# Patient Record
Sex: Female | Born: 1950 | Race: Black or African American | Hispanic: No | Smoking: Never smoker
Health system: Southern US, Community
[De-identification: ages and names within clinical notes are randomized; demographics above are authoritative.]

## PROBLEM LIST (undated history)

## (undated) DIAGNOSIS — E785 Hyperlipidemia, unspecified: Secondary | ICD-10-CM

## (undated) DIAGNOSIS — K219 Gastro-esophageal reflux disease without esophagitis: Secondary | ICD-10-CM

## (undated) DIAGNOSIS — G709 Myoneural disorder, unspecified: Secondary | ICD-10-CM

## (undated) DIAGNOSIS — G4730 Sleep apnea, unspecified: Secondary | ICD-10-CM

## (undated) DIAGNOSIS — E119 Type 2 diabetes mellitus without complications: Secondary | ICD-10-CM

## (undated) DIAGNOSIS — I10 Essential (primary) hypertension: Secondary | ICD-10-CM

## (undated) DIAGNOSIS — R51 Headache: Secondary | ICD-10-CM

## (undated) DIAGNOSIS — Z8719 Personal history of other diseases of the digestive system: Secondary | ICD-10-CM

## (undated) DIAGNOSIS — M1990 Unspecified osteoarthritis, unspecified site: Secondary | ICD-10-CM

## (undated) HISTORY — PX: CHOLECYSTECTOMY: SHX55

## (undated) HISTORY — PX: BACK SURGERY: SHX140

## (undated) HISTORY — DX: Myoneural disorder, unspecified: G70.9

## (undated) HISTORY — PX: COLONOSCOPY: SHX174

## (undated) HISTORY — PX: ABDOMINAL HYSTERECTOMY: SHX81

## (undated) HISTORY — PX: TONSILLECTOMY: SUR1361

## (undated) HISTORY — DX: Hyperlipidemia, unspecified: E78.5

## (undated) HISTORY — PX: OTHER SURGICAL HISTORY: SHX169

---

## 1997-12-12 ENCOUNTER — Inpatient Hospital Stay (HOSPITAL_COMMUNITY): Admission: RE | Admit: 1997-12-12 | Discharge: 1997-12-13 | Payer: Self-pay | Admitting: Obstetrics & Gynecology

## 1999-02-23 ENCOUNTER — Emergency Department (HOSPITAL_COMMUNITY): Admission: EM | Admit: 1999-02-23 | Discharge: 1999-02-23 | Payer: Self-pay | Admitting: Emergency Medicine

## 1999-02-23 ENCOUNTER — Encounter: Payer: Self-pay | Admitting: Surgery

## 2000-10-29 ENCOUNTER — Ambulatory Visit (HOSPITAL_COMMUNITY): Admission: RE | Admit: 2000-10-29 | Discharge: 2000-10-29 | Payer: Self-pay | Admitting: *Deleted

## 2000-11-10 ENCOUNTER — Encounter: Payer: Self-pay | Admitting: Emergency Medicine

## 2000-11-10 ENCOUNTER — Emergency Department (HOSPITAL_COMMUNITY): Admission: EM | Admit: 2000-11-10 | Discharge: 2000-11-10 | Payer: Self-pay | Admitting: Emergency Medicine

## 2001-04-06 ENCOUNTER — Ambulatory Visit (HOSPITAL_BASED_OUTPATIENT_CLINIC_OR_DEPARTMENT_OTHER): Admission: RE | Admit: 2001-04-06 | Discharge: 2001-04-06 | Payer: Self-pay | Admitting: Orthopaedic Surgery

## 2001-05-05 ENCOUNTER — Encounter: Payer: Self-pay | Admitting: Family Medicine

## 2001-05-05 ENCOUNTER — Encounter: Admission: RE | Admit: 2001-05-05 | Discharge: 2001-05-05 | Payer: Self-pay | Admitting: Family Medicine

## 2001-05-26 ENCOUNTER — Encounter: Admission: RE | Admit: 2001-05-26 | Discharge: 2001-08-24 | Payer: Self-pay | Admitting: Family Medicine

## 2001-11-30 ENCOUNTER — Ambulatory Visit (HOSPITAL_BASED_OUTPATIENT_CLINIC_OR_DEPARTMENT_OTHER): Admission: RE | Admit: 2001-11-30 | Discharge: 2001-11-30 | Payer: Self-pay | Admitting: Orthopaedic Surgery

## 2002-09-06 ENCOUNTER — Other Ambulatory Visit: Admission: RE | Admit: 2002-09-06 | Discharge: 2002-09-06 | Payer: Self-pay | Admitting: Family Medicine

## 2002-09-08 ENCOUNTER — Encounter: Payer: Self-pay | Admitting: Family Medicine

## 2002-09-08 ENCOUNTER — Encounter: Admission: RE | Admit: 2002-09-08 | Discharge: 2002-09-08 | Payer: Self-pay | Admitting: Family Medicine

## 2002-11-03 ENCOUNTER — Encounter: Admission: RE | Admit: 2002-11-03 | Discharge: 2002-11-03 | Payer: Self-pay | Admitting: Family Medicine

## 2002-11-03 ENCOUNTER — Encounter: Payer: Self-pay | Admitting: Family Medicine

## 2002-12-20 ENCOUNTER — Encounter: Admission: RE | Admit: 2002-12-20 | Discharge: 2003-02-21 | Payer: Self-pay

## 2003-04-08 ENCOUNTER — Emergency Department (HOSPITAL_COMMUNITY): Admission: EM | Admit: 2003-04-08 | Discharge: 2003-04-08 | Payer: Self-pay | Admitting: Emergency Medicine

## 2003-09-06 ENCOUNTER — Other Ambulatory Visit: Admission: RE | Admit: 2003-09-06 | Discharge: 2003-09-06 | Payer: Self-pay | Admitting: Family Medicine

## 2004-10-25 ENCOUNTER — Other Ambulatory Visit: Admission: RE | Admit: 2004-10-25 | Discharge: 2004-10-25 | Payer: Self-pay | Admitting: Family Medicine

## 2007-02-01 ENCOUNTER — Other Ambulatory Visit: Admission: RE | Admit: 2007-02-01 | Discharge: 2007-02-01 | Payer: Self-pay | Admitting: Family Medicine

## 2007-02-18 ENCOUNTER — Encounter: Admission: RE | Admit: 2007-02-18 | Discharge: 2007-02-18 | Payer: Self-pay | Admitting: Family Medicine

## 2008-03-24 ENCOUNTER — Encounter: Admission: RE | Admit: 2008-03-24 | Discharge: 2008-03-24 | Payer: Self-pay | Admitting: Neurosurgery

## 2008-07-20 ENCOUNTER — Inpatient Hospital Stay (HOSPITAL_COMMUNITY): Admission: RE | Admit: 2008-07-20 | Discharge: 2008-07-23 | Payer: Self-pay | Admitting: Neurosurgery

## 2008-12-26 ENCOUNTER — Encounter: Admission: RE | Admit: 2008-12-26 | Discharge: 2009-03-26 | Payer: Self-pay | Admitting: Neurosurgery

## 2009-07-06 ENCOUNTER — Ambulatory Visit (HOSPITAL_BASED_OUTPATIENT_CLINIC_OR_DEPARTMENT_OTHER): Admission: RE | Admit: 2009-07-06 | Discharge: 2009-07-06 | Payer: Self-pay | Admitting: Ophthalmology

## 2009-08-13 ENCOUNTER — Emergency Department (HOSPITAL_COMMUNITY): Admission: EM | Admit: 2009-08-13 | Discharge: 2009-08-13 | Payer: Self-pay | Admitting: Family Medicine

## 2010-07-07 ENCOUNTER — Encounter: Payer: Self-pay | Admitting: Family Medicine

## 2010-08-20 ENCOUNTER — Other Ambulatory Visit: Payer: Self-pay | Admitting: Internal Medicine

## 2010-08-20 DIAGNOSIS — Z1231 Encounter for screening mammogram for malignant neoplasm of breast: Secondary | ICD-10-CM

## 2010-08-29 ENCOUNTER — Ambulatory Visit: Payer: Self-pay

## 2010-09-30 LAB — CBC
HCT: 32.8 % — ABNORMAL LOW (ref 36.0–46.0)
Hemoglobin: 11.1 g/dL — ABNORMAL LOW (ref 12.0–15.0)
MCHC: 33.9 g/dL (ref 30.0–36.0)
MCV: 88.3 fL (ref 78.0–100.0)
Platelets: 212 10*3/uL (ref 150–400)
RBC: 3.72 MIL/uL — ABNORMAL LOW (ref 3.87–5.11)
RDW: 12.9 % (ref 11.5–15.5)
WBC: 8.2 10*3/uL (ref 4.0–10.5)

## 2010-09-30 LAB — BASIC METABOLIC PANEL
BUN: 23 mg/dL (ref 6–23)
CO2: 25 mEq/L (ref 19–32)
Calcium: 9.7 mg/dL (ref 8.4–10.5)
Chloride: 101 mEq/L (ref 96–112)
Creatinine, Ser: 1.23 mg/dL — ABNORMAL HIGH (ref 0.4–1.2)
GFR calc Af Amer: 55 mL/min — ABNORMAL LOW (ref >60)
GFR calc non Af Amer: 45 mL/min — ABNORMAL LOW (ref >60)
Glucose, Bld: 112 mg/dL — ABNORMAL HIGH (ref 70–99)
Potassium: 4.1 mEq/L (ref 3.5–5.1)
Sodium: 136 mEq/L (ref 135–145)

## 2010-09-30 LAB — TYPE AND SCREEN
ABO/RH(D): B POS
Antibody Screen: NEGATIVE

## 2010-09-30 LAB — ABO/RH: ABO/RH(D): B POS

## 2010-10-01 LAB — GLUCOSE, CAPILLARY
Glucose-Capillary: 115 mg/dL — ABNORMAL HIGH (ref 70–99)
Glucose-Capillary: 118 mg/dL — ABNORMAL HIGH (ref 70–99)
Glucose-Capillary: 127 mg/dL — ABNORMAL HIGH (ref 70–99)
Glucose-Capillary: 131 mg/dL — ABNORMAL HIGH (ref 70–99)
Glucose-Capillary: 136 mg/dL — ABNORMAL HIGH (ref 70–99)
Glucose-Capillary: 150 mg/dL — ABNORMAL HIGH (ref 70–99)
Glucose-Capillary: 169 mg/dL — ABNORMAL HIGH (ref 70–99)
Glucose-Capillary: 182 mg/dL — ABNORMAL HIGH (ref 70–99)
Glucose-Capillary: 66 mg/dL — ABNORMAL LOW (ref 70–99)
Glucose-Capillary: 71 mg/dL (ref 70–99)
Glucose-Capillary: 89 mg/dL (ref 70–99)
Glucose-Capillary: 90 mg/dL (ref 70–99)
Glucose-Capillary: 93 mg/dL (ref 70–99)
Glucose-Capillary: 94 mg/dL (ref 70–99)
Glucose-Capillary: 95 mg/dL (ref 70–99)

## 2010-10-01 LAB — CROSSMATCH
ABO/RH(D): B POS
Antibody Screen: NEGATIVE

## 2010-10-01 LAB — CBC
HCT: 23.1 % — ABNORMAL LOW (ref 36.0–46.0)
HCT: 28.1 % — ABNORMAL LOW (ref 36.0–46.0)
Hemoglobin: 8 g/dL — ABNORMAL LOW (ref 12.0–15.0)
Hemoglobin: 9.6 g/dL — ABNORMAL LOW (ref 12.0–15.0)
MCHC: 34.3 g/dL (ref 30.0–36.0)
MCHC: 34.5 g/dL (ref 30.0–36.0)
MCV: 86.5 fL (ref 78.0–100.0)
MCV: 89.9 fL (ref 78.0–100.0)
Platelets: 117 10*3/uL — ABNORMAL LOW (ref 150–400)
Platelets: 129 10*3/uL — ABNORMAL LOW (ref 150–400)
RBC: 2.58 MIL/uL — ABNORMAL LOW (ref 3.87–5.11)
RBC: 3.26 MIL/uL — ABNORMAL LOW (ref 3.87–5.11)
RDW: 12.9 % (ref 11.5–15.5)
RDW: 15.4 % (ref 11.5–15.5)
WBC: 11.2 10*3/uL — ABNORMAL HIGH (ref 4.0–10.5)
WBC: 6 10*3/uL (ref 4.0–10.5)

## 2010-10-01 LAB — POCT I-STAT 7, (LYTES, BLD GAS, ICA,H+H)
Acid-base deficit: 2 mmol/L (ref 0.0–2.0)
Acid-base deficit: 3 mmol/L — ABNORMAL HIGH (ref 0.0–2.0)
Bicarbonate: 23.1 mEq/L (ref 20.0–24.0)
Bicarbonate: 24 mEq/L (ref 20.0–24.0)
Calcium, Ion: 1.19 mmol/L (ref 1.12–1.32)
Calcium, Ion: 1.25 mmol/L (ref 1.12–1.32)
HCT: 23 % — ABNORMAL LOW (ref 36.0–46.0)
HCT: 23 % — ABNORMAL LOW (ref 36.0–46.0)
Hemoglobin: 7.8 g/dL — CL (ref 12.0–15.0)
Hemoglobin: 7.8 g/dL — CL (ref 12.0–15.0)
O2 Saturation: 100 %
O2 Saturation: 100 %
Patient temperature: 35.9
Patient temperature: 35.9
Potassium: 4.5 mEq/L (ref 3.5–5.1)
Potassium: 4.7 mEq/L (ref 3.5–5.1)
Sodium: 135 mEq/L (ref 135–145)
Sodium: 136 mEq/L (ref 135–145)
TCO2: 24 mmol/L (ref 0–100)
TCO2: 25 mmol/L (ref 0–100)
pCO2 arterial: 41.5 mmHg (ref 35.0–45.0)
pCO2 arterial: 42.4 mmHg (ref 35.0–45.0)
pH, Arterial: 7.349 — ABNORMAL LOW (ref 7.350–7.400)
pH, Arterial: 7.357 (ref 7.350–7.400)
pO2, Arterial: 410 mmHg — ABNORMAL HIGH (ref 80.0–100.0)
pO2, Arterial: 474 mmHg — ABNORMAL HIGH (ref 80.0–100.0)

## 2010-10-01 LAB — POCT I-STAT, CHEM 8
BUN: 31 mg/dL — ABNORMAL HIGH (ref 6–23)
Calcium, Ion: 1.2 mmol/L (ref 1.12–1.32)
Chloride: 105 mEq/L (ref 96–112)
Creatinine, Ser: 1 mg/dL (ref 0.4–1.2)
Glucose, Bld: 133 mg/dL — ABNORMAL HIGH (ref 70–99)
HCT: 20 % — ABNORMAL LOW (ref 36.0–46.0)
Hemoglobin: 6.8 g/dL — CL (ref 12.0–15.0)
Potassium: 4.7 mEq/L (ref 3.5–5.1)
Sodium: 136 mEq/L (ref 135–145)
TCO2: 23 mmol/L (ref 0–100)

## 2010-10-01 LAB — DIFFERENTIAL
Basophils Absolute: 0 10*3/uL (ref 0.0–0.1)
Basophils Relative: 0 % (ref 0–1)
Eosinophils Absolute: 0 10*3/uL (ref 0.0–0.7)
Eosinophils Relative: 0 % (ref 0–5)
Lymphocytes Relative: 17 % (ref 12–46)
Lymphs Abs: 1 10*3/uL (ref 0.7–4.0)
Monocytes Absolute: 0.4 10*3/uL (ref 0.1–1.0)
Monocytes Relative: 6 % (ref 3–12)
Neutro Abs: 4.6 10*3/uL (ref 1.7–7.7)
Neutrophils Relative %: 77 % (ref 43–77)

## 2010-10-29 NOTE — Op Note (Signed)
[name], [name] NO.:  [id number]   MEDICAL RECORD NO.:  [id number]          PATIENT TYPE:  INP   LOCATION:  3016                         FACILITY:  [facility]   PHYSICIAN:  Marchia Meiers. Vertell Limber, M.D.  DATE OF BIRTH:  01-17-1951   DATE OF PROCEDURE:  07/20/2008  DATE OF DISCHARGE:                               OPERATIVE REPORT   PREOPERATIVE DIAGNOSIS:  Herniated lumbar disk with spondylosis,  degenerative disk disease, stenosis and low back pain at L4-5 and L5-S1  levels.   POSTOPERATIVE DIAGNOSIS:  Herniated lumbar disk with spondylosis,  degenerative disk disease, stenosis and low back pain at L4-5 and L5-S1  levels.   PROCEDURES:  Bilateral decompressive laminectomies at L4-5 and L5-S1  levels with posterior lumbar interbody fusion with PEEK cages with bone  morphogenic protein including bone and autograft, segmental pedicle  screw fixation at L4 through S1 bilaterally with posterolateral  arthrodesis.   SURGEON:  Marchia Meiers. Vertell Limber, MD   ASSISTANTS:  Verdis Prime, RN and Otilio Connors, MD   ANESTHESIA:  General endotracheal anesthesia.   ESTIMATED BLOOD LOSS:  750 mL with 300 mL of Cell Saver blood returned  to the patient.   COMPLICATIONS:  None.   DISPOSITION:  Recovery.   INDICATIONS:  [name] is a 60 year old woman with bilateral  lower extremity pain, significant disk degeneration at L4-5 and L5-S1  levels, spondylosis and stenosis.  We had elected to take her to surgery  for decompression and fusion at these affected levels.   PROCEDURE:  Ms. Crutcher was brought to the operating room.  Following  satisfactory and uncomplicated induction of general endotracheal  anesthesia plus intravenous lines and Foley catheter, the patient was  placed in a prone position on the [place] table.  Soft tissue and bony  prominences were padded appropriately.  Her low back was then prepped  and draped in the usual sterile fashion.  The area of planned  incision  was infiltrated with local lidocaine and incision was made in the  midline and carried to the lumbodorsal fascia which was incised  bilaterally using electrocautery and subperiosteal dissection exposing  the L4 and L5 transverse processes in the sacral alae.  Intraoperative x-  ray confirmed correct orientation with marker probes at the sacral alae  L4 and L5 transverse processes.  Subsequently, hemi laminectomies of L4  and L5 were performed bilaterally and spinal canal and L4, L5 and S1  nerve roots were decompressed.  A laminectomy was performed which was  greater than would be atypical for posterior lumbar interbody fusion.  Decompression was carried to the neural foramina to adequately  decompress the nerve roots.  There was a large central herniated disk at  L5-S1 level with significant calcification in the midline but laterally  the disk material was not calcified.  The interspace was incised and  disk material was removed in piecemeal fashion.  Endplates were then  prepared for clip cages and similar diskectomy was then performed at the  L4-5 level.  After stripping endplates of the residual disk and  cartilaginous material, trial sizers were then used 8 mm  at L5-S1 and 10  mm at L4-5 level.  PEEK interbody cage was then selected, packed with a  small BMP and EquivaBone.  Additional BMP was inserted in the  interspace.  Bilateral 8-mm cages were placed at L5-S1 and bilateral 10-  mm cages were placed at the L4-5.  Additional bone autograft and  EquivaBone were then tamped overlying the cages and into the midline and  laterally as well.  A C-arm was then brought into the field and using AP  and lateral C-arm fluoroscopy, pedicle screws were placed with 40 x 6.5  mm sacral screws at S1, 40 x 5.5 mm at L5 and 45 x 5.5 mm at L4.  All  screws had excellent purchase.  No evidence of any cutouts.  Final  radiographs demonstrated well-positioned cages and pedicle screw   fixation.  The wound was irrigated.  The posterolateral region was  decorticated.  On the left side, the remaining BMP and EquivaBone with  autograft was then placed and on the right side bone autograft was  placed.  The 60-mm prelordosed rods were placed and locked down in situ.  The nerve roots were inspected and found to be in good repair.  No  evidence of any CSF leakage nor evidence of any residual compression of  the nerve root.  Subsequently, the self-retaining retractor was removed.  The lumbodorsal fascia was closed with #1 Vicryl sutures, subcutaneous  tissues were reapproximated with 3-0 Vicryl interrupted inverted sutures  and skin edges were reapproximated with interrupted 3-0 Vicryl  subcuticular stitch.  Wound was dressed with benzoin, Steri-Strips,  Telfa gauze and tape.  The patient was extubated in the operating room  and taken to the recovery room in stable satisfactory condition having  tolerated the operation well.  Counts were correct at the end of the  case.      Marchia Meiers. Vertell Limber, M.D.  Electronically Signed     JDS/MEDQ  D:  07/20/2008  T:  07/21/2008  Job:  [id number]

## 2010-10-29 NOTE — Discharge Summary (Signed)
NAMELYNE, MAFFEO NO.:  [id number]   MEDICAL RECORD NO.:  [id number]          PATIENT TYPE:  INP   LOCATION:  3016                         FACILITY:  [facility]   PHYSICIAN:  Marchia Meiers. Vertell Limber, M.D.  DATE OF BIRTH:  15-May-1951   DATE OF ADMISSION:  07/20/2008  DATE OF DISCHARGE:  07/23/2008                               DISCHARGE SUMMARY   REASON FOR ADMISSION:  Herniated lumbar disk with spinal stenosis,  spondylosis, lumbar radiculopathy along with comorbidities of morbid  obesity and type 2 diabetes.   HISTORY OF ILLNESS AND HOSPITAL COURSE:  Daja Scanlon is a 60-year-  old woman who has undergone extensive nonsurgical treatment for lumbar  spondylosis and bilateral lower extremity pain.  She has significant  spondylosis and disk generation with disk herniation and lateral  foraminal stenosis resulting in significant low back and bilateral lower  extremity pain.  We elected to take her to Surgery on July 20, 2008,  for decompression at the L3 through S1 level.  She did well with that  and surgery was uncomplicated.  She did have some hypotension  postoperatively, which responded with fluid boluses, however; hemoglobin  was low and she was dizzy when standing, was therefore elected for 2  units of packed red blood cells for transfusion for anemia.  She  responded to that and had a post transfusion hematocrit of 28.  She was  able to mobilize and was doing better with physical therapy.  Diabetes  was under control and she was discharged home on July 23, 2008, in  stable condition having tolerated the operation and hospitalization  well.   DISCHARGE MEDICATIONS:  Her home medications.  In addition, hydrocodone  and Flexeril.  She was instructed to follow up in our office in 3 weeks  postoperatively for a followup.      Marchia Meiers. Vertell Limber, M.D.  Electronically Signed     JDS/MEDQ  D:  07/23/2008  T:  07/24/2008  Job:  [id number]

## 2010-11-01 NOTE — Discharge Summary (Signed)
[facility]  Patient:    [name], [name] Visit Number: [id number] MRN: [id number]          Service Type: DSU Location: [facility] Attending Physician:  Melrose Nakayama Admit Date:  04/06/2001 Discharge Date: 04/06/2001                             Discharge Summary  DISCHARGE DIAGNOSES:          1. Menorrhagia.                               2. Stress urinary incontinence.  OPERATIONS AND PROCEDURES:    1. Total vaginal hysterectomy.                               2. Anterior repair with [facility]                                  colpopexy.                               3. Aspiration of a right ovarian                                  cyst.  HISTORY OF PRESENT ILLNESS:   The patient is a 60 year old, gravida 4, para 3, who complained of a heavy menstrual cycle for the past two years.  She has also had mild stress urinary incontinence.  HOSPITAL COURSE:              She had an uneventful total vaginal hysterectomy and anterior repair with [facility] colpopexy on December 12, 1997.  She was also noted to have a right ovarian cyst and aspiration of the cyst fluid was performed.  The hospital course was essentially unremarkable.  On postoperative day #1, she was voiding well, tolerating a regular diet, and ambulating well without difficulty.  Vaginal bleeding was minimal.  The patient was afebrile.  At this point, there were no contraindications to her being discharged.  DISCHARGE LABORATORY DATA:    Hemoglobin 10.0, hematocrit 30.1, platelets 165, white count 7.6.  Pathology showed cervix with squamous metaplasia, proliferative endometrium with no evidence of hyperplasia or malignancy, adenomyosis of the myometrium, and fibrosis adhesions of the serosa.  The right ovarian cyst aspiration was benign.  DIET:                         As tolerated.  DISCHARGE INSTRUCTIONS:       Instructions as per the [facility] discharge instruction sheet.   Follow-up will be in six weeks at [place]. Attending Physician:  Melrose Nakayama DD:  12/26/97 TD:  12/26/97 Job: [id number]

## 2010-11-01 NOTE — Op Note (Signed)
. [facility]  Patient:    [name], [name] Visit Number: [id number] MRN: [id number]          Service Type: DSU Location: [facility] Attending Physician:  Melrose Nakayama Dictated by:   Monico Blitz. Rhona Raider, M.D. Proc. Date: 11/30/01 Admit Date:  11/30/2001                             Operative Report  PREOPERATIVE DIAGNOSIS:  Left shoulder acromioclavicular pain.  POSTOPERATIVE DIAGNOSIS:  Left shoulder acromioclavicular pain.  PROCEDURE:  Left shoulder open acromioclavicular resection.  SURGEON:  Monico Blitz. Rhona Raider, M.D.  ASSISTANT:  Dione Housekeeper, P.A.  ANESTHESIA:  General and block.  INDICATION FOR PROCEDURE:   The patient is a 60 year old woman many months out from a left shoulder arthroscopy at which point an acromioplasty and AC resection was performed.  She has persisted with pain directly at the [facility] joint despite physical therapy, rest, and an injection.  Her pain is always at this spot and at this point, she was offered an open [facility] resection.  Postoperative films have shown about a cm between the distal clavicle and acromion.  She underwent a MRI scan, which showed continued inflammation about the end of her clavicle, but no rotator cuff difficulty.  The procedure was discussed with the patient and informed operative consent was obtained after discussion of possible complications of reaction to anesthesia and infection.  DESCRIPTION OF PROCEDURE:  The patient was taken to the operating suite, where general anesthetic was applied without difficulty.  She was also given a block in the preanesthesia area.  She was positioned in beach chair position and prepped and draped in normal sterile fashion.  After the administration of preoperative IV antibiotics, a small incision was made longitudinally over the [facility] joint.  Dissection was carried down to the fascia over this joint, which was incised longitudinally and dissected anterior and  posterior in a continuous sleeve.  About another cm of the distal clavicle was removed with an oscillating saw.  A rasp was used to smooth the cut surfaces.  The wound was irrigated, followed by reapproximation of the fascial layer over the distal clavicle and AC area.  The wound again was irrigated, followed by reapproximation of subcutaneous tissues with 2-0 undyed Vicryl and the skin with a running subcuticular stitch.  Adaptic was applied to the wound, followed by dry gauze and tape.  Estimated blood loss and intraoperative fluids can be obtained from anesthesia records.  DISPOSITION:  The patient was extubated in the operating room and taken to recovery room in stable condition.  PLANS:  Were for her to go home the same day and follow up in the office in less than a week.  I will contact her by phone tonight. Dictated by:   Monico Blitz Rhona Raider, M.D. Attending Physician:  Melrose Nakayama DD:  11/30/01 TD:  12/01/01 Job: [id number]

## 2010-11-01 NOTE — Op Note (Signed)
[facility]  Patient:    [name], [name] Visit Number: [id number] MRN: [id number]          Service Type: DSU Location: [facility] Attending Physician:  Melrose Nakayama Dictated by:   Monico Blitz. Rhona Raider, M.D. Proc. Date: 04/06/01 Admit Date:  04/06/2001                             Operative Report  PREOPERATIVE DIAGNOSES: 1. Left shoulder impingement. 2. Left shoulder acromioclavicular pain.  POSTOPERATIVE DIAGNOSES: 1. Left shoulder impingement. 2. Left shoulder acromioclavicular pain.  PROCEDURES: 1. Left shoulder arthroscopic acromioplasty. 2. Left shoulder arthroscopic acromioclavicular resection.  ANESTHESIA:  General.  SURGEON:  Monico Blitz. Rhona Raider, M.D.  ASSISTANT:  Dione Housekeeper, P.A.  INDICATION FOR PROCEDURE:  The patient is a 60 year old woman with about five or six months of left shoulder pain after an accident.  She did have a far distal clavicle fracture.  This has persisted despite conservative measures, and she has undergone an MRI scan, which does show some significant improvement.  She is offered an arthroscopy.  The procedure was discussed with the patient and informed operative consent was obtained after discussion of the possible complications of, reaction to anesthesia, infection, and shoulder stiffness.  DESCRIPTION OF PROCEDURE:  The patient was taken to the operating suite, where a general anesthetic was applied without difficulty.  She was positioned in the beach chair position and prepped and draped in normal sterile fashion. After the administration of preop IV antibiotics, an arthroscopy of the left shoulder was performed through a total of three portals.  The glenohumeral joint showed no degenerative change, and all labral structures including the biceps tendon were well attached.  The rotator cuff appeared with some partial tearing on the undersurface but no significant tear.  the subacromial space was  entered.  She had a great deal of bursitis, addressed with a thorough bursectomy.  I could find no cuff tear from above.  She had significant impingement, and this was addressed with an acromioplasty back to a flat surface.  This was done with the bur in the lateral position, followed by transfer of the bur to the posterior position.  AC joint had bone-on-bone contact and was addressed with a decompression through the third portal. About a centimeter of space was created between the bones at this joint.  The shoulder was thoroughly irrigated, followed by the placement of Marcaine wit epinephrine and morphine.  Simple sutures of nylon used to loosely reapproximate the portals, followed by Adaptic and dry gauze with tape. Estimated blood loss and intraoperative fluids can be obtained from anesthesia records.  DISPOSITION:  The patient was extubated in the operating room and taken to the recovery room in stable condition.  Plans were for her to go home the same day and follow up in the office in less than a week.  I will contact her by phone tonight. Dictated by:   Monico Blitz Rhona Raider, M.D. Attending Physician:  Melrose Nakayama DD:  04/06/01 TD:  04/07/01 Job: [id number]

## 2010-12-19 ENCOUNTER — Other Ambulatory Visit (HOSPITAL_COMMUNITY): Payer: Self-pay | Admitting: Internal Medicine

## 2010-12-19 DIAGNOSIS — R1012 Left upper quadrant pain: Secondary | ICD-10-CM

## 2010-12-24 ENCOUNTER — Ambulatory Visit (HOSPITAL_COMMUNITY)
Admission: RE | Admit: 2010-12-24 | Discharge: 2010-12-24 | Disposition: A | Discharge status: Home / Self Care | Payer: Medicaid Other | Source: Ambulatory Visit | Attending: Internal Medicine | Admitting: Internal Medicine

## 2010-12-24 DIAGNOSIS — R109 Unspecified abdominal pain: Secondary | ICD-10-CM | POA: Insufficient documentation

## 2010-12-24 DIAGNOSIS — R1012 Left upper quadrant pain: Secondary | ICD-10-CM

## 2010-12-24 DIAGNOSIS — K7689 Other specified diseases of liver: Secondary | ICD-10-CM | POA: Insufficient documentation

## 2010-12-24 LAB — BUN: BUN: 29 mg/dL — ABNORMAL HIGH (ref 6–23)

## 2010-12-24 LAB — CREATININE, SERUM
Creatinine, Ser: 1.04 mg/dL (ref 0.50–1.10)
GFR calc Af Amer: >60 mL/min (ref >60)
GFR calc non Af Amer: 55 mL/min — ABNORMAL LOW (ref >60)

## 2010-12-24 MED ORDER — IOHEXOL 300 MG/ML  SOLN
100.0000 mL | Freq: Once | INTRAMUSCULAR | Status: AC | PRN
  Administered 2010-12-24: 100 mL via INTRAVENOUS

## 2012-08-16 ENCOUNTER — Encounter (HOSPITAL_COMMUNITY): Payer: Self-pay | Admitting: Pharmacy Technician

## 2012-08-17 ENCOUNTER — Other Ambulatory Visit (HOSPITAL_COMMUNITY): Payer: Self-pay | Admitting: Orthopedic Surgery

## 2012-08-18 ENCOUNTER — Encounter (HOSPITAL_COMMUNITY): Payer: Self-pay

## 2012-08-18 ENCOUNTER — Encounter (HOSPITAL_COMMUNITY)
Admission: RE | Admit: 2012-08-18 | Discharge: 2012-08-18 | Disposition: A | Discharge status: Home / Self Care | Payer: Medicare Other | Source: Ambulatory Visit | Attending: Orthopedic Surgery | Admitting: Orthopedic Surgery

## 2012-08-18 ENCOUNTER — Ambulatory Visit (HOSPITAL_COMMUNITY)
Admission: RE | Admit: 2012-08-18 | Discharge: 2012-08-18 | Disposition: A | Discharge status: Home / Self Care | Payer: Medicare Other | Source: Ambulatory Visit | Attending: Orthopedic Surgery | Admitting: Orthopedic Surgery

## 2012-08-18 DIAGNOSIS — E119 Type 2 diabetes mellitus without complications: Secondary | ICD-10-CM | POA: Insufficient documentation

## 2012-08-18 DIAGNOSIS — Z01818 Encounter for other preprocedural examination: Secondary | ICD-10-CM | POA: Insufficient documentation

## 2012-08-18 DIAGNOSIS — Z01812 Encounter for preprocedural laboratory examination: Secondary | ICD-10-CM | POA: Insufficient documentation

## 2012-08-18 DIAGNOSIS — I10 Essential (primary) hypertension: Secondary | ICD-10-CM | POA: Insufficient documentation

## 2012-08-18 HISTORY — DX: Essential (primary) hypertension: I10

## 2012-08-18 HISTORY — DX: Headache: R51

## 2012-08-18 HISTORY — DX: Unspecified osteoarthritis, unspecified site: M19.90

## 2012-08-18 HISTORY — DX: Personal history of other diseases of the digestive system: Z87.19

## 2012-08-18 HISTORY — DX: Gastro-esophageal reflux disease without esophagitis: K21.9

## 2012-08-18 HISTORY — DX: Sleep apnea, unspecified: G47.30

## 2012-08-18 HISTORY — DX: Type 2 diabetes mellitus without complications: E11.9

## 2012-08-18 LAB — COMPREHENSIVE METABOLIC PANEL
ALT: 31 U/L (ref 0–35)
AST: 17 U/L (ref 0–37)
Albumin: 3.5 g/dL (ref 3.5–5.2)
Alkaline Phosphatase: 94 U/L (ref 39–117)
BUN: 17 mg/dL (ref 6–23)
CO2: 27 mEq/L (ref 19–32)
Calcium: 9.6 mg/dL (ref 8.4–10.5)
Chloride: 97 mEq/L (ref 96–112)
Creatinine, Ser: 0.96 mg/dL (ref 0.50–1.10)
GFR calc Af Amer: 74 mL/min — ABNORMAL LOW (ref >90)
GFR calc non Af Amer: 63 mL/min — ABNORMAL LOW (ref >90)
Glucose, Bld: 155 mg/dL — ABNORMAL HIGH (ref 70–99)
Potassium: 4 mEq/L (ref 3.5–5.1)
Sodium: 134 mEq/L — ABNORMAL LOW (ref 135–145)
Total Bilirubin: 0.5 mg/dL (ref 0.3–1.2)
Total Protein: 7.1 g/dL (ref 6.0–8.3)

## 2012-08-18 LAB — CBC
HCT: 38.2 % (ref 36.0–46.0)
Hemoglobin: 12.5 g/dL (ref 12.0–15.0)
MCH: 27.4 pg (ref 26.0–34.0)
MCHC: 32.7 g/dL (ref 30.0–36.0)
MCV: 83.8 fL (ref 78.0–100.0)
Platelets: 201 10*3/uL (ref 150–400)
RBC: 4.56 MIL/uL (ref 3.87–5.11)
RDW: 13.2 % (ref 11.5–15.5)
WBC: 7.1 10*3/uL (ref 4.0–10.5)

## 2012-08-18 LAB — APTT: aPTT: 30 seconds (ref 24–37)

## 2012-08-18 LAB — PROTIME-INR
INR: 0.94 (ref 0.00–1.49)
Prothrombin Time: 12.5 seconds (ref 11.6–15.2)

## 2012-08-18 LAB — URINALYSIS, ROUTINE W REFLEX MICROSCOPIC
Bilirubin Urine: NEGATIVE
Glucose, UA: NEGATIVE mg/dL
Hgb urine dipstick: NEGATIVE
Ketones, ur: NEGATIVE mg/dL
Leukocytes, UA: NEGATIVE
Nitrite: NEGATIVE
Protein, ur: NEGATIVE mg/dL
Specific Gravity, Urine: 1.015 (ref 1.005–1.030)
Urobilinogen, UA: 0.2 mg/dL (ref 0.0–1.0)
pH: 6.5 (ref 5.0–8.0)

## 2012-08-18 LAB — SURGICAL PCR SCREEN
MRSA, PCR: NEGATIVE
Staphylococcus aureus: NEGATIVE

## 2012-08-18 NOTE — Patient Instructions (Signed)
20      Your procedure is scheduled on:  Wednesday 08/25/2012  Report to [facility] at  62 AM.  Call this number if you have problems the morning of surgery: [phone]   Remember:             IF YOU USE CPAP,BRING MASK AND TUBING AM OF SURGERY!   Do not eat food or drink liquids AFTER MIDNIGHT!  Take these medicines the morning of surgery with A SIP OF WATER: Norvasc,Prilosec   Do not bring valuables to the hospital.  .  Leave suitcase in the car. After surgery it may be brought to your room.  For patients admitted to the hospital, checkout time is 11:00 AM the day of              Discharge.    DO NOT WEAR JEWELRY , MAKE-UP, LOTIONS,POWDERS,PERFUMES!             WOMEN -DO NOT SHAVE LEGS OR UNDERARMS 12 HRS. BEFORE                       SURGERY!               MEN MAY SHAVE AS USUAL!             CONTACTS,DENTURES OR BRIDGEWORK, FALSE EYELASHES MAY                    NOT BE WORN INTO SURGERY!                                           Patients discharged the day of surgery will not be allowed to drive home.If going home the same day of surgery, must have someone stay with you first 24 hrs.at home and arrange for someone to drive you home from the [place] [id number]   Special Instructions:             Please read over the following fact sheets that you were given:             1. [place]              2.MRSA INFORMATION              Dearing.Quenten Nawaz,RN,BSN     [id number]                FAILURE TO FOLLOW THESE INSTRUCTIONS MAY RESULT IN CANCELLATION OF YOUR SURGERY!               Patient Signature:___________________________

## 2012-08-18 NOTE — Progress Notes (Signed)
EKG from [facility] 04/08/2012 and Echocardiogram from [place] 05/07/2012 on chart.

## 2012-08-19 ENCOUNTER — Ambulatory Visit: Payer: Medicaid Other | Admitting: *Deleted

## 2012-08-24 NOTE — H&P (Signed)
[name] is an 62 y.o. female.   Chief Complaint: right shoulder pain HPI: [name] presented with the chief complaint of right shoulder pain. She reports she has not had any known injury. She has had the pain for the past five months. She does have a history of a bilateral rotator cuff repair. She said the right shoulder had surgery about 12 years ago. She has had no issues since then until five months ago. Once again, no known injury. 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 She occasionally has some symptoms on the left side but this is more recent. She feels like it is from compensating with the right shoulder. No numbness or tingling in the arms. No neck pain. She has not been taking anti-inflammatories. She has been icing her shoulder occasionally. This has not helped. She had the shoulder injected with cortisone which did not help. MRI showed a 70% tear of the rotator cuff on the right.   Past Medical History  Diagnosis Date  . Hypertension   . Diabetes mellitus without complication   . Sleep apnea     recently diagnosed  . Arthritis   . Headache   . H/O hiatal hernia   . GERD (gastroesophageal reflux disease)     Past Surgical History  Procedure Laterality Date  . Tonsillectomy    . Abdominal hysterectomy      partial  . Cholecystectomy    . Left shoulder      x 2 surgeries  . Right shoulder    . Back surgery      Social History:  reports that  drinks alcohol. She reports that she does not use illicit drugs. Her tobacco history is not on file.  Allergies: No Known Allergies   Current outpatient prescriptions amLODipine (NORVASC) 5 MG tablet, Take 5 mg by mouth daily., Disp: , Rfl: ;   cholecalciferol (VITAMIN D) 1000 UNITS tablet, Take 1,000 Units by mouth daily., Disp: , Rfl: ;  glimepiride (AMARYL) 2 MG tablet, Take 2 mg by mouth daily before  breakfast., Disp: , Rfl: ;   ibuprofen (ADVIL,MOTRIN) 200 MG tablet, Take 200 mg by mouth every 6 (six) hours as needed for pain., Disp: , Rfl:  Liraglutide (VICTOZA) 18 MG/3ML SOLN injection, Inject 1.2 mg into the skin daily after breakfast. , Disp: , Rfl: ;   omeprazole (PRILOSEC) 20 MG capsule, Take 20 mg by mouth daily., Disp: , Rfl:   Review of Systems  Constitutional: Negative.   HENT: Negative for hearing loss, ear pain, nosebleeds, congestion, sore throat, neck pain, tinnitus and ear discharge.   Eyes: Negative.   Respiratory: Negative.  Negative for stridor.   Cardiovascular: Negative.   Gastrointestinal: Negative.   Genitourinary: Negative.   Musculoskeletal: Positive for joint pain. Negative for myalgias, back pain and falls.       Right shoulder pain  Skin: Negative.   Neurological: Positive for headaches. Negative for dizziness, tingling, tremors, sensory change and loss of consciousness.  Endo/Heme/Allergies: Negative.   Psychiatric/Behavioral: Negative.     There were no vitals taken for this visit. Physical Exam  Constitutional: She is oriented to person, place, and time. She appears well-developed and well-nourished. No distress.  HENT:  Head: Normocephalic and atraumatic.  Right Ear: External ear normal.  Left Ear: External ear normal.  Nose: Nose normal.  Mouth/Throat: Oropharynx  is clear and moist.  Eyes: Conjunctivae and EOM are normal.  Neck: Normal range of motion. Neck supple. No tracheal deviation present. No thyromegaly present.  Cardiovascular: Regular rhythm, normal heart sounds and intact distal pulses.   No murmur heard. Respiratory: Effort normal. No respiratory distress. She has no wheezes. She exhibits no tenderness.  GI: Soft. Bowel sounds are normal. She exhibits no distension and no mass. There is no tenderness.  Musculoskeletal:       Right shoulder: She exhibits decreased range of motion, tenderness, pain and decreased strength.       Left  shoulder: Normal.       Right elbow: Normal.      Left elbow: Normal.  Lymphadenopathy:    She has no cervical adenopathy.  Neurological: She is alert and oriented to person, place, and time. She has normal reflexes. No sensory deficit.  Skin: No rash noted. She is not diaphoretic. No erythema.  Psychiatric: She has a normal mood and affect. Her behavior is normal.     Assessment/Plan Right shoulder, rotator cuff tear In reviewing her studies and reviewing her clinical findings she needs to have an open repair of the rotator cuff on the right. She may need a graft and she may need anchors. Complications are rare such as infection as she will be on antibiotics. She has to be in the hospital overnight, we have to put her to sleep. She will go home with a sling in place.  [name] [name] 08/24/2012, 9:55 AM

## 2012-08-25 ENCOUNTER — Encounter (HOSPITAL_COMMUNITY): Payer: Self-pay | Admitting: *Deleted

## 2012-08-25 ENCOUNTER — Encounter (HOSPITAL_COMMUNITY)
Admission: RE | Disposition: A | Discharge status: Home / Self Care | Payer: Self-pay | Source: Ambulatory Visit | Attending: Orthopedic Surgery

## 2012-08-25 ENCOUNTER — Ambulatory Visit (HOSPITAL_COMMUNITY): Payer: Medicare Other | Admitting: Anesthesiology

## 2012-08-25 ENCOUNTER — Encounter (HOSPITAL_COMMUNITY): Payer: Self-pay | Admitting: Anesthesiology

## 2012-08-25 ENCOUNTER — Observation Stay (HOSPITAL_COMMUNITY)
Admission: RE | Admit: 2012-08-25 | Discharge: 2012-08-26 | Disposition: A | Discharge status: Home / Self Care | Payer: Medicare Other | Source: Ambulatory Visit | Attending: Orthopedic Surgery | Admitting: Orthopedic Surgery

## 2012-08-25 DIAGNOSIS — G4730 Sleep apnea, unspecified: Secondary | ICD-10-CM | POA: Insufficient documentation

## 2012-08-25 DIAGNOSIS — M67919 Unspecified disorder of synovium and tendon, unspecified shoulder: Secondary | ICD-10-CM | POA: Insufficient documentation

## 2012-08-25 DIAGNOSIS — M75100 Unspecified rotator cuff tear or rupture of unspecified shoulder, not specified as traumatic: Secondary | ICD-10-CM | POA: Diagnosis present

## 2012-08-25 DIAGNOSIS — K219 Gastro-esophageal reflux disease without esophagitis: Secondary | ICD-10-CM | POA: Insufficient documentation

## 2012-08-25 DIAGNOSIS — E119 Type 2 diabetes mellitus without complications: Secondary | ICD-10-CM | POA: Insufficient documentation

## 2012-08-25 DIAGNOSIS — M25819 Other specified joint disorders, unspecified shoulder: Principal | ICD-10-CM | POA: Insufficient documentation

## 2012-08-25 DIAGNOSIS — M75101 Unspecified rotator cuff tear or rupture of right shoulder, not specified as traumatic: Secondary | ICD-10-CM

## 2012-08-25 DIAGNOSIS — Z79899 Other long term (current) drug therapy: Secondary | ICD-10-CM | POA: Insufficient documentation

## 2012-08-25 DIAGNOSIS — I10 Essential (primary) hypertension: Secondary | ICD-10-CM | POA: Insufficient documentation

## 2012-08-25 DIAGNOSIS — M719 Bursopathy, unspecified: Secondary | ICD-10-CM | POA: Insufficient documentation

## 2012-08-25 DIAGNOSIS — R51 Headache: Secondary | ICD-10-CM | POA: Insufficient documentation

## 2012-08-25 HISTORY — PX: SHOULDER OPEN ROTATOR CUFF REPAIR: SHX2407

## 2012-08-25 LAB — GLUCOSE, CAPILLARY
Glucose-Capillary: 128 mg/dL — ABNORMAL HIGH (ref 70–99)
Glucose-Capillary: 178 mg/dL — ABNORMAL HIGH (ref 70–99)
Glucose-Capillary: 189 mg/dL — ABNORMAL HIGH (ref 70–99)
Glucose-Capillary: 218 mg/dL — ABNORMAL HIGH (ref 70–99)

## 2012-08-25 SURGERY — REPAIR, ROTATOR CUFF, OPEN
Anesthesia: General | Site: Shoulder | Laterality: Right | Wound class: Clean

## 2012-08-25 MED ORDER — PHENYLEPHRINE HCL 10 MG/ML IJ SOLN
INTRAMUSCULAR | Status: DC | PRN
  Administered 2012-08-25: 80 ug via INTRAVENOUS
  Administered 2012-08-25 (×2): 40 ug via INTRAVENOUS

## 2012-08-25 MED ORDER — HYDROMORPHONE HCL PF 1 MG/ML IJ SOLN
INTRAMUSCULAR | Status: DC | PRN
  Administered 2012-08-25 (×2): 0.5 mg via INTRAVENOUS
  Administered 2012-08-25: 1 mg via INTRAVENOUS

## 2012-08-25 MED ORDER — POLYETHYLENE GLYCOL 3350 17 G PO PACK: 17.0000 g | PACK | Freq: Every day | ORAL | Status: DC | PRN

## 2012-08-25 MED ORDER — FENTANYL CITRATE 0.05 MG/ML IJ SOLN
INTRAMUSCULAR | Status: DC | PRN
  Administered 2012-08-25 (×2): 50 ug via INTRAVENOUS
  Administered 2012-08-25: 100 ug via INTRAVENOUS
  Administered 2012-08-25: 50 ug via INTRAVENOUS

## 2012-08-25 MED ORDER — SUCCINYLCHOLINE CHLORIDE 20 MG/ML IJ SOLN
INTRAMUSCULAR | Status: DC | PRN
  Administered 2012-08-25: 100 mg via INTRAVENOUS

## 2012-08-25 MED ORDER — ACETAMINOPHEN 10 MG/ML IV SOLN
INTRAVENOUS | Status: DC | PRN
  Administered 2012-08-25: 1000 mg via INTRAVENOUS

## 2012-08-25 MED ORDER — BUPIVACAINE LIPOSOME 1.3 % IJ SUSP
INTRAMUSCULAR | Status: DC | PRN
  Administered 2012-08-25: 20 mL

## 2012-08-25 MED ORDER — PANTOPRAZOLE SODIUM 40 MG PO TBEC
40.0000 mg | DELAYED_RELEASE_TABLET | Freq: Every day | ORAL | Status: DC
  Administered 2012-08-25 – 2012-08-26 (×2): 40 mg via ORAL
  Filled 2012-08-25 (×2): qty 1

## 2012-08-25 MED ORDER — METOCLOPRAMIDE HCL 10 MG PO TABS: 5.0000 mg | ORAL_TABLET | Freq: Four times a day (QID) | ORAL | Status: DC | PRN

## 2012-08-25 MED ORDER — BUPIVACAINE LIPOSOME 1.3 % IJ SUSP
20.0000 mL | Freq: Once | INTRAMUSCULAR | Status: DC
  Filled 2012-08-25: qty 20

## 2012-08-25 MED ORDER — ROCURONIUM BROMIDE 100 MG/10ML IV SOLN
INTRAVENOUS | Status: DC | PRN
  Administered 2012-08-25: 5 mg via INTRAVENOUS

## 2012-08-25 MED ORDER — LACTATED RINGERS IV SOLN
INTRAVENOUS | Status: DC
  Administered 2012-08-25: 22:00:00 via INTRAVENOUS

## 2012-08-25 MED ORDER — THROMBIN 5000 UNITS EX SOLR
CUTANEOUS | Status: AC
  Filled 2012-08-25: qty 5000

## 2012-08-25 MED ORDER — SODIUM CHLORIDE 0.9 % IR SOLN
Status: DC | PRN
  Administered 2012-08-25: 14:00:00

## 2012-08-25 MED ORDER — PROPOFOL 10 MG/ML IV BOLUS
INTRAVENOUS | Status: DC | PRN
  Administered 2012-08-25: 100 mg via INTRAVENOUS

## 2012-08-25 MED ORDER — GLIMEPIRIDE 2 MG PO TABS
2.0000 mg | ORAL_TABLET | Freq: Every day | ORAL | Status: DC
  Administered 2012-08-26: 2 mg via ORAL
  Filled 2012-08-25 (×2): qty 1

## 2012-08-25 MED ORDER — LIDOCAINE HCL (CARDIAC) 20 MG/ML IV SOLN
INTRAVENOUS | Status: DC | PRN
  Administered 2012-08-25: 50 mg via INTRAVENOUS

## 2012-08-25 MED ORDER — LIRAGLUTIDE 18 MG/3ML ~~LOC~~ SOLN
1.2000 mg | Freq: Every day | SUBCUTANEOUS | Status: DC
  Filled 2012-08-25 (×7): qty 0.2

## 2012-08-25 MED ORDER — CEFAZOLIN SODIUM-DEXTROSE 2-3 GM-% IV SOLR
2.0000 g | INTRAVENOUS | Status: AC
  Administered 2012-08-25: 2 g via INTRAVENOUS

## 2012-08-25 MED ORDER — KETOROLAC TROMETHAMINE 30 MG/ML IJ SOLN: 15.0000 mg | Freq: Once | INTRAMUSCULAR | Status: DC | PRN

## 2012-08-25 MED ORDER — METHOCARBAMOL 100 MG/ML IJ SOLN: 500.0000 mg | Freq: Four times a day (QID) | INTRAVENOUS | Status: DC | PRN

## 2012-08-25 MED ORDER — ACETAMINOPHEN 325 MG PO TABS: 650.0000 mg | ORAL_TABLET | Freq: Four times a day (QID) | ORAL | Status: DC | PRN

## 2012-08-25 MED ORDER — BISACODYL 10 MG RE SUPP: 10.0000 mg | Freq: Every day | RECTAL | Status: DC | PRN

## 2012-08-25 MED ORDER — HYDROMORPHONE HCL PF 1 MG/ML IJ SOLN
0.5000 mg | INTRAMUSCULAR | Status: DC | PRN
  Administered 2012-08-25: 1 mg via INTRAVENOUS
  Filled 2012-08-25: qty 1

## 2012-08-25 MED ORDER — PHENOL 1.4 % MT LIQD: 1.0000 | OROMUCOSAL | Status: DC | PRN

## 2012-08-25 MED ORDER — METOCLOPRAMIDE HCL 5 MG/ML IJ SOLN
5.0000 mg | Freq: Four times a day (QID) | INTRAMUSCULAR | Status: DC | PRN
  Filled 2012-08-25: qty 2

## 2012-08-25 MED ORDER — MIDAZOLAM HCL 5 MG/5ML IJ SOLN
INTRAMUSCULAR | Status: DC | PRN
  Administered 2012-08-25: 2 mg via INTRAVENOUS

## 2012-08-25 MED ORDER — HYDROCODONE-ACETAMINOPHEN 5-325 MG PO TABS
1.0000 | ORAL_TABLET | ORAL | Status: DC | PRN
  Administered 2012-08-26 (×2): 1 via ORAL
  Administered 2012-08-26: 2 via ORAL
  Filled 2012-08-25 (×3): qty 1
  Filled 2012-08-25: qty 2

## 2012-08-25 MED ORDER — CEFAZOLIN SODIUM 1-5 GM-% IV SOLN
1.0000 g | Freq: Four times a day (QID) | INTRAVENOUS | Status: AC
  Administered 2012-08-25 – 2012-08-26 (×3): 1 g via INTRAVENOUS
  Filled 2012-08-25 (×3): qty 50

## 2012-08-25 MED ORDER — THROMBIN 5000 UNITS EX SOLR
CUTANEOUS | Status: DC | PRN
  Administered 2012-08-25: 5000 [IU] via TOPICAL

## 2012-08-25 MED ORDER — MENTHOL 3 MG MT LOZG
1.0000 | LOZENGE | OROMUCOSAL | Status: DC | PRN
  Filled 2012-08-25: qty 9

## 2012-08-25 MED ORDER — FLEET ENEMA 7-19 GM/118ML RE ENEM: 1.0000 | ENEMA | Freq: Once | RECTAL | Status: AC | PRN

## 2012-08-25 MED ORDER — ONDANSETRON HCL 4 MG PO TABS: 4.0000 mg | ORAL_TABLET | Freq: Four times a day (QID) | ORAL | Status: DC | PRN

## 2012-08-25 MED ORDER — ACETAMINOPHEN 650 MG RE SUPP: 650.0000 mg | Freq: Four times a day (QID) | RECTAL | Status: DC | PRN

## 2012-08-25 MED ORDER — INSULIN ASPART 100 UNIT/ML ~~LOC~~ SOLN
0.0000 [IU] | Freq: Three times a day (TID) | SUBCUTANEOUS | Status: DC
  Administered 2012-08-26: 3 [IU] via SUBCUTANEOUS

## 2012-08-25 MED ORDER — LACTATED RINGERS IV SOLN
INTRAVENOUS | Status: DC
  Administered 2012-08-25: 1000 mL via INTRAVENOUS
  Administered 2012-08-25: 15:00:00 via INTRAVENOUS

## 2012-08-25 MED ORDER — CEFAZOLIN SODIUM-DEXTROSE 2-3 GM-% IV SOLR
INTRAVENOUS | Status: AC
  Filled 2012-08-25: qty 50

## 2012-08-25 MED ORDER — HYDROMORPHONE HCL PF 1 MG/ML IJ SOLN: 0.2500 mg | INTRAMUSCULAR | Status: DC | PRN

## 2012-08-25 MED ORDER — ACETAMINOPHEN 10 MG/ML IV SOLN
INTRAVENOUS | Status: AC
  Filled 2012-08-25: qty 100

## 2012-08-25 MED ORDER — OXYCODONE-ACETAMINOPHEN 5-325 MG PO TABS: 1.0000 | ORAL_TABLET | ORAL | Status: DC | PRN

## 2012-08-25 MED ORDER — METHOCARBAMOL 500 MG PO TABS
500.0000 mg | ORAL_TABLET | Freq: Four times a day (QID) | ORAL | Status: DC | PRN
  Administered 2012-08-26: 500 mg via ORAL
  Filled 2012-08-25: qty 1

## 2012-08-25 MED ORDER — ONDANSETRON HCL 4 MG/2ML IJ SOLN
4.0000 mg | Freq: Four times a day (QID) | INTRAMUSCULAR | Status: DC | PRN
  Filled 2012-08-25: qty 2

## 2012-08-25 MED ORDER — METOCLOPRAMIDE HCL 5 MG/ML IJ SOLN
5.0000 mg | Freq: Four times a day (QID) | INTRAMUSCULAR | Status: DC | PRN
  Administered 2012-08-25: 10 mg via INTRAVENOUS

## 2012-08-25 MED ORDER — AMLODIPINE BESYLATE 5 MG PO TABS
5.0000 mg | ORAL_TABLET | Freq: Every day | ORAL | Status: DC
  Administered 2012-08-25: 5 mg via ORAL
  Filled 2012-08-25 (×2): qty 1

## 2012-08-25 MED ORDER — PROMETHAZINE HCL 25 MG/ML IJ SOLN: 6.2500 mg | INTRAMUSCULAR | Status: DC | PRN

## 2012-08-25 SURGICAL SUPPLY — 46 items
BAG ZIPLOCK 12X15 (MISCELLANEOUS) ×2 IMPLANT
BLADE OSCILLATING/SAGITTAL (BLADE) ×1
BLADE SW THK.38XMED LNG THN (BLADE) ×1 IMPLANT
BNDG COHESIVE 6X5 TAN NS LF (GAUZE/BANDAGES/DRESSINGS) IMPLANT
BUR OVAL CARBIDE 4.0 (BURR) ×2 IMPLANT
CLEANER TIP ELECTROSURG 2X2 (MISCELLANEOUS) ×2 IMPLANT
CLOTH BEACON ORANGE TIMEOUT ST (SAFETY) ×2 IMPLANT
CLSR STERI-STRIP ANTIMIC 1/2X4 (GAUZE/BANDAGES/DRESSINGS) ×2 IMPLANT
DRAPE POUCH INSTRU U-SHP 10X18 (DRAPES) ×2 IMPLANT
DRSG EMULSION OIL 3X3 NADH (GAUZE/BANDAGES/DRESSINGS) ×2 IMPLANT
DRSG PAD ABDOMINAL 8X10 ST (GAUZE/BANDAGES/DRESSINGS) ×2 IMPLANT
DURAPREP 26ML APPLICATOR (WOUND CARE) ×2 IMPLANT
ELECT REM PT RETURN 9FT ADLT (ELECTROSURGICAL) ×2
ELECTRODE REM PT RTRN 9FT ADLT (ELECTROSURGICAL) ×1 IMPLANT
GLOVE BIOGEL PI IND STRL 8 (GLOVE) ×1 IMPLANT
GLOVE BIOGEL PI INDICATOR 8 (GLOVE) ×1
GLOVE ECLIPSE 8.0 STRL XLNG CF (GLOVE) ×4 IMPLANT
GLOVE SURG SS PI 6.5 STRL IVOR (GLOVE) ×4 IMPLANT
GOWN PREVENTION PLUS LG XLONG (DISPOSABLE) ×4 IMPLANT
KIT BASIN OR (CUSTOM PROCEDURE TRAY) ×2 IMPLANT
MANIFOLD NEPTUNE II (INSTRUMENTS) ×2 IMPLANT
NEEDLE MA TROC 1/2 (NEEDLE) IMPLANT
NS IRRIG 1000ML POUR BTL (IV SOLUTION) IMPLANT
PACK SHOULDER CUSTOM OPM052 (CUSTOM PROCEDURE TRAY) ×2 IMPLANT
PASSER SUT SWANSON 36MM LOOP (INSTRUMENTS) IMPLANT
POSITIONER SURGICAL ARM (MISCELLANEOUS) ×2 IMPLANT
SLING ARM IMMOBILIZER LRG (SOFTGOODS) ×2 IMPLANT
SLING ARM IMMOBILIZER MED (SOFTGOODS) ×4 IMPLANT
SPONGE GAUZE 4X4 12PLY (GAUZE/BANDAGES/DRESSINGS) ×2 IMPLANT
SPONGE LAP 4X18 X RAY DECT (DISPOSABLE) ×2 IMPLANT
SPONGE SURGIFOAM ABS GEL 100 (HEMOSTASIS) IMPLANT
STAPLER VISISTAT 35W (STAPLE) ×2 IMPLANT
STRIP CLOSURE SKIN 1/2X4 (GAUZE/BANDAGES/DRESSINGS) ×2 IMPLANT
SUCTION FRAZIER 12FR DISP (SUCTIONS) ×2 IMPLANT
SUT BONE WAX W31G (SUTURE) ×2 IMPLANT
SUT ETHIBOND NAB CT1 #1 30IN (SUTURE) IMPLANT
SUT MNCRL AB 4-0 PS2 18 (SUTURE) ×2 IMPLANT
SUT VIC AB 0 CT1 27 (SUTURE) ×1
SUT VIC AB 0 CT1 27XBRD ANTBC (SUTURE) ×1 IMPLANT
SUT VIC AB 1 CT1 27 (SUTURE) ×3
SUT VIC AB 1 CT1 27XBRD ANTBC (SUTURE) ×3 IMPLANT
SUT VIC AB 2-0 CT1 27 (SUTURE)
SUT VIC AB 2-0 CT1 27XBRD (SUTURE) IMPLANT
TAPE CLOTH SURG 6X10 WHT LF (GAUZE/BANDAGES/DRESSINGS) ×2 IMPLANT
TOWEL OR 17X26 10 PK STRL BLUE (TOWEL DISPOSABLE) ×4 IMPLANT
TUBING CONNECTING 10 (TUBING) ×2 IMPLANT

## 2012-08-25 NOTE — Transfer of Care (Signed)
Immediate Anesthesia Transfer of Care Note  Patient: [name]  Procedure(s) Performed: Procedure(s): ROTATOR CUFF REPAIR SHOULDER OPEN WITH ACROMIOPLASTY (Right)  Patient Location: PACU  Anesthesia Type:General  Level of Consciousness: awake, alert , oriented and patient cooperative  Airway & Oxygen Therapy: Patient Spontanous Breathing and Patient connected to face mask oxygen  Post-op Assessment: Report given to PACU RN and Post -op Vital signs reviewed and stable  Post vital signs: stable  Complications: No apparent anesthesia complications

## 2012-08-25 NOTE — Brief Op Note (Signed)
08/25/2012  3:14 PM  PATIENT:  Alcide Evener Braaksma  62 y.o. female  PRE-OPERATIVE DIAGNOSIS:  RIGHT SHOULDER ROTATOR CUFF TEAR and severe impingement.  POST-OPERATIVE DIAGNOSIS:  RIGHT SHOULDER ROTATOR CUFF TEAR and severe impingement.  PROCEDURE:  Procedure(s): ROTATOR CUFF REPAIR SHOULDER OPEN WITH ACROMIOPLASTY (Right)  SURGEON:  Surgeon(s) and Role:    * Tobi Bastos, MD - Primary  PHYSICIAN ASSISTANT: Ardeen Jourdain PA  ASSISTANTS:Amber Martinsburg PA   ANESTHESIA:   general  EBL:  Total I/O In: 1000 [I.V.:1000] Out: -   BLOOD ADMINISTERED:none  DRAINS: none   LOCAL MEDICATIONS USED:  BUPIVICAINE 20cc.  SPECIMEN:  No Specimen  DISPOSITION OF SPECIMEN:  N/A  COUNTS:  YES  TOURNIQUET:  * No tourniquets in log *  DICTATION: .Other Dictation: Dictation Number [id number]  PLAN OF CARE: Admit for overnight observation  PATIENT DISPOSITION:  PACU - hemodynamically stable.   Delay start of Pharmacological VTE agent (>24hrs) due to surgical blood loss or risk of bleeding: yes

## 2012-08-25 NOTE — Anesthesia Preprocedure Evaluation (Signed)
Anesthesia Evaluation  Patient identified by MRN, date of birth, ID band Patient awake    Reviewed: Allergy & Precautions, H&P , NPO status , Patient's Chart, lab work & pertinent test results  Airway Mallampati: III TM Distance: <3 FB Neck ROM: Full    Dental no notable dental hx.    Pulmonary sleep apnea ,  breath sounds clear to auscultation  Pulmonary exam normal       Cardiovascular hypertension, Pt. on medications negative cardio ROS  Rhythm:Regular Rate:Normal     Neuro/Psych negative neurological ROS  negative psych ROS   GI/Hepatic Neg liver ROS, GERD-  Medicated,  Endo/Other  diabetes, Oral Hypoglycemic AgentsMorbid obesity  Renal/GU negative Renal ROS  negative genitourinary   Musculoskeletal negative musculoskeletal ROS (+)   Abdominal   Peds negative pediatric ROS (+)  Hematology negative hematology ROS (+)   Anesthesia Other Findings   Reproductive/Obstetrics negative OB ROS                           Anesthesia Physical Anesthesia Plan  ASA: III  Anesthesia Plan: General   Post-op Pain Management:    Induction: Intravenous  Airway Management Planned: Oral ETT  Additional Equipment:   Intra-op Plan:   Post-operative Plan: Extubation in OR  Informed Consent: I have reviewed the patients History and Physical, chart, labs and discussed the procedure including the risks, benefits and alternatives for the proposed anesthesia with the patient or authorized representative who has indicated his/her understanding and acceptance.   Dental advisory given  Plan Discussed with: CRNA and Surgeon  Anesthesia Plan Comments:         Anesthesia Quick Evaluation

## 2012-08-25 NOTE — Interval H&P Note (Signed)
History and Physical Interval Note:  08/25/2012 12:47 PM  [name]  has presented today for surgery, with the diagnosis of RIGHT SHOULDER ROTATOR CUFF TEAR  The various methods of treatment have been discussed with the patient and family. After consideration of risks, benefits and other options for treatment, the patient has consented to  Procedure(s): ROTATOR CUFF REPAIR SHOULDER OPEN WITH POSSIBLE GRAFT (Right) as a surgical intervention .  The patient's history has been reviewed, patient examined, no change in status, stable for surgery.  I have reviewed the patient's chart and labs.  Questions were answered to the patient's satisfaction.     [name] A

## 2012-08-25 NOTE — Anesthesia Postprocedure Evaluation (Signed)
  Anesthesia Post-op Note  Patient: [name]  Procedure(s) Performed: Procedure(s) (LRB): ROTATOR CUFF REPAIR SHOULDER OPEN WITH ACROMIOPLASTY (Right)  Patient Location: PACU  Anesthesia Type: General  Level of Consciousness: awake and alert   Airway and Oxygen Therapy: Patient Spontanous Breathing  Post-op Pain: mild  Post-op Assessment: Post-op Vital signs reviewed, Patient's Cardiovascular Status Stable, Respiratory Function Stable, Patent Airway and No signs of Nausea or vomiting  Last Vitals:  Filed Vitals:   08/25/12 1532  BP: 105/69  Pulse: 88  Temp: 36.8 C  Resp: 12    Post-op Vital Signs: stable   Complications: No apparent anesthesia complications

## 2012-08-25 NOTE — Interval H&P Note (Signed)
History and Physical Interval Note:  08/25/2012 12:47 PM  [name]  has presented today for surgery, with the diagnosis of RIGHT SHOULDER ROTATOR CUFF TEAR  The various methods of treatment have been discussed with the patient and family. After consideration of risks, benefits and other options for treatment, the patient has consented to  Procedure(s): ROTATOR CUFF REPAIR SHOULDER OPEN WITH POSSIBLE GRAFT (Right) as a surgical intervention .  The patient's history has been reviewed, patient examined, no change in status, stable for surgery.  I have reviewed the patient's chart and labs.  Questions were answered to the patient's satisfaction.     [name] A

## 2012-08-26 ENCOUNTER — Institutional Professional Consult (permissible substitution): Payer: Medicaid Other | Admitting: Pulmonary Disease

## 2012-08-26 ENCOUNTER — Encounter (HOSPITAL_COMMUNITY): Payer: Self-pay | Admitting: Orthopedic Surgery

## 2012-08-26 LAB — GLUCOSE, CAPILLARY
Glucose-Capillary: 170 mg/dL — ABNORMAL HIGH (ref 70–99)
Glucose-Capillary: 191 mg/dL — ABNORMAL HIGH (ref 70–99)

## 2012-08-26 MED ORDER — METHOCARBAMOL 500 MG PO TABS: 500.0000 mg | ORAL_TABLET | Freq: Four times a day (QID) | ORAL | Status: DC | PRN

## 2012-08-26 MED ORDER — HYDROCODONE-ACETAMINOPHEN 5-325 MG PO TABS: 1.0000 | ORAL_TABLET | ORAL | Status: DC | PRN

## 2012-08-26 NOTE — Progress Notes (Signed)
Subjective: 1 Day Post-Op Procedure(s) (LRB): ROTATOR CUFF REPAIR SHOULDER OPEN WITH ACROMIOPLASTY (Right) Patient reports pain as 2 on 0-10 scale. Doing well today.   Objective: Vital signs in last 24 hours: Temp:  [97.4 F (36.3 C)-98.3 F (36.8 C)] 98.1 F (36.7 C) (03/13 0543) Pulse Rate:  [59-91] 81 (03/13 0543) Resp:  [12-20] 20 (03/13 0543) BP: (105-148)/(56-86) 120/80 mmHg (03/13 0543) SpO2:  [92 %-100 %] 92 % (03/13 0543) Weight:  [95.255 kg (210 lb)] 95.255 kg (210 lb) (03/12 1641)  Intake/Output from previous day: 03/12 0701 - 03/13 0700 In: 3566.7 [P.O.:780; I.V.:2786.7] Out: 750 [Urine:750] Intake/Output this shift:    No results found for this basename: HGB,  in the last 72 hours No results found for this basename: WBC, RBC, HCT, PLT,  in the last 72 hours No results found for this basename: NA, K, CL, CO2, BUN, CREATININE, GLUCOSE, CALCIUM,  in the last 72 hours No results found for this basename: LABPT, INR,  in the last 72 hours  Neurovascular intact  Assessment/Plan: 1 Day Post-Op Procedure(s) (LRB): ROTATOR CUFF REPAIR SHOULDER OPEN WITH ACROMIOPLASTY (Right) Discharge home with home health. Will see in office in one week.  [name] A 08/26/2012, 7:19 AM

## 2012-08-26 NOTE — Care Management Note (Signed)
    Page 1 of 1   08/26/2012     11:31:36 AM   CARE MANAGEMENT NOTE 08/26/2012  Patient:  [name], [name]   Account Number:  [id number]  Date Initiated:  08/26/2012  Documentation initiated by:  Sherrin Daisy  Subjective/Objective Assessment:   DX Rotator cuff repair--rt     Action/Plan:   CM spoke with patient. Plans are for patient to return to her home in [place] where daughter will be caregiver. Sling appliance in place on right. No DME orf HH needs   Anticipated DC Date:  08/26/2012   Anticipated DC Plan:  [place]  CM consult      Choice offered to / List presented to:             Status of service:  Completed, signed off Medicare Important Message given?   (If response is "NO", the following Medicare IM given date fields will be blank) Date Medicare IM given:   Date Additional Medicare IM given:    Discharge Disposition:  HOME/SELF CARE  Per UR Regulation:  Reviewed for med. necessity/level of care/duration of stay  If discussed at [place] of Stay Meetings, dates discussed:    Comments:

## 2012-08-26 NOTE — Progress Notes (Signed)
Subjective: 1 Day Post-Op Procedure(s) (LRB): ROTATOR CUFF REPAIR SHOULDER OPEN WITH ACROMIOPLASTY (Right) Patient reports pain as 2 on 0-10 scale.    Objective: Vital signs in last 24 hours: Temp:  [97.4 F (36.3 C)-98.3 F (36.8 C)] 98.1 F (36.7 C) (03/13 0543) Pulse Rate:  [59-91] 81 (03/13 0543) Resp:  [12-20] 20 (03/13 0543) BP: (105-148)/(56-86) 120/80 mmHg (03/13 0543) SpO2:  [92 %-100 %] 92 % (03/13 0543) Weight:  [95.255 kg (210 lb)] 95.255 kg (210 lb) (03/12 1641)  Intake/Output from previous day: 03/12 0701 - 03/13 0700 In: 3566.7 [P.O.:780; I.V.:2786.7] Out: 750 [Urine:750] Intake/Output this shift:    No results found for this basename: HGB,  in the last 72 hours No results found for this basename: WBC, RBC, HCT, PLT,  in the last 72 hours No results found for this basename: NA, K, CL, CO2, BUN, CREATININE, GLUCOSE, CALCIUM,  in the last 72 hours No results found for this basename: LABPT, INR,  in the last 72 hours  Neurologically intact  Assessment/Plan: 1 Day Post-Op Procedure(s) (LRB): ROTATOR CUFF REPAIR SHOULDER OPEN WITH ACROMIOPLASTY (Right) Discharge home with home health.Will see in one week.  [name] A 08/26/2012, 7:20 AM

## 2012-08-26 NOTE — Progress Notes (Signed)
Stopped by to see patient. Offered support and offered prayer for her continuing recovery. She indicates that she might go home later today and seemed happy about that.

## 2012-08-26 NOTE — Op Note (Signed)
NAMEJAMISA, [name] NO.:  [id number]  MEDICAL RECORD NO.:  [id number]  LOCATION:  [id number]                         FACILITY:  [facility]  PHYSICIAN:  Kipp Brood. Mohamadou Maciver, M.D.DATE OF BIRTH:  02-25-1951  DATE OF PROCEDURE:  08/25/2012 DATE OF DISCHARGE:                              OPERATIVE REPORT   SURGEON:  Kipp Brood. Gladstone Lighter, M.D.  ASSISTANT:  Ardeen Jourdain, [place]  PREOPERATIVE DIAGNOSES: 1. Severe impingement syndrome of the right shoulder. 2. Rotator cuff tendon tear, right shoulder.  POSTOPERATIVE DIAGNOSES: 1. Severe impingement syndrome of the right shoulder. 2. Rotator cuff tendon tear, right shoulder.  OPERATION: 1. Open acromionectomy, right shoulder. 2. Repair of 2 small tear of the rotator cuff, right shoulder.  PROCEDURE:  Under general anesthesia, routine orthopedic prep and draping of the right shoulder was carried out.  Appropriate time-out was carried out.  I also marked the appropriate right arm in the holding area.  The patient had 2 g of IV Ancef.  An incision was made over the anterior aspect of the right shoulder, 2 bleeders were identified and cauterized.  Following that, I stripped the deltoid tendon and muscle from the acromion in the usual fashion.  She had severe impingement syndrome where the acromion was literally impinging down into the cuff. There was no major holes in the cuff,  there is a 2 small areas of tear, which could have been created by the dissection, we were not sure because of the severe impingement and a 2 small areas were repaired with #1 Tycron.  I then went and protected the remaining part of the rotator cuff.  We had to remove the portion of the bursa as well.  It was very difficult to separate the cuff from the bone because of the severe impingement, we finally were able to get an instrument in there and then utilized the oscillating saw and a burr to do an open acromionectomy and acromioplasty to re-establish the  subacromial space.  We thoroughly irrigated out the area of the bone waxed the undersurface of the acromion.  At this time, as I had mentioned that sutures were placed in these 2 defect in the cuff.  We thoroughly irrigated out the area.  I thoroughly explored the remaining part of the cuff, the cuff was somewhat thin, no graft was necessary nor any anchors.  I then inserted some thrombin-soaked Gelfoam, reapproximated tendon the muscle in the usual fashion.  Subcu was closed with 2-0 Vicryl and subcutaneous suture was used.  Sterile dressings were applied.  The patient was placed in a shoulder immobilizer.          ______________________________ Kipp Brood Gladstone Lighter, M.D.     RAG/MEDQ  D:  08/25/2012  T:  08/26/2012  Job:  [id number]

## 2012-08-26 NOTE — Evaluation (Signed)
Occupational Therapy Evaluation Patient Details Name: [name] MRN: [id number] DOB: 1950/07/09 Today's Date: 08/26/2012 Time: 1115-1140 OT Time Calculation (min): 25 min  OT Assessment / Plan / Recommendation Clinical Impression  This 62 year old female was admitted for R RCR.  All education was completed. Pt will follow up with Dr Gladstone Lighter for further rehab.      OT Assessment  Progress rehab of shoulder as ordered by MD at follow-up appointment    Follow Up Recommendations   (will follow up wtih Dr Gladstone Lighter)    Barriers to Discharge      Equipment Recommendations  None recommended by OT    Recommendations for Other Services    Frequency       Precautions / Restrictions Precautions Precautions: Shoulder Type of Shoulder Precautions: sling aat x bathing/dressing Restrictions Weight Bearing Restrictions: No   Pertinent Vitals/Pain Pain Ok, right shoulder    ADL  Upper Body Bathing: Moderate assistance Where Assessed - Upper Body Bathing: Unsupported sitting Upper Body Dressing: Maximal assistance Where Assessed - Upper Body Dressing: Unsupported sitting Transfers/Ambulation Related to ADLs: pt was standing up at sink when I arrived.  Moving well ADL Comments: All education completed and shoulder protocol given to pt.  Pt and daughter verbalize understanding.  Worked through [employer] and daughter applied sling    OT Diagnosis:    OT Problem List:   OT Treatment Interventions:     OT Goals    Visit Information  Last OT Received On: 08/26/12 Assistance Needed: +1    Subjective Data  Subjective: it doesn't hurt too bad Patient Stated Goal: get back to doing everything.  Pt is a Manufacturing systems engineer     Home Living Lives With: Alone Available Help at Discharge: Family;Available PRN/intermittently (daughter lives very close wil be in and out) Bathroom Shower/Tub: Chiropodist: Standard Prior Function Level of Independence:  Independent Communication Communication: No difficulties         Vision/Perception     Solicitor Overall Cognitive Status: Appears within functional limits for tasks assessed/performed Arousal/Alertness: Awake/alert Orientation Level: Oriented X4 / Intact Behavior During Session: WFL for tasks performed    Extremity/Trunk Assessment Right Upper Extremity Assessment RUE ROM/Strength/Tone: Deficits;Due to precautions (pt is r dominant) Left Upper Extremity Assessment LUE ROM/Strength/Tone: WFL for tasks assessed     Mobility Transfers Transfers: Sit to Stand Sit to Stand: 7: Independent     Exercise Donning/doffing shirt without moving shoulder: Caregiver independent with task (verbalize all; return demonstrated sling) Method for sponge bathing under operated UE: Caregiver independent with task Donning/doffing sling/immobilizer: Caregiver independent with task Correct positioning of sling/immobilizer: Caregiver independent with task ROM for elbow, wrist and digits of operated UE: Independent (wrist and fingers) Sling wearing schedule (on at all times/off for ADL's): Independent Proper positioning of operated UE when showering: Independent Dressing change: Caregiver independent with task Positioning of UE while sleeping: Caregiver independent with task   Balance     End of Session OT - End of Session Activity Tolerance: Patient tolerated treatment well Patient left: in chair;with call bell/phone within reach;with family/visitor present  GO Functional Assessment Tool Used: clinical observation Functional Limitation: Self care Self Care Current Status [id number]): At least 60 percent but less than 80 percent impaired, limited or restricted Self Care Goal Status [id number]): At least 60 percent but less than 80 percent impaired, limited or restricted Self Care Discharge Status [phone]): At least 60 percent but less than 80  percent impaired, limited or restricted    Hugh Kamara 08/26/2012, 11:56 AM Lesle Chris, OTR/L [phone] 08/26/2012

## 2012-09-06 NOTE — Discharge Summary (Signed)
Physician Discharge Summary   Patient ID: [name] MRN: [id number] DOB/AGE: [birth year] 62 y.o.  Admit date: 08/25/2012 Discharge date: 08/26/2012  Primary Diagnosis: Right shoulder rotator cuff tear   Admission Diagnoses:  Past Medical History  Diagnosis Date  . Hypertension   . Diabetes mellitus without complication   . Sleep apnea     recently diagnosed  . Arthritis   . Headache   . H/O hiatal hernia   . GERD (gastroesophageal reflux disease)    Discharge Diagnoses:   Active Problems:   Rotator cuff tear, non-traumatic  Estimated body mass index is 36.61 kg/(m^2) as calculated from the following:   Height as of this encounter: 5' 3.5" (1.613 m).   Weight as of this encounter: 95.255 kg (210 lb).  Procedure:  Procedure(s) (LRB): ROTATOR CUFF REPAIR SHOULDER OPEN WITH ACROMIOPLASTY (Right)   Consults: None  HPI: [name] presented with the chief complaint of right shoulder pain. She reports she has not had any known injury. She has had the pain for the past five months. She does have a history of a bilateral rotator cuff repair. She said the right shoulder had surgery about 12 years ago. She has had no issues since then until five months ago. Once again, no known injury. 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 She occasionally has some symptoms on the left side but this is more recent. She feels like it is from compensating with the right shoulder. No numbness or tingling in the arms. No neck pain. She has not been taking anti-inflammatories. She has been icing her shoulder occasionally. This has not helped. She had the shoulder injected with cortisone which did not help. MRI showed a 70% tear of the rotator cuff on the right.   Laboratory Data: Admission on 08/25/2012, Discharged on 08/26/2012  Component Date Value Range Status  .  Glucose-Capillary 08/25/2012 128* 70 - 99 mg/dL Final  . Comment 1 08/25/2012 Documented in Chart   Final  . Glucose-Capillary 08/25/2012 178* 70 - 99 mg/dL Final  . Comment 1 08/25/2012 Documented in Chart   Final  . Comment 2 08/25/2012 Notify RN   Final  . Glucose-Capillary 08/25/2012 189* 70 - 99 mg/dL Final  . Glucose-Capillary 08/25/2012 218* 70 - 99 mg/dL Final  . Comment 1 08/25/2012 Notify RN   Final  . Glucose-Capillary 08/26/2012 170* 70 - 99 mg/dL Final  . Glucose-Capillary 08/26/2012 191* 70 - 99 mg/dL Final  Hospital Outpatient Visit on 08/18/2012  Component Date Value Range Status  . WBC 08/18/2012 7.1  4.0 - 10.5 K/uL Final  . RBC 08/18/2012 4.56  3.87 - 5.11 MIL/uL Final  . Hemoglobin 08/18/2012 12.5  12.0 - 15.0 g/dL Final  . HCT 08/18/2012 38.2  36.0 - 46.0 % Final  . MCV 08/18/2012 83.8  78.0 - 100.0 fL Final  . MCH 08/18/2012 27.4  26.0 - 34.0 pg Final  . MCHC 08/18/2012 32.7  30.0 - 36.0 g/dL Final  . RDW 08/18/2012 13.2  11.5 - 15.5 % Final  . Platelets 08/18/2012 201  150 - 400 K/uL Final  . MRSA, PCR 08/18/2012 NEGATIVE  NEGATIVE Final  . Staphylococcus aureus 08/18/2012 NEGATIVE  NEGATIVE Final   Comment:  The Xpert SA Assay (FDA                          approved for NASAL specimens                          in patients over 7 years of age),                          is one component of                          a comprehensive surveillance                          program.  Test performance has                          been validated by [employer] for patients greater                          than or equal to 37 year old.                          It is not intended                          to diagnose infection nor to                          guide or monitor treatment.  Marland Kitchen aPTT 08/18/2012 30  24 - 37 seconds Final  . Sodium 08/18/2012 134* 135 - 145 mEq/L Final  . Potassium 08/18/2012 4.0  3.5 -  5.1 mEq/L Final  . Chloride 08/18/2012 97  96 - 112 mEq/L Final  . CO2 08/18/2012 27  19 - 32 mEq/L Final  . Glucose, Bld 08/18/2012 155* 70 - 99 mg/dL Final  . BUN 08/18/2012 17  6 - 23 mg/dL Final  . Creatinine, Ser 08/18/2012 0.96  0.50 - 1.10 mg/dL Final  . Calcium 08/18/2012 9.6  8.4 - 10.5 mg/dL Final  . Total Protein 08/18/2012 7.1  6.0 - 8.3 g/dL Final  . Albumin 08/18/2012 3.5  3.5 - 5.2 g/dL Final  . AST 08/18/2012 17  0 - 37 U/L Final  . ALT 08/18/2012 31  0 - 35 U/L Final  . Alkaline Phosphatase 08/18/2012 94  39 - 117 U/L Final  . Total Bilirubin 08/18/2012 0.5  0.3 - 1.2 mg/dL Final  . GFR calc non Af Amer 08/18/2012 63* >90 mL/min Final  . GFR calc Af Amer 08/18/2012 74* >90 mL/min Final   Comment:                                 The eGFR has been calculated                          using the CKD EPI equation.  This calculation has not been                          validated in all clinical                          situations.                          eGFR's persistently                          <90 mL/min signify                          possible Chronic Kidney Disease.  Marland Kitchen Prothrombin Time 08/18/2012 12.5  11.6 - 15.2 seconds Final  . INR 08/18/2012 0.94  0.00 - 1.49 Final  . Color, Urine 08/18/2012 YELLOW  YELLOW Final  . APPearance 08/18/2012 CLEAR  CLEAR Final  . Specific Gravity, Urine 08/18/2012 1.015  1.005 - 1.030 Final  . pH 08/18/2012 6.5  5.0 - 8.0 Final  . Glucose, UA 08/18/2012 NEGATIVE  NEGATIVE mg/dL Final  . Hgb urine dipstick 08/18/2012 NEGATIVE  NEGATIVE Final  . Bilirubin Urine 08/18/2012 NEGATIVE  NEGATIVE Final  . Ketones, ur 08/18/2012 NEGATIVE  NEGATIVE mg/dL Final  . Protein, ur 08/18/2012 NEGATIVE  NEGATIVE mg/dL Final  . Urobilinogen, UA 08/18/2012 0.2  0.0 - 1.0 mg/dL Final  . Nitrite 08/18/2012 NEGATIVE  NEGATIVE Final  . Leukocytes, UA 08/18/2012 NEGATIVE  NEGATIVE Final   MICROSCOPIC NOT DONE ON URINES WITH  NEGATIVE PROTEIN, BLOOD, LEUKOCYTES, NITRITE, OR GLUCOSE <1000 mg/dL.     X-Rays:Dg Chest 2 View  08/18/2012  *RADIOLOGY REPORT*  Clinical Data: 62 year old female preoperative study for shoulder repair.  Diabetes and hypertension.  CHEST - 2 VIEW  Comparison: 07/14/2008.  Findings: Mildly lower lung volumes with mild elevation of the right hemidiaphragm. Cardiac and mediastinal contours are stable and within normal limits. Visualized tracheal air column is within normal limits.  No pneumothorax, pulmonary edema, pleural effusion or confluent pulmonary opacity.  Right upper quadrant surgical clips again noted. No acute osseous abnormality identified.  IMPRESSION: Lower lung volumes, otherwise no acute cardiopulmonary abnormality.   Original Report Authenticated By: Roselyn Reef, M.D.      Hospital Course: SANYLA LAMBERTY is a 62 y.o. who was admitted to [facility]. They were brought to the operating room on 08/25/2012 and underwent Procedure(s): [place] ACROMIOPLASTY.  Patient tolerated the procedure well and was later transferred to the recovery room and then to the orthopaedic floor for postoperative care.  They were given PO and IV analgesics for pain control following their surgery.  They were given 24 hours of postoperative antibiotics of  Anti-infectives   Start     Dose/Rate Route Frequency Ordered Stop   08/25/12 2000  ceFAZolin (ANCEF) IVPB 1 g/50 mL premix     1 g 100 mL/hr over 30 Minutes Intravenous Every 6 hours 08/25/12 1656 08/26/12 1004   08/25/12 1423  polymyxin B 500,000 Units, bacitracin 50,000 Units in sodium chloride irrigation 0.9 % 500 mL irrigation  Status:  Discontinued       As needed 08/25/12 1423 08/25/12 1529   08/25/12 1019  ceFAZolin (ANCEF) IVPB 2 g/50 mL premix     2 g 100 mL/hr  over 30 Minutes Intravenous On call to O.R. 08/25/12 1019 08/25/12 1342    OT was ordered for instruction of sling management with ADLs.   Discharge planning consulted to help with postop disposition and equipment needs.  Patient had a good night on the evening of surgery.  They started to get up OOB with therapy on day one.  Patient was seen in rounds and was ready to go home.    Discharge Medications: Prior to Admission medications   Medication Sig Start Date End Date Taking? Authorizing Provider  amLODipine (NORVASC) 5 MG tablet Take 5 mg by mouth daily.   Yes Historical Provider, MD  cholecalciferol (VITAMIN D) 1000 UNITS tablet Take 1,000 Units by mouth daily.   Yes Historical Provider, MD  glimepiride (AMARYL) 2 MG tablet Take 2 mg by mouth daily before breakfast.   Yes Historical Provider, MD  ibuprofen (ADVIL,MOTRIN) 200 MG tablet Take 200 mg by mouth every 6 (six) hours as needed for pain.   Yes Historical Provider, MD  Liraglutide (VICTOZA) 18 MG/3ML SOLN injection Inject 1.2 mg into the skin daily after breakfast.    Yes Historical Provider, MD  omeprazole (PRILOSEC) 20 MG capsule Take 20 mg by mouth daily.   Yes Historical Provider, MD  HYDROcodone-acetaminophen (NORCO/VICODIN) 5-325 MG per tablet Take 1-2 tablets by mouth every 4 (four) hours as needed. 08/26/12   Vernor Monnig Renelda Loma, PA-C  methocarbamol (ROBAXIN) 500 MG tablet Take 1 tablet (500 mg total) by mouth every 6 (six) hours as needed. 08/26/12   Aryam Zhan Renelda Loma, PA-C    Diet: Cardiac diet Activity:Wear sling at all times Follow-up:in 2 weeks Disposition - Home Discharged Condition: good   Discharge Orders   Future Appointments Provider Department Dept Phone   09/08/2012 2:00 PM Chesley Mires, MD [facility] [phone]   Future Orders Complete By Expires     Call MD / Call 911  As directed     Comments:      If you experience chest pain or shortness of breath, CALL 911 and be transported to the hospital emergency room.  If you develope a fever above 101 F, pus (white drainage) or increased drainage or redness at the wound, or calf  pain, call your surgeon's office.    Constipation Prevention  As directed     Comments:      Drink plenty of fluids.  Prune juice may be helpful.  You may use a stool softener, such as Colace (over the counter) 100 mg twice a day.  Use MiraLax (over the counter) for constipation as needed.    Diet Carb Modified  As directed     Discharge instructions  As directed     Comments:      Keep your sling on at all times, including sleeping in your sling.  The only time you should remove your sling is to shower only but you need to keep your hand against your chest while you shower.   For the first few days, remove your dressing, tape a piece of saran wrap over your incision, take your shower, then remove the saran wrap and put a clean dressing on, then reapply your sling.  After two days you can shower without the saran wrap.   Call Dr. Gladstone Lighter if any wound complications or temperature of 101 degrees F or over.   Call the office for an appointment to see Dr. Gladstone Lighter in ten days: [phone] and ask for Dr. Charlestine Night nurse, Brunilda Payor.  Increase activity slowly as tolerated  As directed     Lifting restrictions  As directed     Comments:      No lifting        Medication List    TAKE these medications       amLODipine 5 MG tablet  Commonly known as:  NORVASC  Take 5 mg by mouth daily.     cholecalciferol 1000 UNITS tablet  Commonly known as:  VITAMIN D  Take 1,000 Units by mouth daily.     glimepiride 2 MG tablet  Commonly known as:  AMARYL  Take 2 mg by mouth daily before breakfast.     HYDROcodone-acetaminophen 5-325 MG per tablet  Commonly known as:  NORCO/VICODIN  Take 1-2 tablets by mouth every 4 (four) hours as needed.     ibuprofen 200 MG tablet  Commonly known as:  ADVIL,MOTRIN  Take 200 mg by mouth every 6 (six) hours as needed for pain.     methocarbamol 500 MG tablet  Commonly known as:  ROBAXIN  Take 1 tablet (500 mg total) by mouth every 6 (six) hours as  needed.     omeprazole 20 MG capsule  Commonly known as:  PRILOSEC  Take 20 mg by mouth daily.     VICTOZA 18 MG/3ML Soln injection  Generic drug:  Liraglutide  Inject 1.2 mg into the skin daily after breakfast.           Follow-up Information   Follow up with GIOFFRE,RONALD A, MD. Schedule an appointment as soon as possible for a visit in 10 days.   Contact information:   [address], [address] [place], [place] [address] [phone]       Signed: Malachy Chamber 09/06/2012, 7:02 AM

## 2012-09-08 ENCOUNTER — Institutional Professional Consult (permissible substitution): Payer: Medicaid Other | Admitting: Pulmonary Disease

## 2012-09-10 ENCOUNTER — Other Ambulatory Visit: Payer: Self-pay | Admitting: Nephrology

## 2012-09-10 DIAGNOSIS — N183 Chronic kidney disease, stage 3 unspecified: Secondary | ICD-10-CM

## 2012-09-17 ENCOUNTER — Ambulatory Visit
Admission: RE | Admit: 2012-09-17 | Discharge: 2012-09-17 | Disposition: A | Discharge status: Home / Self Care | Payer: Medicare Other | Source: Ambulatory Visit | Attending: Nephrology | Admitting: Nephrology

## 2012-09-17 DIAGNOSIS — N183 Chronic kidney disease, stage 3 unspecified: Secondary | ICD-10-CM

## 2012-10-11 ENCOUNTER — Encounter: Payer: Self-pay | Admitting: Pulmonary Disease

## 2012-10-11 ENCOUNTER — Ambulatory Visit (INDEPENDENT_AMBULATORY_CARE_PROVIDER_SITE_OTHER): Payer: Medicare Other | Admitting: Pulmonary Disease

## 2012-10-11 VITALS — BP 124/70 | HR 64 | Temp 97.8°F | Ht 64.0 in | Wt 208.0 lb

## 2012-10-11 DIAGNOSIS — G4733 Obstructive sleep apnea (adult) (pediatric): Secondary | ICD-10-CM

## 2012-10-11 NOTE — Assessment & Plan Note (Signed)
She has snoring, sleep disruption, and daytime sleepiness.  She has history of hypertension and diabetes.  Her home sleep study shows moderate obstructive sleep apnea.  I have reviewed the recent sleep study results with the patient.  We discussed how sleep apnea can affect various health problems including risks for hypertension, cardiovascular disease, and diabetes.  We also discussed how sleep disruption can increase risks for accident, such as while driving.  Weight loss as a means of improving sleep apnea was also reviewed.  Additional treatment options discussed were CPAP therapy, oral appliance, and surgical intervention.  Will arrange for auto CPAP set up.  Advised she can continue with OTC sleep aides as needed for now, but will try to wean her off this once she is established on CPAP.

## 2012-10-11 NOTE — Progress Notes (Deleted)
  Subjective:    Patient ID: [name], female    DOB: December 26, 1950, 62 y.o.   MRN: [id number]  HPI    Review of Systems  Constitutional: Negative for appetite change and unexpected weight change.  HENT: Negative for ear pain, congestion, sore throat, sneezing, trouble swallowing and dental problem.   Respiratory: Positive for shortness of breath. Negative for cough.   Cardiovascular: Negative for chest pain, palpitations and leg swelling.  Gastrointestinal: Negative for abdominal pain.  Musculoskeletal: Negative for joint swelling.  Skin: Negative for rash.  Neurological: Positive for headaches.  Psychiatric/Behavioral: Negative for dysphoric mood. The patient is not nervous/anxious.        Objective:   Physical Exam        Assessment & Plan:

## 2012-10-11 NOTE — Progress Notes (Signed)
Chief Complaint  Patient presents with  . Advice Only    Pt c/o waking up 2-3 times during the night, snores at night, feels tired during the day, stops breathing at night.     History of Present Illness: [name] is a 62 y.o. female for evaluation of sleep problems.  She snores, and wakes up with a choking sensation.  She can't sleep on her back.  She gets a dry mouth at night.  She also gets frequent cramps in her legs at night.  She goes to sleep at 11 pm.  She falls asleep after 30 minutes.  She wakes up 2 times to use the bathroom.  She gets out of bed at 7 am.  She feels tired in the morning.  She gets frequent morning headache.  She does not use anything to help her stay awake.  She denies sleep walking, sleep talking, bruxism, or nightmares.  There is no history of restless legs.  She denies sleep hallucinations, sleep paralysis, or cataplexy.  The Epworth score is 2 out of 24.  Tests: HST 05/27/12 >> AHI 17, SpO2 low 75%  [name]  has a past medical history of Hypertension; Diabetes mellitus without complication; Sleep apnea; Arthritis; Headache; H/O hiatal hernia; and GERD (gastroesophageal reflux disease).  [name]  has past surgical history that includes Tonsillectomy; Abdominal hysterectomy; Cholecystectomy; left shoulder; right shoulder; Back surgery; and Shoulder open rotator cuff repair (Right, 08/25/2012).  Prior to Admission medications   Medication Sig Start Date End Date Taking? Authorizing Provider  amLODipine (NORVASC) 5 MG tablet Take 5 mg by mouth daily.   Yes Historical Provider, MD  cholecalciferol (VITAMIN D) 1000 UNITS tablet Take 1,000 Units by mouth daily.   Yes Historical Provider, MD  glimepiride (AMARYL) 2 MG tablet Take 2 mg by mouth daily before breakfast.   Yes Historical Provider, MD  HYDROcodone-acetaminophen (NORCO/VICODIN) 5-325 MG per tablet Take 1-2 tablets by mouth every 4 (four) hours as needed. 08/26/12  Yes  Amber Renelda Loma, PA-C  ibuprofen (ADVIL,MOTRIN) 200 MG tablet Take 200 mg by mouth every 6 (six) hours as needed for pain.   Yes Historical Provider, MD  Liraglutide (VICTOZA) 18 MG/3ML SOLN injection Inject 1.2 mg into the skin daily after breakfast.    Yes Historical Provider, MD  methocarbamol (ROBAXIN) 500 MG tablet Take 1 tablet (500 mg total) by mouth every 6 (six) hours as needed. 08/26/12  Yes Amber Renelda Loma, PA-C  omeprazole (PRILOSEC) 20 MG capsule Take 20 mg by mouth daily.   Yes Historical Provider, MD    No Known Allergies  Her family history includes Bone cancer in her maternal uncle and Colon cancer in her father, maternal grandfather, and paternal grandfather.  She  reports that she has never smoked. She does not have any smokeless tobacco history on file. She reports that  drinks alcohol. She reports that she does not use illicit drugs.  Review of Systems  Constitutional: Negative for appetite change and unexpected weight change.  HENT: Negative for ear pain, congestion, sore throat, sneezing, trouble swallowing and dental problem.   Respiratory: Positive for shortness of breath. Negative for cough.   Cardiovascular: Negative for chest pain, palpitations and leg swelling.  Gastrointestinal: Negative for abdominal pain.  Musculoskeletal: Negative for joint swelling.  Skin: Negative for rash.  Neurological: Positive for headaches.  Psychiatric/Behavioral: Negative for dysphoric mood. The patient is not nervous/anxious.    Physical Exam:  General - No distress  ENT - No sinus tenderness, MP3, wears dentures, no oral exudate, no LAN, no thyromegaly, TM clear, pupils equal/reactive Cardiac - s1s2 regular, no murmur, pulses symmetric Chest - No wheeze/rales/dullness, good air entry, normal respiratory excursion Back - No focal tenderness Abd - Soft, non-tender, no organomegaly, + bowel sounds Ext - No edema Neuro - Normal strength, cranial nerves intact Skin  - No rashes Psych - Normal mood, and behavior

## 2012-10-11 NOTE — Patient Instructions (Signed)
Will arrange for CPAP set up at home Follow up in 8 weeks

## 2012-12-07 ENCOUNTER — Ambulatory Visit (INDEPENDENT_AMBULATORY_CARE_PROVIDER_SITE_OTHER): Payer: Medicare Other | Admitting: Pulmonary Disease

## 2012-12-07 ENCOUNTER — Encounter: Payer: Self-pay | Admitting: Pulmonary Disease

## 2012-12-07 VITALS — BP 122/84 | HR 78 | Temp 98.4°F | Ht 64.0 in | Wt 212.0 lb

## 2012-12-07 DIAGNOSIS — G4733 Obstructive sleep apnea (adult) (pediatric): Secondary | ICD-10-CM

## 2012-12-07 NOTE — Assessment & Plan Note (Signed)
She reports compliance with CPAP and benefit from therapy.  Will get her download and call with results.  Advised her to discuss mask fit issues with her DME.

## 2012-12-07 NOTE — Patient Instructions (Signed)
Will get report of CPAP machine and call with results Follow up in 1 year

## 2012-12-07 NOTE — Progress Notes (Signed)
Chief Complaint  Patient presents with  . Sleep Apnea    Currently using CPAP machine most nights for 6-8 hours. Reports having some air leaking out of the side of her mask. Denies problems with the machine or pressure. Feels well rested the next when she does use it.    History of Present Illness: [name] is a 62 y.o. female with OSA.  She is doing well with CPAP.  She uses her device for about 7 hours per night.  She is not needing OTC sleep aides as much.  She still gets occasional headaches.  She feels more rested during the day.  She use full face mask >> her mask sometimes leaks from the sides.  TESTS: HST 05/27/12 >> AHI 17, SpO2 low 75%  [name]  has a past medical history of Hypertension; Diabetes mellitus without complication; Sleep apnea; Arthritis; Headache(784.0); H/O hiatal hernia; and GERD (gastroesophageal reflux disease).  [name]  has past surgical history that includes Tonsillectomy; Abdominal hysterectomy; Cholecystectomy; left shoulder; right shoulder; Back surgery; and Shoulder open rotator cuff repair (Right, 08/25/2012).  Prior to Admission medications   Medication Sig Start Date End Date Taking? Authorizing Provider  amLODipine (NORVASC) 5 MG tablet Take 5 mg by mouth daily.   Yes Historical Provider, MD  cholecalciferol (VITAMIN D) 1000 UNITS tablet Take 1,000 Units by mouth daily.   Yes Historical Provider, MD  glimepiride (AMARYL) 2 MG tablet Take 2 mg by mouth daily before breakfast.   Yes Historical Provider, MD  HYDROcodone-acetaminophen (NORCO/VICODIN) 5-325 MG per tablet Take 1-2 tablets by mouth every 4 (four) hours as needed. 08/26/12  Yes Amber Renelda Loma, PA-C  ibuprofen (ADVIL,MOTRIN) 200 MG tablet Take 200 mg by mouth every 6 (six) hours as needed for pain.   Yes Historical Provider, MD  Liraglutide (VICTOZA) 18 MG/3ML SOLN injection Inject 1.2 mg into the skin daily after breakfast.    Yes Historical Provider, MD   methocarbamol (ROBAXIN) 500 MG tablet Take 1 tablet (500 mg total) by mouth every 6 (six) hours as needed. 08/26/12  Yes Amber Renelda Loma, PA-C  omeprazole (PRILOSEC) 20 MG capsule Take 20 mg by mouth daily.   Yes Historical Provider, MD    No Known Allergies   Physical Exam:  General - No distress ENT - No sinus tenderness, MP 3, wears dentures, no oral exudate, no LAN Cardiac - s1s2 regular, no murmur Chest - No wheeze/rales/dullness Back - No focal tenderness Abd - Soft, non-tender Ext - No edema Neuro - Normal strength Skin - No rashes Psych - normal mood, and behavior   Assessment/Plan:  Chesley Mires, MD [facility] Pager:  [phone]

## 2012-12-21 ENCOUNTER — Telehealth: Payer: Self-pay | Admitting: Pulmonary Disease

## 2012-12-21 NOTE — Telephone Encounter (Signed)
Pt is aware of download results. 

## 2012-12-21 NOTE — Telephone Encounter (Signed)
Auto CPAP 10/21/12 to 11/19/12 >> Used on 12 of 30 nights with average 4 hrs 22 min.  Average AHI 0.4 with median CPAP 6 cm H2O and 95 th percentile CPAP 9 cm H2O.  Will have my nurse inform pt that CPAP report shows good control of sleep apnea, but she needs to use device for entire time she is asleep to get maximal benefit.

## 2012-12-30 ENCOUNTER — Telehealth: Payer: Self-pay | Admitting: Pulmonary Disease

## 2012-12-30 NOTE — Telephone Encounter (Signed)
Pt is aware of download results. 

## 2012-12-30 NOTE — Telephone Encounter (Signed)
Auto CPAP 11/24/12 to 12/23/12 >> Used on 30 of 30 nights with average 6 hrs 29 min.  Average AHI 0.3 with median CPAP 6 cm H2O and 95 th percentile CPAP 9 cm H2O.  Will have my nurse inform pt that CPAP report looks much better.  No change to current set up.

## 2013-01-12 ENCOUNTER — Other Ambulatory Visit: Payer: Self-pay | Admitting: Family Medicine

## 2013-01-12 DIAGNOSIS — Z1231 Encounter for screening mammogram for malignant neoplasm of breast: Secondary | ICD-10-CM

## 2013-02-01 ENCOUNTER — Ambulatory Visit: Payer: Medicare Other

## 2013-02-10 ENCOUNTER — Encounter (HOSPITAL_COMMUNITY): Payer: Self-pay | Admitting: Emergency Medicine

## 2013-02-10 ENCOUNTER — Emergency Department (INDEPENDENT_AMBULATORY_CARE_PROVIDER_SITE_OTHER)
Admission: EM | Admit: 2013-02-10 | Discharge: 2013-02-10 | Disposition: A | Discharge status: Home / Self Care | Payer: Medicare Other | Source: Home / Self Care

## 2013-02-10 DIAGNOSIS — M79605 Pain in left leg: Secondary | ICD-10-CM

## 2013-02-10 DIAGNOSIS — M79609 Pain in unspecified limb: Secondary | ICD-10-CM

## 2013-02-10 MED ORDER — NAPROXEN 500 MG PO TABS: 500.0000 mg | ORAL_TABLET | Freq: Two times a day (BID) | ORAL | Status: DC

## 2013-02-10 MED ORDER — TRAMADOL HCL 50 MG PO TABS: 50.0000 mg | ORAL_TABLET | Freq: Four times a day (QID) | ORAL | Status: DC | PRN

## 2013-02-10 NOTE — ED Notes (Signed)
Pt c/o left hip pain onset Monday eve Reports she was lying on the bed when she felt the pain... Prior to this she was ok Sxs include: constant pain that increases w/activity and radiates down left leg Denies: inj/trauma.... Alert w/no signs of acute distress.

## 2013-02-10 NOTE — ED Provider Notes (Signed)
CSN: [id number]     Arrival date & time 02/10/13  1240 History   First MD Initiated Contact with Patient 02/10/13 1358     Chief Complaint  Patient presents with  . Hip Pain   (Consider location/radiation/quality/duration/timing/severity/associated sxs/prior Treatment) HPI Comments: 62 year old female presents complaining of left hip and leg pain that started Monday morning when she tried to push herself up out of bed. She has pain across her left hip and down her left leg that is worse with any stretching. She denies any specific injury or history of similar pain. She denies any swelling in the legs or history of DVT/PE.  Patient is a 62 y.o. female presenting with hip pain.  Hip Pain Pertinent negatives include no chest pain, no abdominal pain and no shortness of breath.    Past Medical History  Diagnosis Date  . Hypertension   . Diabetes mellitus without complication   . Sleep apnea     recently diagnosed  . Arthritis   . Headache(784.0)   . H/O hiatal hernia   . GERD (gastroesophageal reflux disease)    Past Surgical History  Procedure Laterality Date  . Tonsillectomy    . Abdominal hysterectomy      partial  . Cholecystectomy    . Left shoulder      x 2 surgeries  . Right shoulder      x2  . Back surgery    . Shoulder open rotator cuff repair Right 08/25/2012    Procedure: ROTATOR CUFF REPAIR SHOULDER OPEN WITH ACROMIOPLASTY;  Surgeon: Tobi Bastos, MD;  Location: [facility];  Service: Orthopedics;  Laterality: Right;   Family History  Problem Relation Age of Onset  . Colon cancer Father   . Colon cancer Paternal Grandfather   . Colon cancer Maternal Grandfather   . Bone cancer Maternal Uncle    History  Substance Use Topics  . Smoking status: Never Smoker   . Smokeless tobacco: Not on file  . Alcohol Use: Yes     Comment: rarely   OB History   Grav Para Term Preterm Abortions TAB SAB Ect Mult Living                 Review of Systems  Constitutional:  Negative for fever and chills.  Eyes: Negative for visual disturbance.  Respiratory: Negative for cough and shortness of breath.   Cardiovascular: Negative for chest pain, palpitations and leg swelling.  Gastrointestinal: Negative for nausea, vomiting and abdominal pain.  Endocrine: Negative for polydipsia and polyuria.  Genitourinary: Negative for dysuria, urgency and frequency.  Musculoskeletal: Positive for myalgias and arthralgias.  Skin: Negative for rash.  Neurological: Negative for dizziness, weakness and light-headedness.    Allergies  Review of patient's allergies indicates no known allergies.  Home Medications   Current Outpatient Rx  Name  Route  Sig  Dispense  Refill  . amLODipine (NORVASC) 5 MG tablet   Oral   Take 5 mg by mouth daily.         . cholecalciferol (VITAMIN D) 1000 UNITS tablet   Oral   Take 1,000 Units by mouth daily.         Marland Kitchen glimepiride (AMARYL) 2 MG tablet   Oral   Take 2 mg by mouth daily before breakfast.         . Liraglutide (VICTOZA) 18 MG/3ML SOLN injection   Subcutaneous   Inject 1.2 mg into the skin daily after breakfast.          .  omeprazole (PRILOSEC) 20 MG capsule   Oral   Take 20 mg by mouth daily.         Marland Kitchen HYDROcodone-acetaminophen (NORCO/VICODIN) 5-325 MG per tablet   Oral   Take 1-2 tablets by mouth every 4 (four) hours as needed.   80 tablet   0   . ibuprofen (ADVIL,MOTRIN) 200 MG tablet   Oral   Take 200 mg by mouth every 6 (six) hours as needed for pain.         . methocarbamol (ROBAXIN) 500 MG tablet   Oral   Take 1 tablet (500 mg total) by mouth every 6 (six) hours as needed.   40 tablet   1   . naproxen (NAPROSYN) 500 MG tablet   Oral   Take 1 tablet (500 mg total) by mouth 2 (two) times daily.   60 tablet   0   . traMADol (ULTRAM) 50 MG tablet   Oral   Take 1 tablet (50 mg total) by mouth every 6 (six) hours as needed for pain.   30 tablet   0    BP 121/87  Pulse 84  Temp(Src) 99  F (37.2 C) (Oral)  Resp 20  SpO2 98% Physical Exam  Nursing note and vitals reviewed. Constitutional: She is oriented to person, place, and time. She appears well-developed and well-nourished. No distress.  HENT:  Head: Normocephalic and atraumatic.  Musculoskeletal: Normal range of motion.       Left hip: She exhibits tenderness. She exhibits normal range of motion, normal strength, no bony tenderness, no swelling and no deformity.       Legs: Neurological: She is alert and oriented to person, place, and time. Coordination normal.  Skin: Skin is warm and dry. No rash noted. She is not diaphoretic.  Psychiatric: She has a normal mood and affect. Judgment normal.    ED Course  Procedures (including critical care time) Labs Review Labs Reviewed - No data to display Imaging Review No results found.  MDM   1. Leg pain, left    Musculoskeletal pain.  Treat symptomatically.  F/u if not improving    Meds ordered this encounter  Medications  . traMADol (ULTRAM) 50 MG tablet    Sig: Take 1 tablet (50 mg total) by mouth every 6 (six) hours as needed for pain.    Dispense:  30 tablet    Refill:  0  . naproxen (NAPROSYN) 500 MG tablet    Sig: Take 1 tablet (500 mg total) by mouth 2 (two) times daily.    Dispense:  60 tablet    Refill:  0       Liam Graham, PA-C 02/10/13 1450

## 2013-02-12 NOTE — ED Provider Notes (Signed)
Medical screening examination/treatment/procedure(s) were performed by a resident physician or non-physician practitioner and as the supervising physician I was immediately available for consultation/collaboration.  Lynne Leader, MD   Gregor Hams, MD 02/12/13 [id number]

## 2013-07-27 ENCOUNTER — Other Ambulatory Visit: Payer: Self-pay | Admitting: Internal Medicine

## 2013-07-27 DIAGNOSIS — E2839 Other primary ovarian failure: Secondary | ICD-10-CM

## 2013-08-17 ENCOUNTER — Ambulatory Visit: Payer: Medicare Other

## 2013-08-17 ENCOUNTER — Other Ambulatory Visit: Payer: Medicare Other

## 2014-07-23 ENCOUNTER — Emergency Department (HOSPITAL_COMMUNITY)
Admission: EM | Admit: 2014-07-23 | Discharge: 2014-07-23 | Disposition: A | Discharge status: Home / Self Care | Payer: Medicare Other | Attending: Emergency Medicine | Admitting: Emergency Medicine

## 2014-07-23 ENCOUNTER — Encounter (HOSPITAL_COMMUNITY): Payer: Self-pay | Admitting: *Deleted

## 2014-07-23 DIAGNOSIS — M1990 Unspecified osteoarthritis, unspecified site: Secondary | ICD-10-CM | POA: Insufficient documentation

## 2014-07-23 DIAGNOSIS — Z791 Long term (current) use of non-steroidal anti-inflammatories (NSAID): Secondary | ICD-10-CM | POA: Diagnosis not present

## 2014-07-23 DIAGNOSIS — I10 Essential (primary) hypertension: Secondary | ICD-10-CM | POA: Diagnosis not present

## 2014-07-23 DIAGNOSIS — E119 Type 2 diabetes mellitus without complications: Secondary | ICD-10-CM | POA: Diagnosis not present

## 2014-07-23 DIAGNOSIS — K219 Gastro-esophageal reflux disease without esophagitis: Secondary | ICD-10-CM | POA: Insufficient documentation

## 2014-07-23 DIAGNOSIS — H578 Other specified disorders of eye and adnexa: Secondary | ICD-10-CM | POA: Diagnosis present

## 2014-07-23 DIAGNOSIS — Z79899 Other long term (current) drug therapy: Secondary | ICD-10-CM | POA: Diagnosis not present

## 2014-07-23 DIAGNOSIS — Z8669 Personal history of other diseases of the nervous system and sense organs: Secondary | ICD-10-CM | POA: Insufficient documentation

## 2014-07-23 DIAGNOSIS — H109 Unspecified conjunctivitis: Secondary | ICD-10-CM | POA: Diagnosis not present

## 2014-07-23 DIAGNOSIS — H1033 Unspecified acute conjunctivitis, bilateral: Secondary | ICD-10-CM

## 2014-07-23 MED ORDER — ERYTHROMYCIN 5 MG/GM OP OINT: TOPICAL_OINTMENT | OPHTHALMIC | Status: DC

## 2014-07-23 NOTE — ED Notes (Signed)
PT reports reports  Eye drainage that started on WED. Marland Kitchen  Pt reports eye have dried drainage in the morning.

## 2014-07-23 NOTE — Discharge Instructions (Signed)

## 2014-07-23 NOTE — ED Provider Notes (Signed)
CSN: [id number]     Arrival date & time 07/23/14  1013 History   First MD Initiated Contact with Patient 07/23/14 1017     Chief Complaint  Patient presents with  . Eye Problem   The history is provided by the patient. No language interpreter was used.   This chart was scribed for non-physician practitioner Hyman Bible, PA-C, working with Tanna Furry, MD, by Thea Alken, ED Scribe. This patient was seen in room TR04C/TR04C and the patient's care was started at 10:22 AM.  [name] is a 64 y.o. female who presents to the Emergency Department complaining of left eye irritation. Pt states she woke up 4 days ago with dried crusty drainage to right eye. She reports 1 day later she woke up with crust and redness to left eye. She reports drainage to both eyes worse in left. She denies eye pain but does report irritation to left eye.  Denies vision changes.   Pt denies wearing contacts but does wear glasses.   No known contacts with Conjunctivitis.    Past Medical History  Diagnosis Date  . Hypertension   . Diabetes mellitus without complication   . Sleep apnea     recently diagnosed  . Arthritis   . Headache(784.0)   . H/O hiatal hernia   . GERD (gastroesophageal reflux disease)    Past Surgical History  Procedure Laterality Date  . Tonsillectomy    . Abdominal hysterectomy      partial  . Cholecystectomy    . Left shoulder      x 2 surgeries  . Right shoulder      x2  . Back surgery    . Shoulder open rotator cuff repair Right 08/25/2012    Procedure: ROTATOR CUFF REPAIR SHOULDER OPEN WITH ACROMIOPLASTY;  Surgeon: Tobi Bastos, MD;  Location: [facility];  Service: Orthopedics;  Laterality: Right;   Family History  Problem Relation Age of Onset  . Colon cancer Father   . Colon cancer Paternal Grandfather   . Colon cancer Maternal Grandfather   . Bone cancer Maternal Uncle    History  Substance Use Topics  . Smoking status: Never Smoker   . Smokeless tobacco: Not on  file  . Alcohol Use: Yes     Comment: rarely   OB History    No data available     Review of Systems  Constitutional: Negative for fever and chills.  Eyes: Positive for discharge, redness and itching. Negative for photophobia, pain and visual disturbance.    Allergies  Review of patient's allergies indicates no known allergies.  Home Medications   Prior to Admission medications   Medication Sig Start Date End Date Taking? Authorizing Provider  amLODipine (NORVASC) 5 MG tablet Take 5 mg by mouth daily.    Historical Provider, MD  cholecalciferol (VITAMIN D) 1000 UNITS tablet Take 1,000 Units by mouth daily.    Historical Provider, MD  glimepiride (AMARYL) 2 MG tablet Take 2 mg by mouth daily before breakfast.    Historical Provider, MD  HYDROcodone-acetaminophen (NORCO/VICODIN) 5-325 MG per tablet Take 1-2 tablets by mouth every 4 (four) hours as needed. 08/26/12   Amber Renelda Loma, PA-C  ibuprofen (ADVIL,MOTRIN) 200 MG tablet Take 200 mg by mouth every 6 (six) hours as needed for pain.    Historical Provider, MD  Liraglutide (VICTOZA) 18 MG/3ML SOLN injection Inject 1.2 mg into the skin daily after breakfast.     Historical Provider, MD  methocarbamol (ROBAXIN)  500 MG tablet Take 1 tablet (500 mg total) by mouth every 6 (six) hours as needed. 08/26/12   Amber Renelda Loma, PA-C  naproxen (NAPROSYN) 500 MG tablet Take 1 tablet (500 mg total) by mouth 2 (two) times daily. 02/10/13   Liam Graham, PA-C  omeprazole (PRILOSEC) 20 MG capsule Take 20 mg by mouth daily.    Historical Provider, MD  traMADol (ULTRAM) 50 MG tablet Take 1 tablet (50 mg total) by mouth every 6 (six) hours as needed for pain. 02/10/13   Freeman Caldron Baker, PA-C   BP 95/58 mmHg  Pulse 72  Temp(Src) 97.9 F (36.6 C) (Oral)  Resp 16  Ht 5\' 4"  (1.626 m)  Wt 200 lb (90.719 kg)  BMI 34.31 kg/m2  SpO2 100% Physical Exam  Constitutional: She is oriented to person, place, and time. She appears well-developed  and well-nourished. No distress.  HENT:  Head: Normocephalic and atraumatic.  Mouth/Throat: Oropharynx is clear and moist.  Eyes: EOM and lids are normal. Pupils are equal, round, and reactive to light. Lids are everted and swept, no foreign bodies found. No foreign body present in the right eye. No foreign body present in the left eye. Right conjunctiva is injected. Left conjunctiva is injected.  Sclera injected diffusely. EOMI Small amount of yellowish discharge of the left eye noted    Visual Acuity  Right Eye Distance: 20/25 Left Eye Distance: 20/25 Bilateral Distance: 20/25      Neck: Neck supple.  Cardiovascular: Normal rate, regular rhythm and normal heart sounds.  Exam reveals no gallop and no friction rub.   No murmur heard. Pulmonary/Chest: Effort normal and breath sounds normal. No respiratory distress. She has no wheezes. She has no rales.  Musculoskeletal: Normal range of motion.  Neurological: She is alert and oriented to person, place, and time.  Skin: Skin is warm and dry.  Psychiatric: She has a normal mood and affect. Her behavior is normal.  Nursing note and vitals reviewed.  Filed Vitals:   07/23/14 1025  BP: 95/58  Pulse: 72  Temp: 97.9 F (36.6 C)  TempSrc: Oral  Resp: 16  Height: 5\' 4"  (1.626 m)  Weight: 200 lb (90.719 kg)  SpO2: 100%     ED Course  Procedures (including critical care time) DIAGNOSTIC STUDIES: Oxygen Saturation is 100% on RA, normal by my interpretation.    COORDINATION OF CARE: 11:00 AM- Pt advised of plan for treatment and pt agrees.  Labs Review Labs Reviewed - No data to display  Imaging Review No results found.   EKG Interpretation None      MDM   Final diagnoses:  None   Patient presents today with irritation and redness of both eyes.  She also reports crusting and drainage.  She reports that symptoms began in the right eye and then moved to the left eye.  No changes in vision.  No eye pain.  No injury or  trauma to the eye.  Symptoms most consistent with Bacterial Conjunctivitis.  Patient given Rx for Erythromycin ophthalmic ointment.  Patient also given follow up with Ophthalmology if symptoms do not improve.  I personally performed the services described in this documentation, which was scribed in my presence. The recorded information has been reviewed and is accurate.    Hyman Bible, PA-C 07/23/14 1148  Tanna Furry, MD 07/29/14 2253

## 2014-09-28 ENCOUNTER — Telehealth: Payer: Self-pay | Admitting: *Deleted

## 2014-09-28 NOTE — Telephone Encounter (Signed)
Patient referred to our office for an abnormal pap sear. Patient advised of procedure and how it is preformed. Patient verbalized understanding. Patient has been scheduled for 10-09-14 @ 10:30 am.

## 2014-10-09 ENCOUNTER — Encounter: Payer: Self-pay | Admitting: Obstetrics

## 2014-10-09 ENCOUNTER — Ambulatory Visit (INDEPENDENT_AMBULATORY_CARE_PROVIDER_SITE_OTHER): Payer: Medicare Other | Admitting: Obstetrics

## 2014-10-09 VITALS — BP 135/76 | HR 109 | Temp 99.2°F | Ht 64.0 in | Wt 198.0 lb

## 2014-10-09 DIAGNOSIS — R87623 High grade squamous intraepithelial lesion on cytologic smear of vagina (HGSIL): Secondary | ICD-10-CM | POA: Diagnosis not present

## 2014-10-09 NOTE — Progress Notes (Signed)
Colposcopy Procedure Note  Indications:  The patient is S/P Abdominal Hysterectomy many years ago for benign indications of painful, heavy periods.  Vaginal cuff pap smear 3 weeks ago showed: high-grade squamous intraepithelial neoplasia  (HGSIL-encompassing moderate and severe dysplasia).  The prior paps showed no abnormalities.  Prior cervical/vaginal disease: normal exam without visible pathology. Prior cervical treatment: no treatment.  Procedure Details  The risks and benefits of the procedure and Written informed consent obtained.  A time-out was performed confirming the patient, procedure and allergy status  Speculum placed in vagina and excellent visualization of cervix achieved, cervix swabbed x 3 with acetic acid solution.  Findings:  Vaginal Colposcopy Cervix: absent Vaginal inspection: normal without visible lesions. Vulvar colposcopy: vulvar colposcopy not performed.   Physical Exam:  WNL's   Specimens: Wet prep and vaginal cuff pap  Complications: none.  Plan: Will call patient with results and plan.

## 2014-10-11 LAB — PAP IG AND HPV HIGH-RISK: HPV DNA High Risk: DETECTED — AB

## 2014-10-11 LAB — SURESWAB BACTERIAL VAGINOSIS/ITIS
Atopobium vaginae: NOT DETECTED Log (cells/mL)
C. albicans, DNA: NOT DETECTED
C. glabrata, DNA: NOT DETECTED
C. parapsilosis, DNA: NOT DETECTED
C. tropicalis, DNA: NOT DETECTED
Gardnerella vaginalis: <4.7 Log (cells/mL)
LACTOBACILLUS SPECIES: >8 Log (cells/mL)
MEGASPHAERA SPECIES: NOT DETECTED Log (cells/mL)
T. vaginalis RNA, QL TMA: NOT DETECTED

## 2014-10-20 ENCOUNTER — Encounter: Payer: Self-pay | Admitting: Obstetrics

## 2014-10-20 ENCOUNTER — Ambulatory Visit (INDEPENDENT_AMBULATORY_CARE_PROVIDER_SITE_OTHER): Payer: Medicare Other | Admitting: Obstetrics

## 2014-10-20 VITALS — BP 102/64 | HR 82 | Temp 98.6°F | Wt 201.0 lb

## 2014-10-20 DIAGNOSIS — R87623 High grade squamous intraepithelial lesion on cytologic smear of vagina (HGSIL): Secondary | ICD-10-CM

## 2014-10-20 NOTE — Progress Notes (Signed)
Patient ID: [name], female   DOB: 1950/08/06, 64 y.o.   MRN: [id number]  Chief Complaint  Patient presents with  . Advice Only    pathology results    HPI [name] is a 64 y.o. female.  Presents for colposcopy / pap smear results.  HPI  Past Medical History  Diagnosis Date  . Hypertension   . Diabetes mellitus without complication   . Sleep apnea     recently diagnosed  . Arthritis   . Headache(784.0)   . H/O hiatal hernia   . GERD (gastroesophageal reflux disease)     Past Surgical History  Procedure Laterality Date  . Tonsillectomy    . Abdominal hysterectomy      partial  . Cholecystectomy    . Left shoulder      x 2 surgeries  . Right shoulder      x2  . Back surgery    . Shoulder open rotator cuff repair Right 08/25/2012    Procedure: ROTATOR CUFF REPAIR SHOULDER OPEN WITH ACROMIOPLASTY;  Surgeon: Tobi Bastos, MD;  Location: [facility];  Service: Orthopedics;  Laterality: Right;    Family History  Problem Relation Age of Onset  . Colon cancer Father   . Colon cancer Paternal Grandfather   . Colon cancer Maternal Grandfather   . Bone cancer Maternal Uncle     Social History History  Substance Use Topics  . Smoking status: Never Smoker   . Smokeless tobacco: Not on file  . Alcohol Use: 0.0 oz/week    0 Standard drinks or equivalent per week     Comment: rarely    No Known Allergies  Current Outpatient Prescriptions  Medication Sig Dispense Refill  . amLODipine (NORVASC) 5 MG tablet Take 5 mg by mouth daily.    Marland Kitchen glimepiride (AMARYL) 2 MG tablet Take 4 mg by mouth daily before breakfast.     . Liraglutide (VICTOZA) 18 MG/3ML SOLN injection Inject 1.8 mg into the skin daily after breakfast.     . lisinopril (PRINIVIL,ZESTRIL) 5 MG tablet Take 5 mg by mouth daily.    . metFORMIN (GLUCOPHAGE) 500 MG tablet Take 500 mg by mouth 2 (two) times daily with a meal.    . methocarbamol (ROBAXIN) 500 MG tablet Take 1 tablet (500 mg total)  by mouth every 6 (six) hours as needed. (Patient taking differently: Take 500 mg by mouth every 6 (six) hours as needed for muscle spasms. ) 40 tablet 1   No current facility-administered medications for this visit.    Review of Systems Review of Systems Constitutional: negative for fatigue and weight loss Respiratory: negative for cough and wheezing Cardiovascular: negative for chest pain, fatigue and palpitations Gastrointestinal: negative for abdominal pain and change in bowel habits Genitourinary:negative Integument/breast: negative for nipple discharge Musculoskeletal:negative for myalgias Neurological: negative for gait problems and tremors Behavioral/Psych: negative for abusive relationship, depression Endocrine: negative for temperature intolerance     Blood pressure 102/64, pulse 82, temperature 98.6 F (37 C), weight 201 lb (91.173 kg).  Physical Exam Physical Exam: Deferred  100% of 10 min visit spent on counseling and coordination of care.   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 All questions answered to patient's satisfaction.  No orders of the defined types were placed in this  encounter.   No orders of the defined types were placed in this encounter.

## 2014-10-25 ENCOUNTER — Ambulatory Visit (INDEPENDENT_AMBULATORY_CARE_PROVIDER_SITE_OTHER): Payer: Medicare Other

## 2014-10-25 ENCOUNTER — Ambulatory Visit (INDEPENDENT_AMBULATORY_CARE_PROVIDER_SITE_OTHER): Payer: Medicare Other | Admitting: Podiatry

## 2014-10-25 DIAGNOSIS — E1149 Type 2 diabetes mellitus with other diabetic neurological complication: Secondary | ICD-10-CM

## 2014-10-25 DIAGNOSIS — R52 Pain, unspecified: Secondary | ICD-10-CM

## 2014-10-25 DIAGNOSIS — E1140 Type 2 diabetes mellitus with diabetic neuropathy, unspecified: Secondary | ICD-10-CM | POA: Diagnosis not present

## 2014-10-25 MED ORDER — GABAPENTIN 100 MG PO CAPS: 100.0000 mg | ORAL_CAPSULE | Freq: Every day | ORAL | Status: DC

## 2014-10-25 MED ORDER — GABAPENTIN 100 MG PO CAPS: 100.0000 mg | ORAL_CAPSULE | Freq: Three times a day (TID) | ORAL | Status: DC

## 2014-10-25 NOTE — Patient Instructions (Signed)
You can start gabapentin 100mg  at night for one week.  On week two if tolerating can take 200mg  at night if tolerating.  On week three can take 300mg  at night if tolerating.

## 2014-10-25 NOTE — Progress Notes (Signed)
   Subjective:    Patient ID: [name], female    DOB: 13-Apr-1951, 64 y.o.   MRN: [id number]  HPI  64 year old female presents the office today with complaints of burning, tunneling, numbness to both of her feet which has been ongoing for quite some time. She denies any history of injury or trauma. Denies any swelling or redness. She has diabetic and she states her last blood sugar was 97 today. Denies any history of ulceration. No other complaints at this time.  Review of Systems  All other systems reviewed and are negative.      Objective:   Physical Exam AAO 3, NAD DP/PT pulses palpable, CRT less than 3 seconds Protective sensation decreased with Simms Weinstein monofilament, vibratory sensation intact, Achilles tendon reflex intact. No open lesions or pre-ulcerative lesions identified bilaterally. No areas of tenderness to bilateral lower extremities. No overlying edema, erythema, increase in warmth. MMT 5/5, ROM WNL No pain with calf compression, swelling, warmth, erythema.     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 -Treatment options were discussed with the patient could alternatives, risks, complications.  -Discussed importance daily foot inspection with the patient. Recommend her to call the office immediately should there be any changes. For neuropathy and given her symptoms that he would that she would benefit from starting gabapentin. Will start gabapentin 100 mg at nighttime and titrate up as tolerated. Side effects the medication were discussed with the patient.  -Follow-up in 4 weeks or sooner if any problems are to arise. In the meantime call the office with any questions, concerns, change in symptoms

## 2014-10-30 ENCOUNTER — Encounter: Payer: Self-pay | Admitting: Gynecology

## 2014-10-30 ENCOUNTER — Ambulatory Visit: Payer: Medicare Other | Attending: Gynecology | Admitting: Gynecology

## 2014-10-30 VITALS — BP 136/66 | HR 93 | Temp 98.3°F | Resp 18 | Ht 64.0 in | Wt 196.5 lb

## 2014-10-30 DIAGNOSIS — R87613 High grade squamous intraepithelial lesion on cytologic smear of cervix (HGSIL): Secondary | ICD-10-CM | POA: Diagnosis present

## 2014-10-30 DIAGNOSIS — N952 Postmenopausal atrophic vaginitis: Secondary | ICD-10-CM

## 2014-10-30 DIAGNOSIS — IMO0002 Reserved for concepts with insufficient information to code with codable children: Secondary | ICD-10-CM

## 2014-10-30 DIAGNOSIS — R896 Abnormal cytological findings in specimens from other organs, systems and tissues: Secondary | ICD-10-CM | POA: Diagnosis present

## 2014-10-30 DIAGNOSIS — Z90710 Acquired absence of both cervix and uterus: Secondary | ICD-10-CM

## 2014-10-30 MED ORDER — ESTROGENS, CONJUGATED 0.625 MG/GM VA CREA: TOPICAL_CREAM | VAGINAL | Status: DC

## 2014-10-30 NOTE — Progress Notes (Signed)
Consult Note: Gyn-Onc   [name] 64 y.o. female  Chief Complaint  Patient presents with  . HGSIL    New patient    Assessment :HGSIL Pap smear. No obvious lesion on colposcopic examination today.Vaginal atrophy.  Plan:the patient's given a prescription for Premarin vaginal cream to be used 1/2 g 3 times a week at bedtime. She return to see me in 3 months for repeat colposcopy.   HPI:53-year-old seen in consultation at the request Dr. Baltazar Najjar for evaluation of a Pap smear showing HGSIL. The patient has previously been evaluated by Dr. Jodi Mourning including colposcopy. At that time no lesion was noted. 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  Patient has a past history of undergoing a hysterectomy for menorrhagia. She reports that she has never had an abnormal Pap smear before or after the hysterectomy.  She has no other past significant gynecologic history. She comes in, he by her daughter today.  Review of Systems:10 point review of systems is negative except as noted in interval history.   Vitals: Blood pressure 136/66, pulse 93, temperature 98.3 F (36.8 C), resp. rate 18, height 5\' 4"  (1.626 m), weight 196 lb 8 oz (89.132 kg).  Physical Exam: General : The patient is a healthy woman in no acute distress.  HEENT: normocephalic, extraoccular movements normal; neck is supple without thyromegally  Lynphnodes: Supraclavicular and inguinal nodes not enlarged  Abdomen: Soft, non-tender, no ascites, no organomegally, no masses, no hernias  Pelvic:  EGBUS: Normal female  Vagina: Normal, no lesions  Urethra and Bladder: Normal, non-tender  Cervix: Surgically absent  Uterus: Surgically absent  Bi-manual examination: Non-tender; no adenxal masses or nodularity  Rectal: normal sphincter tone, no masses, no blood  Lower extremities: No edema or varicosities. Normal range of motion   Procedure note:colposcopy of the  entire vagina is performed using acetic acid followed by Lugol's solution. No lesions are noted.     No Known Allergies  Past Medical History  Diagnosis Date  . Hypertension   . Diabetes mellitus without complication   . Sleep apnea     recently diagnosed  . Arthritis   . Headache(784.0)   . H/O hiatal hernia   . GERD (gastroesophageal reflux disease)     Past Surgical History  Procedure Laterality Date  . Tonsillectomy    . Abdominal hysterectomy      partial  . Cholecystectomy    . Left shoulder      x 2 surgeries  . Right shoulder      x2  . Back surgery    . Shoulder open rotator cuff repair Right 08/25/2012    Procedure: ROTATOR CUFF REPAIR SHOULDER OPEN WITH ACROMIOPLASTY;  Surgeon: Tobi Bastos, MD;  Location: [facility];  Service: Orthopedics;  Laterality: Right;    Current Outpatient Prescriptions  Medication Sig Dispense Refill  . amLODipine (NORVASC) 5 MG tablet Take 5 mg by mouth daily.    Marland Kitchen gabapentin (NEURONTIN) 100 MG capsule Take 1 capsule (100 mg total) by mouth at bedtime. 90 capsule 3  . glimepiride (AMARYL) 2 MG tablet Take 4 mg by mouth daily before breakfast.     . Liraglutide (VICTOZA) 18 MG/3ML SOLN injection Inject 1.8 mg into the skin daily after breakfast.     . lisinopril (PRINIVIL,ZESTRIL) 5 MG tablet Take 5 mg by mouth daily.    . metFORMIN (GLUCOPHAGE) 500 MG tablet Take 500 mg by mouth  2 (two) times daily with a meal.    . methocarbamol (ROBAXIN) 500 MG tablet Take 1 tablet (500 mg total) by mouth every 6 (six) hours as needed. (Patient taking differently: Take 500 mg by mouth every 6 (six) hours as needed for muscle spasms. ) 40 tablet 1  . tiZANidine (ZANAFLEX) 4 MG tablet      No current facility-administered medications for this visit.    History   Social History  . Marital Status: Divorced    Spouse Name: N/A  . Number of Children: 3  . Years of Education: N/A   Occupational History  . [employer]   Social History  Main Topics  . Smoking status: Never Smoker   . Smokeless tobacco: Not on file  . Alcohol Use: No     Comment: rarely  . Drug Use: No  . Sexual Activity: Not Currently   Other Topics Concern  . Not on file   Social History Narrative    Family History  Problem Relation Age of Onset  . Colon cancer Father   . Colon cancer Paternal Grandfather   . Colon cancer Maternal Grandfather   . Bone cancer Maternal Uncle       CLARKE-PEARSON,Darol Cush L, MD 10/30/2014, 10:00 AM

## 2014-10-30 NOTE — Patient Instructions (Signed)
Plan to followup with Dr. Fermin Schwab in 3 months as scheduled above. Use the Premarin vaginal cream as prescribed. Please call us with any questions or concerns prior to your next visit.

## 2014-11-23 ENCOUNTER — Telehealth: Payer: Self-pay | Admitting: *Deleted

## 2014-11-23 NOTE — Telephone Encounter (Signed)
OK. That's fine. Is she taking 100mg  TID or just once a day? If she is taking 100mg  TID then she can go up to doing 100mg  in the morning and lunch then increase the night dose.

## 2014-11-23 NOTE — Telephone Encounter (Signed)
If she is tolerating it ok she can go up to 200mg  at night for one week. After that if still not better can go up to 300mg  at night if tolerating. Thanks.

## 2014-11-23 NOTE — Telephone Encounter (Addendum)
Pt states the Gabapentin 100mg  daily is not helping her pain.  I spoke with pt, and was informed her medication bottle states GABAPENTIN 100MG  3 TIMES A DAY, while our orders state Gabapentin 100 mg at bedtime.  Dr. Jacqualyn Posey states Gabapentin 100mg  1 tablet at breakfast, 1 tablet at lunch and 2 tablets at bedtime for 1 week and may increase to 3 tablets at bedtime.  Dr. Jacqualyn Posey ordered Gabapentin 100 mg # 150 refills x3.

## 2014-11-29 ENCOUNTER — Encounter: Payer: Self-pay | Admitting: Podiatry

## 2014-11-29 ENCOUNTER — Ambulatory Visit (INDEPENDENT_AMBULATORY_CARE_PROVIDER_SITE_OTHER): Payer: Medicare Other | Admitting: Podiatry

## 2014-11-29 VITALS — BP 111/64 | HR 70 | Resp 15

## 2014-11-29 DIAGNOSIS — E1149 Type 2 diabetes mellitus with other diabetic neurological complication: Secondary | ICD-10-CM

## 2014-11-29 DIAGNOSIS — E1140 Type 2 diabetes mellitus with diabetic neuropathy, unspecified: Secondary | ICD-10-CM | POA: Diagnosis not present

## 2014-11-29 MED ORDER — PREGABALIN 75 MG PO CAPS: 75.0000 mg | ORAL_CAPSULE | Freq: Two times a day (BID) | ORAL | Status: DC

## 2014-12-07 ENCOUNTER — Telehealth: Payer: Self-pay | Admitting: *Deleted

## 2014-12-07 MED ORDER — PREGABALIN 75 MG PO CAPS: ORAL_CAPSULE | ORAL | Status: DC

## 2014-12-07 NOTE — Telephone Encounter (Addendum)
Pt states Dr. Jacqualyn Posey prescribed Lyrica, but only prescribed 90 pills, and was instructed to up daily dosage as tolerated.  Pt is currently taking 4 Lyrica a day.  I informed pt of Dr. Leigh Aurora Lyrica 75mg  150mg  in the morning and 150mg  in the evening and up dated the Lyrica rx.

## 2014-12-07 NOTE — Telephone Encounter (Signed)
Entered in error

## 2014-12-07 NOTE — Telephone Encounter (Signed)
She should take max 300mg  per day. So 150mg  in the morning and 150mg  at night. I prescribed 75mg  to start so 4 pills would be max. Thanks.

## 2014-12-09 NOTE — Progress Notes (Signed)
Patient ID: [name], female   DOB: Aug 18, 1950, 64 y.o.   MRN: [id number]  Subjective: 64 year old female presents the office they for follow-up evaluation of neuropathy. She states that she continues a burning, tingling, numbness with her feet. She has increased her gabapentin sees, taking 400 mg total during the day will. She stated 100 mg in the morning and at lunch time and 200 mg at nighttime. She states that she is tolerating medication well any complications. No other complaints this time in no acute changes since last appointment.  Objective: AAO 3, NAD DP/PT pulses palpable, CRT less than 3 seconds Protective sensation decreased with Simms Weinstein monofilament, Achilles tendon reflex intact. There is no areas of tenderness to bilateral lower extremities. No overlying edema, erythema, increased warmth. MMT 5/5, ROM WNL No open lesions or pre-ulcerative lesions identified bilaterally. No pain with calf pain with compression, swelling, warmth, erythema.   Assessment: 64 year old female with neuropathy  Plan: -Treatment options discussed including all alternatives, risks, and complications -Can increase gabapentin slowely up to 300mg  TID. I discussed wither her how to do this. If she is unable to tolerate this to call the office.  -Follow-up in 4 weeks. If at that time she continues to not have any relief of symptoms, will either further increase the gabapentin further or switch to lyrica. In the meantime and cursorily office with any questions, concerns, changes symptoms.

## 2014-12-28 ENCOUNTER — Telehealth: Payer: Self-pay | Admitting: *Deleted

## 2014-12-28 NOTE — Telephone Encounter (Signed)
Entered in error

## 2014-12-28 NOTE — Telephone Encounter (Signed)
Pt requested Lyrica be prior authorized by Medicare.  I left pt message informing her I could go no further in getting Prior Authorization for Lyrica without her Medicare part D plan card.

## 2014-12-29 ENCOUNTER — Telehealth: Payer: Self-pay | Admitting: *Deleted

## 2014-12-29 ENCOUNTER — Ambulatory Visit (INDEPENDENT_AMBULATORY_CARE_PROVIDER_SITE_OTHER): Payer: Medicare Other | Admitting: Podiatry

## 2014-12-29 ENCOUNTER — Encounter: Payer: Self-pay | Admitting: Podiatry

## 2014-12-29 VITALS — BP 130/108 | HR 64 | Resp 18

## 2014-12-29 DIAGNOSIS — E1149 Type 2 diabetes mellitus with other diabetic neurological complication: Secondary | ICD-10-CM

## 2014-12-29 DIAGNOSIS — E1140 Type 2 diabetes mellitus with diabetic neuropathy, unspecified: Secondary | ICD-10-CM

## 2014-12-29 MED ORDER — PREGABALIN 150 MG PO CAPS: 150.0000 mg | ORAL_CAPSULE | Freq: Two times a day (BID) | ORAL | Status: DC

## 2014-12-29 NOTE — Telephone Encounter (Signed)
Pt states she would like to continue Gabapentin, but a higher dose, because she can't get the Medicare Drug Card.  Dr. Jacqualyn Posey had recommended 12/09/2014 slowly increasing Gabapentin 100mg  gradually.  I informed pt she should start with Gabapentin 100mg  2 capsules at bedtime tonight and over the weekend then call Monday with status and I would check with Dr. Jacqualyn Posey again for further instructions.

## 2015-01-01 ENCOUNTER — Telehealth: Payer: Self-pay | Admitting: *Deleted

## 2015-01-01 NOTE — Telephone Encounter (Signed)
She was on gabapentin 300mg  TID. She can continue this and work up to 600mg  TID.

## 2015-01-01 NOTE — Progress Notes (Signed)
Patient ID: BRANNA NIBBE, female   DOB: 1950/11/24, 64 y.o.   MRN: [id number]  Subjective: 64 year old female presents the office they for follow-up evaluation of neuropathy. She has transitioned to lyrica and not noticed much improved. She does state there is minimal improvement. She has been t tolerating the medication well any complications. No other complaints this time in no acute changes since last appointment.  Objective: AAO 3, NAD DP/PT pulses palpable, CRT less than 3 seconds Protective sensation decreased with Simms Weinstein monofilament, Achilles tendon reflex intact. There are no areas of tenderness to bilateral lower extremities. No overlying edema, erythema, increased warmth. MMT 5/5, ROM WNL No open lesions or pre-ulcerative lesions identified bilaterally. No pain with calf pain with compression, swelling, warmth, erythema.   Assessment: 64 year old female with neuropathy  Plan: -Treatment options discussed including all alternatives, risks, and complications -Recommended increase to lyrica 150mg . This was given to her.  -Follow-up in 4 weeks. If at that time she continues to not have any relief of symptoms, will either further increase the gabapentin further or switch to lyrica. In the meantime and cursorily office with any questions, concerns, changes symptoms.  Celesta Gentile, DPM

## 2015-01-01 NOTE — Telephone Encounter (Addendum)
Pt states Gabapentin 100mg  2 tablets at bedtime did not help, and would like to have Dr. Jacqualyn Posey increase the Gabapentin.  Left message requesting pt call our office for new dosing of Gabapentin to 100mg  3 tablets tid then work up to 600mg  tid.  Pt called and I gave the instructions to begin Gabapentin 300mg  tid, then to increase gradually to 600mg  tid.  Pt states understanding.

## 2015-01-02 ENCOUNTER — Other Ambulatory Visit: Payer: Self-pay | Admitting: Gynecology

## 2015-01-15 DIAGNOSIS — K099 Cyst of oral region, unspecified: Secondary | ICD-10-CM | POA: Diagnosis not present

## 2015-01-15 DIAGNOSIS — D480 Neoplasm of uncertain behavior of bone and articular cartilage: Secondary | ICD-10-CM | POA: Diagnosis not present

## 2015-01-15 DIAGNOSIS — L438 Other lichen planus: Secondary | ICD-10-CM | POA: Diagnosis not present

## 2015-01-19 DIAGNOSIS — L438 Other lichen planus: Secondary | ICD-10-CM | POA: Diagnosis not present

## 2015-01-24 ENCOUNTER — Telehealth: Payer: Self-pay | Admitting: Nurse Practitioner

## 2015-01-24 NOTE — Telephone Encounter (Signed)
Patient calls clinic wanting to cancel 8/26 f/u with Dr. Fermin Schwab. She states she will call back when she is ready to reschedule. Apt cancelled per request.

## 2015-01-26 ENCOUNTER — Ambulatory Visit: Payer: Medicare Other | Admitting: Gynecology

## 2015-02-09 ENCOUNTER — Ambulatory Visit: Payer: Medicare Other | Admitting: Gynecology

## 2015-02-09 ENCOUNTER — Ambulatory Visit (INDEPENDENT_AMBULATORY_CARE_PROVIDER_SITE_OTHER): Payer: Commercial Managed Care - HMO | Admitting: Podiatry

## 2015-02-09 ENCOUNTER — Encounter: Payer: Self-pay | Admitting: Podiatry

## 2015-02-09 VITALS — BP 115/61 | HR 64 | Resp 14

## 2015-02-09 DIAGNOSIS — E1140 Type 2 diabetes mellitus with diabetic neuropathy, unspecified: Secondary | ICD-10-CM | POA: Diagnosis not present

## 2015-02-09 DIAGNOSIS — E1149 Type 2 diabetes mellitus with other diabetic neurological complication: Secondary | ICD-10-CM

## 2015-02-09 DIAGNOSIS — M722 Plantar fascial fibromatosis: Secondary | ICD-10-CM | POA: Diagnosis not present

## 2015-02-09 MED ORDER — GABAPENTIN 300 MG PO CAPS: 300.0000 mg | ORAL_CAPSULE | Freq: Three times a day (TID) | ORAL | Status: DC

## 2015-02-09 NOTE — Patient Instructions (Signed)
Diabetic Neuropathy Diabetic neuropathy is a nerve disease or nerve damage that is caused by diabetes mellitus. About half of all people with diabetes mellitus have some form of nerve damage. Nerve damage is more common in those who have had diabetes mellitus for many years and who generally have not had good control of their blood sugar (glucose) level. Diabetic neuropathy is a common complication of diabetes mellitus. There are three more common types of diabetic neuropathy and a fourth type that is less common and less understood:   Peripheral neuropathy--This is the most common type of diabetic neuropathy. It causes damage to the nerves of the feet and legs first and then eventually the hands and arms.The damage affects the ability to sense touch.  Autonomic neuropathy--This type causes damage to the autonomic nervous system, which controls the following functions:  Heartbeat.  Body temperature.  Blood pressure.  Urination.  Digestion.  Sweating.  Sexual function.  Focal neuropathy--Focal neuropathy can be painful and unpredictable and occurs most often in older adults with diabetes mellitus. It involves a specific nerve or one area and often comes on suddenly. It usually does not cause long-term problems.  Radiculoplexus neuropathy-- Sometimes called lumbosacral radiculoplexus neuropathy, radiculoplexus neuropathy affects the nerves of the thighs, hips, buttocks, or legs. It is more common in people with type 2 diabetes mellitus and in older men. It is characterized by debilitating pain, weakness, and atrophy, usually in the thigh muscles. CAUSES  The cause of peripheral, autonomic, and focal neuropathies is diabetes mellitus that is uncontrolled and high glucose levels. The cause of radiculoplexus neuropathy is unknown. However, it is thought to be caused by inflammation related to uncontrolled glucose levels. SIGNS AND SYMPTOMS  Peripheral Neuropathy Peripheral neuropathy develops  slowly over time. When the nerves of the feet and legs no longer work there may be:   Burning, stabbing, or aching pain in the legs or feet.  Inability to feel pressure or pain in your feet. This can lead to:  Thick calluses over pressure areas.  Pressure sores.  Ulcers.  Foot deformities.  Reduced ability to feel temperature changes.  Muscle weakness. Autonomic Neuropathy The symptoms of autonomic neuropathy vary depending on which nerves are affected. Symptoms may include:  Problems with digestion, such as:  Feeling sick to your stomach (nausea).  Vomiting.  Bloating.  Constipation.  Diarrhea.  Abdominal pain.  Difficulty with urination. This occurs if you lose your ability to sense when your bladder is full. Problems include:  Urine leakage (incontinence).  Inability to empty your bladder completely (retention).  Rapid or irregular heartbeat (palpitations).  Blood pressure drops when you stand up (orthostatic hypotension). When you stand up you may feel:  Dizzy.  Weak.  Faint.  In men, inability to attain and maintain an erection.  In women, vaginal dryness and problems with decreased sexual desire and arousal.  Problems with body temperature regulation.  Increased or decreased sweating. Focal Neuropathy  Abnormal eye movements or abnormal alignment of both eyes.  Weakness in the wrist.  Foot drop. This results in an inability to lift the foot properly and abnormal walking or foot movement.  Paralysis on one side of your face (Bell palsy).  Chest or abdominal pain. Radiculoplexus Neuropathy  Sudden, severe pain in your hip, thigh, or buttocks.  Weakness and wasting of thigh muscles.  Difficulty rising from a seated position.  Abdominal swelling.  Unexplained weight loss (usually more than 10 lb [4.5 kg]). DIAGNOSIS  Peripheral Neuropathy Your senses may   be tested. Sensory function testing can be done with:  A light touch using a  monofilament.  A vibration with tuning fork.  A sharp sensation with a pin prick. Other tests that can help diagnose neuropathy are:  Nerve conduction velocity. This test checks the transmission of an electrical current through a nerve.  Electromyography. This shows how muscles respond to electrical signals transmitted by nearby nerves.  Quantitative sensory testing. This is used to assess how your nerves respond to vibrations and changes in temperature. Autonomic Neuropathy Diagnosis is often based on reported symptoms. Tell your health care provider if you experience:   Dizziness.   Constipation.   Diarrhea.   Inappropriate urination or inability to urinate.   Inability to get or maintain an erection.  Tests that may be done include:   Electrocardiography or Holter monitor. These are tests that can help show problems with the heart rate or heart rhythm.   An X-ray exam may be done. Focal Neuropathy Diagnosis is made based on your symptoms and what your health care provider finds during your exam. Other tests may be done. They may include:  Nerve conduction velocities. This checks the transmission of electrical current through a nerve.  Electromyography. This shows how muscles respond to electrical signals transmitted by nearby nerves.  Quantitative sensory testing. This test is used to assess how your nerves respond to vibration and changes in temperature. Radiculoplexus Neuropathy  Often the first thing is to eliminate any other issue or problems that might be the cause, as there is no stick test for diagnosis.  X-ray exam of your spine and lumbar region.  Spinal tap to rule out cancer.  MRI to rule out other lesions. TREATMENT  Once nerve damage occurs, it cannot be reversed. The goal of treatment is to keep the disease or nerve damage from getting worse and affecting more nerve fibers. Controlling your blood glucose level is the key. Most people with  radiculoplexus neuropathy see at least a partial improvement over time. You will need to keep your blood glucose and HbA1c levels in the target range determined by your health care provider. Things that help control blood glucose levels include:   Blood glucose monitoring.   Meal planning.   Physical activity.   Diabetes medicine.  Over time, maintaining lower blood glucose levels helps lessen symptoms. Sometimes, prescription pain medicine is needed. HOME CARE INSTRUCTIONS:  Do not smoke.  Keep your blood glucose level in the range that you and your health care provider have determined acceptable for you.  Keep your blood pressure level in the range that you and your health care provider have determined acceptable for you.  Eat a well-balanced diet.  Be active every day.  Check your feet every day. SEEK MEDICAL CARE IF:   You have burning, stabbing, or aching pain in the legs or feet.  You are unable to feel pressure or pain in your feet.  You develop problems with digestion such as:  Nausea.  Vomiting.  Bloating.  Constipation.  Diarrhea.  Abdominal pain.  You have difficulty with urination, such as:  Incontinence.  Retention.  You have palpitations.  You develop orthostatic hypotension. When you stand up you may feel:  Dizzy.  Weak.  Faint.  You cannot attain and maintain an erection (in men).  You have vaginal dryness and problems with decreased sexual desire and arousal (in women).  You have severe pain in your thighs, legs, or buttocks.  You have unexplained weight loss.   Document Released: 08/11/2001 Document Revised: 03/23/2013 Document Reviewed: 11/11/2012 [facility] Patient Information 2015 [place], [place]. This information is not intended to replace advice given to you by your health care provider. Make sure you discuss any questions you have with your health care provider. Plantar Fasciitis (Heel Spur Syndrome) with Rehab The plantar  fascia is a fibrous, ligament-like, soft-tissue structure that spans the bottom of the foot. Plantar fasciitis is a condition that causes pain in the foot due to inflammation of the tissue. SYMPTOMS   Pain and tenderness on the underneath side of the foot.  Pain that worsens with standing or walking. CAUSES  Plantar fasciitis is caused by irritation and injury to the plantar fascia on the underneath side of the foot. Common mechanisms of injury include:  Direct trauma to bottom of the foot.  Damage to a small nerve that runs under the foot where the main fascia attaches to the heel bone.  Stress placed on the plantar fascia due to bone spurs. RISK INCREASES WITH:   Activities that place stress on the plantar fascia (running, jumping, pivoting, or cutting).  Poor strength and flexibility.  Improperly fitted shoes.  Tight calf muscles.  Flat feet.  Failure to warm-up properly before activity.  Obesity. PREVENTION  Warm up and stretch properly before activity.  Allow for adequate recovery between workouts.  Maintain physical fitness:  Strength, flexibility, and endurance.  Cardiovascular fitness.  Maintain a health body weight.  Avoid stress on the plantar fascia.  Wear properly fitted shoes, including arch supports for individuals who have flat feet. PROGNOSIS  If treated properly, then the symptoms of plantar fasciitis usually resolve without surgery. However, occasionally surgery is necessary. RELATED COMPLICATIONS   Recurrent symptoms that may result in a chronic condition.  Problems of the lower back that are caused by compensating for the injury, such as limping.  Pain or weakness of the foot during push-off following surgery.  Chronic inflammation, scarring, and partial or complete fascia tear, occurring more often from repeated injections. TREATMENT  Treatment initially involves the use of ice and medication to help reduce pain and inflammation. The use  of strengthening and stretching exercises may help reduce pain with activity, especially stretches of the Achilles tendon. These exercises may be performed at home or with a therapist. Your caregiver may recommend that you use heel cups of arch supports to help reduce stress on the plantar fascia. Occasionally, corticosteroid injections are given to reduce inflammation. If symptoms persist for greater than 6 months despite non-surgical (conservative), then surgery may be recommended.  MEDICATION   If pain medication is necessary, then nonsteroidal anti-inflammatory medications, such as aspirin and ibuprofen, or other minor pain relievers, such as acetaminophen, are often recommended.  Do not take pain medication within 7 days before surgery.  Prescription pain relievers may be given if deemed necessary by your caregiver. Use only as directed and only as much as you need.  Corticosteroid injections may be given by your caregiver. These injections should be reserved for the most serious cases, because they may only be given a certain number of times. HEAT AND COLD  Cold treatment (icing) relieves pain and reduces inflammation. Cold treatment should be applied for 10 to 15 minutes every 2 to 3 hours for inflammation and pain and immediately after any activity that aggravates your symptoms. Use ice packs or massage the area with a piece of ice (ice massage).  Heat treatment may be used prior to performing the stretching and strengthening activities  prescribed by your caregiver, physical therapist, or athletic trainer. Use a heat pack or soak the injury in warm water. SEEK IMMEDIATE MEDICAL CARE IF:  Treatment seems to offer no benefit, or the condition worsens.  Any medications produce adverse side effects. EXERCISES RANGE OF MOTION (ROM) AND STRETCHING EXERCISES - Plantar Fasciitis (Heel Spur Syndrome) These exercises may help you when beginning to rehabilitate your injury. Your symptoms may  resolve with or without further involvement from your physician, physical therapist or athletic trainer. While completing these exercises, remember:   Restoring tissue flexibility helps normal motion to return to the joints. This allows healthier, less painful movement and activity.  An effective stretch should be held for at least 30 seconds.  A stretch should never be painful. You should only feel a gentle lengthening or release in the stretched tissue. RANGE OF MOTION - Toe Extension, Flexion  Sit with your right / left leg crossed over your opposite knee.  Grasp your toes and gently pull them back toward the top of your foot. You should feel a stretch on the bottom of your toes and/or foot.  Hold this stretch for __________ seconds.  Now, gently pull your toes toward the bottom of your foot. You should feel a stretch on the top of your toes and or foot.  Hold this stretch for __________ seconds. Repeat __________ times. Complete this stretch __________ times per day.  RANGE OF MOTION - Ankle Dorsiflexion, Active Assisted  Remove shoes and sit on a chair that is preferably not on a carpeted surface.  Place right / left foot under knee. Extend your opposite leg for support.  Keeping your heel down, slide your right / left foot back toward the chair until you feel a stretch at your ankle or calf. If you do not feel a stretch, slide your bottom forward to the edge of the chair, while still keeping your heel down.  Hold this stretch for __________ seconds. Repeat __________ times. Complete this stretch __________ times per day.  STRETCH - Gastroc, Standing  Place hands on wall.  Extend right / left leg, keeping the front knee somewhat bent.  Slightly point your toes inward on your back foot.  Keeping your right / left heel on the floor and your knee straight, shift your weight toward the wall, not allowing your back to arch.  You should feel a gentle stretch in the right / left  calf. Hold this position for __________ seconds. Repeat __________ times. Complete this stretch __________ times per day. STRETCH - Soleus, Standing  Place hands on wall.  Extend right / left leg, keeping the other knee somewhat bent.  Slightly point your toes inward on your back foot.  Keep your right / left heel on the floor, bend your back knee, and slightly shift your weight over the back leg so that you feel a gentle stretch deep in your back calf.  Hold this position for __________ seconds. Repeat __________ times. Complete this stretch __________ times per day. STRETCH - Gastrocsoleus, Standing  Note: This exercise can place a lot of stress on your foot and ankle. Please complete this exercise only if specifically instructed by your caregiver.   Place the ball of your right / left foot on a step, keeping your other foot firmly on the same step.  Hold on to the wall or a rail for balance.  Slowly lift your other foot, allowing your body weight to press your heel down over the edge of  the step.  You should feel a stretch in your right / left calf.  Hold this position for __________ seconds.  Repeat this exercise with a slight bend in your right / left knee. Repeat __________ times. Complete this stretch __________ times per day.  STRENGTHENING EXERCISES - Plantar Fasciitis (Heel Spur Syndrome)  These exercises may help you when beginning to rehabilitate your injury. They may resolve your symptoms with or without further involvement from your physician, physical therapist or athletic trainer. While completing these exercises, remember:   Muscles can gain both the endurance and the strength needed for everyday activities through controlled exercises.  Complete these exercises as instructed by your physician, physical therapist or athletic trainer. Progress the resistance and repetitions only as guided. STRENGTH - Towel Curls  Sit in a chair positioned on a non-carpeted  surface.  Place your foot on a towel, keeping your heel on the floor.  Pull the towel toward your heel by only curling your toes. Keep your heel on the floor.  If instructed by your physician, physical therapist or athletic trainer, add ____________________ at the end of the towel. Repeat __________ times. Complete this exercise __________ times per day. STRENGTH - Ankle Inversion  Secure one end of a rubber exercise band/tubing to a fixed object (table, pole). Loop the other end around your foot just before your toes.  Place your fists between your knees. This will focus your strengthening at your ankle.  Slowly, pull your big toe up and in, making sure the band/tubing is positioned to resist the entire motion.  Hold this position for __________ seconds.  Have your muscles resist the band/tubing as it slowly pulls your foot back to the starting position. Repeat __________ times. Complete this exercises __________ times per day.  Document Released: 06/02/2005 Document Revised: 08/25/2011 Document Reviewed: 09/14/2008 [facility] Patient Information 2015 [place], [place]. This information is not intended to replace advice given to you by your health care provider. Make sure you discuss any questions you have with your health care provider.

## 2015-02-16 DIAGNOSIS — M5136 Other intervertebral disc degeneration, lumbar region: Secondary | ICD-10-CM | POA: Diagnosis not present

## 2015-02-16 DIAGNOSIS — I10 Essential (primary) hypertension: Secondary | ICD-10-CM | POA: Diagnosis not present

## 2015-02-16 DIAGNOSIS — R062 Wheezing: Secondary | ICD-10-CM | POA: Diagnosis not present

## 2015-02-16 DIAGNOSIS — J209 Acute bronchitis, unspecified: Secondary | ICD-10-CM | POA: Diagnosis not present

## 2015-02-16 DIAGNOSIS — E1140 Type 2 diabetes mellitus with diabetic neuropathy, unspecified: Secondary | ICD-10-CM | POA: Diagnosis not present

## 2015-02-16 NOTE — Progress Notes (Signed)
Patient ID: GYSELLE RENSCHLER, female   DOB: Jul 29, 1950, 64 y.o.   MRN: [id number]  Subjective: 64 year old female presents the office they for follow-up evaluation of bilateral foot pain. She said that she is curling taking gabapentin still and she is taking 200 mg 3 times a day. She said it is not helping the ligament helps is tizanidine. She states that she does get sharp and burning pains the bottom of her feet. Just has pain in the bottom of her heel which is worse in the morning or after periods of rest which is relieved by ambulation. Denies any recent injury or trauma. No swelling or redness. She's had no prior treatment for this. No other complaints at this time. No acute changes otherwise.  Objective: AAO 3, NAD DP/PT pulses palpable, CRT less than 3 seconds  Protective sensation decreased with Sims once the monofilament There is mild tailors palpation along the plantar medial tubercle of the calcaneus at the insertion the plantar fascia bilaterally. There is no pain on the course of the plantar fascial in the arch of the foot. There is no pain with lateral compression of the calcaneus. No pain on the Achilles tendon. No other areas of pinpoint bony tenderness or pain the vibratory sensation. There is no overlying edema, erythema, increase in warmth. MMT 5/5, ROM WNL.  No open lesions or pre-ulcerative lesions. No pain with calf compression, swelling, warmth, erythema.  Assessment: 64 year old female with neuropathy, plantar fasciitis  Plan: -Treatment options discussed including all alternatives, risks, and complications -I discussed treatment options for neuropathy. I and her to increase her gabapentin 300 mg 3 times a day. Prescription for this was given. Monitor for side effects and directed to call any are to occur. -I discussed steroid injection for plantar fasciitis have her she wishes to hold off. Plantar fascial taking was applied. Discussed supportive shoe gear and orthotics.   -Follow-up as scheduled or sooner if any problems arise. In the meantime, encouraged to call the office with any questions, concerns, change in symptoms.   Celesta Gentile, DPM

## 2015-02-23 DIAGNOSIS — M5136 Other intervertebral disc degeneration, lumbar region: Secondary | ICD-10-CM | POA: Diagnosis not present

## 2015-02-23 DIAGNOSIS — E1140 Type 2 diabetes mellitus with diabetic neuropathy, unspecified: Secondary | ICD-10-CM | POA: Diagnosis not present

## 2015-02-23 DIAGNOSIS — I10 Essential (primary) hypertension: Secondary | ICD-10-CM | POA: Diagnosis not present

## 2015-02-23 DIAGNOSIS — M542 Cervicalgia: Secondary | ICD-10-CM | POA: Diagnosis not present

## 2015-03-09 ENCOUNTER — Encounter: Payer: Self-pay | Admitting: Podiatry

## 2015-03-09 ENCOUNTER — Ambulatory Visit (INDEPENDENT_AMBULATORY_CARE_PROVIDER_SITE_OTHER): Payer: Commercial Managed Care - HMO | Admitting: Podiatry

## 2015-03-09 VITALS — BP 113/92 | HR 73 | Resp 18

## 2015-03-09 DIAGNOSIS — E1140 Type 2 diabetes mellitus with diabetic neuropathy, unspecified: Secondary | ICD-10-CM

## 2015-03-09 DIAGNOSIS — E1149 Type 2 diabetes mellitus with other diabetic neurological complication: Secondary | ICD-10-CM

## 2015-03-09 DIAGNOSIS — M722 Plantar fascial fibromatosis: Secondary | ICD-10-CM

## 2015-03-09 MED ORDER — TRIAMCINOLONE ACETONIDE 10 MG/ML IJ SUSP
10.0000 mg | Freq: Once | INTRAMUSCULAR | Status: AC
  Administered 2015-03-09: 10 mg

## 2015-03-09 NOTE — Patient Instructions (Signed)
Plantar Fasciitis (Heel Spur Syndrome) with Rehab The plantar fascia is a fibrous, ligament-like, soft-tissue structure that spans the bottom of the foot. Plantar fasciitis is a condition that causes pain in the foot due to inflammation of the tissue. SYMPTOMS   Pain and tenderness on the underneath side of the foot.  Pain that worsens with standing or walking. CAUSES  Plantar fasciitis is caused by irritation and injury to the plantar fascia on the underneath side of the foot. Common mechanisms of injury include:  Direct trauma to bottom of the foot.  Damage to a small nerve that runs under the foot where the main fascia attaches to the heel bone.  Stress placed on the plantar fascia due to bone spurs. RISK INCREASES WITH:   Activities that place stress on the plantar fascia (running, jumping, pivoting, or cutting).  Poor strength and flexibility.  Improperly fitted shoes.  Tight calf muscles.  Flat feet.  Failure to warm-up properly before activity.  Obesity. PREVENTION  Warm up and stretch properly before activity.  Allow for adequate recovery between workouts.  Maintain physical fitness:  Strength, flexibility, and endurance.  Cardiovascular fitness.  Maintain a health body weight.  Avoid stress on the plantar fascia.  Wear properly fitted shoes, including arch supports for individuals who have flat feet. PROGNOSIS  If treated properly, then the symptoms of plantar fasciitis usually resolve without surgery. However, occasionally surgery is necessary. RELATED COMPLICATIONS   Recurrent symptoms that may result in a chronic condition.  Problems of the lower back that are caused by compensating for the injury, such as limping.  Pain or weakness of the foot during push-off following surgery.  Chronic inflammation, scarring, and partial or complete fascia tear, occurring more often from repeated injections. TREATMENT  Treatment initially involves the use of  ice and medication to help reduce pain and inflammation. The use of strengthening and stretching exercises may help reduce pain with activity, especially stretches of the Achilles tendon. These exercises may be performed at home or with a therapist. Your caregiver may recommend that you use heel cups of arch supports to help reduce stress on the plantar fascia. Occasionally, corticosteroid injections are given to reduce inflammation. If symptoms persist for greater than 6 months despite non-surgical (conservative), then surgery may be recommended.  MEDICATION   If pain medication is necessary, then nonsteroidal anti-inflammatory medications, such as aspirin and ibuprofen, or other minor pain relievers, such as acetaminophen, are often recommended.  Do not take pain medication within 7 days before surgery.  Prescription pain relievers may be given if deemed necessary by your caregiver. Use only as directed and only as much as you need.  Corticosteroid injections may be given by your caregiver. These injections should be reserved for the most serious cases, because they may only be given a certain number of times. HEAT AND COLD  Cold treatment (icing) relieves pain and reduces inflammation. Cold treatment should be applied for 10 to 15 minutes every 2 to 3 hours for inflammation and pain and immediately after any activity that aggravates your symptoms. Use ice packs or massage the area with a piece of ice (ice massage).  Heat treatment may be used prior to performing the stretching and strengthening activities prescribed by your caregiver, physical therapist, or athletic trainer. Use a heat pack or soak the injury in warm water. SEEK IMMEDIATE MEDICAL CARE IF:  Treatment seems to offer no benefit, or the condition worsens.  Any medications produce adverse side effects. EXERCISES RANGE   OF MOTION (ROM) AND STRETCHING EXERCISES - Plantar Fasciitis (Heel Spur Syndrome) These exercises may help you  when beginning to rehabilitate your injury. Your symptoms may resolve with or without further involvement from your physician, physical therapist or athletic trainer. While completing these exercises, remember:   Restoring tissue flexibility helps normal motion to return to the joints. This allows healthier, less painful movement and activity.  An effective stretch should be held for at least 30 seconds.  A stretch should never be painful. You should only feel a gentle lengthening or release in the stretched tissue. RANGE OF MOTION - Toe Extension, Flexion  Sit with your right / left leg crossed over your opposite knee.  Grasp your toes and gently pull them back toward the top of your foot. You should feel a stretch on the bottom of your toes and/or foot.  Hold this stretch for __________ seconds.  Now, gently pull your toes toward the bottom of your foot. You should feel a stretch on the top of your toes and or foot.  Hold this stretch for __________ seconds. Repeat __________ times. Complete this stretch __________ times per day.  RANGE OF MOTION - Ankle Dorsiflexion, Active Assisted  Remove shoes and sit on a chair that is preferably not on a carpeted surface.  Place right / left foot under knee. Extend your opposite leg for support.  Keeping your heel down, slide your right / left foot back toward the chair until you feel a stretch at your ankle or calf. If you do not feel a stretch, slide your bottom forward to the edge of the chair, while still keeping your heel down.  Hold this stretch for __________ seconds. Repeat __________ times. Complete this stretch __________ times per day.  STRETCH - Gastroc, Standing  Place hands on wall.  Extend right / left leg, keeping the front knee somewhat bent.  Slightly point your toes inward on your back foot.  Keeping your right / left heel on the floor and your knee straight, shift your weight toward the wall, not allowing your back to  arch.  You should feel a gentle stretch in the right / left calf. Hold this position for __________ seconds. Repeat __________ times. Complete this stretch __________ times per day. STRETCH - Soleus, Standing  Place hands on wall.  Extend right / left leg, keeping the other knee somewhat bent.  Slightly point your toes inward on your back foot.  Keep your right / left heel on the floor, bend your back knee, and slightly shift your weight over the back leg so that you feel a gentle stretch deep in your back calf.  Hold this position for __________ seconds. Repeat __________ times. Complete this stretch __________ times per day. STRETCH - Gastrocsoleus, Standing  Note: This exercise can place a lot of stress on your foot and ankle. Please complete this exercise only if specifically instructed by your caregiver.   Place the ball of your right / left foot on a step, keeping your other foot firmly on the same step.  Hold on to the wall or a rail for balance.  Slowly lift your other foot, allowing your body weight to press your heel down over the edge of the step.  You should feel a stretch in your right / left calf.  Hold this position for __________ seconds.  Repeat this exercise with a slight bend in your right / left knee. Repeat __________ times. Complete this stretch __________ times per day.    STRENGTHENING EXERCISES - Plantar Fasciitis (Heel Spur Syndrome)  These exercises may help you when beginning to rehabilitate your injury. They may resolve your symptoms with or without further involvement from your physician, physical therapist or athletic trainer. While completing these exercises, remember:   Muscles can gain both the endurance and the strength needed for everyday activities through controlled exercises.  Complete these exercises as instructed by your physician, physical therapist or athletic trainer. Progress the resistance and repetitions only as guided. STRENGTH -  Towel Curls  Sit in a chair positioned on a non-carpeted surface.  Place your foot on a towel, keeping your heel on the floor.  Pull the towel toward your heel by only curling your toes. Keep your heel on the floor.  If instructed by your physician, physical therapist or athletic trainer, add ____________________ at the end of the towel. Repeat __________ times. Complete this exercise __________ times per day. STRENGTH - Ankle Inversion  Secure one end of a rubber exercise band/tubing to a fixed object (table, pole). Loop the other end around your foot just before your toes.  Place your fists between your knees. This will focus your strengthening at your ankle.  Slowly, pull your big toe up and in, making sure the band/tubing is positioned to resist the entire motion.  Hold this position for __________ seconds.  Have your muscles resist the band/tubing as it slowly pulls your foot back to the starting position. Repeat __________ times. Complete this exercises __________ times per day.  Document Released: 06/02/2005 Document Revised: 08/25/2011 Document Reviewed: 09/14/2008 [employer] Patient Information 2015 [employer]. This information is not intended to replace advice given to you by your health care provider. Make sure you discuss any questions you have with your health care provider.  

## 2015-03-12 NOTE — Progress Notes (Signed)
Patient ID: [name], female   DOB: Nov 30, 1950, 64 y.o.   MRN: [id number]  Subjective: 64 year old female presents the office they for follow-up evaluation of neuropathy as well as her heel pain. She states that she is currently taking 300 mg 3 times a day of gabapentin. She continues have numbness and burning to the bottom of her feet. She also states that she continues to get pain during heels mostly in the morning or after periods of rest. It gets some better with ambulation although becomes more of a throbbing pain throughout the day. She feels that the numbness in the heel pain are different types of pain. No recent injury or trauma. The pain does not wake her up at night. She is requesting a steroid injection into her heels. Denies any claudication symptoms. No other complaints at this time.  Objective: AAO x3, NAD DP/PT pulses palpable bilaterally, CRT less than 3 seconds Protective sensation decreased with Simms Weinstein monofilament; negative tinel sign Tenderness to palpation overlying the plantar medial tubercle of the calcaneus to bilateral heels at the insertion of the plantar fascia with the left > right. There is no pain along the course of plantar fascial within the arch of the foot. There is no pain with lateral compression of the calcaneus or pain the vibratory sensation. No pain on the posterior aspect of the calcaneus or along the course/insertion of the Achilles tendon. There is no overlying edema, erythema, increase in warmth. No other areas of tenderness palpation or pain with vibratory sensation to the foot/ankle. MMT 5/5, ROM WNL No open lesions or pre-ulcerative lesions are identified. No pain with calf compression, swelling, warmth, erythema.  Assessment: 64 year old female with likely neuropathy with heel pain/plantar fasciitis  Plan: -Treatment options discussed including all alternatives, risks, and complications -For the neuropathy she can increase to 600 mg  at nighttime and continue 300 mg in the morning and at lunch. We can continue to titrate up as she can tolerate the medicine. Patient elects to proceed with steroid injection into bilateral heels. Under sterile skin preparation, a total of 2.5cc of kenalog 10, 0.5% Marcaine plain, and 2% lidocaine plain were infiltrated into the symptomatic area without complication. A band-aid was applied. Patient tolerated the injection well without complication. Post-injection care with discussed with the patient. Discussed with the patient to ice the area over the next couple of days to help prevent a steroid flare. Continue stretching and icing activities daily. Plantar fascial brace dispensed.  -Follow-up in 4 weeks or sooner if any problems arise. In the meantime, encouraged to call the office with any questions, concerns, change in symptoms.   Celesta Gentile, DPM

## 2015-03-20 DIAGNOSIS — M1712 Unilateral primary osteoarthritis, left knee: Secondary | ICD-10-CM | POA: Diagnosis not present

## 2015-03-23 ENCOUNTER — Telehealth: Payer: Self-pay | Admitting: *Deleted

## 2015-03-23 DIAGNOSIS — M722 Plantar fascial fibromatosis: Secondary | ICD-10-CM

## 2015-03-23 NOTE — Telephone Encounter (Signed)
Can we give her a prescription for physical therapy? Thanks.

## 2015-03-23 NOTE — Telephone Encounter (Addendum)
Pt states the 2 injections given have not helped, what else to do?  I spoke with pt she says she is icing, but not regularly, but would like something else to help with the pain and "inflamedness".  I told pt to regular up on the icing and I would call with further instructions from Dr. Jacqualyn Posey.  Pt also wanted to start the process for diabetic shoes.  Informed pt that Dr. Jacqualyn Posey referred to [facility], she choose [address].  Faxed orders to [facility] on [address].

## 2015-03-30 DIAGNOSIS — Z23 Encounter for immunization: Secondary | ICD-10-CM | POA: Diagnosis not present

## 2015-03-30 DIAGNOSIS — Z1239 Encounter for other screening for malignant neoplasm of breast: Secondary | ICD-10-CM | POA: Diagnosis not present

## 2015-03-30 DIAGNOSIS — E1140 Type 2 diabetes mellitus with diabetic neuropathy, unspecified: Secondary | ICD-10-CM | POA: Diagnosis not present

## 2015-03-30 DIAGNOSIS — E0842 Diabetes mellitus due to underlying condition with diabetic polyneuropathy: Secondary | ICD-10-CM | POA: Diagnosis not present

## 2015-03-30 DIAGNOSIS — I10 Essential (primary) hypertension: Secondary | ICD-10-CM | POA: Diagnosis not present

## 2015-03-30 DIAGNOSIS — J302 Other seasonal allergic rhinitis: Secondary | ICD-10-CM | POA: Diagnosis not present

## 2015-04-03 ENCOUNTER — Other Ambulatory Visit: Payer: Self-pay

## 2015-04-03 DIAGNOSIS — Z1231 Encounter for screening mammogram for malignant neoplasm of breast: Secondary | ICD-10-CM

## 2015-04-06 ENCOUNTER — Ambulatory Visit: Payer: Commercial Managed Care - HMO | Admitting: Podiatry

## 2015-04-09 ENCOUNTER — Ambulatory Visit: Payer: Medicare Other | Admitting: Physical Therapy

## 2015-04-11 DIAGNOSIS — E119 Type 2 diabetes mellitus without complications: Secondary | ICD-10-CM | POA: Diagnosis not present

## 2015-04-11 DIAGNOSIS — H2511 Age-related nuclear cataract, right eye: Secondary | ICD-10-CM | POA: Diagnosis not present

## 2015-04-11 DIAGNOSIS — H2512 Age-related nuclear cataract, left eye: Secondary | ICD-10-CM | POA: Diagnosis not present

## 2015-04-23 ENCOUNTER — Encounter: Payer: Self-pay | Admitting: *Deleted

## 2015-04-23 ENCOUNTER — Encounter: Payer: Self-pay | Admitting: Podiatry

## 2015-04-23 ENCOUNTER — Ambulatory Visit (INDEPENDENT_AMBULATORY_CARE_PROVIDER_SITE_OTHER): Payer: Commercial Managed Care - HMO | Admitting: Podiatry

## 2015-04-23 ENCOUNTER — Telehealth: Payer: Self-pay | Admitting: *Deleted

## 2015-04-23 VITALS — BP 122/62 | HR 78 | Resp 18

## 2015-04-23 DIAGNOSIS — M722 Plantar fascial fibromatosis: Secondary | ICD-10-CM | POA: Diagnosis not present

## 2015-04-23 DIAGNOSIS — E1149 Type 2 diabetes mellitus with other diabetic neurological complication: Secondary | ICD-10-CM | POA: Diagnosis not present

## 2015-04-23 NOTE — Telephone Encounter (Addendum)
Dr. Jacqualyn Posey ordered Neuropath Musculosketetal Formula containing Amantadine 3%, Carbamazapine 3%, Gabapentin 6%, Meloxicam 0.5%, Pentoxifyline 3% dispense 180grams apply 1-2 grams to affected area 3-4 times, +2 refills.  Faxed.  Pt states she still hasn't received her pain cream.  I told pt it would becoming from the same pharmacy as before so allow for some time.

## 2015-04-23 NOTE — Progress Notes (Signed)
Patient ID: [name], female   DOB: March 01, 1951, 64 y.o.   MRN: [id number]  Subjective: 64 year old female presents the occipital evaluation of bilateral heel pain as well as neuropathy. She states her primary care physician is change her to Lyrica. She states that since switching to Lyrica she has had some significantly improved symptoms although she does continue have numbness as well as burning pain to her feet. She was at the braces do help her heel pain although she had no relief after the injections. She has been continuing stretching icing exercises daily as well. She is still awaiting approval for diabetic shoes. No other complaints at this time. No acute changes.  Objective: AAO 3, NAD DP/PT pulses palpable 2/4, CRT less than 3 seconds Protective sensation decreased with Simms Weinstein monofilament, negative Tinel There is mild continue tenderness on the plantar medial tubercle of the calcaneus or the insertion of the plantar fascial bilaterally. Subjectively there is also some discomfort on the medial band of plantar fascia upon palpation although there is no pain on this time. There is no other areas of tenderness to bilateral lower chemise. There is no edema, erythema, increase in warmth. There is no open lesions or pre-ulcerative lesions. There is no pain with calf compression, swelling, warmth, erythema. There is a decrease in medial arch upon weightbearing.  Assessment: 64 year old female with likely heel pain his results of plantar fasciitis with overlying neuropathy  Plan: -Treatment options discussed including all alternatives, risks, and complications -Recommend continuing Lyrica. Follow-up with her primary care physician as well. I also ordered a neuropathic compound cream. Discussed possible neurology consultation should symptoms continue. She has had improved symptoms on Lyrica. -I do believe that she would likely benefit from inserts and diabetic shoes. Although she  has not yet been approved we went ahead and measured her today. -Continue with stretching icing exercises daily. -Follow-up in the near future sooner if any problems are to arise. Call any questions or concerns the meantime.  Celesta Gentile, DPM

## 2015-04-27 ENCOUNTER — Encounter (HOSPITAL_COMMUNITY): Payer: Self-pay | Admitting: Emergency Medicine

## 2015-04-27 ENCOUNTER — Emergency Department (INDEPENDENT_AMBULATORY_CARE_PROVIDER_SITE_OTHER): Payer: Commercial Managed Care - HMO

## 2015-04-27 ENCOUNTER — Emergency Department (INDEPENDENT_AMBULATORY_CARE_PROVIDER_SITE_OTHER)
Admission: EM | Admit: 2015-04-27 | Discharge: 2015-04-27 | Disposition: A | Discharge status: Home / Self Care | Payer: Commercial Managed Care - HMO | Source: Home / Self Care | Attending: Family Medicine | Admitting: Family Medicine

## 2015-04-27 DIAGNOSIS — M25562 Pain in left knee: Secondary | ICD-10-CM | POA: Diagnosis not present

## 2015-04-27 DIAGNOSIS — M7989 Other specified soft tissue disorders: Secondary | ICD-10-CM | POA: Diagnosis not present

## 2015-04-27 DIAGNOSIS — M1712 Unilateral primary osteoarthritis, left knee: Secondary | ICD-10-CM

## 2015-04-27 MED ORDER — DICLOFENAC SODIUM 1 % TD GEL: 4.0000 g | Freq: Four times a day (QID) | TRANSDERMAL | Status: DC

## 2015-04-27 NOTE — ED Provider Notes (Signed)
CSN: [id number]     Arrival date & time 04/27/15  1625 History   First MD Initiated Contact with Patient 04/27/15 1655     Chief Complaint  Patient presents with  . Knee Pain   (Consider location/radiation/quality/duration/timing/severity/associated sxs/prior Treatment) HPI Comments: Patient with history of diabetes and arthritis  Patient is a 64 y.o. female presenting with knee pain. The history is provided by the patient.  Knee Pain Location:  Knee Time since incident:  6 months Injury: yes   Mechanism of injury: fall   Fall:    Fall occurred:  [employer] of impact:  Knees   Entrapped after fall: no   Knee location:  L knee Pain details:    Quality:  Throbbing   Radiates to:  L leg   Severity:  Severe   Onset quality:  Gradual   Duration:  6 months   Timing:  Constant   Progression:  Unchanged Foreign body present:  No foreign bodies Prior injury to area: only fall. Relieved by:  Nothing Worsened by:  Activity and bearing weight Ineffective treatments:  None tried Associated symptoms: stiffness and swelling   Associated symptoms: no back pain and no fever   Associated symptoms comment:  "popping and cracking when walking" Risk factors: obesity   Risk factors comment:  History of arthritis   Past Medical History  Diagnosis Date  . Hypertension   . Diabetes mellitus without complication ([place])   . Sleep apnea     recently diagnosed  . Arthritis   . Headache(784.0)   . H/O hiatal hernia   . GERD (gastroesophageal reflux disease)    Past Surgical History  Procedure Laterality Date  . Tonsillectomy    . Abdominal hysterectomy      partial  . Cholecystectomy    . Left shoulder      x 2 surgeries  . Right shoulder      x2  . Back surgery    . Shoulder open rotator cuff repair Right 08/25/2012    Procedure: ROTATOR CUFF REPAIR SHOULDER OPEN WITH ACROMIOPLASTY;  Surgeon: Tobi Bastos, MD;  Location: [facility];  Service: Orthopedics;  Laterality: Right;    Family History  Problem Relation Age of Onset  . Colon cancer Father   . Colon cancer Paternal Grandfather   . Colon cancer Maternal Grandfather   . Bone cancer Maternal Uncle    Social History  Substance Use Topics  . Smoking status: Never Smoker   . Smokeless tobacco: Never Used  . Alcohol Use: No     Comment: rarely   OB History    No data available     Review of Systems  Constitutional: Negative for fever and chills.  HENT: Negative for rhinorrhea and sore throat.   Eyes: Negative for visual disturbance.  Respiratory: Negative for cough, chest tightness and shortness of breath.   Cardiovascular: Negative for chest pain.  Gastrointestinal: Negative for nausea, vomiting, abdominal pain, diarrhea and constipation.  Genitourinary: Negative for dysuria and hematuria.  Musculoskeletal: Positive for joint swelling, arthralgias, gait problem and stiffness. Negative for back pain.  Skin: Negative for color change, rash and wound.  Neurological: Negative for dizziness, syncope, weakness, numbness and headaches.    Allergies  Review of patient's allergies indicates no known allergies.  Home Medications   Prior to Admission medications   Medication Sig Start Date End Date Taking? Authorizing Provider  ACCU-CHEK AVIVA PLUS test strip USE BID FOR GLUCOSE TESTING 12/19/14  Historical Provider, MD  acyclovir (ZOVIRAX) 400 MG tablet TK 1 T PO  BID FOR 10 DAYS PRN 11/25/14   Historical Provider, MD  amLODipine (NORVASC) 5 MG tablet Take 5 mg by mouth daily.    Historical Provider, MD  B-D ULTRAFINE III SHORT PEN 31G X 8 MM MISC  09/27/14   Historical Provider, MD  diclofenac sodium (VOLTAREN) 1 % GEL Apply 4 g topically 4 (four) times daily. Please instruct in dosing. 04/27/15   Billy Fischer, MD  gabapentin (NEURONTIN) 100 MG capsule Take 1 capsule (100 mg total) by mouth at bedtime. 10/25/14   Trula Slade, DPM  gabapentin (NEURONTIN) 300 MG capsule Take 1 capsule (300 mg total)  by mouth 3 (three) times daily. 02/09/15   Trula Slade, DPM  glimepiride (AMARYL) 2 MG tablet Take 4 mg by mouth daily before breakfast.     Historical Provider, MD  glimepiride (AMARYL) 4 MG tablet  12/12/14   Historical Provider, MD  Liraglutide (VICTOZA) 18 MG/3ML SOLN injection Inject 1.8 mg into the skin daily after breakfast.     Historical Provider, MD  lisinopril (PRINIVIL,ZESTRIL) 5 MG tablet Take 5 mg by mouth daily.    Historical Provider, MD  metFORMIN (GLUCOPHAGE) 1000 MG tablet  09/27/14   Historical Provider, MD  metFORMIN (GLUCOPHAGE) 500 MG tablet Take 500 mg by mouth 2 (two) times daily with a meal.    Historical Provider, MD  methocarbamol (ROBAXIN) 500 MG tablet Take 1 tablet (500 mg total) by mouth every 6 (six) hours as needed. Patient taking differently: Take 500 mg by mouth every 6 (six) hours as needed for muscle spasms.  08/26/12   Amber Cecilio Asper, PA-C  methocarbamol (ROBAXIN) 750 MG tablet TK 1 T PO  BID PRN 12/19/14   Historical Provider, MD  NONFORMULARY OR COMPOUNDED ITEM [facility] compound - Neuropathy Musculoskeletal containing Amantadine 3%, Carbamazapine 3%, Gabapentin 6%, Meloxicam 0.5%, Pentoxifyline 3% dispense 180gram +2refills, apply 1-2 gram to affected area 3-4 times daily.    Historical Provider, MD  omeprazole (PRILOSEC) 20 MG capsule  10/05/14   Historical Provider, MD  pregabalin (LYRICA) 150 MG capsule Take 1 capsule (150 mg total) by mouth 2 (two) times daily. 12/29/14   Trula Slade, DPM  pregabalin (LYRICA) 75 MG capsule Take 150mg  (two capsules) in the morning and 150mg  (two capsules) in the evening 12/07/14   Trula Slade, DPM  PREMARIN vaginal cream APPLY 1/2 GRAM VAGINALLY 3 TIMES PER WEEK EVERY NIGHT AT BEDTIME. 01/03/15   Dorothyann Gibbs, NP  tiZANidine (ZANAFLEX) 4 MG tablet  10/17/14   Historical Provider, MD  VICTOZA 18 MG/3ML SOPN  09/27/14   Historical Provider, MD   Meds Ordered and Administered this Visit  Medications - No  data to display  BP 147/99 mmHg  Pulse 96  Temp(Src) 100.1 F (37.8 C) (Oral)  Resp 14  SpO2 97% No data found.   Physical Exam  Constitutional: She is oriented to person, place, and time. She appears well-developed and well-nourished. No distress.  HENT:  Head: Normocephalic and atraumatic.  Neck: Normal range of motion. Neck supple.  Cardiovascular: Normal rate, regular rhythm and normal heart sounds.   Pulmonary/Chest: Effort normal and breath sounds normal.  Musculoskeletal:       Left knee: She exhibits swelling and bony tenderness. She exhibits no ecchymosis, no laceration, no erythema, normal alignment, no LCL laxity and no MCL laxity. Tenderness (throughout whole knee, worse over lateral portion) found.  Lateral joint line tenderness noted.  Lymphadenopathy:    She has no cervical adenopathy.  Neurological: She is alert and oriented to person, place, and time.  Skin: Skin is warm and dry. No rash noted. She is not diaphoretic. No erythema.  Psychiatric: She has a normal mood and affect.    ED Course  Procedures (including critical care time)  Labs Review Labs Reviewed - No data to display  Imaging Review Dg Knee Complete 4 Views Left  04/27/2015  CLINICAL DATA:  Left knee pain and swelling with popping since May 2016. Fall on steps. EXAM: LEFT KNEE - COMPLETE 4+ VIEW COMPARISON:  None. FINDINGS: Mild osteoarthritic change over the medial compartment and patellofemoral joint. No fracture, dislocation or significant joint effusion. IMPRESSION: No acute findings. Electronically Signed   By: Marin Olp M.D.   On: 04/27/2015 17:52   X-rays reviewed and report per radiologist.   Visual Acuity Review  Right Eye Distance:   Left Eye Distance:   Bilateral Distance:    Right Eye Near:   Left Eye Near:    Bilateral Near:         MDM   1. Primary osteoarthritis of left knee        Billy Fischer, MD 04/27/15 1807

## 2015-04-27 NOTE — ED Notes (Signed)
Knee pain and popping since may

## 2015-04-27 NOTE — Discharge Instructions (Signed)
Use knee sleeve, medicine and ice and see orthopedist if further problems.

## 2015-05-03 DIAGNOSIS — G4733 Obstructive sleep apnea (adult) (pediatric): Secondary | ICD-10-CM | POA: Diagnosis not present

## 2015-05-07 DIAGNOSIS — I10 Essential (primary) hypertension: Secondary | ICD-10-CM | POA: Diagnosis not present

## 2015-05-07 DIAGNOSIS — E0842 Diabetes mellitus due to underlying condition with diabetic polyneuropathy: Secondary | ICD-10-CM | POA: Diagnosis not present

## 2015-05-07 DIAGNOSIS — E1140 Type 2 diabetes mellitus with diabetic neuropathy, unspecified: Secondary | ICD-10-CM | POA: Diagnosis not present

## 2015-05-07 DIAGNOSIS — M179 Osteoarthritis of knee, unspecified: Secondary | ICD-10-CM | POA: Diagnosis not present

## 2015-05-07 DIAGNOSIS — R600 Localized edema: Secondary | ICD-10-CM | POA: Diagnosis not present

## 2015-05-09 ENCOUNTER — Ambulatory Visit
Admission: RE | Admit: 2015-05-09 | Discharge: 2015-05-09 | Disposition: A | Discharge status: Home / Self Care | Payer: Commercial Managed Care - HMO | Source: Ambulatory Visit

## 2015-05-09 DIAGNOSIS — Z1231 Encounter for screening mammogram for malignant neoplasm of breast: Secondary | ICD-10-CM | POA: Diagnosis not present

## 2015-05-14 ENCOUNTER — Telehealth: Payer: Self-pay | Admitting: *Deleted

## 2015-05-14 DIAGNOSIS — E1140 Type 2 diabetes mellitus with diabetic neuropathy, unspecified: Secondary | ICD-10-CM | POA: Diagnosis not present

## 2015-05-14 DIAGNOSIS — R7309 Other abnormal glucose: Secondary | ICD-10-CM | POA: Diagnosis not present

## 2015-05-14 MED ORDER — NONFORMULARY OR COMPOUNDED ITEM: Status: DC

## 2015-05-14 NOTE — Telephone Encounter (Addendum)
Pt request refill of the cream prescribed by Dr. Jacqualyn Posey.  Dr. Jacqualyn Posey ordered [place] compound:  (BDGT) Neuropathy Musculoskeletal - Baclofen 2%, Diclofenac 5%, Gabapentin 6%, Tetracaine 3% dispense 180grams apply 1-2 grams to affected area 3-4 times daily, +2refills.

## 2015-05-25 ENCOUNTER — Ambulatory Visit (INDEPENDENT_AMBULATORY_CARE_PROVIDER_SITE_OTHER): Payer: Commercial Managed Care - HMO | Admitting: Podiatry

## 2015-05-25 DIAGNOSIS — E1149 Type 2 diabetes mellitus with other diabetic neurological complication: Secondary | ICD-10-CM | POA: Diagnosis not present

## 2015-05-25 DIAGNOSIS — M2042 Other hammer toe(s) (acquired), left foot: Secondary | ICD-10-CM | POA: Diagnosis not present

## 2015-05-25 DIAGNOSIS — M2041 Other hammer toe(s) (acquired), right foot: Secondary | ICD-10-CM | POA: Diagnosis not present

## 2015-05-25 NOTE — Progress Notes (Signed)
Patient ID: [name], female   DOB: 1950-10-02, 64 y.o.   MRN: [id number] Patient presents for diabetic shoe pick up, shoes are tried on for good fit.  Patient received 1 Pair New Balance [phone] Silver and Nyoka Cowden in women's size 8.5 wide and 3 pairs custom molded diabetic inserts.  Verbal and written break in and wear instructions given.  Patient will follow up for scheduled routine care. She has no other complaints today and has no acute changes since last evaluation. Encouraged to call with questions or concerns.

## 2015-05-25 NOTE — Patient Instructions (Signed)

## 2015-07-03 DIAGNOSIS — E1140 Type 2 diabetes mellitus with diabetic neuropathy, unspecified: Secondary | ICD-10-CM | POA: Diagnosis not present

## 2015-07-03 DIAGNOSIS — M179 Osteoarthritis of knee, unspecified: Secondary | ICD-10-CM | POA: Diagnosis not present

## 2015-07-03 DIAGNOSIS — Z23 Encounter for immunization: Secondary | ICD-10-CM | POA: Diagnosis not present

## 2015-07-03 DIAGNOSIS — I10 Essential (primary) hypertension: Secondary | ICD-10-CM | POA: Diagnosis not present

## 2015-07-03 DIAGNOSIS — Z719 Counseling, unspecified: Secondary | ICD-10-CM | POA: Diagnosis not present

## 2015-07-03 DIAGNOSIS — L97409 Non-pressure chronic ulcer of unspecified heel and midfoot with unspecified severity: Secondary | ICD-10-CM | POA: Diagnosis not present

## 2015-07-07 ENCOUNTER — Emergency Department (INDEPENDENT_AMBULATORY_CARE_PROVIDER_SITE_OTHER)
Admission: EM | Admit: 2015-07-07 | Discharge: 2015-07-07 | Disposition: A | Discharge status: Home / Self Care | Payer: Medicare HMO | Source: Home / Self Care | Attending: Emergency Medicine | Admitting: Emergency Medicine

## 2015-07-07 ENCOUNTER — Encounter (HOSPITAL_COMMUNITY): Payer: Self-pay | Admitting: Emergency Medicine

## 2015-07-07 DIAGNOSIS — S161XXA Strain of muscle, fascia and tendon at neck level, initial encounter: Secondary | ICD-10-CM

## 2015-07-07 DIAGNOSIS — M25562 Pain in left knee: Secondary | ICD-10-CM

## 2015-07-07 DIAGNOSIS — M545 Low back pain, unspecified: Secondary | ICD-10-CM

## 2015-07-07 MED ORDER — PREDNISONE 50 MG PO TABS: ORAL_TABLET | ORAL | Status: DC

## 2015-07-07 MED ORDER — MELOXICAM 7.5 MG PO TABS: 7.5000 mg | ORAL_TABLET | Freq: Every day | ORAL | Status: DC

## 2015-07-07 MED ORDER — CYCLOBENZAPRINE HCL 5 MG PO TABS: 5.0000 mg | ORAL_TABLET | Freq: Three times a day (TID) | ORAL | Status: DC | PRN

## 2015-07-07 NOTE — ED Notes (Signed)
Pt  Reports  Pharmacy     [facility]       Only   Has         1   rx     For  Her   [facility]  Called  They  Have  All  Three    At this  Time
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
## 2015-10-21 ENCOUNTER — Encounter (HOSPITAL_COMMUNITY): Payer: Self-pay | Admitting: Emergency Medicine

## 2015-10-21 ENCOUNTER — Observation Stay (HOSPITAL_COMMUNITY)
Admission: EM | Admit: 2015-10-21 | Discharge: 2015-10-23 | Disposition: A | Discharge status: Home / Self Care | Payer: Medicare HMO | Attending: Internal Medicine | Admitting: Internal Medicine

## 2015-10-21 ENCOUNTER — Emergency Department (HOSPITAL_COMMUNITY): Payer: Medicare HMO

## 2015-10-21 DIAGNOSIS — J209 Acute bronchitis, unspecified: Secondary | ICD-10-CM | POA: Diagnosis not present

## 2015-10-21 DIAGNOSIS — J40 Bronchitis, not specified as acute or chronic: Secondary | ICD-10-CM

## 2015-10-21 DIAGNOSIS — K219 Gastro-esophageal reflux disease without esophagitis: Secondary | ICD-10-CM | POA: Diagnosis present

## 2015-10-21 DIAGNOSIS — I129 Hypertensive chronic kidney disease with stage 1 through stage 4 chronic kidney disease, or unspecified chronic kidney disease: Secondary | ICD-10-CM | POA: Diagnosis not present

## 2015-10-21 DIAGNOSIS — J219 Acute bronchiolitis, unspecified: Secondary | ICD-10-CM | POA: Insufficient documentation

## 2015-10-21 DIAGNOSIS — E1140 Type 2 diabetes mellitus with diabetic neuropathy, unspecified: Secondary | ICD-10-CM | POA: Insufficient documentation

## 2015-10-21 DIAGNOSIS — Z79890 Hormone replacement therapy (postmenopausal): Secondary | ICD-10-CM | POA: Diagnosis not present

## 2015-10-21 DIAGNOSIS — E872 Acidosis, unspecified: Secondary | ICD-10-CM | POA: Diagnosis present

## 2015-10-21 DIAGNOSIS — M129 Arthropathy, unspecified: Secondary | ICD-10-CM

## 2015-10-21 DIAGNOSIS — R0602 Shortness of breath: Secondary | ICD-10-CM | POA: Diagnosis present

## 2015-10-21 DIAGNOSIS — G4733 Obstructive sleep apnea (adult) (pediatric): Secondary | ICD-10-CM | POA: Diagnosis present

## 2015-10-21 DIAGNOSIS — N183 Chronic kidney disease, stage 3 unspecified: Secondary | ICD-10-CM | POA: Diagnosis present

## 2015-10-21 DIAGNOSIS — N179 Acute kidney failure, unspecified: Secondary | ICD-10-CM | POA: Diagnosis not present

## 2015-10-21 DIAGNOSIS — M1990 Unspecified osteoarthritis, unspecified site: Secondary | ICD-10-CM | POA: Diagnosis not present

## 2015-10-21 DIAGNOSIS — Z79899 Other long term (current) drug therapy: Secondary | ICD-10-CM | POA: Diagnosis not present

## 2015-10-21 DIAGNOSIS — Z6833 Body mass index (BMI) 33.0-33.9, adult: Secondary | ICD-10-CM | POA: Insufficient documentation

## 2015-10-21 DIAGNOSIS — E669 Obesity, unspecified: Secondary | ICD-10-CM | POA: Insufficient documentation

## 2015-10-21 DIAGNOSIS — I10 Essential (primary) hypertension: Secondary | ICD-10-CM | POA: Diagnosis not present

## 2015-10-21 DIAGNOSIS — Z7984 Long term (current) use of oral hypoglycemic drugs: Secondary | ICD-10-CM | POA: Insufficient documentation

## 2015-10-21 DIAGNOSIS — E1121 Type 2 diabetes mellitus with diabetic nephropathy: Secondary | ICD-10-CM | POA: Diagnosis present

## 2015-10-21 LAB — URINALYSIS, ROUTINE W REFLEX MICROSCOPIC
Bilirubin Urine: NEGATIVE
Glucose, UA: >1000 mg/dL — AB
Hgb urine dipstick: NEGATIVE
Ketones, ur: 15 mg/dL — AB
Leukocytes, UA: NEGATIVE
Nitrite: NEGATIVE
Protein, ur: NEGATIVE mg/dL
Specific Gravity, Urine: 1.015 (ref 1.005–1.030)
pH: 5.5 (ref 5.0–8.0)

## 2015-10-21 LAB — CBC WITH DIFFERENTIAL/PLATELET
Basophils Absolute: 0.1 K/uL (ref 0.0–0.1)
Basophils Relative: 1 %
Eosinophils Absolute: 0.8 K/uL — ABNORMAL HIGH (ref 0.0–0.7)
Eosinophils Relative: 9 %
HCT: 31.2 % — ABNORMAL LOW (ref 36.0–46.0)
Hemoglobin: 9.8 g/dL — ABNORMAL LOW (ref 12.0–15.0)
Lymphocytes Relative: 32 %
Lymphs Abs: 3 K/uL (ref 0.7–4.0)
MCH: 25.9 pg — ABNORMAL LOW (ref 26.0–34.0)
MCHC: 31.4 g/dL (ref 30.0–36.0)
MCV: 82.3 fL (ref 78.0–100.0)
Monocytes Absolute: 0.9 K/uL (ref 0.1–1.0)
Monocytes Relative: 10 %
Neutro Abs: 4.5 K/uL (ref 1.7–7.7)
Neutrophils Relative %: 48 %
Platelets: 90 K/uL — ABNORMAL LOW (ref 150–400)
RBC: 3.79 MIL/uL — ABNORMAL LOW (ref 3.87–5.11)
RDW: 17.2 % — ABNORMAL HIGH (ref 11.5–15.5)
WBC: 9.3 K/uL (ref 4.0–10.5)

## 2015-10-21 LAB — BASIC METABOLIC PANEL WITH GFR
Anion gap: 13 (ref 5–15)
BUN: 25 mg/dL — ABNORMAL HIGH (ref 6–20)
CO2: 20 mmol/L — ABNORMAL LOW (ref 22–32)
Calcium: 9.4 mg/dL (ref 8.9–10.3)
Chloride: 107 mmol/L (ref 101–111)
Creatinine, Ser: 1.16 mg/dL — ABNORMAL HIGH (ref 0.44–1.00)
GFR calc Af Amer: 57 mL/min — ABNORMAL LOW
GFR calc non Af Amer: 49 mL/min — ABNORMAL LOW
Glucose, Bld: 179 mg/dL — ABNORMAL HIGH (ref 65–99)
Potassium: 3.6 mmol/L (ref 3.5–5.1)
Sodium: 140 mmol/L (ref 135–145)

## 2015-10-21 LAB — URINE MICROSCOPIC-ADD ON
Bacteria, UA: NONE SEEN
Squamous Epithelial / LPF: NONE SEEN

## 2015-10-21 LAB — I-STAT CG4 LACTIC ACID, ED: Lactic Acid, Venous: 5.27 mmol/L (ref 0.5–2.0)

## 2015-10-21 LAB — GLUCOSE, CAPILLARY
Glucose-Capillary: 311 mg/dL — ABNORMAL HIGH (ref 65–99)
Glucose-Capillary: 395 mg/dL — ABNORMAL HIGH (ref 65–99)

## 2015-10-21 LAB — BRAIN NATRIURETIC PEPTIDE: B Natriuretic Peptide: 10.6 pg/mL (ref 0.0–100.0)

## 2015-10-21 LAB — PHOSPHORUS: Phosphorus: 2.6 mg/dL (ref 2.5–4.6)

## 2015-10-21 LAB — TSH: TSH: 0.237 u[IU]/mL — ABNORMAL LOW (ref 0.350–4.500)

## 2015-10-21 LAB — I-STAT TROPONIN, ED: Troponin i, poc: 0 ng/mL (ref 0.00–0.08)

## 2015-10-21 LAB — MAGNESIUM: Magnesium: 2 mg/dL (ref 1.7–2.4)

## 2015-10-21 MED ORDER — ONDANSETRON HCL 4 MG/2ML IJ SOLN: 4.0000 mg | Freq: Four times a day (QID) | INTRAMUSCULAR | Status: DC | PRN

## 2015-10-21 MED ORDER — ALBUTEROL (5 MG/ML) CONTINUOUS INHALATION SOLN
10.0000 mg/h | INHALATION_SOLUTION | Freq: Once | RESPIRATORY_TRACT | Status: AC
  Administered 2015-10-21: 10 mg/h via RESPIRATORY_TRACT
  Filled 2015-10-21: qty 20

## 2015-10-21 MED ORDER — INSULIN DETEMIR 100 UNIT/ML ~~LOC~~ SOLN
10.0000 [IU] | Freq: Every day | SUBCUTANEOUS | Status: DC
  Administered 2015-10-21: 10 [IU] via SUBCUTANEOUS
  Filled 2015-10-21: qty 0.1

## 2015-10-21 MED ORDER — ZOLPIDEM TARTRATE 5 MG PO TABS
5.0000 mg | ORAL_TABLET | Freq: Once | ORAL | Status: AC
  Administered 2015-10-21: 5 mg via ORAL
  Filled 2015-10-21: qty 1

## 2015-10-21 MED ORDER — CYCLOBENZAPRINE HCL 5 MG PO TABS: 5.0000 mg | ORAL_TABLET | Freq: Three times a day (TID) | ORAL | Status: DC | PRN

## 2015-10-21 MED ORDER — HYDROCODONE-ACETAMINOPHEN 5-325 MG PO TABS
1.0000 | ORAL_TABLET | Freq: Four times a day (QID) | ORAL | Status: DC | PRN
  Administered 2015-10-21: 2 via ORAL
  Filled 2015-10-21: qty 2

## 2015-10-21 MED ORDER — INSULIN ASPART 100 UNIT/ML ~~LOC~~ SOLN
0.0000 [IU] | Freq: Three times a day (TID) | SUBCUTANEOUS | Status: DC
  Administered 2015-10-22: 11 [IU] via SUBCUTANEOUS
  Administered 2015-10-22: 15 [IU] via SUBCUTANEOUS
  Administered 2015-10-22 – 2015-10-23 (×3): 11 [IU] via SUBCUTANEOUS

## 2015-10-21 MED ORDER — INSULIN ASPART 100 UNIT/ML ~~LOC~~ SOLN
0.0000 [IU] | Freq: Three times a day (TID) | SUBCUTANEOUS | Status: DC
  Administered 2015-10-21: 11 [IU] via SUBCUTANEOUS

## 2015-10-21 MED ORDER — HEPARIN SODIUM (PORCINE) 5000 UNIT/ML IJ SOLN
5000.0000 [IU] | Freq: Three times a day (TID) | INTRAMUSCULAR | Status: DC
  Administered 2015-10-21 – 2015-10-23 (×5): 5000 [IU] via SUBCUTANEOUS
  Filled 2015-10-21 (×8): qty 1

## 2015-10-21 MED ORDER — PANTOPRAZOLE SODIUM 40 MG PO TBEC
40.0000 mg | DELAYED_RELEASE_TABLET | Freq: Every day | ORAL | Status: DC
  Administered 2015-10-21 – 2015-10-23 (×3): 40 mg via ORAL
  Filled 2015-10-21 (×3): qty 1

## 2015-10-21 MED ORDER — IPRATROPIUM-ALBUTEROL 0.5-2.5 (3) MG/3ML IN SOLN
3.0000 mL | RESPIRATORY_TRACT | Status: DC | PRN
  Administered 2015-10-21 – 2015-10-22 (×2): 3 mL via RESPIRATORY_TRACT
  Filled 2015-10-21 (×2): qty 3

## 2015-10-21 MED ORDER — BUDESONIDE 0.25 MG/2ML IN SUSP
0.2500 mg | Freq: Two times a day (BID) | RESPIRATORY_TRACT | Status: DC
  Administered 2015-10-21 – 2015-10-23 (×4): 0.25 mg via RESPIRATORY_TRACT
  Filled 2015-10-21 (×4): qty 2

## 2015-10-21 MED ORDER — DOXYCYCLINE HYCLATE 100 MG PO TABS
100.0000 mg | ORAL_TABLET | Freq: Two times a day (BID) | ORAL | Status: DC
  Administered 2015-10-21 – 2015-10-23 (×4): 100 mg via ORAL
  Filled 2015-10-21 (×5): qty 1

## 2015-10-21 MED ORDER — GABAPENTIN 100 MG PO CAPS: 100.0000 mg | ORAL_CAPSULE | Freq: Three times a day (TID) | ORAL | Status: DC

## 2015-10-21 MED ORDER — SODIUM CHLORIDE 0.9 % IV SOLN
INTRAVENOUS | Status: DC
  Administered 2015-10-21 – 2015-10-22 (×3): via INTRAVENOUS

## 2015-10-21 MED ORDER — MAGNESIUM SULFATE 2 GM/50ML IV SOLN
2.0000 g | Freq: Once | INTRAVENOUS | Status: AC
  Administered 2015-10-21: 2 g via INTRAVENOUS
  Filled 2015-10-21: qty 50

## 2015-10-21 MED ORDER — METHYLPREDNISOLONE SODIUM SUCC 40 MG IJ SOLR
40.0000 mg | Freq: Two times a day (BID) | INTRAMUSCULAR | Status: DC
  Administered 2015-10-21 – 2015-10-23 (×4): 40 mg via INTRAVENOUS
  Filled 2015-10-21 (×6): qty 1

## 2015-10-21 MED ORDER — ALBUTEROL SULFATE (2.5 MG/3ML) 0.083% IN NEBU
5.0000 mg | INHALATION_SOLUTION | Freq: Once | RESPIRATORY_TRACT | Status: AC
  Administered 2015-10-21: 5 mg via RESPIRATORY_TRACT
  Filled 2015-10-21: qty 6

## 2015-10-21 MED ORDER — GUAIFENESIN-DM 100-10 MG/5ML PO SYRP: 5.0000 mL | ORAL_SOLUTION | ORAL | Status: DC | PRN

## 2015-10-21 MED ORDER — SODIUM CHLORIDE 0.9 % IV BOLUS (SEPSIS)
1000.0000 mL | Freq: Once | INTRAVENOUS | Status: AC
  Administered 2015-10-21: 1000 mL via INTRAVENOUS

## 2015-10-21 MED ORDER — ACETAMINOPHEN 325 MG PO TABS
650.0000 mg | ORAL_TABLET | Freq: Four times a day (QID) | ORAL | Status: DC | PRN
  Administered 2015-10-21: 650 mg via ORAL
  Filled 2015-10-21: qty 2

## 2015-10-21 MED ORDER — ACETAMINOPHEN 650 MG RE SUPP: 650.0000 mg | Freq: Four times a day (QID) | RECTAL | Status: DC | PRN

## 2015-10-21 MED ORDER — INSULIN ASPART 100 UNIT/ML ~~LOC~~ SOLN
0.0000 [IU] | Freq: Every day | SUBCUTANEOUS | Status: DC
  Administered 2015-10-21: 5 [IU] via SUBCUTANEOUS
  Administered 2015-10-22: 4 [IU] via SUBCUTANEOUS

## 2015-10-21 MED ORDER — DEXAMETHASONE SODIUM PHOSPHATE 10 MG/ML IJ SOLN
10.0000 mg | Freq: Once | INTRAMUSCULAR | Status: AC
  Administered 2015-10-21: 10 mg via INTRAVENOUS
  Filled 2015-10-21: qty 1

## 2015-10-21 MED ORDER — ONDANSETRON HCL 4 MG PO TABS: 4.0000 mg | ORAL_TABLET | Freq: Four times a day (QID) | ORAL | Status: DC | PRN

## 2015-10-21 MED ORDER — SIMVASTATIN 10 MG PO TABS
10.0000 mg | ORAL_TABLET | Freq: Every day | ORAL | Status: DC
Start: 2015-10-21 — End: 2015-10-23
  Administered 2015-10-21 – 2015-10-23 (×3): 10 mg via ORAL
  Filled 2015-10-21 (×3): qty 1

## 2015-10-21 MED ORDER — PREGABALIN 75 MG PO CAPS
150.0000 mg | ORAL_CAPSULE | Freq: Two times a day (BID) | ORAL | Status: DC
  Administered 2015-10-21 – 2015-10-23 (×4): 150 mg via ORAL
  Filled 2015-10-21 (×4): qty 2

## 2015-10-21 MED ORDER — AMLODIPINE BESYLATE 5 MG PO TABS
5.0000 mg | ORAL_TABLET | Freq: Every day | ORAL | Status: DC
  Administered 2015-10-21 – 2015-10-23 (×3): 5 mg via ORAL
  Filled 2015-10-21 (×3): qty 1

## 2015-10-21 NOTE — ED Notes (Signed)
PA informed about critical ISTAT lactic.

## 2015-10-21 NOTE — ED Notes (Signed)
Hospitalist at bedside 

## 2015-10-21 NOTE — ED Notes (Signed)
Attempted report to 5E. RN unavailable.

## 2015-10-21 NOTE — H&P (Addendum)
History and Physical    [name] [id number] DOB: [birth year] DOA: 10/21/2015  Referring Provider:  Levering, PA-C  PCP: Philis Fendt, MD   Patient coming from: Home  Chief Complaint: Shortness of breath, wheezing and productive cough  HPI: [name] is a 65 y.o. female with PMH significant for hypertension, diabetes mellitus with nephropathy and neuropathy, chronic renal failure (stage III at baseline), obstructive sleep apnea, chronic arthritis and gastroesophageal reflux disease; who presented to the emergency department secondary to shortness of breath and productive cough. Patient reports that she had been experiencing some wheezing over the last 3 months or so with worsening in her breathing in the last 3-4 days prior to admission. Patient endorses decreased activity tolerance due to her dyspnea and also worsening cough with green expectoration. She denies chest pain, fever, chills, lower extremity swelling, abdominal pain, nausea, vomiting, dysuria, melena, hematochezia, hemoptysis or any other acute complaints.   Of note patient has been started recently on ventolin by her PCP in order to assistance with ongoing wheezing.   ED Course: Patient has a chest x-ray 8 which demonstrated right bronchitic changes and atelectasis patient was no febrile and has normal WBCs. She received nebulizer treatment, magnesium and gentle fluid resuscitation. Given the fact that the patient remains short of breath and with slightly labored breathing, [facility] hospital[facility] has been called to admit the patient for further evaluation and treatment.  The patient is nontoxic and no other feature for sepsis are present on examination.  A lactic acid was ordered and came back to be elevated.   Review of Systems:  All other systems reviewed and apart from HPI, are negative.  Past Medical History  Diagnosis Date  . Hypertension   . Diabetes mellitus without complication ([place])   .  Sleep apnea     recently diagnosed  . Arthritis   . Headache(784.0)   . H/O hiatal hernia   . GERD (gastroesophageal reflux disease)     Past Surgical History  Procedure Laterality Date  . Tonsillectomy    . Abdominal hysterectomy      partial  . Cholecystectomy    . Left shoulder      x 2 surgeries  . Right shoulder      x2  . Back surgery    . Shoulder open rotator cuff repair Right 08/25/2012    Procedure: ROTATOR CUFF REPAIR SHOULDER OPEN WITH ACROMIOPLASTY;  Surgeon: Tobi Bastos, MD;  Location: [facility];  Service: Orthopedics;  Laterality: Right;     reports that she has never smoked. She has never used smokeless tobacco. She reports that she does not drink alcohol or use illicit drugs.  No Known Allergies  Family History  Problem Relation Age of Onset  . Colon cancer Father   . Colon cancer Paternal Grandfather   . Colon cancer Maternal Grandfather   . Bone cancer Maternal Uncle     Prior to Admission medications   Medication Sig Start Date End Date Taking? Authorizing Provider  ACCU-CHEK AVIVA PLUS test strip USE BID FOR GLUCOSE TESTING 12/19/14  Yes Historical Provider, MD  amLODipine (NORVASC) 5 MG tablet Take 5 mg by mouth daily.   Yes Historical Provider, MD  B-D ULTRAFINE III SHORT PEN 31G X 8 MM MISC  09/27/14  Yes Historical Provider, MD  cyclobenzaprine (FLEXERIL) 5 MG tablet Take 1 tablet (5 mg total) by mouth 3 (three) times daily as needed for muscle spasms. 07/07/15  Yes Erick Colace  Bridgett Larsson, MD  diclofenac sodium (VOLTAREN) 1 % GEL Apply 4 g topically 4 (four) times daily. Please instruct in dosing. 04/27/15  Yes Billy Fischer, MD  furosemide (LASIX) 20 MG tablet Take 1 tablet by mouth daily. 09/13/15  Yes Historical Provider, MD  glimepiride (AMARYL) 2 MG tablet Take 4 mg by mouth daily before breakfast.    Yes Historical Provider, MD  Liraglutide (VICTOZA) 18 MG/3ML SOLN injection Inject 1.8 mg into the skin daily after breakfast.    Yes Historical Provider, MD    lisinopril (PRINIVIL,ZESTRIL) 5 MG tablet Take 5 mg by mouth daily.   Yes Historical Provider, MD  metFORMIN (GLUCOPHAGE) 1000 MG tablet 1,000 mg 2 (two) times daily with a meal.  09/27/14  Yes Historical Provider, MD  metFORMIN (GLUCOPHAGE) 500 MG tablet Take 500 mg by mouth 2 (two) times daily with a meal.   Yes Historical Provider, MD  methocarbamol (ROBAXIN) 500 MG tablet Take 1 tablet (500 mg total) by mouth every 6 (six) hours as needed. Patient taking differently: Take 500 mg by mouth every 6 (six) hours as needed for muscle spasms.  08/26/12  Yes Amber Cecilio Asper, PA-C  omeprazole (PRILOSEC) 20 MG capsule  10/05/14  Yes Historical Provider, MD  potassium chloride (K-DUR) 10 MEQ tablet Take 1 tablet by mouth daily. 09/12/15  Yes Historical Provider, MD  pregabalin (LYRICA) 150 MG capsule Take 1 capsule (150 mg total) by mouth 2 (two) times daily. Patient taking differently: Take 150 mg by mouth 3 (three) times daily.  12/29/14  Yes Trula Slade, DPM  pregabalin (LYRICA) 75 MG capsule Take 150mg  (two capsules) in the morning and 150mg  (two capsules) in the evening Patient taking differently: Take 150 mg by mouth 3 (three) times daily. Take 150mg  (two capsules) in the morning and 150mg  (two capsules) in the evening 12/07/14  Yes Trula Slade, DPM  simvastatin (ZOCOR) 10 MG tablet Take 1 tablet by mouth daily. 09/19/15  Yes Historical Provider, MD  tiZANidine (ZANAFLEX) 4 MG tablet  10/17/14  Yes Historical Provider, MD  VENTOLIN HFA 108 (90 Base) MCG/ACT inhaler Inhale 2 puffs into the lungs 3 (three) times daily. 10/01/15  Yes Historical Provider, MD  gabapentin (NEURONTIN) 100 MG capsule Take 1 capsule (100 mg total) by mouth at bedtime. 10/25/14   Trula Slade, DPM  PREMARIN vaginal cream APPLY 1/2 GRAM VAGINALLY 3 TIMES PER WEEK EVERY NIGHT AT BEDTIME. 01/03/15   Dorothyann Gibbs, NP    Physical Exam: Filed Vitals:   10/21/15 1430 10/21/15 1500 10/21/15 1515 10/21/15 1554  BP: 142/65  142/69 144/69 150/77  Pulse: 109   110  Temp:    100.3 F (37.9 C)  TempSrc:    Oral  Resp: 17   18  Height:    5\' 4"  (1.626 m)  Weight:    88.1 kg (194 lb 3.6 oz)  SpO2: 93%   94%      Constitutional: In mild distress secondary to shortness of breath. Patient was tachypneic and unable to to speak in full sentences. No fever, no chest pain, no nausea or vomiting. Eyes: PERRLA, lids and conjunctivae normal; no icterus, no nystagmus ENMT: Mucous membranes are moist. Posterior pharynx clear of any exudate or lesions. Normal dentition. No thrush Neck: normal, supple, no masses, no thyromegaly, No JVD Respiratory: Diffuse wheezing, scattered rhonchi, fair air movement. Patient not using respiratory muscles during examination but was tachypneic and experiencing difficulty talking in full sentences. Cardiovascular: S1 & S2  heard, regular rate and rhythm, no murmurs / rubs / gallops. No extremity edema. No carotid bruits.  Abdomen: No distension, no tenderness, no masses palpated. No hepatosplenomegaly. Bowel sounds normal.  Musculoskeletal: no clubbing / cyanosis. Normal muscle tone. Patient with chronic range of motion in her joints especially bilateral knees, secondary to chronic arthritis. Skin: no rashes, lesions, ulcers. No induration Neurologic: CN 2-12 grossly intact. Sensation intact, Strength 5/5 in all 4 limbs.No focal deficit  Psychiatric: Normal judgment and insight. Alert and oriented x 3. Normal mood.   Labs on Admission: I have personally reviewed following labs and imaging studies  CBC:  Recent Labs Lab 10/21/15 1140  WBC 9.3  NEUTROABS 4.5  HGB 9.8*  HCT 31.2*  MCV 82.3  PLT 90*   Basic Metabolic Panel:  Recent Labs Lab 10/21/15 1140  NA 140  K 3.6  CL 107  CO2 20*  GLUCOSE 179*  BUN 25*  CREATININE 1.16*  CALCIUM 9.4   GFR Estimated Creatinine Clearance: 54.1 mL/min (by C-G formula based on Cr of 1.16).  Urine analysis:    Component Value Date/Time    COLORURINE YELLOW 08/18/2012 1110   APPEARANCEUR CLEAR 08/18/2012 1110   LABSPEC 1.015 08/18/2012 1110   PHURINE 6.5 08/18/2012 1110   GLUCOSEU NEGATIVE 08/18/2012 1110   HGBUR NEGATIVE 08/18/2012 1110   BILIRUBINUR NEGATIVE 08/18/2012 1110   KETONESUR NEGATIVE 08/18/2012 1110   PROTEINUR NEGATIVE 08/18/2012 1110   UROBILINOGEN 0.2 08/18/2012 1110   NITRITE NEGATIVE 08/18/2012 1110   LEUKOCYTESUR NEGATIVE 08/18/2012 1110    Radiological Exams on Admission: Dg Chest 2 View  10/21/2015  CLINICAL DATA:  Shortness of breath and wheezing, hypertension, diabetes mellitus EXAM: CHEST  2 VIEW COMPARISON:  08/18/2012 FINDINGS: Borderline enlargement of cardiac silhouette. Mediastinal contours and pulmonary vascularity normal. Chronic bronchitic changes and mild LEFT basilar scarring. Minimal bibasilar atelectasis. No acute infiltrate, pleural effusion or pneumothorax. Osseous structures unremarkable. IMPRESSION: RIGHT bronchitic changes and LEFT basilar scarring with minimal bibasilar atelectasis. Electronically Signed   By: Lavonia Dana M.D.   On: 10/21/2015 11:55    EKG: No acute ischemic changes appreciated on exam. Sinus rhythm and normal axis.  Assessment/Plan 1-Acute bronchitis and bronchiolitis/SOB: -Patient will be admitted secondary to increase shortness of breath and labored breathing. There is have been also mild desaturation with exertion -Will initiate treatment with doxycycline, Solu-Medrol, Pulmicort and PRN DuoNeb -Will use Robitussin to help with cough/expectoration and also flutter valve -Will follow clinical response and discharged home once medically stable -PRN oxygen supplementation will be provided -Patient without prior history of COPD or asthma; but reports wheezing has been present for multiple months now; concerns for adult asthma component.  2-OSA (obstructive sleep apnea): -Will continue CPAP at bedtime  3-Lactic acidosis: -Most likely associated with ongoing  respiratory issue alone with continue use of metformin, in a patient that is mildly dehydrated and with acute on chronic renal failure. -Will provide fluid resuscitation -Metformin has been discontinue -Will improve resp status -follow lactic acid level  4-Benign essential HTN: -Overall stable. -Will continue the use of amlodipine -Lasix and lisinopril has been discontinue currently secondary to acute on chronic renal failure.  5-GERD (gastroesophageal reflux disease): -Continue PPI  6-Diabetes mellitus with nephropathy and neuropathy  ([place]): -Will discontinue oral hypoglycemic agents while inpatient; especially metformin with ongoing lactic acidosis and acute on chronic renal failure. -Will use a sliding scale insulin and also Levemir daily at bedtime -Will check hemoglobin A1c -Continue Lyrica and Neurontin  7-Acute renal failure superimposed on stage 3 chronic kidney disease ([place]): -Most likely associated to mild dehydration and continue use of nephrotoxic agents (including lisinopril, Lasix, NSAIDs, ETC...) -Nephrotoxic agent has been discontinue currently -Will provide fluid resuscitation -Will check daily analyses -Will follow renal function.  8-Chronic arthritis: -We'll discontinue the use of NSAIDs for now (due to acute on chronic renal failure) -We'll continue the use of as needed also relaxants, Tylenol and as needed vicodin   DVT prophylaxis: Heparin Code Status: Full code Family Communication: Daughter at bedside Disposition Plan: Anticipate discharge back home when medically stable  Consults called: None Admission status: Observation, MedSurg, LOS < 2 midnights    Barton Dubois MD [facility] Pager [phone]  If 7PM-7AM, please contact night-coverage [email or web address] Password [email or web address]  10/21/2015, 4:20 PM

## 2015-10-21 NOTE — ED Provider Notes (Signed)
CSN: [id number]     Arrival date & time 10/21/15  1026 History   First MD Initiated Contact with Patient 10/21/15 1104     Chief Complaint  Patient presents with  . Shortness of Breath  . Cough     (Consider location/radiation/quality/duration/timing/severity/associated sxs/prior Treatment) HPI   [name] is a 65 year old female with a past medical history of DM, HTN, HLD who presents the ED complaining of cough and shortness of breath. Patient states that 3 days ago she developed a productive cough with green sputum. She subsequently developed severe wheezing and difficulty breathing. She states now her chest hurts when she coughs. She has tried taking home nebulizer treatments and albuterol inhaler without relief of her symptoms. Patient also states that she has taken [facility] powders for her right knee arthritis but this also causes her to wheeze and feels that it may have made her symptoms worse. She denies dizziness, syncope, lower extremity swelling, fevers, chills.   Past Medical History  Diagnosis Date  . Hypertension   . Diabetes mellitus without complication ([place])   . Sleep apnea     recently diagnosed  . Arthritis   . Headache(784.0)   . H/O hiatal hernia   . GERD (gastroesophageal reflux disease)    Past Surgical History  Procedure Laterality Date  . Tonsillectomy    . Abdominal hysterectomy      partial  . Cholecystectomy    . Left shoulder      x 2 surgeries  . Right shoulder      x2  . Back surgery    . Shoulder open rotator cuff repair Right 08/25/2012    Procedure: ROTATOR CUFF REPAIR SHOULDER OPEN WITH ACROMIOPLASTY;  Surgeon: Tobi Bastos, MD;  Location: [facility];  Service: Orthopedics;  Laterality: Right;   Family History  Problem Relation Age of Onset  . Colon cancer Father   . Colon cancer Paternal Grandfather   . Colon cancer Maternal Grandfather   . Bone cancer Maternal Uncle    Social History  Substance Use Topics  . Smoking status:  Never Smoker   . Smokeless tobacco: Never Used  . Alcohol Use: No     Comment: rarely   OB History    No data available     Review of Systems  All other systems reviewed and are negative.     Allergies  Review of patient's allergies indicates no known allergies.  Home Medications   Prior to Admission medications   Medication Sig Start Date End Date Taking? Authorizing Provider  ACCU-CHEK AVIVA PLUS test strip USE BID FOR GLUCOSE TESTING 12/19/14   Historical Provider, MD  acyclovir (ZOVIRAX) 400 MG tablet TK 1 T PO  BID FOR 10 DAYS PRN 11/25/14   Historical Provider, MD  amLODipine (NORVASC) 5 MG tablet Take 5 mg by mouth daily.    Historical Provider, MD  B-D ULTRAFINE III SHORT PEN 31G X 8 MM MISC  09/27/14   Historical Provider, MD  cyclobenzaprine (FLEXERIL) 5 MG tablet Take 1 tablet (5 mg total) by mouth 3 (three) times daily as needed for muscle spasms. 07/07/15   Melony Overly, MD  diclofenac sodium (VOLTAREN) 1 % GEL Apply 4 g topically 4 (four) times daily. Please instruct in dosing. 04/27/15   Billy Fischer, MD  gabapentin (NEURONTIN) 100 MG capsule Take 1 capsule (100 mg total) by mouth at bedtime. 10/25/14   Trula Slade, DPM  gabapentin (NEURONTIN) 300 MG capsule Take  1 capsule (300 mg total) by mouth 3 (three) times daily. 02/09/15   Trula Slade, DPM  glimepiride (AMARYL) 2 MG tablet Take 4 mg by mouth daily before breakfast.     Historical Provider, MD  glimepiride (AMARYL) 4 MG tablet  12/12/14   Historical Provider, MD  Liraglutide (VICTOZA) 18 MG/3ML SOLN injection Inject 1.8 mg into the skin daily after breakfast.     Historical Provider, MD  lisinopril (PRINIVIL,ZESTRIL) 5 MG tablet Take 5 mg by mouth daily.    Historical Provider, MD  meloxicam (MOBIC) 7.5 MG tablet Take 1 tablet (7.5 mg total) by mouth daily. 07/07/15   Melony Overly, MD  metFORMIN (GLUCOPHAGE) 1000 MG tablet  09/27/14   Historical Provider, MD  metFORMIN (GLUCOPHAGE) 500 MG tablet Take 500 mg  by mouth 2 (two) times daily with a meal.    Historical Provider, MD  methocarbamol (ROBAXIN) 500 MG tablet Take 1 tablet (500 mg total) by mouth every 6 (six) hours as needed. Patient taking differently: Take 500 mg by mouth every 6 (six) hours as needed for muscle spasms.  08/26/12   Amber Cecilio Asper, PA-C  methocarbamol (ROBAXIN) 750 MG tablet TK 1 T PO  BID PRN 12/19/14   Historical Provider, MD  NONFORMULARY OR COMPOUNDED ITEM [facility] compound - Neuropathy Musculoskeletal containing Amantadine 3%, Carbamazapine 3%, Gabapentin 6%, Meloxicam 0.5%, Pentoxifyline 3% dispense 180gram +2refills, apply 1-2 gram to affected area 3-4 times daily.    Historical Provider, MD  NONFORMULARY OR COMPOUNDED [place] compound:  (BDGT) Neuropathy Musculoskeletal - Baclofen 2%, Diclofenac 5%, Gabapentin 6%, Tetracaine 3% dispense 180grams, apply 1-2 grams to affected area 3-4 times daily, +2refills. 05/14/15   Trula Slade, DPM  omeprazole (PRILOSEC) 20 MG capsule  10/05/14   Historical Provider, MD  predniSONE (DELTASONE) 50 MG tablet Take 1 pill daily for 5 days. 07/07/15   Melony Overly, MD  pregabalin (LYRICA) 150 MG capsule Take 1 capsule (150 mg total) by mouth 2 (two) times daily. 12/29/14   Trula Slade, DPM  pregabalin (LYRICA) 75 MG capsule Take 150mg  (two capsules) in the morning and 150mg  (two capsules) in the evening 12/07/14   Trula Slade, DPM  PREMARIN vaginal cream APPLY 1/2 GRAM VAGINALLY 3 TIMES PER WEEK EVERY NIGHT AT BEDTIME. 01/03/15   Dorothyann Gibbs, NP  tiZANidine (ZANAFLEX) 4 MG tablet  10/17/14   Historical Provider, MD  VICTOZA 18 MG/3ML SOPN  09/27/14   Historical Provider, MD   BP 148/60 mmHg  Pulse 101  Temp(Src) 98.2 F (36.8 C) (Oral)  Resp 16  SpO2 93% Physical Exam  Constitutional: She is oriented to person, place, and time. She appears well-developed and well-nourished. No distress.  HENT:  Head: Normocephalic and atraumatic.  Mouth/Throat: No  oropharyngeal exudate.  Eyes: Conjunctivae and EOM are normal. Pupils are equal, round, and reactive to light. Right eye exhibits no discharge. Left eye exhibits no discharge. No scleral icterus.  Cardiovascular: Normal rate, regular rhythm, normal heart sounds and intact distal pulses.  Exam reveals no gallop and no friction rub.   No murmur heard. Pulmonary/Chest: Effort normal. No respiratory distress. She has wheezes. She has no rales. She exhibits tenderness.  Diffuse, audible inspiratory and expiratory wheezes in all lung fields. Breathing is labored but no respiratory distress noted.  Abdominal: Soft. She exhibits no distension. There is no tenderness. There is no guarding.  Musculoskeletal: Normal range of motion. She exhibits no edema.  Neurological: She  is alert and oriented to person, place, and time.  Skin: Skin is warm and dry. No rash noted. She is not diaphoretic. No erythema. No pallor.  Psychiatric: She has a normal mood and affect. Her behavior is normal.  Nursing note and vitals reviewed.   ED Course  Procedures (including critical care time) Labs Review Labs Reviewed  BASIC METABOLIC PANEL - Abnormal; Notable for the following:    CO2 20 (*)    Glucose, Bld 179 (*)    BUN 25 (*)    Creatinine, Ser 1.16 (*)    GFR calc non Af Amer 49 (*)    GFR calc Af Amer 57 (*)    All other components within normal limits  CBC WITH DIFFERENTIAL/PLATELET - Abnormal; Notable for the following:    RBC 3.79 (*)    Hemoglobin 9.8 (*)    HCT 31.2 (*)    MCH 25.9 (*)    RDW 17.2 (*)    Platelets 90 (*)    Eosinophils Absolute 0.8 (*)    All other components within normal limits  GLUCOSE, CAPILLARY - Abnormal; Notable for the following:    Glucose-Capillary 311 (*)    All other components within normal limits  I-STAT CG4 LACTIC ACID, ED - Abnormal; Notable for the following:    Lactic Acid, Venous 5.27 (*)    All other components within normal limits  BRAIN NATRIURETIC PEPTIDE   PHOSPHORUS  MAGNESIUM  TSH  HEMOGLOBIN A1C  URINALYSIS, ROUTINE W REFLEX MICROSCOPIC (NOT AT [facility])  Randolm Idol, ED    Imaging Review Dg Chest 2 View  10/21/2015  CLINICAL DATA:  Shortness of breath and wheezing, hypertension, diabetes mellitus EXAM: CHEST  2 VIEW COMPARISON:  08/18/2012 FINDINGS: Borderline enlargement of cardiac silhouette. Mediastinal contours and pulmonary vascularity normal. Chronic bronchitic changes and mild LEFT basilar scarring. Minimal bibasilar atelectasis. No acute infiltrate, pleural effusion or pneumothorax. Osseous structures unremarkable. IMPRESSION: RIGHT bronchitic changes and LEFT basilar scarring with minimal bibasilar atelectasis. Electronically Signed   By: Lavonia Dana M.D.   On: 10/21/2015 11:55   I have personally reviewed and evaluated these images and lab results as part of my medical decision-making.   EKG Interpretation None      MDM   Final diagnoses:  Bronchitis  Shortness of breath   65 year old female with past medical history of HTN, DM, HLD presents to the ED today complaining of cough, wheezing and shortness of breath onset 3 days ago. Patient has audible wheezes on exam in all lung fields and appears to have labored breathing. However, No overt respiratory distress noted. She has respiratory rate of 18. Initially no tachycardia present. She was given 1 DuoNeb which did not improve her symptoms. She was given continuous nebulizer and given 10 mg IV Decadron. No leukocytosis. Patient is afebrile. Chest x-ray negative for pneumonia. There is right bronchitic changes and left basilar scarring with minimal bibasilar atelectasis.  1:04 PM upon reexamination after Decadron and continuous neb patient states that she does not feel any better. She does report that approximately half of her continuous nebulizer spelled out so she did not get a complete dose. Patient now tachycardic after albuterol treatment. Labored breathing still present.  Lung exam is unimproved. We'll try magnesium but patient will likely need admission for continued treatment.  Pt unchanged in exam after magnesium. Recommend admission for continued nebulizers. Spoke with hospitalist, Dr. Dyann Kief who will admit pt to his service  Lactate was ordered as pt became SIRS  positive with tachycardia and increased respiratory rate 3:37 PM lactate elevated at 5.27. Feel that this is likely due to work of breathing. Still do not suspect sepsis in this pt. No leukocytosis. Afebrile. Discussed with Dr. Dyann Kief, hospitalist who agrees. He will still order fluids for the pt and administer abx to cover atypicals.   Patient was discussed with and seen by Dr. Kathrynn Humble who agrees with the treatment plan.      Dondra Spry [place], PA-C 10/21/15 2003  Varney Biles, MD 10/23/15 0200

## 2015-10-21 NOTE — ED Notes (Signed)
Respiratory notified of continuous neb.order

## 2015-10-21 NOTE — ED Notes (Signed)
SOB, cough x 3 days with a headache. Denies CP, abd pain, N/V/D. States she had been having a productive cough but the mucus stopped coming up. Loud expiratory wheezes in all lung fields, denies hx of asthma, no smoking history.

## 2015-10-21 NOTE — ED Notes (Signed)
PA at bedside.

## 2015-10-21 NOTE — Progress Notes (Signed)
Notified Dr Dyann Kief patient on unit.

## 2015-10-21 NOTE — ED Notes (Signed)
Attempted to call report. RN unavailable 

## 2015-10-22 DIAGNOSIS — N179 Acute kidney failure, unspecified: Secondary | ICD-10-CM | POA: Diagnosis not present

## 2015-10-22 DIAGNOSIS — I10 Essential (primary) hypertension: Secondary | ICD-10-CM | POA: Diagnosis not present

## 2015-10-22 DIAGNOSIS — J209 Acute bronchitis, unspecified: Secondary | ICD-10-CM | POA: Diagnosis not present

## 2015-10-22 DIAGNOSIS — M129 Arthropathy, unspecified: Secondary | ICD-10-CM | POA: Diagnosis not present

## 2015-10-22 LAB — GLUCOSE, CAPILLARY
Glucose-Capillary: 326 mg/dL — ABNORMAL HIGH (ref 65–99)
Glucose-Capillary: 389 mg/dL — ABNORMAL HIGH (ref 65–99)
Glucose-Capillary: 314 mg/dL — ABNORMAL HIGH (ref 65–99)
Glucose-Capillary: 323 mg/dL — ABNORMAL HIGH (ref 65–99)

## 2015-10-22 LAB — LACTIC ACID, PLASMA: Lactic Acid, Venous: 2 mmol/L (ref 0.5–2.0)

## 2015-10-22 MED ORDER — IPRATROPIUM-ALBUTEROL 0.5-2.5 (3) MG/3ML IN SOLN
3.0000 mL | Freq: Four times a day (QID) | RESPIRATORY_TRACT | Status: DC
Start: 2015-10-22 — End: 2015-10-22

## 2015-10-22 MED ORDER — INSULIN DETEMIR 100 UNIT/ML ~~LOC~~ SOLN
22.0000 [IU] | Freq: Every day | SUBCUTANEOUS | Status: DC
  Administered 2015-10-22: 22 [IU] via SUBCUTANEOUS
  Filled 2015-10-22 (×2): qty 0.22

## 2015-10-22 MED ORDER — IPRATROPIUM-ALBUTEROL 0.5-2.5 (3) MG/3ML IN SOLN
3.0000 mL | Freq: Two times a day (BID) | RESPIRATORY_TRACT | Status: DC
  Administered 2015-10-22 – 2015-10-23 (×2): 3 mL via RESPIRATORY_TRACT
  Filled 2015-10-22 (×2): qty 3

## 2015-10-22 NOTE — Progress Notes (Signed)
[facility] PROGRESS NOTE  [name] [id number] DOB: 1950-09-03 DOA: 10/21/2015 PCP: Philis Fendt, MD  Assessment/Plan: 1-Acute bronchitis and bronchiolitis/SOB: -Patient doing better today and even still wheezing and with SOB sensation, much better in comparison with admission  -Will continue treatment with doxycycline, Solu-Medrol, Pulmicort and PRN DuoNeb -Will continue Robitussin to help with cough/expectoration and also flutter valve -continue PRN oxygen supplementation will be provided -Patient without prior history of COPD or asthma; but reports wheezing has been present for multiple months now; concerns for adult asthma component.  2-OSA (obstructive sleep apnea): -Will continue CPAP at bedtime  3-Lactic acidosis: -Most likely associated with ongoing respiratory issue alone with continue use of metformin, in a patient that is mildly dehydrated and with acute on chronic renal failure. -level is back to normal after breathing improved and fluid resuscitation was given -Metformin has been discontinued for now  4-Benign essential HTN: -Overall stable. -Will continue the use of amlodipine -Lasix and lisinopril has been discontinue currently secondary to acute on chronic renal failure.  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 -Will use a sliding scale insulin and also Levemir daily at bedtime -Will follow hemoglobin A1c -Continue Lyrica and Neurontin   7-Acute renal failure superimposed on stage 3 chronic kidney disease ([place]): -Most likely associated to mild dehydration and continue use of nephrotoxic agents (including lisinopril, Lasix, NSAIDs, ETC...) -Nephrotoxic agent continue to be on hold  -Will continue fluid resuscitation; but  will adjust rate -Will follow trend; renal is essentially back to baseline now  8-Chronic arthritis: -Will discontinue the use of NSAIDs for now (due to acute on chronic renal failure) -Will continue the use of as needed also relaxants, Tylenol and as needed vicodin  Code Status:Full Code Family Communication: no family at bedside  Disposition Plan: home when medically stable; hopefully in 24-48 hours   Consultants:  None   Procedures:  See below for x-ray reports   Antibiotics:  Doxycycline   HPI/Subjective: Feeling better and breathing somewhat easier. Still with SOB sensation, dyspnea on exertion, diffuse wheezing and coughing spells.   Objective: Filed Vitals:   10/22/15 0545 10/22/15 1300  BP: 151/68 126/66  Pulse: 94 76  Temp: 99.4 F (37.4 C) 98.4 F (36.9 C)  Resp: 19 19    Intake/Output Summary (Last 24 hours) at 10/22/15 1920 Last data filed at 10/22/15 0546  Gross per 24 hour  Intake    120 ml  Output   1000 ml  Net   -880 ml   Filed Weights   10/21/15 1554  Weight: 88.1 kg (194 lb 3.6 oz)    Exam:   General:  Breathing somewhat better, no CP and afebrile. Still with audible wheezing and dyspnea on exertion.  Cardiovascular: S1 and S2, no rubs or gallops  Respiratory: exp wheezing, improved air movement; no crackles  Abdomen: soft, NT, ND, positive BS  Musculoskeletal: no edema or cyanosis   Data Reviewed: Basic Metabolic Panel:  Recent Labs Lab 10/21/15 1140 10/21/15 1702  NA 140  --   K 3.6  --   CL 107  --   CO2 20*  --   GLUCOSE 179*  --   BUN 25*  --   CREATININE 1.16*  --   CALCIUM 9.4  --   MG  --  2.0  PHOS  --  2.6   CBC:  Recent Labs Lab 10/21/15 1140  WBC 9.3  NEUTROABS 4.5  HGB 9.8*  HCT 31.2*  MCV 82.3  PLT 90*   BNP (last 3 results)  Recent Labs  10/21/15 1140  BNP 10.6   CBG:  Recent Labs Lab 10/21/15 1642 10/21/15 2214 10/22/15 0759 10/22/15 1130 10/22/15 1631  GLUCAP 311* 395*  326* 389* 314*    No results found for this or any previous visit (from the past 240 hour(s)).   Studies: Dg Chest 2 View  10/21/2015  CLINICAL DATA:  Shortness of breath and wheezing, hypertension, diabetes mellitus EXAM: CHEST  2 VIEW COMPARISON:  08/18/2012 FINDINGS: Borderline enlargement of cardiac silhouette. Mediastinal contours and pulmonary vascularity normal. Chronic bronchitic changes and mild LEFT basilar scarring. Minimal bibasilar atelectasis. No acute infiltrate, pleural effusion or pneumothorax. Osseous structures unremarkable. IMPRESSION: RIGHT bronchitic changes and LEFT basilar scarring with minimal bibasilar atelectasis. Electronically Signed   By: Lavonia Dana M.D.   On: 10/21/2015 11:55    Scheduled Meds: . amLODipine  5 mg Oral Daily  . budesonide (PULMICORT) nebulizer solution  0.25 mg Nebulization BID  . doxycycline  100 mg Oral Q12H  . heparin  5,000 Units Subcutaneous Q8H  . insulin aspart  0-15 Units Subcutaneous TID WC  . insulin aspart  0-5 Units Subcutaneous QHS  . insulin detemir  22 Units Subcutaneous QHS  . ipratropium-albuterol  3 mL Nebulization BID  . methylPREDNISolone (SOLU-MEDROL) injection  40 mg Intravenous Q12H  . pantoprazole  40 mg Oral Daily  . pregabalin  150 mg Oral BID  . simvastatin  10 mg Oral Daily   Continuous Infusions: . sodium chloride 100 mL/hr at 10/22/15 1225    Principal Problem:   Acute bronchitis and bronchiolitis Active Problems:   OSA (obstructive sleep apnea)   Shortness of breath   Lactic acidosis   Benign essential HTN   GERD (gastroesophageal reflux disease)   Diabetes mellitus with nephropathy (HCC)   Acute renal failure superimposed on stage 3 chronic kidney disease (H[facility])   Chronic arthritis   SOB (shortness of breath)   Time spent: 30 minutes   Barton Dubois  [facility] Pager [phone]. If 7PM-7AM, please contact night-coverage at [email or web address], password [facility] 10/22/2015, 7:20 PM

## 2015-10-23 DIAGNOSIS — J209 Acute bronchitis, unspecified: Secondary | ICD-10-CM | POA: Diagnosis not present

## 2015-10-23 DIAGNOSIS — M129 Arthropathy, unspecified: Secondary | ICD-10-CM | POA: Diagnosis not present

## 2015-10-23 DIAGNOSIS — N179 Acute kidney failure, unspecified: Secondary | ICD-10-CM | POA: Diagnosis not present

## 2015-10-23 DIAGNOSIS — I10 Essential (primary) hypertension: Secondary | ICD-10-CM | POA: Diagnosis not present

## 2015-10-23 LAB — BASIC METABOLIC PANEL
Anion gap: 10 (ref 5–15)
BUN: 33 mg/dL — ABNORMAL HIGH (ref 6–20)
CO2: 18 mmol/L — ABNORMAL LOW (ref 22–32)
Calcium: 8.8 mg/dL — ABNORMAL LOW (ref 8.9–10.3)
Chloride: 109 mmol/L (ref 101–111)
Creatinine, Ser: 1.17 mg/dL — ABNORMAL HIGH (ref 0.44–1.00)
GFR calc Af Amer: 57 mL/min — ABNORMAL LOW (ref >60)
GFR calc non Af Amer: 49 mL/min — ABNORMAL LOW (ref >60)
Glucose, Bld: 338 mg/dL — ABNORMAL HIGH (ref 65–99)
Potassium: 4.9 mmol/L (ref 3.5–5.1)
Sodium: 137 mmol/L (ref 135–145)

## 2015-10-23 LAB — CBC
HCT: 28.2 % — ABNORMAL LOW (ref 36.0–46.0)
Hemoglobin: 9 g/dL — ABNORMAL LOW (ref 12.0–15.0)
MCH: 26.4 pg (ref 26.0–34.0)
MCHC: 31.9 g/dL (ref 30.0–36.0)
MCV: 82.7 fL (ref 78.0–100.0)
Platelets: 92 10*3/uL — ABNORMAL LOW (ref 150–400)
RBC: 3.41 MIL/uL — ABNORMAL LOW (ref 3.87–5.11)
RDW: 17.2 % — ABNORMAL HIGH (ref 11.5–15.5)
WBC: 4.9 10*3/uL (ref 4.0–10.5)

## 2015-10-23 LAB — HEMOGLOBIN A1C
Hgb A1c MFr Bld: 7.4 % — ABNORMAL HIGH (ref 4.8–5.6)
Mean Plasma Glucose: 166 mg/dL

## 2015-10-23 LAB — GLUCOSE, CAPILLARY
Glucose-Capillary: 329 mg/dL — ABNORMAL HIGH (ref 65–99)
Glucose-Capillary: 303 mg/dL — ABNORMAL HIGH (ref 65–99)

## 2015-10-23 MED ORDER — DOXYCYCLINE HYCLATE 100 MG PO TABS: 100.0000 mg | ORAL_TABLET | Freq: Two times a day (BID) | ORAL | Status: DC

## 2015-10-23 MED ORDER — GUAIFENESIN-DM 100-10 MG/5ML PO SYRP: 5.0000 mL | ORAL_SOLUTION | ORAL | Status: DC | PRN

## 2015-10-23 MED ORDER — LOSARTAN POTASSIUM 25 MG PO TABS: 25.0000 mg | ORAL_TABLET | Freq: Every day | ORAL | Status: AC

## 2015-10-23 MED ORDER — BUDESONIDE-FORMOTEROL FUMARATE 80-4.5 MCG/ACT IN AERO: 2.0000 | INHALATION_SPRAY | Freq: Two times a day (BID) | RESPIRATORY_TRACT | Status: DC

## 2015-10-23 MED ORDER — PREDNISONE 20 MG PO TABS: ORAL_TABLET | ORAL | Status: DC

## 2015-10-23 MED ORDER — IPRATROPIUM-ALBUTEROL 0.5-2.5 (3) MG/3ML IN SOLN: 3.0000 mL | Freq: Two times a day (BID) | RESPIRATORY_TRACT | Status: DC

## 2015-10-23 NOTE — Discharge Summary (Signed)
Physician Discharge Summary  ZAMARIAH DAMERON [id number] DOB: 1951/01/28 DOA: 10/21/2015  PCP: Philis Fendt, MD  Admit date: 10/21/2015 Discharge date: 10/23/2015  Time spent: 35 minutes  Recommendations for Outpatient Follow-up:  Reassess breathing and arrange outpatient follow up with pulmonary service for PFT's and further adjustment on maintenance therapy Reassess BP as lisinopril discontinued and cozaar added  Please repeat BMET to follow electrolytes and renal function  Consider exchanging metformin due to CKD and risk for acidosis   Discharge Diagnoses:  Principal Problem:   Acute bronchitis and bronchiolitis Active Problems:   OSA (obstructive sleep apnea)   Shortness of breath   Lactic acidosis   Benign essential HTN   GERD (gastroesophageal reflux disease)   Diabetes mellitus with nephropathy ([place])   Acute renal failure superimposed on stage 3 chronic kidney disease (HCC)   Chronic arthritis   SOB (shortness of breath)   Discharge Condition: stable and improved. Discharge hoe with instructions to follow up with PCP in 10 days.  Diet recommendation: low calorie diet, heart healthy and modified carb diet   Filed Weights   10/21/15 1554  Weight: 88.1 kg (194 lb 3.6 oz)    History of present illness:  65 y.o. female with PMH significant for hypertension, diabetes mellitus with nephropathy and neuropathy, chronic renal failure (stage III at baseline), obstructive sleep apnea, chronic arthritis and gastroesophageal reflux disease; who presented to the emergency department secondary to shortness of breath and productive cough. Patient reports that she had been experiencing some wheezing over the last 3 months or so with worsening in her breathing in the last 3-4 days prior to admission. Patient endorses decreased activity tolerance due to her dyspnea and also worsening cough with green expectoration. She denies chest pain, fever, chills, lower extremity swelling,  abdominal pain, nausea, vomiting, dysuria, melena, hematochezia, hemoptysis or any other acute complaints.   Of note patient has been started recently on ventolin by her PCP in order to assist with ongoing wheezing.   Hospital Course:  1-Acute bronchitis and bronchiolitis/SOB: -Patient doing much better today and ready/stable for discharge  -Will continue treatment with doxycycline, Steroids tapering and at discharge will use symbicort and PRN DuoNeb/albuterol inhaler -Will continue Robitussin to help with cough/expectoration and also flutter valve -no oxygen supplementation needed at discharge -Patient without prior history of COPD or asthma; but reports wheezing has been present for multiple months now; concerns for adult asthma component. -recommending outpatient evaluation by pulmonary service   2-OSA (obstructive sleep apnea): -Will continue CPAP at bedtime  3-Lactic acidosis: -Most likely associated with ongoing respiratory issue alone with continue use of metformin, in a patient that was mildly dehydrated and with acute on chronic renal failure. -level is back to normal after breathing improved and fluid resuscitation was given -Metformin has been resume at discharge   4-Benign essential HTN: -Overall stable. -Will continue home antihypertensive regimen, except for lisinopril -will substitute for low dose cozaar instead (in order to decrease chances of bronchospasm induced by ACE i)  5-GERD (gastroesophageal reflux disease): -Continue PPI  6-Diabetes mellitus with nephropathy and neuropathy ([place]): -Will resume oral hypoglycemic regimen -please consider changing metformin given CKD on this patient and risk of acidosis  -Continue Lyrica and Neurontin   7-Acute renal failure superimposed on stage 3 chronic kidney disease ([place]): -Most likely associated to mild dehydration and continue use of nephrotoxic agents (including lisinopril, Lasix, NSAIDs, ETC...) -renal function back  to her baseline  -advise to maintain adequate hydration -BMET to  be repeated at follow up visit   8-Chronic arthritis: -advise to minimize NSAID's use -Will continue the use of as needed muscle relaxants, Tylenol and as needed vicodin (as previously prescribed)  9-obesity -Body mass index is 33.32 kg/(m^2). -low calorie diet and exercise discussed with patient   Procedures:  See below for x-ray reports   Consultations:  None   Discharge Exam: Filed Vitals:   10/22/15 2138 10/23/15 0519  BP: 140/62 111/58  Pulse: 96 65  Temp: 98.6 F (37 C) 98.3 F (36.8 C)  Resp: 20 20    General: Breathing a lot better, no CP and afebrile. Currently no complaining of SOB; just very little exp wheezing and able to ambulate without dyspnea symptoms.   Cardiovascular: S1 and S2, no rubs or gallops  Respiratory: mild exp wheezing, improved air movement; no crackles  Abdomen: soft, NT, ND, positive BS  Musculoskeletal: no edema or cyanosis  Discharge Instructions   Discharge Instructions    Diet - low sodium heart healthy    Complete by:  As directed      Discharge instructions    Complete by:  As directed   Maintain adequate hydration Take medications as prescribed Please arrange follow up with PCP in 10 days Avoid second hand smoking and overexposure to pollen Follow heart healthy, low calorie and modified carbohydrates diet          Current Discharge Medication List    START taking these medications   Details  budesonide-formoterol (SYMBICORT) 80-4.5 MCG/ACT inhaler Inhale 2 puffs into the lungs 2 (two) times daily. Qty: 1 Inhaler, Refills: 12    doxycycline (VIBRA-TABS) 100 MG tablet Take 1 tablet (100 mg total) by mouth every 12 (twelve) hours. Qty: 14 tablet, Refills: 0    guaiFENesin-dextromethorphan (ROBITUSSIN DM) 100-10 MG/5ML syrup Take 5 mLs by mouth every 4 (four) hours as needed for cough. Qty: 118 mL, Refills: 0    ipratropium-albuterol (DUONEB)  0.5-2.5 (3) MG/3ML SOLN Take 3 mLs by nebulization 2 (two) times daily. Qty: 360 mL, Refills: 1    losartan (COZAAR) 25 MG tablet Take 1 tablet (25 mg total) by mouth daily. Qty: 30 tablet, Refills: 1    predniSONE (DELTASONE) 20 MG tablet Take 3 tablet by mouth daily X 1 day; then 2 tablets by mouth daily X 2 days; then 1 tablet by mouth daily X 2 days; then 1/2 tablet by mouth daily X 2 days and stop prednisone. Qty: 12 tablet, Refills: 0      CONTINUE these medications which have NOT CHANGED   Details  ACCU-CHEK AVIVA PLUS test strip USE BID FOR GLUCOSE TESTING Refills: 11    amLODipine (NORVASC) 5 MG tablet Take 5 mg by mouth daily.    B-D ULTRAFINE III SHORT PEN 31G X 8 MM MISC     cyclobenzaprine (FLEXERIL) 5 MG tablet Take 1 tablet (5 mg total) by mouth 3 (three) times daily as needed for muscle spasms. Qty: 30 tablet, Refills: 0    diclofenac sodium (VOLTAREN) 1 % GEL Apply 4 g topically 4 (four) times daily. Please instruct in dosing. Qty: 3 Tube, Refills: 0    furosemide (LASIX) 20 MG tablet Take 1 tablet by mouth daily. Refills: 2    glimepiride (AMARYL) 2 MG tablet Take 4 mg by mouth daily before breakfast.     Liraglutide (VICTOZA) 18 MG/3ML SOLN injection Inject 1.8 mg into the skin daily after breakfast.     metFORMIN (GLUCOPHAGE) 1000 MG tablet 1,000 mg  2 (two) times daily with a meal.     omeprazole (PRILOSEC) 20 MG capsule     potassium chloride (K-DUR) 10 MEQ tablet Take 1 tablet by mouth daily. Refills: 2    pregabalin (LYRICA) 150 MG capsule Take 1 capsule (150 mg total) by mouth 2 (two) times daily. Qty: 60 capsule, Refills: 2    simvastatin (ZOCOR) 10 MG tablet Take 1 tablet by mouth daily. Refills: 5    tiZANidine (ZANAFLEX) 4 MG tablet     VENTOLIN HFA 108 (90 Base) MCG/ACT inhaler Inhale 2 puffs into the lungs 3 (three) times daily. Refills: 5    gabapentin (NEURONTIN) 100 MG capsule Take 1 capsule (100 mg total) by mouth at bedtime. Qty:  90 capsule, Refills: 3    PREMARIN vaginal cream APPLY 1/2 GRAM VAGINALLY 3 TIMES PER WEEK EVERY NIGHT AT BEDTIME. Qty: 30 g, Refills: 0      STOP taking these medications     lisinopril (PRINIVIL,ZESTRIL) 5 MG tablet      methocarbamol (ROBAXIN) 500 MG tablet        No Known Allergies Follow-up Information    Follow up with Philis Fendt, MD. Schedule an appointment as soon as possible for a visit in 10 days.   Specialty:  Internal Medicine   Contact information:   [place] [address] [place] [address] [phone]       The results of significant diagnostics from this hospitalization (including imaging, microbiology, ancillary and laboratory) are listed below for reference.    Significant Diagnostic Studies: Dg Chest 2 View  10/21/2015  CLINICAL DATA:  Shortness of breath and wheezing, hypertension, diabetes mellitus EXAM: CHEST  2 VIEW COMPARISON:  08/18/2012 FINDINGS: Borderline enlargement of cardiac silhouette. Mediastinal contours and pulmonary vascularity normal. Chronic bronchitic changes and mild LEFT basilar scarring. Minimal bibasilar atelectasis. No acute infiltrate, pleural effusion or pneumothorax. Osseous structures unremarkable. IMPRESSION: RIGHT bronchitic changes and LEFT basilar scarring with minimal bibasilar atelectasis. Electronically Signed   By: Lavonia Dana M.D.   On: 10/21/2015 11:55   Labs: Basic Metabolic Panel:  Recent Labs Lab 10/21/15 1140 10/21/15 1702 10/23/15 0540  NA 140  --  137  K 3.6  --  4.9  CL 107  --  109  CO2 20*  --  18*  GLUCOSE 179*  --  338*  BUN 25*  --  33*  CREATININE 1.16*  --  1.17*  CALCIUM 9.4  --  8.8*  MG  --  2.0  --   PHOS  --  2.6  --    CBC:  Recent Labs Lab 10/21/15 1140 10/23/15 0540  WBC 9.3 4.9  NEUTROABS 4.5  --   HGB 9.8* 9.0*  HCT 31.2* 28.2*  MCV 82.3 82.7  PLT 90* 92*   BNP (last 3 results)  Recent Labs  10/21/15 1140  BNP 10.6   CBG:  Recent Labs Lab 10/22/15 1130  10/22/15 1631 10/22/15 2147 10/23/15 0718 10/23/15 1120  GLUCAP 389* 314* 323* 329* 303*    Signed:  Barton Dubois MD.  [facility] 10/23/2015, 1:32 PM

## 2016-06-24 DIAGNOSIS — J209 Acute bronchitis, unspecified: Secondary | ICD-10-CM | POA: Diagnosis not present

## 2016-07-07 DIAGNOSIS — H6691 Otitis media, unspecified, right ear: Secondary | ICD-10-CM | POA: Diagnosis not present

## 2016-07-07 DIAGNOSIS — E119 Type 2 diabetes mellitus without complications: Secondary | ICD-10-CM | POA: Diagnosis not present

## 2016-07-07 DIAGNOSIS — J40 Bronchitis, not specified as acute or chronic: Secondary | ICD-10-CM | POA: Diagnosis not present

## 2016-07-07 DIAGNOSIS — E1140 Type 2 diabetes mellitus with diabetic neuropathy, unspecified: Secondary | ICD-10-CM | POA: Diagnosis not present

## 2016-07-07 DIAGNOSIS — E785 Hyperlipidemia, unspecified: Secondary | ICD-10-CM | POA: Diagnosis not present

## 2016-07-16 DIAGNOSIS — I10 Essential (primary) hypertension: Secondary | ICD-10-CM | POA: Diagnosis not present

## 2016-07-16 DIAGNOSIS — E119 Type 2 diabetes mellitus without complications: Secondary | ICD-10-CM | POA: Diagnosis not present

## 2016-07-16 DIAGNOSIS — E785 Hyperlipidemia, unspecified: Secondary | ICD-10-CM | POA: Diagnosis not present

## 2016-07-16 DIAGNOSIS — E1140 Type 2 diabetes mellitus with diabetic neuropathy, unspecified: Secondary | ICD-10-CM | POA: Diagnosis not present

## 2016-07-16 DIAGNOSIS — H539 Unspecified visual disturbance: Secondary | ICD-10-CM | POA: Diagnosis not present

## 2016-07-16 DIAGNOSIS — G4733 Obstructive sleep apnea (adult) (pediatric): Secondary | ICD-10-CM | POA: Diagnosis not present

## 2016-07-25 DIAGNOSIS — J209 Acute bronchitis, unspecified: Secondary | ICD-10-CM | POA: Diagnosis not present

## 2016-08-05 DIAGNOSIS — I10 Essential (primary) hypertension: Secondary | ICD-10-CM | POA: Diagnosis not present

## 2016-08-05 DIAGNOSIS — E1140 Type 2 diabetes mellitus with diabetic neuropathy, unspecified: Secondary | ICD-10-CM | POA: Diagnosis not present

## 2016-08-05 DIAGNOSIS — E119 Type 2 diabetes mellitus without complications: Secondary | ICD-10-CM | POA: Diagnosis not present

## 2016-08-05 DIAGNOSIS — H6691 Otitis media, unspecified, right ear: Secondary | ICD-10-CM | POA: Diagnosis not present

## 2016-08-05 DIAGNOSIS — E785 Hyperlipidemia, unspecified: Secondary | ICD-10-CM | POA: Diagnosis not present

## 2016-08-06 DIAGNOSIS — G43719 Chronic migraine without aura, intractable, without status migrainosus: Secondary | ICD-10-CM | POA: Diagnosis not present

## 2016-08-19 DIAGNOSIS — J45909 Unspecified asthma, uncomplicated: Secondary | ICD-10-CM | POA: Diagnosis not present

## 2016-08-19 DIAGNOSIS — H538 Other visual disturbances: Secondary | ICD-10-CM | POA: Diagnosis not present

## 2016-08-19 DIAGNOSIS — Z0000 Encounter for general adult medical examination without abnormal findings: Secondary | ICD-10-CM | POA: Diagnosis not present

## 2016-08-19 DIAGNOSIS — M545 Low back pain: Secondary | ICD-10-CM | POA: Diagnosis not present

## 2016-08-19 DIAGNOSIS — R0789 Other chest pain: Secondary | ICD-10-CM | POA: Diagnosis not present

## 2016-08-19 DIAGNOSIS — Z01118 Encounter for examination of ears and hearing with other abnormal findings: Secondary | ICD-10-CM | POA: Diagnosis not present

## 2016-08-19 DIAGNOSIS — E119 Type 2 diabetes mellitus without complications: Secondary | ICD-10-CM | POA: Diagnosis not present

## 2016-08-21 DIAGNOSIS — J45909 Unspecified asthma, uncomplicated: Secondary | ICD-10-CM | POA: Diagnosis not present

## 2016-08-21 DIAGNOSIS — E1140 Type 2 diabetes mellitus with diabetic neuropathy, unspecified: Secondary | ICD-10-CM | POA: Diagnosis not present

## 2016-08-21 DIAGNOSIS — E119 Type 2 diabetes mellitus without complications: Secondary | ICD-10-CM | POA: Diagnosis not present

## 2016-09-05 DIAGNOSIS — I10 Essential (primary) hypertension: Secondary | ICD-10-CM | POA: Diagnosis not present

## 2016-09-12 DIAGNOSIS — G4733 Obstructive sleep apnea (adult) (pediatric): Secondary | ICD-10-CM | POA: Diagnosis not present

## 2016-10-02 DIAGNOSIS — E119 Type 2 diabetes mellitus without complications: Secondary | ICD-10-CM | POA: Diagnosis not present

## 2016-10-02 DIAGNOSIS — E785 Hyperlipidemia, unspecified: Secondary | ICD-10-CM | POA: Diagnosis not present

## 2016-10-02 DIAGNOSIS — I10 Essential (primary) hypertension: Secondary | ICD-10-CM | POA: Diagnosis not present

## 2016-12-02 DIAGNOSIS — E119 Type 2 diabetes mellitus without complications: Secondary | ICD-10-CM | POA: Diagnosis not present

## 2016-12-02 DIAGNOSIS — Z01118 Encounter for examination of ears and hearing with other abnormal findings: Secondary | ICD-10-CM | POA: Diagnosis not present

## 2016-12-02 DIAGNOSIS — E785 Hyperlipidemia, unspecified: Secondary | ICD-10-CM | POA: Diagnosis not present

## 2016-12-02 DIAGNOSIS — E1140 Type 2 diabetes mellitus with diabetic neuropathy, unspecified: Secondary | ICD-10-CM | POA: Diagnosis not present

## 2016-12-02 DIAGNOSIS — Z0000 Encounter for general adult medical examination without abnormal findings: Secondary | ICD-10-CM | POA: Diagnosis not present

## 2016-12-02 DIAGNOSIS — J45909 Unspecified asthma, uncomplicated: Secondary | ICD-10-CM | POA: Diagnosis not present

## 2016-12-22 DIAGNOSIS — G4733 Obstructive sleep apnea (adult) (pediatric): Secondary | ICD-10-CM | POA: Diagnosis not present

## 2016-12-23 DIAGNOSIS — I10 Essential (primary) hypertension: Secondary | ICD-10-CM | POA: Diagnosis not present

## 2016-12-23 DIAGNOSIS — E119 Type 2 diabetes mellitus without complications: Secondary | ICD-10-CM | POA: Diagnosis not present

## 2016-12-23 DIAGNOSIS — E785 Hyperlipidemia, unspecified: Secondary | ICD-10-CM | POA: Diagnosis not present

## 2016-12-29 DIAGNOSIS — M545 Low back pain: Secondary | ICD-10-CM | POA: Diagnosis not present

## 2017-01-21 DIAGNOSIS — E785 Hyperlipidemia, unspecified: Secondary | ICD-10-CM | POA: Diagnosis not present

## 2017-01-21 DIAGNOSIS — E1140 Type 2 diabetes mellitus with diabetic neuropathy, unspecified: Secondary | ICD-10-CM | POA: Diagnosis not present

## 2017-01-21 DIAGNOSIS — E119 Type 2 diabetes mellitus without complications: Secondary | ICD-10-CM | POA: Diagnosis not present

## 2017-01-21 DIAGNOSIS — I10 Essential (primary) hypertension: Secondary | ICD-10-CM | POA: Diagnosis not present

## 2017-01-21 DIAGNOSIS — J45909 Unspecified asthma, uncomplicated: Secondary | ICD-10-CM | POA: Diagnosis not present

## 2017-01-23 ENCOUNTER — Encounter: Payer: Self-pay | Admitting: Gastroenterology

## 2017-02-23 ENCOUNTER — Encounter: Payer: Self-pay | Admitting: Gastroenterology

## 2017-02-24 DIAGNOSIS — E119 Type 2 diabetes mellitus without complications: Secondary | ICD-10-CM | POA: Diagnosis not present

## 2017-02-24 DIAGNOSIS — I10 Essential (primary) hypertension: Secondary | ICD-10-CM | POA: Diagnosis not present

## 2017-02-24 DIAGNOSIS — J45909 Unspecified asthma, uncomplicated: Secondary | ICD-10-CM | POA: Diagnosis not present

## 2017-02-24 DIAGNOSIS — E1140 Type 2 diabetes mellitus with diabetic neuropathy, unspecified: Secondary | ICD-10-CM | POA: Diagnosis not present

## 2017-02-24 DIAGNOSIS — M542 Cervicalgia: Secondary | ICD-10-CM | POA: Diagnosis not present

## 2017-03-17 ENCOUNTER — Ambulatory Visit (AMBULATORY_SURGERY_CENTER): Payer: Self-pay | Admitting: *Deleted

## 2017-03-17 VITALS — Ht 64.0 in | Wt 208.0 lb

## 2017-03-17 DIAGNOSIS — Z800 Family history of malignant neoplasm of digestive organs: Secondary | ICD-10-CM

## 2017-03-17 MED ORDER — NA SULFATE-K SULFATE-MG SULF 17.5-3.13-1.6 GM/177ML PO SOLN: 1.0000 | Freq: Once | ORAL | 0 refills | Status: AC

## 2017-03-17 NOTE — Progress Notes (Signed)
No egg or soy allergy known to patient  No issues with past sedation with any surgeries  or procedures, no intubation problems  No diet pills per patient No home 02 use per patient  No blood thinners per patient  Pt denies issues with constipation  No A fib or A flutter  EMMI video sent to pt's e mail  

## 2017-03-31 ENCOUNTER — Ambulatory Visit (AMBULATORY_SURGERY_CENTER): Payer: Medicare Other | Admitting: Gastroenterology

## 2017-03-31 ENCOUNTER — Encounter: Payer: Self-pay | Admitting: Gastroenterology

## 2017-03-31 VITALS — BP 147/90 | HR 81 | Temp 98.0°F | Resp 16 | Ht 64.0 in | Wt 208.0 lb

## 2017-03-31 DIAGNOSIS — G4733 Obstructive sleep apnea (adult) (pediatric): Secondary | ICD-10-CM | POA: Diagnosis not present

## 2017-03-31 DIAGNOSIS — Z1211 Encounter for screening for malignant neoplasm of colon: Secondary | ICD-10-CM

## 2017-03-31 DIAGNOSIS — Z1212 Encounter for screening for malignant neoplasm of rectum: Secondary | ICD-10-CM | POA: Diagnosis not present

## 2017-03-31 DIAGNOSIS — Z800 Family history of malignant neoplasm of digestive organs: Secondary | ICD-10-CM | POA: Diagnosis present

## 2017-03-31 MED ORDER — SODIUM CHLORIDE 0.9 % IV SOLN: 500.0000 mL | INTRAVENOUS | Status: DC

## 2017-03-31 NOTE — Progress Notes (Signed)
Report given to PACU, vss 

## 2017-03-31 NOTE — Patient Instructions (Signed)
YOU HAD AN ENDOSCOPIC PROCEDURE TODAY AT [facility]:   Refer to the procedure report that was given to you for any specific questions about what was found during the examination.  If the procedure report does not answer your questions, please call your gastroenterologist to clarify.  If you requested that your care partner not be given the details of your procedure findings, then the procedure report has been included in a sealed envelope for you to review at your convenience later.  YOU SHOULD EXPECT: Some feelings of bloating in the abdomen. Passage of more gas than usual.  Walking can help get rid of the air that was put into your GI tract during the procedure and reduce the bloating. If you had a lower endoscopy (such as a colonoscopy or flexible sigmoidoscopy) you may notice spotting of blood in your stool or on the toilet paper. If you underwent a bowel prep for your procedure, you may not have a normal bowel movement for a few days.  Please Note:  You might notice some irritation and congestion in your nose or some drainage.  This is from the oxygen used during your procedure.  There is no need for concern and it should clear up in a day or so.  SYMPTOMS TO REPORT IMMEDIATELY:   Following lower endoscopy (colonoscopy or flexible sigmoidoscopy):  Excessive amounts of blood in the stool  Significant tenderness or worsening of abdominal pains  Swelling of the abdomen that is new, acute  Fever of 100F or higher  For urgent or emergent issues, a gastroenterologist can be reached at any hour by calling [phone].   DIET:  We do recommend a small meal at first, but then you may proceed to your regular diet.  Drink plenty of fluids but you should avoid alcoholic beverages for 24 hours.  ACTIVITY:  You should plan to take it easy for the rest of today and you should NOT DRIVE or use heavy machinery until tomorrow (because of the sedation medicines used during the test).     FOLLOW UP: Our staff will call the number listed on your records the next business day following your procedure to check on you and address any questions or concerns that you may have regarding the information given to you following your procedure. If we do not reach you, we will leave a message.  However, if you are feeling well and you are not experiencing any problems, there is no need to return our call.  We will assume that you have returned to your regular daily activities without incident.  If any biopsies were taken you will be contacted by phone or by letter within the next 1-3 weeks.  Please call us at [phone] if you have not heard about the biopsies in 3 weeks.   Repeat Colonoscopy screening in 5 years   SIGNATURES/CONFIDENTIALITY: You and/or your care partner have signed paperwork which will be entered into your electronic medical record.  These signatures attest to the fact that that the information above on your After Visit Summary has been reviewed and is understood.  Full responsibility of the confidentiality of this discharge information lies with you and/or your care-partner.

## 2017-03-31 NOTE — Op Note (Signed)
[facility] Patient Name: [name] Procedure Date: 03/31/2017 10:14 AM MRN: [id number] Endoscopist: Mallie Mussel L. Loletha Carrow , MD Age: 66 Referring MD:  Date of Birth: Feb 19, 1951 Gender: Female Account #: [id number] Procedure:                Colonoscopy Indications:              Colon cancer screening in patient at increased                            risk: Colorectal cancer in father Medicines:                Monitored Anesthesia Care Procedure:                Pre-Anesthesia Assessment:                           - Prior to the procedure, a History and Physical                            was performed, and patient medications and                            allergies were reviewed. The patient's tolerance of                            previous anesthesia was also reviewed. The risks                            and benefits of the procedure and the sedation                            options and risks were discussed with the patient.                            All questions were answered, and informed consent                            was obtained. Prior Anticoagulants: The patient has                            taken no previous anticoagulant or antiplatelet                            agents. ASA Grade Assessment: II - A patient with                            mild systemic disease. After reviewing the risks                            and benefits, the patient was deemed in                            satisfactory condition to undergo the procedure.  After obtaining informed consent, the colonoscope                            was passed under direct vision. Throughout the                            procedure, the patient's blood pressure, pulse, and                            oxygen saturations were monitored continuously. The                            Colonoscope was introduced through the anus and                            advanced to the the  terminal ileum. The colonoscopy                            was performed without difficulty. The patient                            tolerated the procedure well. The quality of the                            bowel preparation was excellent. The terminal                            ileum, the ileocecal valve and the rectum were                            photographed. The quality of the bowel preparation                            was evaluated using the BBPS [facility] Bowel                            Preparation Scale) with scores of: Right Colon = 3,                            Transverse Colon = 3 and Left Colon = 3 (entire                            mucosa seen well with no residual staining, small                            fragments of stool or opaque liquid). The total                            BBPS score equals 9. The bowel preparation used was                            SUPREP. Scope In: 10:24:12 AM Scope Out: 10:38:14 AM Scope Withdrawal Time: 0  hours 10 minutes 1 second  Total Procedure Duration: 0 hours 14 minutes 2 seconds  Findings:                 The perianal and digital rectal examinations were                            normal.                           The entire examined colon appeared normal on direct                            and retroflexion views. Complications:            No immediate complications. Estimated Blood Loss:     Estimated blood loss: none. Impression:               - The entire examined colon is normal on direct and                            retroflexion views.                           - No specimens collected. Recommendation:           - Patient has a contact number available for                            emergencies. The signs and symptoms of potential                            delayed complications were discussed with the                            patient. Return to normal activities tomorrow.                            Written discharge  instructions were provided to the                            patient.                           - Resume previous diet.                           - Continue present medications.                           - Repeat colonoscopy in 5 years for screening                            purposes. Crystale Giannattasio L. Loletha Carrow, MD 03/31/2017 10:42:13 AM This report has been signed electronically.

## 2017-04-01 ENCOUNTER — Telehealth: Payer: Self-pay

## 2017-04-01 ENCOUNTER — Telehealth: Payer: Self-pay | Admitting: *Deleted

## 2017-04-01 NOTE — Telephone Encounter (Signed)
  Follow up Call-  Call back number 03/31/2017  Post procedure Call Back phone  # [phone]  Permission to leave phone message Yes  Some recent data might be hidden     Patient questions:  Do you have a fever, pain , or abdominal swelling? No. Pain Score  0 *  Have you tolerated food without any problems? Yes.    Have you been able to return to your normal activities? Yes.    Do you have any questions about your discharge instructions: Diet   No. Medications  No. Follow up visit  No.  Do you have questions or concerns about your Care? No.  Actions: * If pain score is 4 or above: No action needed, pain <4.

## 2017-04-01 NOTE — Telephone Encounter (Signed)
Called [phone] and left a messaged we tried to reach pt for a follow up call. maw

## 2017-04-14 DIAGNOSIS — H2512 Age-related nuclear cataract, left eye: Secondary | ICD-10-CM | POA: Diagnosis not present

## 2017-04-14 DIAGNOSIS — E119 Type 2 diabetes mellitus without complications: Secondary | ICD-10-CM | POA: Diagnosis not present

## 2017-04-14 DIAGNOSIS — H2511 Age-related nuclear cataract, right eye: Secondary | ICD-10-CM | POA: Diagnosis not present

## 2017-05-11 DIAGNOSIS — E119 Type 2 diabetes mellitus without complications: Secondary | ICD-10-CM | POA: Diagnosis not present

## 2017-05-11 DIAGNOSIS — E785 Hyperlipidemia, unspecified: Secondary | ICD-10-CM | POA: Diagnosis not present

## 2017-05-11 DIAGNOSIS — I10 Essential (primary) hypertension: Secondary | ICD-10-CM | POA: Diagnosis not present

## 2017-05-11 DIAGNOSIS — J45909 Unspecified asthma, uncomplicated: Secondary | ICD-10-CM | POA: Diagnosis not present

## 2017-05-11 DIAGNOSIS — R110 Nausea: Secondary | ICD-10-CM | POA: Diagnosis not present

## 2017-05-11 DIAGNOSIS — E1140 Type 2 diabetes mellitus with diabetic neuropathy, unspecified: Secondary | ICD-10-CM | POA: Diagnosis not present

## 2017-05-11 DIAGNOSIS — Z23 Encounter for immunization: Secondary | ICD-10-CM | POA: Diagnosis not present

## 2017-07-08 DIAGNOSIS — G4733 Obstructive sleep apnea (adult) (pediatric): Secondary | ICD-10-CM | POA: Diagnosis not present

## 2017-07-27 DIAGNOSIS — I10 Essential (primary) hypertension: Secondary | ICD-10-CM | POA: Diagnosis not present

## 2017-07-27 DIAGNOSIS — E785 Hyperlipidemia, unspecified: Secondary | ICD-10-CM | POA: Diagnosis not present

## 2017-07-27 DIAGNOSIS — Z0110 Encounter for examination of ears and hearing without abnormal findings: Secondary | ICD-10-CM | POA: Diagnosis not present

## 2017-07-27 DIAGNOSIS — Z0000 Encounter for general adult medical examination without abnormal findings: Secondary | ICD-10-CM | POA: Diagnosis not present

## 2017-07-27 DIAGNOSIS — Z0100 Encounter for examination of eyes and vision without abnormal findings: Secondary | ICD-10-CM | POA: Diagnosis not present

## 2017-07-27 DIAGNOSIS — J45909 Unspecified asthma, uncomplicated: Secondary | ICD-10-CM | POA: Diagnosis not present

## 2017-07-27 DIAGNOSIS — E1140 Type 2 diabetes mellitus with diabetic neuropathy, unspecified: Secondary | ICD-10-CM | POA: Diagnosis not present

## 2017-07-27 DIAGNOSIS — E119 Type 2 diabetes mellitus without complications: Secondary | ICD-10-CM | POA: Diagnosis not present

## 2017-08-25 DIAGNOSIS — E785 Hyperlipidemia, unspecified: Secondary | ICD-10-CM | POA: Diagnosis not present

## 2017-08-25 DIAGNOSIS — E1140 Type 2 diabetes mellitus with diabetic neuropathy, unspecified: Secondary | ICD-10-CM | POA: Diagnosis not present

## 2017-08-25 DIAGNOSIS — E119 Type 2 diabetes mellitus without complications: Secondary | ICD-10-CM | POA: Diagnosis not present

## 2017-08-25 DIAGNOSIS — I10 Essential (primary) hypertension: Secondary | ICD-10-CM | POA: Diagnosis not present

## 2017-08-25 DIAGNOSIS — J45909 Unspecified asthma, uncomplicated: Secondary | ICD-10-CM | POA: Diagnosis not present

## 2017-08-25 DIAGNOSIS — N189 Chronic kidney disease, unspecified: Secondary | ICD-10-CM | POA: Diagnosis not present

## 2017-08-31 DIAGNOSIS — E119 Type 2 diabetes mellitus without complications: Secondary | ICD-10-CM | POA: Diagnosis not present

## 2017-08-31 DIAGNOSIS — R6889 Other general symptoms and signs: Secondary | ICD-10-CM | POA: Diagnosis not present

## 2017-09-02 DIAGNOSIS — M170 Bilateral primary osteoarthritis of knee: Secondary | ICD-10-CM | POA: Diagnosis not present

## 2017-09-02 DIAGNOSIS — M79644 Pain in right finger(s): Secondary | ICD-10-CM | POA: Diagnosis not present

## 2017-09-02 DIAGNOSIS — R6889 Other general symptoms and signs: Secondary | ICD-10-CM | POA: Diagnosis not present

## 2017-09-03 DIAGNOSIS — Z0100 Encounter for examination of eyes and vision without abnormal findings: Secondary | ICD-10-CM | POA: Diagnosis not present

## 2017-09-07 DIAGNOSIS — M1811 Unilateral primary osteoarthritis of first carpometacarpal joint, right hand: Secondary | ICD-10-CM | POA: Diagnosis not present

## 2017-09-07 DIAGNOSIS — R6889 Other general symptoms and signs: Secondary | ICD-10-CM | POA: Diagnosis not present

## 2017-09-16 DIAGNOSIS — R6889 Other general symptoms and signs: Secondary | ICD-10-CM | POA: Diagnosis not present

## 2017-09-21 DIAGNOSIS — N390 Urinary tract infection, site not specified: Secondary | ICD-10-CM | POA: Diagnosis not present

## 2017-09-21 DIAGNOSIS — R6889 Other general symptoms and signs: Secondary | ICD-10-CM | POA: Diagnosis not present

## 2017-09-21 DIAGNOSIS — E1140 Type 2 diabetes mellitus with diabetic neuropathy, unspecified: Secondary | ICD-10-CM | POA: Diagnosis not present

## 2017-09-21 DIAGNOSIS — E119 Type 2 diabetes mellitus without complications: Secondary | ICD-10-CM | POA: Diagnosis not present

## 2017-09-21 DIAGNOSIS — N898 Other specified noninflammatory disorders of vagina: Secondary | ICD-10-CM | POA: Diagnosis not present

## 2017-09-21 DIAGNOSIS — J45909 Unspecified asthma, uncomplicated: Secondary | ICD-10-CM | POA: Diagnosis not present

## 2017-09-21 DIAGNOSIS — N189 Chronic kidney disease, unspecified: Secondary | ICD-10-CM | POA: Diagnosis not present

## 2017-09-21 DIAGNOSIS — E785 Hyperlipidemia, unspecified: Secondary | ICD-10-CM | POA: Diagnosis not present

## 2017-09-21 DIAGNOSIS — I10 Essential (primary) hypertension: Secondary | ICD-10-CM | POA: Diagnosis not present

## 2017-09-21 DIAGNOSIS — R319 Hematuria, unspecified: Secondary | ICD-10-CM | POA: Diagnosis not present

## 2017-09-29 DIAGNOSIS — R6889 Other general symptoms and signs: Secondary | ICD-10-CM | POA: Diagnosis not present

## 2017-10-08 DIAGNOSIS — J219 Acute bronchiolitis, unspecified: Secondary | ICD-10-CM | POA: Diagnosis not present

## 2017-10-08 DIAGNOSIS — G4733 Obstructive sleep apnea (adult) (pediatric): Secondary | ICD-10-CM | POA: Diagnosis not present

## 2017-10-08 DIAGNOSIS — J209 Acute bronchitis, unspecified: Secondary | ICD-10-CM | POA: Diagnosis not present

## 2017-10-10 ENCOUNTER — Encounter (HOSPITAL_COMMUNITY): Payer: Self-pay

## 2017-10-10 ENCOUNTER — Other Ambulatory Visit: Payer: Self-pay

## 2017-10-10 ENCOUNTER — Inpatient Hospital Stay (HOSPITAL_COMMUNITY)
Admission: EM | Admit: 2017-10-10 | Discharge: 2017-10-12 | DRG: 202 | Disposition: A | Discharge status: Home / Self Care | Payer: Medicare HMO | Attending: Internal Medicine | Admitting: Internal Medicine

## 2017-10-10 ENCOUNTER — Emergency Department (HOSPITAL_COMMUNITY): Payer: Medicare HMO

## 2017-10-10 DIAGNOSIS — R079 Chest pain, unspecified: Secondary | ICD-10-CM | POA: Diagnosis not present

## 2017-10-10 DIAGNOSIS — G4733 Obstructive sleep apnea (adult) (pediatric): Secondary | ICD-10-CM | POA: Diagnosis present

## 2017-10-10 DIAGNOSIS — E1121 Type 2 diabetes mellitus with diabetic nephropathy: Secondary | ICD-10-CM | POA: Diagnosis present

## 2017-10-10 DIAGNOSIS — T380X5A Adverse effect of glucocorticoids and synthetic analogues, initial encounter: Secondary | ICD-10-CM | POA: Diagnosis present

## 2017-10-10 DIAGNOSIS — Z794 Long term (current) use of insulin: Secondary | ICD-10-CM

## 2017-10-10 DIAGNOSIS — K219 Gastro-esophageal reflux disease without esophagitis: Secondary | ICD-10-CM | POA: Diagnosis present

## 2017-10-10 DIAGNOSIS — E1142 Type 2 diabetes mellitus with diabetic polyneuropathy: Secondary | ICD-10-CM | POA: Diagnosis present

## 2017-10-10 DIAGNOSIS — E860 Dehydration: Secondary | ICD-10-CM | POA: Diagnosis present

## 2017-10-10 DIAGNOSIS — R0789 Other chest pain: Secondary | ICD-10-CM | POA: Diagnosis not present

## 2017-10-10 DIAGNOSIS — M1990 Unspecified osteoarthritis, unspecified site: Secondary | ICD-10-CM | POA: Diagnosis present

## 2017-10-10 DIAGNOSIS — B9789 Other viral agents as the cause of diseases classified elsewhere: Secondary | ICD-10-CM | POA: Diagnosis present

## 2017-10-10 DIAGNOSIS — Z90710 Acquired absence of both cervix and uterus: Secondary | ICD-10-CM

## 2017-10-10 DIAGNOSIS — J441 Chronic obstructive pulmonary disease with (acute) exacerbation: Secondary | ICD-10-CM | POA: Diagnosis present

## 2017-10-10 DIAGNOSIS — R944 Abnormal results of kidney function studies: Secondary | ICD-10-CM | POA: Diagnosis present

## 2017-10-10 DIAGNOSIS — R0602 Shortness of breath: Secondary | ICD-10-CM | POA: Diagnosis present

## 2017-10-10 DIAGNOSIS — J209 Acute bronchitis, unspecified: Secondary | ICD-10-CM | POA: Diagnosis present

## 2017-10-10 DIAGNOSIS — J440 Chronic obstructive pulmonary disease with acute lower respiratory infection: Secondary | ICD-10-CM | POA: Diagnosis present

## 2017-10-10 DIAGNOSIS — J218 Acute bronchiolitis due to other specified organisms: Secondary | ICD-10-CM | POA: Diagnosis present

## 2017-10-10 DIAGNOSIS — E785 Hyperlipidemia, unspecified: Secondary | ICD-10-CM | POA: Diagnosis present

## 2017-10-10 DIAGNOSIS — R05 Cough: Secondary | ICD-10-CM | POA: Diagnosis not present

## 2017-10-10 DIAGNOSIS — E1165 Type 2 diabetes mellitus with hyperglycemia: Secondary | ICD-10-CM | POA: Diagnosis present

## 2017-10-10 DIAGNOSIS — I10 Essential (primary) hypertension: Secondary | ICD-10-CM | POA: Diagnosis present

## 2017-10-10 DIAGNOSIS — Z79899 Other long term (current) drug therapy: Secondary | ICD-10-CM | POA: Diagnosis not present

## 2017-10-10 DIAGNOSIS — Z7982 Long term (current) use of aspirin: Secondary | ICD-10-CM | POA: Diagnosis not present

## 2017-10-10 DIAGNOSIS — J219 Acute bronchiolitis, unspecified: Secondary | ICD-10-CM | POA: Diagnosis present

## 2017-10-10 DIAGNOSIS — R062 Wheezing: Secondary | ICD-10-CM | POA: Diagnosis not present

## 2017-10-10 LAB — BASIC METABOLIC PANEL
Anion gap: 14 (ref 5–15)
BUN: 25 mg/dL — ABNORMAL HIGH (ref 6–20)
CO2: 23 mmol/L (ref 22–32)
Calcium: 9.7 mg/dL (ref 8.9–10.3)
Chloride: 108 mmol/L (ref 101–111)
Creatinine, Ser: 0.83 mg/dL (ref 0.44–1.00)
GFR calc Af Amer: >60 mL/min (ref >60)
GFR calc non Af Amer: >60 mL/min (ref >60)
Glucose, Bld: 136 mg/dL — ABNORMAL HIGH (ref 65–99)
Potassium: 3.2 mmol/L — ABNORMAL LOW (ref 3.5–5.1)
Sodium: 145 mmol/L (ref 135–145)

## 2017-10-10 LAB — CBC
HCT: 33.2 % — ABNORMAL LOW (ref 36.0–46.0)
Hemoglobin: 9.8 g/dL — ABNORMAL LOW (ref 12.0–15.0)
MCH: 22.5 pg — ABNORMAL LOW (ref 26.0–34.0)
MCHC: 29.5 g/dL — ABNORMAL LOW (ref 30.0–36.0)
MCV: 76.1 fL — ABNORMAL LOW (ref 78.0–100.0)
Platelets: 88 10*3/uL — ABNORMAL LOW (ref 150–400)
RBC: 4.36 MIL/uL (ref 3.87–5.11)
RDW: 18.7 % — ABNORMAL HIGH (ref 11.5–15.5)
WBC: 6.3 10*3/uL (ref 4.0–10.5)

## 2017-10-10 LAB — I-STAT TROPONIN, ED: Troponin i, poc: 0 ng/mL (ref 0.00–0.08)

## 2017-10-10 LAB — GLUCOSE, CAPILLARY: Glucose-Capillary: 566 mg/dL (ref 65–99)

## 2017-10-10 MED ORDER — SODIUM CHLORIDE 0.9 % IV SOLN
250.0000 mL | INTRAVENOUS | Status: DC | PRN
  Administered 2017-10-11: 500 mL via INTRAVENOUS

## 2017-10-10 MED ORDER — PANTOPRAZOLE SODIUM 40 MG PO TBEC
40.0000 mg | DELAYED_RELEASE_TABLET | Freq: Every day | ORAL | Status: DC
  Administered 2017-10-10 – 2017-10-12 (×3): 40 mg via ORAL
  Filled 2017-10-10 (×3): qty 1

## 2017-10-10 MED ORDER — ASPIRIN 81 MG PO TABS: 81.0000 mg | ORAL_TABLET | Freq: Every day | ORAL | Status: DC

## 2017-10-10 MED ORDER — LOSARTAN POTASSIUM 50 MG PO TABS
25.0000 mg | ORAL_TABLET | Freq: Every day | ORAL | Status: DC
  Administered 2017-10-10: 50 mg via ORAL
  Administered 2017-10-11: 25 mg via ORAL
  Administered 2017-10-12: 0.5 mg via ORAL
  Filled 2017-10-10 (×3): qty 1

## 2017-10-10 MED ORDER — METHYLPREDNISOLONE SODIUM SUCC 125 MG IJ SOLR
125.0000 mg | Freq: Once | INTRAMUSCULAR | Status: AC
  Administered 2017-10-10: 125 mg via INTRAVENOUS
  Filled 2017-10-10: qty 2

## 2017-10-10 MED ORDER — ATORVASTATIN CALCIUM 10 MG PO TABS
10.0000 mg | ORAL_TABLET | Freq: Every day | ORAL | Status: DC
  Administered 2017-10-10 – 2017-10-11 (×2): 10 mg via ORAL
  Filled 2017-10-10 (×2): qty 1

## 2017-10-10 MED ORDER — POTASSIUM CHLORIDE CRYS ER 20 MEQ PO TBCR
40.0000 meq | EXTENDED_RELEASE_TABLET | Freq: Once | ORAL | Status: AC
  Administered 2017-10-11: 40 meq via ORAL
  Filled 2017-10-10: qty 2

## 2017-10-10 MED ORDER — TIZANIDINE HCL 4 MG PO TABS
4.0000 mg | ORAL_TABLET | Freq: Every day | ORAL | Status: DC
  Administered 2017-10-10 – 2017-10-12 (×3): 4 mg via ORAL
  Filled 2017-10-10 (×3): qty 1

## 2017-10-10 MED ORDER — ACETAMINOPHEN 325 MG PO TABS
650.0000 mg | ORAL_TABLET | Freq: Once | ORAL | Status: AC
  Administered 2017-10-10: 650 mg via ORAL
  Filled 2017-10-10: qty 2

## 2017-10-10 MED ORDER — PREGABALIN 100 MG PO CAPS
200.0000 mg | ORAL_CAPSULE | Freq: Three times a day (TID) | ORAL | Status: DC
  Administered 2017-10-10 – 2017-10-12 (×5): 200 mg via ORAL
  Filled 2017-10-10 (×5): qty 2

## 2017-10-10 MED ORDER — AMLODIPINE BESYLATE 5 MG PO TABS
5.0000 mg | ORAL_TABLET | Freq: Every day | ORAL | Status: DC
  Administered 2017-10-10 – 2017-10-12 (×3): 5 mg via ORAL
  Filled 2017-10-10 (×3): qty 1

## 2017-10-10 MED ORDER — MAGNESIUM SULFATE 2 GM/50ML IV SOLN
2.0000 g | Freq: Once | INTRAVENOUS | Status: AC
  Administered 2017-10-10: 2 g via INTRAVENOUS
  Filled 2017-10-10: qty 50

## 2017-10-10 MED ORDER — INSULIN ASPART 100 UNIT/ML ~~LOC~~ SOLN
15.0000 [IU] | Freq: Once | SUBCUTANEOUS | Status: AC
  Administered 2017-10-10: 15 [IU] via SUBCUTANEOUS

## 2017-10-10 MED ORDER — HEPARIN SODIUM (PORCINE) 5000 UNIT/ML IJ SOLN
5000.0000 [IU] | Freq: Three times a day (TID) | INTRAMUSCULAR | Status: DC
  Administered 2017-10-10 – 2017-10-11 (×2): 5000 [IU] via SUBCUTANEOUS
  Filled 2017-10-10 (×3): qty 1

## 2017-10-10 MED ORDER — SODIUM CHLORIDE 0.9 % IV SOLN
100.0000 mg | Freq: Two times a day (BID) | INTRAVENOUS | Status: DC
  Administered 2017-10-11: 100 mg via INTRAVENOUS
  Filled 2017-10-10 (×2): qty 100

## 2017-10-10 MED ORDER — INSULIN GLARGINE 100 UNIT/ML ~~LOC~~ SOLN
20.0000 [IU] | Freq: Every day | SUBCUTANEOUS | Status: DC
  Administered 2017-10-11 – 2017-10-12 (×2): 20 [IU] via SUBCUTANEOUS
  Filled 2017-10-10 (×2): qty 0.2

## 2017-10-10 MED ORDER — INSULIN ASPART 100 UNIT/ML ~~LOC~~ SOLN: 0.0000 [IU] | Freq: Every day | SUBCUTANEOUS | Status: DC

## 2017-10-10 MED ORDER — DM-GUAIFENESIN ER 30-600 MG PO TB12: 1.0000 | ORAL_TABLET | Freq: Two times a day (BID) | ORAL | Status: DC | PRN

## 2017-10-10 MED ORDER — ALBUTEROL SULFATE (2.5 MG/3ML) 0.083% IN NEBU
5.0000 mg | INHALATION_SOLUTION | Freq: Once | RESPIRATORY_TRACT | Status: AC
  Administered 2017-10-10: 5 mg via RESPIRATORY_TRACT
  Filled 2017-10-10: qty 6

## 2017-10-10 MED ORDER — HYDROCODONE-ACETAMINOPHEN 5-325 MG PO TABS
1.0000 | ORAL_TABLET | Freq: Four times a day (QID) | ORAL | Status: DC | PRN
  Administered 2017-10-10 – 2017-10-11 (×4): 2 via ORAL
  Filled 2017-10-10 (×4): qty 2

## 2017-10-10 MED ORDER — IPRATROPIUM-ALBUTEROL 0.5-2.5 (3) MG/3ML IN SOLN: 3.0000 mL | Freq: Two times a day (BID) | RESPIRATORY_TRACT | Status: DC

## 2017-10-10 MED ORDER — IPRATROPIUM-ALBUTEROL 0.5-2.5 (3) MG/3ML IN SOLN
3.0000 mL | Freq: Four times a day (QID) | RESPIRATORY_TRACT | Status: DC
  Administered 2017-10-10 – 2017-10-11 (×5): 3 mL via RESPIRATORY_TRACT
  Filled 2017-10-10 (×5): qty 3

## 2017-10-10 MED ORDER — CETIRIZINE HCL 10 MG PO TABS
10.0000 mg | ORAL_TABLET | Freq: Every day | ORAL | Status: DC
  Administered 2017-10-11 – 2017-10-12 (×2): 10 mg via ORAL
  Filled 2017-10-10 (×4): qty 1

## 2017-10-10 MED ORDER — ALBUTEROL (5 MG/ML) CONTINUOUS INHALATION SOLN
10.0000 mg/h | INHALATION_SOLUTION | Freq: Once | RESPIRATORY_TRACT | Status: AC
  Administered 2017-10-10: 10 mg/h via RESPIRATORY_TRACT

## 2017-10-10 MED ORDER — METHYLPREDNISOLONE SODIUM SUCC 40 MG IJ SOLR
40.0000 mg | Freq: Two times a day (BID) | INTRAMUSCULAR | Status: DC
  Administered 2017-10-10 – 2017-10-11 (×2): 40 mg via INTRAVENOUS
  Filled 2017-10-10 (×2): qty 1

## 2017-10-10 MED ORDER — ASPIRIN EC 81 MG PO TBEC
81.0000 mg | DELAYED_RELEASE_TABLET | Freq: Every day | ORAL | Status: DC
  Administered 2017-10-10 – 2017-10-12 (×3): 81 mg via ORAL
  Filled 2017-10-10 (×3): qty 1

## 2017-10-10 MED ORDER — SODIUM CHLORIDE 0.9% FLUSH
3.0000 mL | Freq: Two times a day (BID) | INTRAVENOUS | Status: DC
  Administered 2017-10-10 – 2017-10-11 (×3): 3 mL via INTRAVENOUS

## 2017-10-10 MED ORDER — ALBUTEROL (5 MG/ML) CONTINUOUS INHALATION SOLN
10.0000 mg/h | INHALATION_SOLUTION | Freq: Once | RESPIRATORY_TRACT | Status: AC
  Administered 2017-10-10: 10 mg/h via RESPIRATORY_TRACT
  Filled 2017-10-10: qty 20

## 2017-10-10 MED ORDER — ALBUTEROL SULFATE (2.5 MG/3ML) 0.083% IN NEBU: 2.5000 mg | INHALATION_SOLUTION | Freq: Four times a day (QID) | RESPIRATORY_TRACT | Status: DC

## 2017-10-10 MED ORDER — ALBUTEROL SULFATE (2.5 MG/3ML) 0.083% IN NEBU: 2.5000 mg | INHALATION_SOLUTION | RESPIRATORY_TRACT | Status: DC | PRN

## 2017-10-10 MED ORDER — SODIUM CHLORIDE 0.9% FLUSH: 3.0000 mL | INTRAVENOUS | Status: DC | PRN

## 2017-10-10 MED ORDER — INSULIN ASPART 100 UNIT/ML ~~LOC~~ SOLN
0.0000 [IU] | Freq: Three times a day (TID) | SUBCUTANEOUS | Status: DC
  Administered 2017-10-11: 5 [IU] via SUBCUTANEOUS
  Administered 2017-10-11: 15 [IU] via SUBCUTANEOUS
  Administered 2017-10-12: 5 [IU] via SUBCUTANEOUS
  Administered 2017-10-12: 8 [IU] via SUBCUTANEOUS

## 2017-10-10 NOTE — H&P (Signed)
History and Physical    [name] BSW:[id number] DOB: 19-Nov-1950 DOA: 10/10/2017  PCP: Benito Mccreedy, MD Patient coming from: home  I have personally briefly reviewed patient's old medical records in [place]  Chief Complaint: Shortness of breath  HPI: [name] is a 67 y.o. female with medical history significant of past medical history listed below.  Patient does not have formal diagnosis of COPD but has had bronchitis in the past.  Presents with several days of shortness of breath which has progressively gotten worse.  Nothing she is aware of makes it better.  Activity makes her shortness of breath worse.  She denies being around any sick contacts.  Denies any hemoptysis.  The problem started gradual and as mentioned above persisted as such patient presented to the ED for further evaluation recommendations  ED Course: Chest x-ray reports bronchitic changes, patient given Solu-Medrol and albuterol with improvement in respiratory condition.  We are subsequently consulted for further evaluation recommendations.  Review of Systems: As per HPI otherwise 10 point review of systems negative.    Past Medical History:  Diagnosis Date  . Arthritis   . Diabetes mellitus without complication ([place])   . GERD (gastroesophageal reflux disease)   . H/O hiatal hernia   . Headache(784.0)   . Hyperlipidemia   . Hypertension   . Neuromuscular disorder ([facility])    neuropathy feet   . Sleep apnea    recently diagnosed- wears cpap    Past Surgical History:  Procedure Laterality Date  . ABDOMINAL HYSTERECTOMY     partial  . BACK SURGERY    . CHOLECYSTECTOMY    . COLONOSCOPY     age 24- doesnt know where or who   . left shoulder     x 2 surgeries  . right shoulder     x2  . SHOULDER OPEN ROTATOR CUFF REPAIR Right 08/25/2012   Procedure: ROTATOR CUFF REPAIR SHOULDER OPEN WITH ACROMIOPLASTY;  Surgeon: Tobi Bastos, MD;  Location: [facility];  Service: Orthopedics;   Laterality: Right;  . TONSILLECTOMY       reports that she has never smoked. She has never used smokeless tobacco. She reports that she drinks alcohol. She reports that she does not use drugs.  No Known Allergies  Family History  Problem Relation Age of Onset  . Colon cancer Father   . Colon cancer Paternal Grandfather   . Colon cancer Maternal Grandfather   . Bone cancer Maternal Uncle   . Colon polyps Neg Hx   . Esophageal cancer Neg Hx   . Rectal cancer Neg Hx   . Stomach cancer Neg Hx     Prior to Admission medications   Medication Sig Start Date End Date Taking? Authorizing Provider  ACCU-CHEK AVIVA PLUS test strip USE BID FOR GLUCOSE TESTING 12/19/14  Yes [provider]  amLODipine (NORVASC) 5 MG tablet Take 5 mg by mouth daily.   Yes [provider]  aspirin 81 MG tablet Take 81 mg by mouth daily. 09/24/17  Yes [provider]  atorvastatin (LIPITOR) 10 MG tablet Take 10 mg by mouth daily at 6 PM.   Yes [provider]  B-D ULTRAFINE III SHORT PEN 31G X 8 MM MISC  09/27/14  Yes [provider]  CETIRIZINE HCL PO Take 10 mg by mouth daily.   Yes [provider]  etodolac (LODINE) 300 MG capsule Take 300 mg by mouth daily. 10/08/17  Yes [provider]  furosemide (LASIX) 20 MG tablet Take 20 mg by mouth daily.  09/13/15  Yes [provider]  glimepiride (AMARYL) 2 MG tablet Take 2 mg by mouth 2 (two) times daily.    Yes [provider]  insulin glargine (LANTUS) 100 UNIT/ML injection Inject 20 Units into the skin daily. In the morning    Yes [provider]  ipratropium-albuterol (DUONEB) 0.5-2.5 (3) MG/3ML SOLN Take 3 mLs by nebulization 2 (two) times daily. 10/23/15  Yes Barton Dubois, MD  JARDIANCE 25 MG TABS tablet Take 25 mg by mouth daily. 09/21/17  Yes [provider]  losartan (COZAAR) 25 MG tablet Take 1 tablet (25 mg total) by mouth daily. 10/23/15  Yes Barton Dubois, MD    LYRICA 200 MG capsule Take 200 mg by mouth 3 (three) times daily. 10/08/17  Yes [provider]  metFORMIN (GLUCOPHAGE) 1000 MG tablet Take 1,000 mg by mouth 2 (two) times daily. 07/19/17  Yes [provider]  omeprazole (PRILOSEC) 20 MG capsule Take 20 mg by mouth daily.  10/05/14  Yes [provider]  tiZANidine (ZANAFLEX) 4 MG tablet Take 4 mg by mouth daily.  10/17/14  Yes [provider]  VENTOLIN HFA 108 (90 Base) MCG/ACT inhaler Inhale 2 puffs into the lungs every 4 (four) hours as needed.  10/01/15  Yes [provider]  vitamin E 400 UNIT capsule Apply 400 Units topically daily. Breaks open capsule and rubs on oil 08/26/17  Yes [provider]  ibuprofen (ADVIL,MOTRIN) 800 MG tablet Take 800 mg by mouth daily as needed. 08/25/17   [provider]    Physical Exam: Vitals:   10/10/17 1400 10/10/17 1415 10/10/17 1430 10/10/17 1636  BP:   (!) 169/78 (!) 181/64  Pulse: (!) 112 (!) 110 (!) 111 (!) 111  Resp: (!) 21 (!) 22 (!) 23 20  Temp:      TempSrc:      SpO2: 100% 96% 98% 96%  Weight:      Height:        Constitutional: NAD, calm, comfortable Vitals:   10/10/17 1400 10/10/17 1415 10/10/17 1430 10/10/17 1636  BP:   (!) 169/78 (!) 181/64  Pulse: (!) 112 (!) 110 (!) 111 (!) 111  Resp: (!) 21 (!) 22 (!) 23 20  Temp:      TempSrc:      SpO2: 100% 96% 98% 96%  Weight:      Height:       Eyes: PERRL, lids and conjunctivae normal ENMT: Mucous membranes are moist. Posterior pharynx clear of any exudate or lesions.Normal dentition.  Neck: normal, supple, no masses, no thyromegaly Respiratory: Patient has equal chest rise, poor inspiratory effort, chest sounds tight, no wheezes Cardiovascular: Regular rate and rhythm, no murmurs / rubs / gallops.  Abdomen: no tenderness, no masses palpated. No hepatosplenomegaly. Bowel sounds positive.  Musculoskeletal: no clubbing / cyanosis. No joint deformity upper and lower extremities.   Skin: no rashes, lesions, ulcers. No induration Neurologic: Answers questions appropriately, moves extremities equally, hearing intact, no facial asymmetry sensation intact, Strength 5/5 in all 4.  Psychiatric: Normal judgment and insight. Alert and oriented x 3. Normal mood.    Labs on Admission: I have personally reviewed following labs and imaging studies  CBC: Recent Labs  Lab 10/10/17 1212  WBC 6.3  HGB 9.8*  HCT 33.2*  MCV 76.1*  PLT 88*   Basic Metabolic Panel: Recent Labs  Lab 10/10/17 1212  NA 145  K  3.2*  CL 108  CO2 23  GLUCOSE 136*  BUN 25*  CREATININE 0.83  CALCIUM 9.7   GFR: Estimated Creatinine Clearance: 74.7 mL/min (by C-G formula based on SCr of 0.83 mg/dL). Liver Function Tests: No results for input(s): AST, ALT, ALKPHOS, BILITOT, PROT, ALBUMIN in the last 168 hours. No results for input(s): LIPASE, AMYLASE in the last 168 hours. No results for input(s): AMMONIA in the last 168 hours. Coagulation Profile: No results for input(s): INR, PROTIME in the last 168 hours. Cardiac Enzymes: No results for input(s): CKTOTAL, CKMB, CKMBINDEX, TROPONINI in the last 168 hours. BNP (last 3 results) No results for input(s): PROBNP in the last 8760 hours. HbA1C: No results for input(s): HGBA1C in the last 72 hours. CBG: No results for input(s): GLUCAP in the last 168 hours. Lipid Profile: No results for input(s): CHOL, HDL, LDLCALC, TRIG, CHOLHDL, LDLDIRECT in the last 72 hours. Thyroid Function Tests: No results for input(s): TSH, T4TOTAL, FREET4, T3FREE, THYROIDAB in the last 72 hours. Anemia Panel: No results for input(s): VITAMINB12, FOLATE, FERRITIN, TIBC, IRON, RETICCTPCT in the last 72 hours. Urine analysis:    Component Value Date/Time   COLORURINE YELLOW 10/21/2015 1737   APPEARANCEUR CLEAR 10/21/2015 1737   LABSPEC 1.015 10/21/2015 1737   PHURINE 5.5 10/21/2015 1737   GLUCOSEU >1000 (A) 10/21/2015 1737   HGBUR NEGATIVE 10/21/2015 1737    BILIRUBINUR NEGATIVE 10/21/2015 1737   KETONESUR 15 (A) 10/21/2015 1737   PROTEINUR NEGATIVE 10/21/2015 1737   UROBILINOGEN 0.2 08/18/2012 1110   NITRITE NEGATIVE 10/21/2015 1737   LEUKOCYTESUR NEGATIVE 10/21/2015 1737    Radiological Exams on Admission: Dg Chest 2 View  Result Date: 10/10/2017 CLINICAL DATA:  Chest pain.  Shortness of breath.  Productive cough. EXAM: CHEST - 2 VIEW COMPARISON:  Oct 21, 2015 FINDINGS: No pneumothorax. Chronic interstitial prominence in the bases suggestive of bronchitic change. No nodules or masses. No new focal infiltrates. The cardiomediastinal silhouette is stable. IMPRESSION: Chronic interstitial changes in the bases, likely bronchitic change. No acute infiltrate. Electronically Signed   By: Dorise Bullion III M.D   On: 10/10/2017 10:33    EKG: Independently reviewed.  Sinus rhythm with no ST elevations or depressions  Assessment/Plan Active Problems:   Acute bronchiolitis -Continue albuterol, will add doxycycline, and Solu-Medrol 40 mg IV twice daily -Chest x-ray reports no acute infiltrate.  Doxycycline is for suspected COPD    SOB (shortness of breath) -Secondary to principal problem listed above.  With improvement in condition will recommend pulmonary function testing as outpatient to see if patient has formal diagnosis of any lung disease.    Benign essential HTN -Continue prior to admission medication regimen  Dehydration  -Patient has a elevated BUN/creatinine ratio 20 to 1.  As such we will hold home Lasix regimen.  Unclear why patient is on Lasix at home but at this juncture given dehydration we will hold Lasix    GERD (gastroesophageal reflux disease) -Stable we will continue PPI    Diabetes mellitus with nephropathy ([place]) -For diabetes we will treat with long-acting insulin, SSI, diabetic diet  Neuropathic pain most likely secondary to diabetes mellitus -Treat with gabapentin.  DVT prophylaxis: heparin Code Status: full Family  Communication: d/c patient and family at bedside. Disposition Plan: MedSurg Consults called: None Admission status: Observation   Velvet Bathe MD [facility] Pager [phone]  If 7PM-7AM, please contact night-coverage [email or web address] Password [facility]  10/10/2017, 4:40 PM

## 2017-10-10 NOTE — ED Provider Notes (Signed)
Medical screening examination/treatment/procedure(s) were conducted as a shared visit with non-physician practitioner(s) and myself.  I personally evaluated the patient during the encounter.  EKG Interpretation  Date/Time:  Saturday October 10 2017 11:21:19 EDT Ventricular Rate:  85 PR Interval:    QRS Duration: 79 QT Interval:  364 QTC Calculation: 433 R Axis:   23 Text Interpretation:  Sinus rhythm Probable LVH with secondary repol abnrm No significant change since last tracing Confirmed by Lacretia Leigh ([id number]) on 10/10/2017 1:16:08 PM  Patient complaining of cough congestion with dyspnea on exertion.  Had wheezing on exam initially.  Given albuterol, magnesium, Solu-Medrol.  Continues with wheezing and will be admitted to the hospital   Lacretia Leigh, MD 10/10/17 1402

## 2017-10-10 NOTE — ED Notes (Signed)
Respiratory called for breathing tx

## 2017-10-10 NOTE — ED Triage Notes (Signed)
PT C/O COUGH WITH YELLOW SPUTUM, CHEST CONGESTION, AND SOB WITH EXERTION. PT DENIES FEVER, BUT STS SHE WOKE UP SWEATING THIS MORNING. PT HAS BEEN USING THE NEBULIZER AND INHALER W/O RELIEF.

## 2017-10-10 NOTE — ED Provider Notes (Signed)
[facility] DEPT Provider Note   CSN: [id number] Arrival date & time: 10/10/17  0827     History   Chief Complaint Chief Complaint  [name] presents with  . Cough    HPI [name] is a 67 y.o. female.  HPI  [name] is a 67 year old female with a history of chronic bronchitis, hypertension, hyperlipidemia, type 2 diabetes, OSA who presents to the emergency department for evaluation of wheezing, cough and chest soreness.  [name] reports that [name] cough began 3 days ago and is productive of yellow sputum.  [name] also reports that [name] has had persistent wheezing.  Has used [name] Symbicort inhaler daily and is also using albuterol nebulizer twice daily without improvement in wheeze.  [name] states that [name] feels short of breath at rest and particularly with walking or moving.  [name] also endorses central chest soreness, which [name] states is worse with cough but present constantly.  Reports that it has been present for 3 days now and has not gone away.  [name] denies radiation of the chest pain.  Reports that this morning [name] felt hot and sweaty, although denies fever.  [name] denies history of ischemic heart disease, tobacco use or exertional chest pain.  Denies history of DVT/PE, pleuritic chest pain, leg swelling/calf tenderness, recent surgery or immobilization, exogenous estrogen.  Denies orthopnea, PND, back pain, sore throat, congestion, headache, abdominal pain, nausea/vomiting, diarrhea, lightheadedness, numbness, weakness.  Reports that [name] has not taken [name] blood pressure medication today.  Past Medical History:  Diagnosis Date  . Arthritis   . Diabetes mellitus without complication ([place])   . GERD (gastroesophageal reflux disease)   . H/O hiatal hernia   . Headache(784.0)   . Hyperlipidemia   . Hypertension   . Neuromuscular disorder ([facility])    neuropathy feet   . Sleep apnea    recently diagnosed- wears cpap    [name] Active Problem List   Diagnosis Date Noted  . Shortness of breath 10/21/2015  . Acute bronchitis and bronchiolitis 10/21/2015  . Lactic acidosis 10/21/2015  . Benign essential HTN 10/21/2015  . GERD (gastroesophageal reflux disease) 10/21/2015  . Diabetes mellitus with nephropathy ([place]) 10/21/2015  . Acute renal failure superimposed on stage 3 chronic kidney disease ([place]) 10/21/2015  . Chronic arthritis 10/21/2015  . SOB (shortness of breath) 10/21/2015  . OSA (obstructive sleep apnea) 10/11/2012  . Rotator cuff tear, non-traumatic 08/25/2012    Past Surgical History:  Procedure Laterality Date  . ABDOMINAL HYSTERECTOMY     partial  . BACK SURGERY    . CHOLECYSTECTOMY    . COLONOSCOPY     age 34- doesnt know where or who   . left shoulder     x 2 surgeries  . right shoulder     x2  . SHOULDER OPEN ROTATOR CUFF REPAIR Right 08/25/2012   Procedure: ROTATOR CUFF REPAIR SHOULDER OPEN WITH ACROMIOPLASTY;  Surgeon: Tobi Bastos, MD;  Location: [facility];  Service: Orthopedics;  Laterality: Right;  . TONSILLECTOMY       OB History   None      Home Medications    Prior to Admission medications   Medication Sig Start Date End Date Taking? Authorizing Provider  ACCU-CHEK AVIVA PLUS test strip USE BID FOR GLUCOSE TESTING 12/19/14   [provider]  acetic acid (VOSOL) 2 % otic solution 4 drops 3 (three) times daily.    [provider]  amLODipine (NORVASC) 5 MG tablet Take 5  mg by mouth daily.    [provider]  atorvastatin (LIPITOR) 10 MG tablet Take 10 mg by mouth daily at 6 PM.    [provider]  B-D ULTRAFINE III SHORT PEN 31G X 8 MM MISC  09/27/14   [provider]  CETIRIZINE HCL PO Take 10 mg by mouth daily.    [provider]  diclofenac sodium (VOLTAREN) 1 % GEL Apply 4 g topically 4 (four) times daily. Please instruct in dosing. 04/27/15   Billy Fischer, MD  furosemide (LASIX) 20 MG tablet Take 1 tablet by mouth daily. 09/13/15    [provider]  gabapentin (NEURONTIN) 100 MG capsule Take 1 capsule (100 mg total) by mouth at bedtime. [name] not taking: Reported on 03/31/2017 10/25/14   Trula Slade, DPM  glimepiride (AMARYL) 2 MG tablet Take 4 mg by mouth daily before breakfast.     [provider]  insulin glargine (LANTUS) 100 UNIT/ML injection Inject 10 Units into the skin daily. In the morning    [provider]  ipratropium-albuterol (DUONEB) 0.5-2.5 (3) MG/3ML SOLN Take 3 mLs by nebulization 2 (two) times daily. 10/23/15   Barton Dubois, MD  Liraglutide (VICTOZA) 18 MG/3ML SOLN injection Inject 1.8 mg into the skin daily after breakfast.     [provider]  losartan (COZAAR) 25 MG tablet Take 1 tablet (25 mg total) by mouth daily. 10/23/15   Barton Dubois, MD  omeprazole (PRILOSEC) 20 MG capsule  10/05/14   [provider]  potassium chloride (K-DUR) 10 MEQ tablet Take 1 tablet by mouth daily. 09/12/15   [provider]  pregabalin (LYRICA) 150 MG capsule Take 1 capsule (150 mg total) by mouth 2 (two) times daily. [name] taking differently: Take 150 mg by mouth 3 (three) times daily.  12/29/14   Trula Slade, DPM  PREMARIN vaginal cream APPLY 1/2 GRAM VAGINALLY 3 TIMES PER WEEK EVERY NIGHT AT BEDTIME. 01/03/15   Joylene John D, NP  tiZANidine (ZANAFLEX) 4 MG tablet  10/17/14   [provider]  VENTOLIN HFA 108 (90 Base) MCG/ACT inhaler Inhale 2 puffs into the lungs 3 (three) times daily. 10/01/15   [provider]    Family History Family History  Problem Relation Age of Onset  . Colon cancer Father   . Colon cancer Paternal Grandfather   . Colon cancer Maternal Grandfather   . Bone cancer Maternal Uncle   . Colon polyps Neg Hx   . Esophageal cancer Neg Hx   . Rectal cancer Neg Hx   . Stomach cancer Neg Hx     Social History Social History   Tobacco Use  . Smoking status: Never Smoker  . Smokeless tobacco: Never Used    Substance Use Topics  . Alcohol use: Yes    Alcohol/week: 0.0 oz    Comment: rarely  . Drug use: No     Allergies   [name] has no known allergies.   Review of Systems Review of Systems  Constitutional: Negative for chills and fever.  HENT: Negative for congestion, rhinorrhea and sore throat.   Eyes: Negative for visual disturbance.  Respiratory: Positive for cough, chest tightness, shortness of breath and wheezing.   Cardiovascular: Positive for chest pain. Negative for leg swelling.  Gastrointestinal: Negative for abdominal pain, nausea and vomiting.  Genitourinary: Negative for dysuria and frequency.  Musculoskeletal: Negative for back pain and gait problem.  Skin: Negative for rash.  Neurological: Negative for dizziness, weakness, light-headedness  and numbness.  Psychiatric/Behavioral: Negative for agitation.     Physical Exam Updated Vital Signs BP (!) 170/80 (BP Location: Right Arm)   Pulse 86   Temp 98.4 F (36.9 C) (Oral)   Resp (!) 24   Ht 5\' 4"  (1.626 m)   Wt 90.7 kg (200 lb)   SpO2 98%   BMI 34.33 kg/m   Physical Exam  Constitutional: [name] appears well-developed and well-nourished. No distress.  HENT:  Head: Normocephalic and atraumatic.  Mouth/Throat: Oropharynx is clear and moist. No oropharyngeal exudate.  Eyes: Pupils are equal, round, and reactive to light. Conjunctivae are normal. Right eye exhibits no discharge. Left eye exhibits no discharge.  Neck: Normal range of motion. Neck supple. No JVD present. No tracheal deviation present.  Cardiovascular: Normal rate, regular rhythm and intact distal pulses. Exam reveals no friction rub.  No murmur heard. Pulmonary/Chest: Effort normal. No respiratory distress.  Tender to palpation over anterior chest wall, no overlying rash or bruising. Audible wheezing heard throughout [name]'s speech.  Taking shallow breaths.  [name] has expiratory wheezing heard in all lung fields.  No crackles, stridor or  rhonchi.  Abdominal: Soft. Bowel sounds are normal. There is no tenderness. There is no guarding.  Musculoskeletal:  No leg swelling.  Neurological: [name] is alert. Coordination normal.  Skin: Skin is warm and dry. Capillary refill takes less than 2 seconds. [name] is not diaphoretic.  Psychiatric: [name] has a normal mood and affect. [name] behavior is normal.  Nursing note and vitals reviewed.    ED Treatments / Results  Labs (all labs ordered are listed, but only abnormal results are displayed) Labs Reviewed  CBC - Abnormal; Notable for the following components:      Result Value   Hemoglobin 9.8 (*)    HCT 33.2 (*)    MCV 76.1 (*)    MCH 22.5 (*)    MCHC 29.5 (*)    RDW 18.7 (*)    Platelets 88 (*)    All other components within normal limits  BASIC METABOLIC PANEL - Abnormal; Notable for the following components:   Potassium 3.2 (*)    Glucose, Bld 136 (*)    BUN 25 (*)    All other components within normal limits  I-STAT TROPONIN, ED    EKG EKG Interpretation  Date/Time:  Saturday October 10 2017 11:21:19 EDT Ventricular Rate:  85 PR Interval:    QRS Duration: 79 QT Interval:  364 QTC Calculation: 433 R Axis:   23 Text Interpretation:  Sinus rhythm Probable LVH with secondary repol abnrm No significant change since last tracing Confirmed by Lacretia Leigh ([id number]) on 10/10/2017 1:16:08 PM   Radiology Dg Chest 2 View  Result Date: 10/10/2017 CLINICAL DATA:  Chest pain.  Shortness of breath.  Productive cough. EXAM: CHEST - 2 VIEW COMPARISON:  Oct 21, 2015 FINDINGS: No pneumothorax. Chronic interstitial prominence in the bases suggestive of bronchitic change. No nodules or masses. No new focal infiltrates. The cardiomediastinal silhouette is stable. IMPRESSION: Chronic interstitial changes in the bases, likely bronchitic change. No acute infiltrate. Electronically Signed   By: Dorise Bullion III M.D   On: 10/10/2017 10:33    Procedures Procedures (including critical care  time)  Medications Ordered in ED Medications  albuterol (PROVENTIL,VENTOLIN) solution continuous neb (has no administration in time range)  albuterol (PROVENTIL) (2.5 MG/3ML) 0.083% nebulizer solution 5 mg (5 mg Nebulization Given 10/10/17 0936)  albuterol (PROVENTIL,VENTOLIN) solution continuous neb (10 mg/hr Nebulization Given 10/10/17 1152)  methylPREDNISolone sodium succinate (SOLU-MEDROL) 125 mg/2 mL injection 125 mg (125 mg Intravenous Given 10/10/17 1209)  magnesium sulfate IVPB 2 g 50 mL (2 g Intravenous New Bag/Given 10/10/17 1210)     Initial Impression / Assessment and Plan / ED Course  I have reviewed the triage vital signs and the nursing notes.  Pertinent labs & imaging results that were available during my care of the [name] were reviewed by me and considered in my medical decision making (see chart for details).    Presents to the emergency department for evaluation of wheezing, cough, shortness of breath and anterior chest pain.  [name] is audibly wheezing upon entering the room.  Has a history of chronic bronchitis for which [name] has albuterol nebulizer and Symbicort at home. Reports using these medications at home without relief.   Chest x-ray reveals chronic interstitial changes at the bases, likely bronchitis. [name] given IV solumedrol, magnesium and placed on continuous nebulizer. Plan to recheck, awaiting lab work. Do not suspect pulmonary embolism given no pleuritic pain, history of DVT/PE, exogenous estrogen, calf pain/leg swelling or recent surgery or immobilization.  [name] was not tachycardic on initial presentation, although pulse became 110 after continuous nebulizer, suspect this is related to [name] breathing treatment. [name] also reports anterior chest wall pain which has been constant for 3 days now.    Low concern for ACS given EKG nonischemic, troponin negative and [name] reports constant symptoms for several days now. No history of CHF or evidence of fluid overload  on exam.  On recheck, [name] continues to wheeze after receiving continuous nebulizer treatment, Solu-Medrol and magnesium.  Pulse ox 99% on room air, although given [name]'s work of breathing [name] was placed on 2 L nasal cannula.  Discussed this [name] with Dr. Zenia Resides who also saw the [name] and agrees with plan to admit for likely COPD exacerbation.  Discussed this [name] with hospitalist Dr. Wendee Beavers who will admit [name] to medicine.  [name] informed.  Final Clinical Impressions(s) / ED Diagnoses   Final diagnoses:  None    ED Discharge Orders    None       Glyn Ade, PA-C 10/10/17 1649

## 2017-10-10 NOTE — ED Notes (Signed)
ED TO INPATIENT HANDOFF REPORT  Name/Age/Gender [name] 67 y.o. female  Code Status Code Status History    Date Active Date Inactive Code Status Order ID Comments User Context   10/21/2015 1619 10/23/2015 1806 Full Code [id number]  Barton Dubois, MD Inpatient   08/25/2012 1656 08/26/2012 1610 Full Code [id number]  Tobi Bastos, MD Inpatient      Home/SNF/Other Home  Chief Complaint abd pain   Level of Care/Admitting Diagnosis ED Disposition    ED Disposition Condition [facility] Area: [facility] [[id number]]  Level of Care: Med-Surg [16]  Diagnosis: Acute bronchiolitis [732202]  Admitting Physician: Velvet Bathe [[id number]]  Attending Physician: Velvet Bathe [[id number]]  PT Class (Do Not Modify): Observation [104]  PT Acc Code (Do Not Modify): Observation [10022]       Medical History Past Medical History:  Diagnosis Date  . Arthritis   . Diabetes mellitus without complication ([place])   . GERD (gastroesophageal reflux disease)   . H/O hiatal hernia   . Headache(784.0)   . Hyperlipidemia   . Hypertension   . Neuromuscular disorder (HCC)    neuropathy feet   . Sleep apnea    recently diagnosed- wears cpap    Allergies No Known Allergies  IV Location/Drains/Wounds Patient Lines/Drains/Airways Status   Active Line/Drains/Airways    Name:   Placement date:   Placement time:   Site:   Days:   Peripheral IV 10/10/17 Left Antecubital   10/10/17    1216    Antecubital   less than 1          Labs/Imaging Results for orders placed or performed during the hospital encounter of 10/10/17 (from the past 48 hour(s))  CBC     Status: Abnormal   Collection Time: 10/10/17 12:12 PM  Result Value Ref Range   WBC 6.3 4.0 - 10.5 K/uL   RBC 4.36 3.87 - 5.11 MIL/uL   Hemoglobin 9.8 (L) 12.0 - 15.0 g/dL   HCT 33.2 (L) 36.0 - 46.0 %   MCV 76.1 (L) 78.0 - 100.0 fL   MCH 22.5 (L) 26.0 - 34.0 pg   MCHC 29.5 (L) 30.0 - 36.0 g/dL   RDW 18.7  (H) 11.5 - 15.5 %   Platelets 88 (L) 150 - 400 K/uL    Comment: RESULT REPEATED AND VERIFIED SPECIMEN CHECKED FOR CLOTS CONSISTENT WITH PREVIOUS RESULT Performed at [facility] [address], [place], [place] [address]   Basic metabolic panel     Status: Abnormal   Collection Time: 10/10/17 12:12 PM  Result Value Ref Range   Sodium 145 135 - 145 mmol/L   Potassium 3.2 (L) 3.5 - 5.1 mmol/L   Chloride 108 101 - 111 mmol/L   CO2 23 22 - 32 mmol/L   Glucose, Bld 136 (H) 65 - 99 mg/dL   BUN 25 (H) 6 - 20 mg/dL   Creatinine, Ser 0.83 0.44 - 1.00 mg/dL   Calcium 9.7 8.9 - 10.3 mg/dL   GFR calc non Af Amer >60 >60 mL/min   GFR calc Af Amer >60 >60 mL/min    Comment: (NOTE) The eGFR has been calculated using the CKD EPI equation. This calculation has not been validated in all clinical situations. eGFR's persistently <60 mL/min signify possible Chronic Kidney Disease.    Anion gap 14 5 - 15    Comment: Performed at [facility], [place] [address], [place], [place] [address]  I-Stat  Troponin, ED (not at [facility])     Status: None   Collection Time: 10/10/17 12:18 PM  Result Value Ref Range   Troponin i, poc 0.00 0.00 - 0.08 ng/mL   Comment 3            Comment: Due to the release kinetics of cTnI, a negative result within the first hours of the onset of symptoms does not rule out myocardial infarction with certainty. If myocardial infarction is still suspected, repeat the test at appropriate intervals.    Dg Chest 2 View  Result Date: 10/10/2017 CLINICAL DATA:  Chest pain.  Shortness of breath.  Productive cough. EXAM: CHEST - 2 VIEW COMPARISON:  Oct 21, 2015 FINDINGS: No pneumothorax. Chronic interstitial prominence in the bases suggestive of bronchitic change. No nodules or masses. No new focal infiltrates. The cardiomediastinal silhouette is stable. IMPRESSION: Chronic interstitial changes in the bases, likely bronchitic change. No acute infiltrate.  Electronically Signed   By: Dorise Bullion III M.D   On: 10/10/2017 10:33    Pending Labs Unresulted Labs (From admission, onward)   Start     Ordered   10/10/17 1651  Respiratory Panel by PCR  (Respiratory virus panel)  Once,   R     10/10/17 1650   Signed and Held  HIV antibody (Routine Testing)  Once,   R     Signed and Held   Signed and Held  CBC  (heparin)  Once,   R    Comments:  Baseline for heparin therapy IF NOT ALREADY DRAWN.  Notify MD if PLT < 100 K.    Signed and Held   Signed and Held  Creatinine, serum  (heparin)  Once,   R    Comments:  Baseline for heparin therapy IF NOT ALREADY DRAWN.    Signed and Held   Signed and Held  Basic metabolic panel  Tomorrow morning,   R     Signed and Held   Signed and Held  CBC  Tomorrow morning,   R     Signed and Held      Vitals/Pain Today's Vitals   10/10/17 1400 10/10/17 1415 10/10/17 1430 10/10/17 1636  BP:   (!) 169/78 (!) 181/64  Pulse: (!) 112 (!) 110 (!) 111 (!) 111  Resp: (!) 21 (!) 22 (!) 23 20  Temp:      TempSrc:      SpO2: 100% 96% 98% 96%  Weight:      Height:      PainSc:        Isolation Precautions Droplet precaution  Medications Medications  potassium chloride SA (K-DUR,KLOR-CON) CR tablet 40 mEq (has no administration in time range)  albuterol (PROVENTIL) (2.5 MG/3ML) 0.083% nebulizer solution 2.5 mg (has no administration in time range)  albuterol (PROVENTIL) (2.5 MG/3ML) 0.083% nebulizer solution 2.5 mg (has no administration in time range)  doxycycline (VIBRAMYCIN) 100 mg in sodium chloride 0.9 % 250 mL IVPB (has no administration in time range)  methylPREDNISolone sodium succinate (SOLU-MEDROL) 40 mg/mL injection 40 mg (has no administration in time range)  albuterol (PROVENTIL) (2.5 MG/3ML) 0.083% nebulizer solution 5 mg (5 mg Nebulization Given 10/10/17 0936)  albuterol (PROVENTIL,VENTOLIN) solution continuous neb (10 mg/hr Nebulization Given 10/10/17 1152)  methylPREDNISolone sodium  succinate (SOLU-MEDROL) 125 mg/2 mL injection 125 mg (125 mg Intravenous Given 10/10/17 1209)  magnesium sulfate IVPB 2 g 50 mL (0 g Intravenous Stopped 10/10/17 1310)  albuterol (PROVENTIL,VENTOLIN) solution continuous neb (10 mg/hr Nebulization Given  10/10/17 1503)  acetaminophen (TYLENOL) tablet 650 mg (650 mg Oral Given 10/10/17 1556)    Mobility walks

## 2017-10-11 ENCOUNTER — Other Ambulatory Visit: Payer: Self-pay

## 2017-10-11 DIAGNOSIS — J218 Acute bronchiolitis due to other specified organisms: Principal | ICD-10-CM

## 2017-10-11 DIAGNOSIS — Z794 Long term (current) use of insulin: Secondary | ICD-10-CM | POA: Diagnosis not present

## 2017-10-11 DIAGNOSIS — I10 Essential (primary) hypertension: Secondary | ICD-10-CM | POA: Diagnosis present

## 2017-10-11 DIAGNOSIS — T380X5A Adverse effect of glucocorticoids and synthetic analogues, initial encounter: Secondary | ICD-10-CM | POA: Diagnosis present

## 2017-10-11 DIAGNOSIS — E1142 Type 2 diabetes mellitus with diabetic polyneuropathy: Secondary | ICD-10-CM | POA: Diagnosis present

## 2017-10-11 DIAGNOSIS — B9789 Other viral agents as the cause of diseases classified elsewhere: Secondary | ICD-10-CM | POA: Diagnosis present

## 2017-10-11 DIAGNOSIS — E785 Hyperlipidemia, unspecified: Secondary | ICD-10-CM | POA: Diagnosis present

## 2017-10-11 DIAGNOSIS — J209 Acute bronchitis, unspecified: Secondary | ICD-10-CM | POA: Diagnosis present

## 2017-10-11 DIAGNOSIS — Z7982 Long term (current) use of aspirin: Secondary | ICD-10-CM | POA: Diagnosis not present

## 2017-10-11 DIAGNOSIS — M1990 Unspecified osteoarthritis, unspecified site: Secondary | ICD-10-CM | POA: Diagnosis present

## 2017-10-11 DIAGNOSIS — E1121 Type 2 diabetes mellitus with diabetic nephropathy: Secondary | ICD-10-CM | POA: Diagnosis present

## 2017-10-11 DIAGNOSIS — K219 Gastro-esophageal reflux disease without esophagitis: Secondary | ICD-10-CM | POA: Diagnosis present

## 2017-10-11 DIAGNOSIS — E860 Dehydration: Secondary | ICD-10-CM | POA: Diagnosis present

## 2017-10-11 DIAGNOSIS — R944 Abnormal results of kidney function studies: Secondary | ICD-10-CM | POA: Diagnosis present

## 2017-10-11 DIAGNOSIS — R0602 Shortness of breath: Secondary | ICD-10-CM | POA: Diagnosis present

## 2017-10-11 DIAGNOSIS — J440 Chronic obstructive pulmonary disease with acute lower respiratory infection: Secondary | ICD-10-CM | POA: Diagnosis present

## 2017-10-11 DIAGNOSIS — J441 Chronic obstructive pulmonary disease with (acute) exacerbation: Secondary | ICD-10-CM | POA: Diagnosis present

## 2017-10-11 DIAGNOSIS — G4733 Obstructive sleep apnea (adult) (pediatric): Secondary | ICD-10-CM | POA: Diagnosis present

## 2017-10-11 DIAGNOSIS — E1165 Type 2 diabetes mellitus with hyperglycemia: Secondary | ICD-10-CM | POA: Diagnosis present

## 2017-10-11 DIAGNOSIS — Z79899 Other long term (current) drug therapy: Secondary | ICD-10-CM | POA: Diagnosis not present

## 2017-10-11 DIAGNOSIS — Z90710 Acquired absence of both cervix and uterus: Secondary | ICD-10-CM | POA: Diagnosis not present

## 2017-10-11 LAB — RESPIRATORY PANEL BY PCR

## 2017-10-11 LAB — GLUCOSE, CAPILLARY
Glucose-Capillary: 461 mg/dL — ABNORMAL HIGH (ref 65–99)
Glucose-Capillary: 345 mg/dL — ABNORMAL HIGH (ref 65–99)
Glucose-Capillary: 228 mg/dL — ABNORMAL HIGH (ref 65–99)
Glucose-Capillary: 368 mg/dL — ABNORMAL HIGH (ref 65–99)
Glucose-Capillary: 436 mg/dL — ABNORMAL HIGH (ref 65–99)
Glucose-Capillary: 416 mg/dL — ABNORMAL HIGH (ref 65–99)

## 2017-10-11 LAB — BASIC METABOLIC PANEL
Anion gap: 13 (ref 5–15)
BUN: 33 mg/dL — ABNORMAL HIGH (ref 6–20)
CO2: 18 mmol/L — ABNORMAL LOW (ref 22–32)
Calcium: 9.4 mg/dL (ref 8.9–10.3)
Chloride: 106 mmol/L (ref 101–111)
Creatinine, Ser: 0.96 mg/dL (ref 0.44–1.00)
GFR calc Af Amer: >60 mL/min (ref >60)
GFR calc non Af Amer: >60 mL/min (ref >60)
Glucose, Bld: 342 mg/dL — ABNORMAL HIGH (ref 65–99)
Potassium: 4.1 mmol/L (ref 3.5–5.1)
Sodium: 137 mmol/L (ref 135–145)

## 2017-10-11 LAB — CBC
HCT: 30.1 % — ABNORMAL LOW (ref 36.0–46.0)
Hemoglobin: 8.9 g/dL — ABNORMAL LOW (ref 12.0–15.0)
MCH: 22.1 pg — ABNORMAL LOW (ref 26.0–34.0)
MCHC: 29.6 g/dL — ABNORMAL LOW (ref 30.0–36.0)
MCV: 74.9 fL — ABNORMAL LOW (ref 78.0–100.0)
Platelets: 92 10*3/uL — ABNORMAL LOW (ref 150–400)
RBC: 4.02 MIL/uL (ref 3.87–5.11)
RDW: 19 % — ABNORMAL HIGH (ref 11.5–15.5)
WBC: 5.2 10*3/uL (ref 4.0–10.5)

## 2017-10-11 MED ORDER — INSULIN ASPART 100 UNIT/ML ~~LOC~~ SOLN
20.0000 [IU] | Freq: Once | SUBCUTANEOUS | Status: AC
  Administered 2017-10-11: 20 [IU] via SUBCUTANEOUS

## 2017-10-11 MED ORDER — DIPHENHYDRAMINE HCL 25 MG PO CAPS
25.0000 mg | ORAL_CAPSULE | Freq: Once | ORAL | Status: AC
  Administered 2017-10-11: 25 mg via ORAL
  Filled 2017-10-11: qty 1

## 2017-10-11 MED ORDER — IPRATROPIUM-ALBUTEROL 0.5-2.5 (3) MG/3ML IN SOLN
3.0000 mL | Freq: Three times a day (TID) | RESPIRATORY_TRACT | Status: DC
  Administered 2017-10-12 (×2): 3 mL via RESPIRATORY_TRACT
  Filled 2017-10-11 (×2): qty 3

## 2017-10-11 MED ORDER — ACETAMINOPHEN 325 MG PO TABS: 650.0000 mg | ORAL_TABLET | Freq: Four times a day (QID) | ORAL | Status: DC | PRN

## 2017-10-11 MED ORDER — INSULIN ASPART 100 UNIT/ML ~~LOC~~ SOLN
6.0000 [IU] | Freq: Three times a day (TID) | SUBCUTANEOUS | Status: DC
  Administered 2017-10-11 – 2017-10-12 (×3): 6 [IU] via SUBCUTANEOUS

## 2017-10-11 MED ORDER — INSULIN ASPART 100 UNIT/ML ~~LOC~~ SOLN
10.0000 [IU] | Freq: Once | SUBCUTANEOUS | Status: AC
  Administered 2017-10-11: 10 [IU] via SUBCUTANEOUS

## 2017-10-11 MED ORDER — DOXYCYCLINE HYCLATE 100 MG PO TABS
100.0000 mg | ORAL_TABLET | Freq: Two times a day (BID) | ORAL | Status: DC
  Administered 2017-10-11 – 2017-10-12 (×2): 100 mg via ORAL
  Filled 2017-10-11 (×2): qty 1

## 2017-10-11 MED ORDER — PREDNISONE 50 MG PO TABS
50.0000 mg | ORAL_TABLET | Freq: Every day | ORAL | Status: DC
  Administered 2017-10-12: 50 mg via ORAL
  Filled 2017-10-11: qty 1

## 2017-10-11 NOTE — Progress Notes (Signed)
[facility] Progress Note  Patient: [name] [id number]:[id number]   PCP: Benito Mccreedy, MD DOB: 1950/09/23   DOA: 10/10/2017   DOS: 10/11/2017   Date of Service: the patient was seen and examined on 10/11/2017  Subjective: Shortness of breath is better but still not back to baseline.  Still breathing fast.  Brief hospital course: Pt. with PMH of COPD ; admitted on 10/10/2017, presented with complaint of shortness of breath, was found to have COPD exacerbation. Currently further plan is continue steroids.  Assessment and Plan: 1.  Acute bronchiolitis secondary to parainfluenza virus infection. Continue IV steroids.  Continue DuoNeb's.  Antibiotics have been started although will have low threshold to discontinue them. Monitor overnight.  2.  Essential hypertension. Continue blood pressure medications per home.  3.  Dehydration. Diuretics are on hold.  Monitor.  4.  GERD. Continue PPI.  5.  Type 2 diabetes mellitus. Uncontrolled hyperglycemia secondary to steroids. Continue sliding scale insulin, added pre-meal coverage. Monitor.  6.  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 Continue with gabapentin.  Diet: Carb modified diet DVT Prophylaxis: subcutaneous Heparin  Advance goals of care discussion: full coed  Family Communication: family was present at bedside, at the time of interview. The pt provided permission to discuss medical plan with the family. Opportunity was given to ask question and all questions were answered satisfactorily.   Disposition:  Discharge to home.  Consultants: none Procedures: none  Antibiotics: Anti-infectives (From admission, onward)   Start     Dose/Rate Route Frequency Ordered Stop   10/11/17 1800  doxycycline (VIBRA-TABS) tablet 100 mg     100 mg Oral Every 12 hours 10/11/17 1059     10/10/17 1700  doxycycline (VIBRAMYCIN) 100 mg in sodium chloride 0.9 % 250 mL IVPB  Status:  Discontinued     100 mg 125 mL/hr over 120  Minutes Intravenous Every 12 hours 10/10/17 1643 10/11/17 1058       Objective: Physical Exam: Vitals:   10/11/17 0808 10/11/17 1030 10/11/17 1339 10/11/17 1407  BP:  (!) 155/76 (!) 94/51   Pulse:  86 65   Resp:      Temp:   98 F (36.7 C)   TempSrc:   Oral   SpO2: 93%  94% 95%  Weight:      Height:        Intake/Output Summary (Last 24 hours) at 10/11/2017 1927 Last data filed at 10/11/2017 1700 Gross per 24 hour  Intake 905 ml  Output -  Net 905 ml   Filed Weights   10/10/17 0929  Weight: 90.7 kg (200 lb)   General: Alert, Awake and Oriented to Time, Place and Person. Appear in mild distress, affect appropriate Eyes: PERRL, Conjunctiva normal ENT: Oral Mucosa clear moist. Neck: no JVD, no Abnormal Mass Or lumps Cardiovascular: S1 and S2 Present, no Murmur, Peripheral Pulses Present Respiratory: normal respiratory effort, Bilateral Air entry equal and Decreased, no use of accessory muscle, no Crackles, bilateral  wheezes Abdomen: Bowel Sound present, Soft and no tenderness, no hernia Skin: no redness, no Rash, nono induration Extremities: no Pedal edema, no calf tenderness Neurologic: Grossly no focal neuro deficit. Bilaterally Equal motor strength  Data Reviewed: CBC: Recent Labs  Lab 10/10/17 1212 10/11/17 0412  WBC 6.3 5.2  HGB 9.8* 8.9*  HCT 33.2* 30.1*  MCV 76.1* 74.9*  PLT 88* 92*   Basic Metabolic Panel: Recent Labs  Lab 10/10/17 1212 10/11/17 0412  NA 145 137  K 3.2* 4.1  CL 108 106  CO2 23 18*  GLUCOSE 136* 342*  BUN 25* 33*  CREATININE 0.83 0.96  CALCIUM 9.7 9.4    Liver Function Tests: No results for input(s): AST, ALT, ALKPHOS, BILITOT, PROT, ALBUMIN in the last 168 hours. No results for input(s): LIPASE, AMYLASE in the last 168 hours. No results for input(s): AMMONIA in the last 168 hours. Coagulation Profile: No results for input(s): INR, PROTIME in the last 168 hours. Cardiac Enzymes: No results for input(s): CKTOTAL, CKMB,  CKMBINDEX, TROPONINI in the last 168 hours. BNP (last 3 results) No results for input(s): PROBNP in the last 8760 hours. CBG: Recent Labs  Lab 10/10/17 2359 10/11/17 0305 10/11/17 0729 10/11/17 1218 10/11/17 1656  GLUCAP 461* 345* 228* 368* 436*   Studies: No results found.  Scheduled Meds: . amLODipine  5 mg Oral Daily  . aspirin EC  81 mg Oral Daily  . atorvastatin  10 mg Oral q1800  . cetirizine  10 mg Oral Daily  . doxycycline  100 mg Oral Q12H  . insulin aspart  0-15 Units Subcutaneous TID WC  . insulin aspart  0-5 Units Subcutaneous QHS  . insulin aspart  6 Units Subcutaneous TID WC  . insulin glargine  20 Units Subcutaneous Daily  . ipratropium-albuterol  3 mL Nebulization Q6H  . losartan  25 mg Oral Daily  . pantoprazole  40 mg Oral Daily  . [START ON 10/12/2017] predniSONE  50 mg Oral Q breakfast  . pregabalin  200 mg Oral TID  . sodium chloride flush  3 mL Intravenous Q12H  . tiZANidine  4 mg Oral Daily   Continuous Infusions: . sodium chloride 500 mL (10/11/17 0515)   PRN Meds: sodium chloride, albuterol, dextromethorphan-guaiFENesin, HYDROcodone-acetaminophen, sodium chloride flush  Time spent: 35 minutes  Author: Berle Mull, MD [facility] Pager: [phone] 10/11/2017 7:27 PM  If 7PM-7AM, please contact night-coverage at [email or web address], password [facility]

## 2017-10-12 LAB — GLUCOSE, CAPILLARY
Glucose-Capillary: 298 mg/dL — ABNORMAL HIGH (ref 65–99)
Glucose-Capillary: 219 mg/dL — ABNORMAL HIGH (ref 65–99)

## 2017-10-12 MED ORDER — DM-GUAIFENESIN ER 30-600 MG PO TB12: 1.0000 | ORAL_TABLET | Freq: Two times a day (BID) | ORAL | 0 refills | Status: DC | PRN

## 2017-10-12 MED ORDER — LIP MEDEX EX OINT
TOPICAL_OINTMENT | CUTANEOUS | Status: AC
  Administered 2017-10-12: 14:00:00
  Filled 2017-10-12: qty 7

## 2017-10-12 MED ORDER — PREDNISONE 10 MG PO TABS: ORAL_TABLET | ORAL | 0 refills | Status: DC

## 2017-10-13 LAB — HIV ANTIBODY (ROUTINE TESTING W REFLEX): HIV Screen 4th Generation wRfx: NONREACTIVE

## 2017-10-13 NOTE — Discharge Summary (Signed)
[facility] Discharge Summary   Patient: [name] YTK:[id number]   PCP: Benito Mccreedy, MD DOB: 1951/04/27   Date of admission: 10/10/2017   Date of discharge: 10/12/2017    Discharge Diagnoses:  Active Problems:   Acute bronchitis and bronchiolitis   Benign essential HTN   GERD (gastroesophageal reflux disease)   Diabetes mellitus with nephropathy (HCC)   SOB (shortness of breath)   Acute bronchiolitis   Admitted From: home Disposition:  home  Recommendations for Outpatient Follow-up:  1. Please follow-up with PCP in 1 week  Follow-up Information    Osei-Bonsu, Iona Beard, MD. Schedule an appointment as soon as possible for a visit in 1 week(s).   Specialty:  Internal Medicine Contact information: [place] [id number] [phone]          Diet recommendation: Cardiac diet carb modified  Activity: The patient is advised to gradually reintroduce usual activities.  Discharge Condition: good  Code Status: full code  History of present illness: As per the H and P dictated on admission, "[name] is a 67 y.o. female with medical history significant of past medical history listed below.  Patient does not have formal diagnosis of COPD but has had bronchitis in the past.  Presents with several days of shortness of breath which has progressively gotten worse.  Nothing she is aware of makes it better.  Activity makes her shortness of breath worse.  She denies being around any sick contacts.  Denies any hemoptysis.  The problem started gradual and as mentioned above persisted as such patient presented to the ED for further evaluation recommendations"  Hospital Course:  Summary of her active problems in the hospital is as following. 1.  Acute bronchiolitis secondary to parainfluenza virus infection. Continue antibiotics, continue prednisone.  PRN inhalers.  2.  Essential hypertension. Continue blood pressure medications per  home.  3.  Dehydration. Diuretics resume on discharge.  4.  GERD. Continue PPI.  5.  Type 2 diabetes mellitus. Uncontrolled hyperglycemia secondary to steroids. Continue sliding scale insulin, added pre-meal coverage. Monitor.  6.  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 Continue with gabapentin.  All other chronic medical condition were stable during the hospitalization.  Patient was ambulatory without any assistance. On the day of the discharge the patient's vitals were stable, and no other acute medical condition were reported by patient. the patient was felt safe to be discharge at home with family.  Consultants: none Procedures: none  DISCHARGE MEDICATION: Allergies as of 10/12/2017   No Known Allergies     Medication List    TAKE these medications   ACCU-CHEK AVIVA PLUS test strip Generic drug:  glucose blood USE BID FOR GLUCOSE TESTING   amLODipine 5 MG tablet Commonly known as:  NORVASC Take 5 mg by mouth daily.   aspirin 81 MG tablet Take 81 mg by mouth daily.   atorvastatin 10 MG tablet Commonly known as:  LIPITOR Take 10 mg by mouth daily at 6 PM.   B-D ULTRAFINE III SHORT PEN 31G X 8 MM Misc Generic drug:  Insulin Pen Needle   CETIRIZINE HCL PO Take 10 mg by mouth daily.   dextromethorphan-guaiFENesin 30-600 MG 12hr tablet Commonly known as:  MUCINEX DM Take 1 tablet by mouth 2 (two) times daily as needed for cough.   etodolac 300 MG capsule Commonly known as:  LODINE Take 300 mg by mouth daily.   furosemide 20 MG tablet Commonly known as:  LASIX Take 20 mg  by mouth daily.   glimepiride 2 MG tablet Commonly known as:  AMARYL Take 2 mg by mouth 2 (two) times daily.   ibuprofen 800 MG tablet Commonly known as:  ADVIL,MOTRIN Take 800 mg by mouth daily as needed.   insulin glargine 100 UNIT/ML injection Commonly known as:  LANTUS Inject 20 Units into the skin daily. In the morning   ipratropium-albuterol 0.5-2.5 (3) MG/3ML  Soln Commonly known as:  DUONEB Take 3 mLs by nebulization 2 (two) times daily.   JARDIANCE 25 MG Tabs tablet Generic drug:  empagliflozin Take 25 mg by mouth daily.   losartan 25 MG tablet Commonly known as:  COZAAR Take 1 tablet (25 mg total) by mouth daily.   LYRICA 200 MG capsule Generic drug:  pregabalin Take 200 mg by mouth 3 (three) times daily.   metFORMIN 1000 MG tablet Commonly known as:  GLUCOPHAGE Take 1,000 mg by mouth 2 (two) times daily.   omeprazole 20 MG capsule Commonly known as:  PRILOSEC Take 20 mg by mouth daily.   predniSONE 10 MG tablet Commonly known as:  DELTASONE Take 30mg  daily for 2days,Take 20mg  daily for 2days,Take 10mg  daily for 2days, then stop.   tiZANidine 4 MG tablet Commonly known as:  ZANAFLEX Take 4 mg by mouth daily.   VENTOLIN HFA 108 (90 Base) MCG/ACT inhaler Generic drug:  albuterol Inhale 2 puffs into the lungs every 4 (four) hours as needed.   vitamin E 400 UNIT capsule Apply 400 Units topically daily. Breaks open capsule and rubs on oil      No Known Allergies Discharge Instructions    Diet Carb Modified   Complete by:  As directed    Discharge instructions   Complete by:  As directed    It is important that you read following instructions as well as go over your medication list with RN to help you understand your care after this hospitalization.  Discharge Instructions: Please follow-up with PCP in one week  Please request your primary care physician to go over all Hospital Tests and Procedure/Radiological results at the follow up,  Please get all Hospital records sent to your PCP by signing hospital release before you go home.   Do not take more than prescribed Pain, Sleep and Anxiety Medications. You were cared for by a hospitalist during your hospital stay. If you have any questions about your discharge medications or the care you received while you were in the hospital after you are discharged, you can call the  unit and ask to speak with the hospitalist on call if the hospitalist that took care of you is not available.  Once you are discharged, your primary care physician will handle any further medical issues. Please note that NO REFILLS for any discharge medications will be authorized once you are discharged, as it is imperative that you return to your primary care physician (or establish a relationship with a primary care physician if you do not have one) for your aftercare needs so that they can reassess your need for medications and monitor your lab values. You Must read complete instructions/literature along with all the possible adverse reactions/side effects for all the Medicines you take and that have been prescribed to you. Take any new Medicines after you have completely understood and accept all the possible adverse reactions/side effects. Wear Seat belts while driving. If you have smoked or chewed Tobacco in the last 2 yrs please stop smoking and/or stop any Recreational drug use.  Increase activity slowly   Complete by:  As directed      Discharge Exam: Filed Weights   10/10/17 0929  Weight: 90.7 kg (200 lb)   Vitals:   10/12/17 1315 10/12/17 1328  BP: 100/66   Pulse: (!) 49   Resp:    Temp: 97.7 F (36.5 C)   SpO2: 100% 99%   General: Appear in no distress, no Rash; Oral Mucosa moist. Cardiovascular: S1 and S2 Present, no Murmur, no JVD Respiratory: Bilateral Air entry present and Clear to Auscultation, no Crackles, no wheezes Abdomen: Bowel Sound present, Soft and no tenderness Extremities: n Pedal edema, ono calf tenderness Neurology: Grossly no focal neuro deficit.  The results of significant diagnostics from this hospitalization (including imaging, microbiology, ancillary and laboratory) are listed below for reference.    Significant Diagnostic Studies: Dg Chest 2 View  Result Date: 10/10/2017 CLINICAL DATA:  Chest pain.  Shortness of breath.  Productive cough. EXAM:  CHEST - 2 VIEW COMPARISON:  Oct 21, 2015 FINDINGS: No pneumothorax. Chronic interstitial prominence in the bases suggestive of bronchitic change. No nodules or masses. No new focal infiltrates. The cardiomediastinal silhouette is stable. IMPRESSION: Chronic interstitial changes in the bases, likely bronchitic change. No acute infiltrate. Electronically Signed   By: Dorise Bullion III M.D   On: 10/10/2017 10:33    Microbiology: Recent Results (from the past 240 hour(s))  Respiratory Panel by PCR     Status: Abnormal   Collection Time: 10/10/17  6:35 PM  Result Value Ref Range Status   Adenovirus NOT DETECTED NOT DETECTED Final   Coronavirus 229E NOT DETECTED NOT DETECTED Final   Coronavirus HKU1 NOT DETECTED NOT DETECTED Final   Coronavirus NL63 NOT DETECTED NOT DETECTED Final   Coronavirus OC43 NOT DETECTED NOT DETECTED Final   Metapneumovirus NOT DETECTED NOT DETECTED Final   Rhinovirus / Enterovirus NOT DETECTED NOT DETECTED Final   Influenza A NOT DETECTED NOT DETECTED Final   Influenza B NOT DETECTED NOT DETECTED Final   Parainfluenza Virus 1 NOT DETECTED NOT DETECTED Final   Parainfluenza Virus 2 NOT DETECTED NOT DETECTED Final   Parainfluenza Virus 3 DETECTED (A) NOT DETECTED Final   Parainfluenza Virus 4 NOT DETECTED NOT DETECTED Final   Respiratory Syncytial Virus NOT DETECTED NOT DETECTED Final   Bordetella pertussis NOT DETECTED NOT DETECTED Final   Chlamydophila pneumoniae NOT DETECTED NOT DETECTED Final   Mycoplasma pneumoniae NOT DETECTED NOT DETECTED Final    Comment: Performed at [facility], [place] [address], [place], [place] [address]     Labs: CBC: Recent Labs  Lab 10/10/17 1212 10/11/17 0412  WBC 6.3 5.2  HGB 9.8* 8.9*  HCT 33.2* 30.1*  MCV 76.1* 74.9*  PLT 88* 92*   Basic Metabolic Panel: Recent Labs  Lab 10/10/17 1212 10/11/17 0412  NA 145 137  K 3.2* 4.1  CL 108 106  CO2 23 18*  GLUCOSE 136* 342*  BUN 25* 33*  CREATININE 0.83 0.96   CALCIUM 9.7 9.4   CBG: Recent Labs  Lab 10/11/17 1218 10/11/17 1656 10/11/17 2114 10/12/17 0735 10/12/17 1146  GLUCAP 368* 436* 416* 298* 219*   Time spent: 35 minutes  Signed:  Berle Mull  [facility] 10/12/2017 , 7:19 PM

## 2017-10-19 DIAGNOSIS — I10 Essential (primary) hypertension: Secondary | ICD-10-CM | POA: Diagnosis not present

## 2017-10-19 DIAGNOSIS — R6889 Other general symptoms and signs: Secondary | ICD-10-CM | POA: Diagnosis not present

## 2017-10-19 DIAGNOSIS — N189 Chronic kidney disease, unspecified: Secondary | ICD-10-CM | POA: Diagnosis not present

## 2017-10-19 DIAGNOSIS — E785 Hyperlipidemia, unspecified: Secondary | ICD-10-CM | POA: Diagnosis not present

## 2017-10-19 DIAGNOSIS — J45909 Unspecified asthma, uncomplicated: Secondary | ICD-10-CM | POA: Diagnosis not present

## 2017-10-19 DIAGNOSIS — E119 Type 2 diabetes mellitus without complications: Secondary | ICD-10-CM | POA: Diagnosis not present

## 2017-10-19 DIAGNOSIS — E1140 Type 2 diabetes mellitus with diabetic neuropathy, unspecified: Secondary | ICD-10-CM | POA: Diagnosis not present

## 2017-11-10 DIAGNOSIS — R6889 Other general symptoms and signs: Secondary | ICD-10-CM | POA: Diagnosis not present

## 2017-11-16 ENCOUNTER — Ambulatory Visit (INDEPENDENT_AMBULATORY_CARE_PROVIDER_SITE_OTHER): Payer: Medicare HMO | Admitting: Obstetrics & Gynecology

## 2017-11-16 ENCOUNTER — Other Ambulatory Visit (HOSPITAL_COMMUNITY)
Admission: RE | Admit: 2017-11-16 | Discharge: 2017-11-16 | Disposition: A | Discharge status: Home / Self Care | Payer: Medicare HMO | Source: Ambulatory Visit | Attending: Obstetrics & Gynecology | Admitting: Obstetrics & Gynecology

## 2017-11-16 ENCOUNTER — Encounter: Payer: Self-pay | Admitting: Obstetrics & Gynecology

## 2017-11-16 VITALS — BP 136/69 | HR 93 | Ht 64.0 in | Wt 201.0 lb

## 2017-11-16 DIAGNOSIS — D072 Carcinoma in situ of vagina: Secondary | ICD-10-CM | POA: Diagnosis not present

## 2017-11-16 DIAGNOSIS — Z01419 Encounter for gynecological examination (general) (routine) without abnormal findings: Secondary | ICD-10-CM | POA: Diagnosis not present

## 2017-11-16 DIAGNOSIS — R6889 Other general symptoms and signs: Secondary | ICD-10-CM | POA: Diagnosis not present

## 2017-11-16 MED ORDER — ESTRADIOL 0.1 MG/GM VA CREA: TOPICAL_CREAM | VAGINAL | 12 refills | Status: DC

## 2017-11-16 NOTE — Progress Notes (Signed)
pSubjective:    [name] is a 67 y.o. divorced P3 (42, 110, and 3 yo kids- 60 grands, 4 great grands) female who presents for an annual exam. The patient has no complaints today. The patient is not currently sexually active. GYN screening history: last pap: was abnormal: but she was prescribed a "cream". FINAL DIAGNOSIS: SEE NOTEAbnormal    Comment: -  EPITHELIAL CELL ABNORMALITY: SQUAMOUS CELLS  HIGH-GRADE SQUAMOUS INTRAEPITHELIAL LESION (HSIL)  ENCOMPASSING: MODERATE AND SEVERE DYSPLASIA, VAIN2  AND/OR VAIN3/CIS.   Dr. Jodi Mourning did a colposcopy of her cuff and ordered a referral to gyn onc. She saw Dr. Lanae Crumbly who prescribed premarin cream and rec'd a repeat colpo in 3 months. She did not follow up.   The patient wears seatbelts: yes. The patient participates in regular exercise: yes. Has the patient ever been transfused or tattooed?: yes. The patient reports that there is not domestic violence in her life.   Menstrual History: OB History    Gravida  4   Para  3   Term  3   Preterm  0   AB  1   Living  3     SAB  0   TAB  1   Ectopic  0   Multiple  0   Live Births  3           Menarche age: 10 No LMP recorded. Patient has had a hysterectomy.    The following portions of the patient's history were reviewed and updated as appropriate: allergies, current medications, past family history, past medical history, past social history, past surgical history and problem list.  Review of Systems Pertinent items are noted in HPI.   No breast/gyn cancer Her GF had colon cancer She had a colonoscopy 2019   Objective:    BP 136/69   Pulse 93   Ht 5\' 4"  (1.626 m)   Wt 201 lb (91.2 kg)   BMI 34.50 kg/m   General Appearance:    Alert, cooperative, no distress, appears stated age  Head:    Normocephalic, without obvious abnormality, atraumatic  Eyes:    PERRL, conjunctiva/corneas clear, EOM's intact, fundi    benign, both eyes  Ears:    Normal TM's  and external ear canals, both ears  Nose:   Nares normal, septum midline, mucosa normal, no drainage    or sinus tenderness  Throat:   Lips, mucosa, and tongue normal; teeth and gums normal  Neck:   Supple, symmetrical, trachea midline, no adenopathy;    thyroid:  no enlargement/tenderness/nodules; no carotid   bruit or JVD  Back:     Symmetric, no curvature, ROM normal, no CVA tenderness  Lungs:     Clear to auscultation bilaterally, respirations unlabored  Chest Wall:    No tenderness or deformity   Heart:    Regular rate and rhythm, S1 and S2 normal, no murmur, rub   or gallop  Breast Exam:    No tenderness, masses, or nipple abnormality  Abdomen:     Soft, non-tender, bowel sounds active all four quadrants,    no masses, no organomegaly. obese  Genitalia:    Normal female without lesion, discharge or tenderness, moderate atrophy, no gross lesions of the vagina, no masses or tenderness with bimanual exam     Extremities:   Extremities normal, atraumatic, no cyanosis or edema  Pulses:   2+ and symmetric all extremities  Skin:   Skin color, texture, turgor normal, no  rashes or lesions  Lymph nodes:   Cervical, supraclavicular, and axillary nodes normal  Neurologic:   CNII-XII intact, normal strength, sensation and reflexes    throughout  .    Assessment:    Healthy female exam.   H/O untreated high grade VAIN Plan:     Mammogram. Thin prep Pap smear. with cotesting Premarin cream

## 2017-11-23 ENCOUNTER — Telehealth: Payer: Self-pay | Admitting: General Practice

## 2017-11-23 NOTE — Telephone Encounter (Signed)
Called patient & informed her of results and follow up recommended. Patient verbalized understanding & will await phone call from front office staff with an appt. Patient had no questions.

## 2017-11-23 NOTE — Telephone Encounter (Signed)
Called and notified patient of appointment scheduled for 12/21/17 at 1:15pm with Dr. Hulan Fray for [name].  Patient verbalized understanding.

## 2017-11-23 NOTE — Telephone Encounter (Signed)
-----   Message from Emily Filbert, MD sent at 11/19/2017  7:36 AM EDT ----- ASCUS with + HR HPV. She will need a colpo of her vaginal cuff. Thanks She has had this in the past.

## 2017-11-25 LAB — CYTOLOGY - PAP
Diagnosis: UNDETERMINED — AB
HPV: DETECTED — AB

## 2017-12-03 DIAGNOSIS — E119 Type 2 diabetes mellitus without complications: Secondary | ICD-10-CM | POA: Diagnosis not present

## 2017-12-03 DIAGNOSIS — D638 Anemia in other chronic diseases classified elsewhere: Secondary | ICD-10-CM | POA: Diagnosis not present

## 2017-12-03 DIAGNOSIS — E1140 Type 2 diabetes mellitus with diabetic neuropathy, unspecified: Secondary | ICD-10-CM | POA: Diagnosis not present

## 2017-12-03 DIAGNOSIS — I10 Essential (primary) hypertension: Secondary | ICD-10-CM | POA: Diagnosis not present

## 2017-12-03 DIAGNOSIS — N189 Chronic kidney disease, unspecified: Secondary | ICD-10-CM | POA: Diagnosis not present

## 2017-12-03 DIAGNOSIS — R6889 Other general symptoms and signs: Secondary | ICD-10-CM | POA: Diagnosis not present

## 2017-12-03 DIAGNOSIS — Z0000 Encounter for general adult medical examination without abnormal findings: Secondary | ICD-10-CM | POA: Diagnosis not present

## 2017-12-03 DIAGNOSIS — J45909 Unspecified asthma, uncomplicated: Secondary | ICD-10-CM | POA: Diagnosis not present

## 2017-12-03 DIAGNOSIS — E785 Hyperlipidemia, unspecified: Secondary | ICD-10-CM | POA: Diagnosis not present

## 2017-12-21 ENCOUNTER — Ambulatory Visit: Payer: Medicare HMO | Admitting: Obstetrics & Gynecology

## 2018-01-07 DIAGNOSIS — G894 Chronic pain syndrome: Secondary | ICD-10-CM | POA: Diagnosis not present

## 2018-01-07 DIAGNOSIS — M25511 Pain in right shoulder: Secondary | ICD-10-CM | POA: Diagnosis not present

## 2018-01-07 DIAGNOSIS — M25512 Pain in left shoulder: Secondary | ICD-10-CM | POA: Diagnosis not present

## 2018-01-07 DIAGNOSIS — Z79891 Long term (current) use of opiate analgesic: Secondary | ICD-10-CM | POA: Diagnosis not present

## 2018-01-07 DIAGNOSIS — M25561 Pain in right knee: Secondary | ICD-10-CM | POA: Diagnosis not present

## 2018-01-07 DIAGNOSIS — G8929 Other chronic pain: Secondary | ICD-10-CM | POA: Diagnosis not present

## 2018-01-07 DIAGNOSIS — M25562 Pain in left knee: Secondary | ICD-10-CM | POA: Diagnosis not present

## 2018-01-07 DIAGNOSIS — M545 Low back pain: Secondary | ICD-10-CM | POA: Diagnosis not present

## 2018-01-21 DIAGNOSIS — M25561 Pain in right knee: Secondary | ICD-10-CM | POA: Diagnosis not present

## 2018-01-21 DIAGNOSIS — M25562 Pain in left knee: Secondary | ICD-10-CM | POA: Diagnosis not present

## 2018-01-21 DIAGNOSIS — M79662 Pain in left lower leg: Secondary | ICD-10-CM | POA: Diagnosis not present

## 2018-01-21 DIAGNOSIS — M546 Pain in thoracic spine: Secondary | ICD-10-CM | POA: Diagnosis not present

## 2018-01-21 DIAGNOSIS — G894 Chronic pain syndrome: Secondary | ICD-10-CM | POA: Diagnosis not present

## 2018-01-25 ENCOUNTER — Encounter: Payer: Self-pay | Admitting: Obstetrics & Gynecology

## 2018-01-25 ENCOUNTER — Ambulatory Visit (INDEPENDENT_AMBULATORY_CARE_PROVIDER_SITE_OTHER): Payer: Medicare Other | Admitting: Obstetrics & Gynecology

## 2018-01-25 ENCOUNTER — Other Ambulatory Visit (HOSPITAL_COMMUNITY)
Admission: RE | Admit: 2018-01-25 | Discharge: 2018-01-25 | Disposition: A | Discharge status: Home / Self Care | Payer: Medicare Other | Source: Ambulatory Visit | Attending: Obstetrics & Gynecology | Admitting: Obstetrics & Gynecology

## 2018-01-25 VITALS — BP 136/64 | HR 71 | Ht 64.0 in | Wt 200.0 lb

## 2018-01-25 DIAGNOSIS — R87811 Vaginal high risk human papillomavirus (HPV) DNA test positive: Secondary | ICD-10-CM | POA: Insufficient documentation

## 2018-01-25 DIAGNOSIS — R87620 Atypical squamous cells of undetermined significance on cytologic smear of vagina (ASC-US): Secondary | ICD-10-CM | POA: Diagnosis not present

## 2018-01-25 DIAGNOSIS — N890 Mild vaginal dysplasia: Secondary | ICD-10-CM | POA: Diagnosis not present

## 2018-01-25 NOTE — Progress Notes (Signed)
   Subjective:    Patient ID: [name], female    DOB: Dec 25, 1950, 67 y.o.   MRN: [id number]  HPI 67 yo P3 here for a colpo of her vaginal cuff. She has a h/o VAIN 3 and was seen by Dr. Aldean Ast. She did not keep her followup appt with him in the past. I did a pap of her cuff last month and the result showed ASCUS + HR HPV.   Review of Systems     Objective:   Physical Exam Breathing, conversing, and ambulating normally Well nourished, well hydrated Black female, no apparent distress I placed vinegar in her vagina for 4 minutes and then did a colpo of the cuff. There was an excoriated area at the 3 o'clock position but I think this may have been from trauma from the speculum. I did biopsy it and then used silver nitrate for hemostasis. There was a very small (about 3 mm) area of slightly whiter shade at the cuff at the 4 o'clock position. I biopsied this also. Again, silver nitrate achieved hemostasis.     Assessment & Plan:  H/o VAIN 3, now with ASCUS + HR HPV- her colpo does not show any high grade disease. I will treat/refer based on pathology results. She will be notified by phone and is fine with this.

## 2018-01-26 DIAGNOSIS — E119 Type 2 diabetes mellitus without complications: Secondary | ICD-10-CM | POA: Diagnosis not present

## 2018-01-26 DIAGNOSIS — I10 Essential (primary) hypertension: Secondary | ICD-10-CM | POA: Diagnosis not present

## 2018-01-26 DIAGNOSIS — N189 Chronic kidney disease, unspecified: Secondary | ICD-10-CM | POA: Diagnosis not present

## 2018-01-26 DIAGNOSIS — E785 Hyperlipidemia, unspecified: Secondary | ICD-10-CM | POA: Diagnosis not present

## 2018-01-26 DIAGNOSIS — E1140 Type 2 diabetes mellitus with diabetic neuropathy, unspecified: Secondary | ICD-10-CM | POA: Diagnosis not present

## 2018-02-12 DIAGNOSIS — E1140 Type 2 diabetes mellitus with diabetic neuropathy, unspecified: Secondary | ICD-10-CM | POA: Diagnosis not present

## 2018-02-12 DIAGNOSIS — I10 Essential (primary) hypertension: Secondary | ICD-10-CM | POA: Diagnosis not present

## 2018-02-12 DIAGNOSIS — E785 Hyperlipidemia, unspecified: Secondary | ICD-10-CM | POA: Diagnosis not present

## 2018-02-12 DIAGNOSIS — J45909 Unspecified asthma, uncomplicated: Secondary | ICD-10-CM | POA: Diagnosis not present

## 2018-02-12 DIAGNOSIS — E119 Type 2 diabetes mellitus without complications: Secondary | ICD-10-CM | POA: Diagnosis not present

## 2018-02-17 DIAGNOSIS — G894 Chronic pain syndrome: Secondary | ICD-10-CM | POA: Diagnosis not present

## 2018-02-17 DIAGNOSIS — M25562 Pain in left knee: Secondary | ICD-10-CM | POA: Diagnosis not present

## 2018-02-17 DIAGNOSIS — Z79891 Long term (current) use of opiate analgesic: Secondary | ICD-10-CM | POA: Diagnosis not present

## 2018-02-17 DIAGNOSIS — M25512 Pain in left shoulder: Secondary | ICD-10-CM | POA: Diagnosis not present

## 2018-02-17 DIAGNOSIS — G8929 Other chronic pain: Secondary | ICD-10-CM | POA: Diagnosis not present

## 2018-02-17 DIAGNOSIS — M545 Low back pain: Secondary | ICD-10-CM | POA: Diagnosis not present

## 2018-02-17 DIAGNOSIS — M25561 Pain in right knee: Secondary | ICD-10-CM | POA: Diagnosis not present

## 2018-02-17 DIAGNOSIS — M25511 Pain in right shoulder: Secondary | ICD-10-CM | POA: Diagnosis not present

## 2018-03-01 DIAGNOSIS — M25562 Pain in left knee: Secondary | ICD-10-CM | POA: Diagnosis not present

## 2018-03-04 DIAGNOSIS — M25561 Pain in right knee: Secondary | ICD-10-CM | POA: Diagnosis not present

## 2018-03-04 DIAGNOSIS — M25512 Pain in left shoulder: Secondary | ICD-10-CM | POA: Diagnosis not present

## 2018-03-04 DIAGNOSIS — M25511 Pain in right shoulder: Secondary | ICD-10-CM | POA: Diagnosis not present

## 2018-03-04 DIAGNOSIS — M25562 Pain in left knee: Secondary | ICD-10-CM | POA: Diagnosis not present

## 2018-03-04 DIAGNOSIS — Z79891 Long term (current) use of opiate analgesic: Secondary | ICD-10-CM | POA: Diagnosis not present

## 2018-03-04 DIAGNOSIS — G8929 Other chronic pain: Secondary | ICD-10-CM | POA: Diagnosis not present

## 2018-03-04 DIAGNOSIS — G894 Chronic pain syndrome: Secondary | ICD-10-CM | POA: Diagnosis not present

## 2018-03-04 DIAGNOSIS — M545 Low back pain: Secondary | ICD-10-CM | POA: Diagnosis not present

## 2018-03-05 ENCOUNTER — Encounter: Payer: Self-pay | Admitting: *Deleted

## 2018-03-10 DIAGNOSIS — G894 Chronic pain syndrome: Secondary | ICD-10-CM | POA: Diagnosis not present

## 2018-03-10 DIAGNOSIS — Z5181 Encounter for therapeutic drug level monitoring: Secondary | ICD-10-CM | POA: Diagnosis not present

## 2018-03-10 DIAGNOSIS — E119 Type 2 diabetes mellitus without complications: Secondary | ICD-10-CM | POA: Diagnosis not present

## 2018-03-10 DIAGNOSIS — Z79891 Long term (current) use of opiate analgesic: Secondary | ICD-10-CM | POA: Diagnosis not present

## 2018-03-24 DIAGNOSIS — Z79891 Long term (current) use of opiate analgesic: Secondary | ICD-10-CM | POA: Diagnosis not present

## 2018-03-24 DIAGNOSIS — G894 Chronic pain syndrome: Secondary | ICD-10-CM | POA: Diagnosis not present

## 2018-03-24 DIAGNOSIS — Z5181 Encounter for therapeutic drug level monitoring: Secondary | ICD-10-CM | POA: Diagnosis not present

## 2018-03-24 DIAGNOSIS — E119 Type 2 diabetes mellitus without complications: Secondary | ICD-10-CM | POA: Diagnosis not present

## 2018-04-16 DIAGNOSIS — G4733 Obstructive sleep apnea (adult) (pediatric): Secondary | ICD-10-CM | POA: Diagnosis not present

## 2018-04-21 DIAGNOSIS — E119 Type 2 diabetes mellitus without complications: Secondary | ICD-10-CM | POA: Diagnosis not present

## 2018-04-21 DIAGNOSIS — Z79891 Long term (current) use of opiate analgesic: Secondary | ICD-10-CM | POA: Diagnosis not present

## 2018-04-21 DIAGNOSIS — G894 Chronic pain syndrome: Secondary | ICD-10-CM | POA: Diagnosis not present

## 2018-04-21 DIAGNOSIS — Z5181 Encounter for therapeutic drug level monitoring: Secondary | ICD-10-CM | POA: Diagnosis not present

## 2018-05-21 ENCOUNTER — Other Ambulatory Visit: Payer: Self-pay | Admitting: Family

## 2018-05-21 DIAGNOSIS — Z13820 Encounter for screening for osteoporosis: Secondary | ICD-10-CM

## 2018-05-21 DIAGNOSIS — Z1231 Encounter for screening mammogram for malignant neoplasm of breast: Secondary | ICD-10-CM

## 2018-06-14 ENCOUNTER — Other Ambulatory Visit: Payer: Self-pay | Admitting: Family

## 2018-06-14 DIAGNOSIS — R011 Cardiac murmur, unspecified: Secondary | ICD-10-CM

## 2018-06-22 ENCOUNTER — Other Ambulatory Visit: Payer: Self-pay

## 2018-06-22 ENCOUNTER — Ambulatory Visit (HOSPITAL_COMMUNITY): Payer: Medicare Other | Attending: Cardiology

## 2018-06-22 DIAGNOSIS — R011 Cardiac murmur, unspecified: Secondary | ICD-10-CM | POA: Diagnosis present

## 2018-07-16 ENCOUNTER — Ambulatory Visit
Admission: RE | Admit: 2018-07-16 | Discharge: 2018-07-16 | Disposition: A | Discharge status: Home / Self Care | Payer: Medicare Other | Source: Ambulatory Visit | Attending: Family | Admitting: Family

## 2018-07-16 ENCOUNTER — Ambulatory Visit: Payer: Medicare Other

## 2018-07-16 DIAGNOSIS — Z13820 Encounter for screening for osteoporosis: Secondary | ICD-10-CM

## 2018-07-16 DIAGNOSIS — Z1231 Encounter for screening mammogram for malignant neoplasm of breast: Secondary | ICD-10-CM

## 2018-10-11 ENCOUNTER — Emergency Department (HOSPITAL_COMMUNITY): Payer: Medicare Other

## 2018-10-11 ENCOUNTER — Encounter (HOSPITAL_COMMUNITY): Payer: Self-pay | Admitting: Emergency Medicine

## 2018-10-11 ENCOUNTER — Emergency Department (HOSPITAL_COMMUNITY)
Admission: EM | Admit: 2018-10-11 | Discharge: 2018-10-11 | Disposition: A | Discharge status: Home / Self Care | Payer: Medicare Other | Attending: Emergency Medicine | Admitting: Emergency Medicine

## 2018-10-11 ENCOUNTER — Other Ambulatory Visit: Payer: Self-pay

## 2018-10-11 DIAGNOSIS — S0990XA Unspecified injury of head, initial encounter: Secondary | ICD-10-CM | POA: Insufficient documentation

## 2018-10-11 DIAGNOSIS — N183 Chronic kidney disease, stage 3 (moderate): Secondary | ICD-10-CM | POA: Diagnosis not present

## 2018-10-11 DIAGNOSIS — S81012A Laceration without foreign body, left knee, initial encounter: Secondary | ICD-10-CM | POA: Diagnosis not present

## 2018-10-11 DIAGNOSIS — Z79899 Other long term (current) drug therapy: Secondary | ICD-10-CM | POA: Insufficient documentation

## 2018-10-11 DIAGNOSIS — E1122 Type 2 diabetes mellitus with diabetic chronic kidney disease: Secondary | ICD-10-CM | POA: Diagnosis not present

## 2018-10-11 DIAGNOSIS — Y92008 Other place in unspecified non-institutional (private) residence as the place of occurrence of the external cause: Secondary | ICD-10-CM | POA: Insufficient documentation

## 2018-10-11 DIAGNOSIS — Z7982 Long term (current) use of aspirin: Secondary | ICD-10-CM | POA: Insufficient documentation

## 2018-10-11 DIAGNOSIS — Y999 Unspecified external cause status: Secondary | ICD-10-CM | POA: Diagnosis not present

## 2018-10-11 DIAGNOSIS — W19XXXA Unspecified fall, initial encounter: Secondary | ICD-10-CM

## 2018-10-11 DIAGNOSIS — Y9389 Activity, other specified: Secondary | ICD-10-CM | POA: Diagnosis not present

## 2018-10-11 DIAGNOSIS — Z794 Long term (current) use of insulin: Secondary | ICD-10-CM | POA: Diagnosis not present

## 2018-10-11 DIAGNOSIS — I129 Hypertensive chronic kidney disease with stage 1 through stage 4 chronic kidney disease, or unspecified chronic kidney disease: Secondary | ICD-10-CM | POA: Insufficient documentation

## 2018-10-11 DIAGNOSIS — W108XXA Fall (on) (from) other stairs and steps, initial encounter: Secondary | ICD-10-CM | POA: Insufficient documentation

## 2018-10-11 DIAGNOSIS — S8992XA Unspecified injury of left lower leg, initial encounter: Secondary | ICD-10-CM | POA: Diagnosis present

## 2018-10-11 MED ORDER — LIDOCAINE HCL (PF) 1 % IJ SOLN
10.0000 mL | Freq: Once | INTRAMUSCULAR | Status: AC
  Administered 2018-10-11: 10 mL
  Filled 2018-10-11: qty 30

## 2018-10-11 NOTE — ED Provider Notes (Signed)
[facility] DEPT Provider Note   CSN: [id number] Arrival date & time: 10/11/18  1633    History   Chief Complaint Chief Complaint  Patient presents with  . Fall  . Knee Pain    left    HPI [name] is a 68 y.o. female.     68 year old female with prior medical history as detailed below presents for evaluation following reported fall.  Patient reports that she was descending a flight of stairs at her daughter's house.  She lost her balance and fell.  She did strike her left knee with a laceration to the anterior aspect of the left knee.  She also struck her head.  She did not pass out.  She denies neck pain.  She denies other injury.  Her tetanus is up-to-date.  Her left knee laceration was dressed by a neighbor prior to arrival.  She has been ambulatory following the fall.  She denies other injury or complaint.  The history is provided by the patient and medical records.  Fall  This is a new problem. The current episode started 1 to 2 hours ago. The problem occurs rarely. The problem has not changed since onset.Pertinent negatives include no chest pain, no abdominal pain, no headaches and no shortness of breath. Nothing aggravates the symptoms. Nothing relieves the symptoms.  Knee Pain    Past Medical History:  Diagnosis Date  . Arthritis   . Diabetes mellitus without complication ([place])   . GERD (gastroesophageal reflux disease)   . H/O hiatal hernia   . Headache(784.0)   . Hyperlipidemia   . Hypertension   . Neuromuscular disorder ([facility])    neuropathy feet   . Sleep apnea    recently diagnosed- wears cpap    Patient Active Problem List   Diagnosis Date Noted  . Acute bronchiolitis 10/10/2017  . Shortness of breath 10/21/2015  . Acute bronchitis and bronchiolitis 10/21/2015  . Lactic acidosis 10/21/2015  . Benign essential HTN 10/21/2015  . GERD (gastroesophageal reflux disease) 10/21/2015  . Diabetes mellitus with  nephropathy ([place]) 10/21/2015  . Acute renal failure superimposed on stage 3 chronic kidney disease ([place]) 10/21/2015  . Chronic arthritis 10/21/2015  . SOB (shortness of breath) 10/21/2015  . OSA (obstructive sleep apnea) 10/11/2012  . Rotator cuff tear, non-traumatic 08/25/2012    Past Surgical History:  Procedure Laterality Date  . ABDOMINAL HYSTERECTOMY     partial  . BACK SURGERY    . CHOLECYSTECTOMY    . COLONOSCOPY     age 63- doesnt know where or who   . left shoulder     x 2 surgeries  . right shoulder     x2  . SHOULDER OPEN ROTATOR CUFF REPAIR Right 08/25/2012   Procedure: ROTATOR CUFF REPAIR SHOULDER OPEN WITH ACROMIOPLASTY;  Surgeon: Tobi Bastos, MD;  Location: [facility] ORS;  Service: Orthopedics;  Laterality: Right;  . TONSILLECTOMY       OB History    Gravida  4   Para  3   Term  3   Preterm  0   AB  1   Living  3     SAB  0   TAB  1   Ectopic  0   Multiple  0   Live Births  3            Home Medications    Prior to Admission medications   Medication Sig Start Date End Date Taking? Authorizing Provider  ACCU-CHEK AVIVA PLUS test strip USE BID FOR GLUCOSE TESTING 12/19/14   [provider]  amLODipine (NORVASC) 5 MG tablet Take 5 mg by mouth daily.    [provider]  aspirin 81 MG tablet Take 81 mg by mouth daily. 09/24/17   [provider]  atorvastatin (LIPITOR) 10 MG tablet Take 10 mg by mouth daily at 6 PM.    [provider]  B-D ULTRAFINE III SHORT PEN 31G X 8 MM MISC  09/27/14   [provider]  CETIRIZINE HCL PO Take 10 mg by mouth daily.    [provider]  etodolac (LODINE) 300 MG capsule Take 300 mg by mouth daily. 10/08/17   [provider]  furosemide (LASIX) 20 MG tablet Take 20 mg by mouth daily.  09/13/15   [provider]  glimepiride (AMARYL) 2 MG tablet Take 2 mg by mouth 2 (two) times daily.     [provider]  ibuprofen (ADVIL,MOTRIN) 800 MG  tablet Take 800 mg by mouth daily as needed. 08/25/17   [provider]  insulin glargine (LANTUS) 100 UNIT/ML injection Inject 20 Units into the skin daily. In the morning     [provider]  ipratropium-albuterol (DUONEB) 0.5-2.5 (3) MG/3ML SOLN Take 3 mLs by nebulization 2 (two) times daily. 10/23/15   Barton Dubois, MD  JARDIANCE 25 MG TABS tablet Take 25 mg by mouth daily. 09/21/17   [provider]  losartan (COZAAR) 25 MG tablet Take 1 tablet (25 mg total) by mouth daily. 10/23/15   Barton Dubois, MD  LYRICA 200 MG capsule Take 200 mg by mouth 3 (three) times daily. 10/08/17   [provider]  metFORMIN (GLUCOPHAGE) 1000 MG tablet Take 1,000 mg by mouth 2 (two) times daily. 07/19/17   [provider]  Multiple Vitamins-Minerals (WOMENS MULTIVITAMIN PO) Take by mouth.    [provider]  omeprazole (PRILOSEC) 20 MG capsule Take 20 mg by mouth daily.  10/05/14   [provider]  tiZANidine (ZANAFLEX) 4 MG tablet Take 4 mg by mouth daily.  10/17/14   [provider]  VENTOLIN HFA 108 (90 Base) MCG/ACT inhaler Inhale 2 puffs into the lungs every 4 (four) hours as needed.  10/01/15   [provider]  vitamin E 400 UNIT capsule Apply 400 Units topically daily. Breaks open capsule and rubs on oil 08/26/17   [provider]    Family History Family History  Problem Relation Age of Onset  . Colon cancer Father   . Colon cancer Paternal Grandfather   . Colon cancer Maternal Grandfather   . Bone cancer Maternal Uncle   . Colon polyps Neg Hx   . Esophageal cancer Neg Hx   . Rectal cancer Neg Hx   . Stomach cancer Neg Hx     Social History Social History   Tobacco Use  . Smoking status: Never Smoker  . Smokeless tobacco: Never Used  Substance Use Topics  . Alcohol use: Yes    Alcohol/week: 0.0 standard drinks    Comment: rarely  . Drug use: No     Allergies   Patient has no known allergies.   Review  of Systems Review of Systems  Respiratory: Negative for shortness of breath.   Cardiovascular: Negative for chest pain.  Gastrointestinal: Negative for abdominal pain.  Neurological: Negative for headaches.  All other systems reviewed and are negative.    Physical Exam Updated Vital Signs BP (!) 143/76 (BP Location:  Left Arm)   Pulse 87   Temp 98.9 F (37.2 C) (Oral)   SpO2 100%   Physical Exam Vitals signs and nursing note reviewed.  Constitutional:      General: She is not in acute distress.    Appearance: Normal appearance. She is well-developed.  HENT:     Head: Normocephalic and atraumatic.  Eyes:     Conjunctiva/sclera: Conjunctivae normal.     Pupils: Pupils are equal, round, and reactive to light.  Neck:     Musculoskeletal: Normal range of motion and neck supple.  Cardiovascular:     Rate and Rhythm: Normal rate and regular rhythm.     Heart sounds: Normal heart sounds.  Pulmonary:     Effort: Pulmonary effort is normal. No respiratory distress.     Breath sounds: Normal breath sounds.  Abdominal:     General: There is no distension.     Palpations: Abdomen is soft.     Tenderness: There is no abdominal tenderness.  Musculoskeletal: Normal range of motion.        General: No deformity.  Skin:    General: Skin is warm and dry.     Comments: 2 cm laceration to the anterior aspect of the left knee.   Full AROM of the left knee.  Knee is stable without deformity.   Neurological:     Mental Status: She is alert and oriented to person, place, and time.     Comments: GCS 15 Normal speech No facial droop      ED Treatments / Results  Labs (all labs ordered are listed, but only abnormal results are displayed) Labs Reviewed - No data to display  EKG None  Radiology Dg Knee 2 Views Left  Result Date: 10/11/2018 CLINICAL DATA:  68 year old female with a fall EXAM: LEFT KNEE - 1-2 VIEW COMPARISON:  04/27/2015 FINDINGS: No acute displaced fracture.  Medial greater than lateral joint space narrowing with marginal osteophyte formation. Lateral view demonstrates soft tissue defect overlying the patella with no radiopaque foreign body. No patellar fracture identified. No joint effusion. IMPRESSION: Negative for acute bony abnormality. Soft tissue defect evident overlying the patella. Tricompartmental osteoarthritis Electronically Signed   By: Corrie Mckusick D.O.   On: 10/11/2018 18:29   Ct Head Wo Contrast  Result Date: 10/11/2018 CLINICAL DATA:  68 year old female with a fall EXAM: CT HEAD WITHOUT CONTRAST TECHNIQUE: Contiguous axial images were obtained from the base of the skull through the vertex without intravenous contrast. COMPARISON:  None. FINDINGS: Brain: No acute intracranial hemorrhage. No midline shift or mass effect. Gray-white differentiation maintained. Unremarkable appearance of the ventricular system. Vascular: Unremarkable. Skull: No acute fracture.  No aggressive bone lesion identified. Sinuses/Orbits: Air-fluid level of this left sphenoid sinus. Marginal sinus wall sclerosis. Remainder of the paranasal sinuses are clear. Unremarkable orbits. Other: None IMPRESSION: Negative for acute intracranial abnormality. Left sphenoid sinus disease, likely chronic. Electronically Signed   By: Corrie Mckusick D.O.   On: 10/11/2018 18:24    Procedures .Marland KitchenLaceration Repair Date/Time: 10/11/2018 6:48 PM Performed by: Valarie Merino, MD Authorized by: Valarie Merino, MD   Consent:    Consent obtained:  Verbal   Consent given by:  Patient   Risks discussed:  Infection, need for additional repair, nerve damage, pain, poor cosmetic result, poor wound healing, retained foreign body, tendon damage and vascular damage   Alternatives discussed:  No treatment Anesthesia (see MAR for exact dosages):    Anesthesia method:  Local infiltration  Local anesthetic:  Lidocaine 1% w/o epi Laceration details:    Location:  Leg   Leg location:  L knee    Length (cm):  1 Repair type:    Repair type:  Simple Pre-procedure details:    Preparation:  Patient was prepped and draped in usual sterile fashion and imaging obtained to evaluate for foreign bodies Exploration:    Hemostasis achieved with:  Direct pressure   Wound exploration: wound explored through full range of motion and entire depth of wound probed and visualized     Wound extent: no foreign bodies/material noted, no muscle damage noted, no tendon damage noted, no underlying fracture noted and no vascular damage noted     Contaminated: no   Treatment:    Area cleansed with:  Saline   Amount of cleaning:  Extensive   Irrigation solution:  Sterile saline   Irrigation method:  Pressure wash and syringe Skin repair:    Repair method:  Sutures   Suture size:  4-0   Suture material:  Prolene   Suture technique:  Running   Number of sutures:  6 Approximation:    Approximation:  Close Post-procedure details:    Dressing:  Sterile dressing   Patient tolerance of procedure:  Tolerated well, no immediate complications   (including critical care time)  Medications Ordered in ED Medications  lidocaine (PF) (XYLOCAINE) 1 % injection 10 mL (10 mLs Infiltration Given by Other 10/11/18 1742)     Initial Impression / Assessment and Plan / ED Course  I have reviewed the triage vital signs and the nursing notes.  Pertinent labs & imaging results that were available during my care of the patient were reviewed by me and considered in my medical decision making (see chart for details).        MDM  Screen complete  [name] was evaluated in Emergency Department on 10/11/2018 for the symptoms described in the history of present illness. She was evaluated in the context of the global COVID-19 pandemic, which necessitated consideration that the patient might be at risk for infection with the SARS-CoV-2 virus that causes COVID-19. Institutional protocols and algorithms that  pertain to the evaluation of patients at risk for COVID-19 are in a state of rapid change based on information released by regulatory bodies including the CDC and federal and state organizations. These policies and algorithms were followed during the patient's care in the ED.  Patient is presenting for evaluation following reported fall.  Patient's fall was mechanical in nature as she was navigating a flight of steps.  She struck her head.  She also has a laceration to the anterior aspect of the left knee.  Her imaging studies are without significant abnormality.  Her left knee laceration was closed without difficulty.  Patient does feel improved following her evaluation.  Importance of close follow-up is stressed.  Strict return precautions given and understood.  Final Clinical Impressions(s) / ED Diagnoses   Final diagnoses:  Fall, initial encounter  Laceration of left knee, initial encounter    ED Discharge Orders    None       Valarie Merino, MD 10/11/18 1850

## 2018-10-11 NOTE — ED Notes (Signed)
Patient in radiology

## 2018-10-11 NOTE — Discharge Instructions (Signed)
Please return for any problem.  Follow-up with your regular care provider as instructed.  The suture in your left knee needs to be removed in 10 days.

## 2018-10-11 NOTE — ED Triage Notes (Signed)
Pt fell when lost balance due to knees giving out on her. Pt has left knee pains. Knee wrapped at this time and bleeding controled. Reports did her it posterior head but denies LOC or taking blood thinners.

## 2018-10-12 NOTE — TOC Transition Note (Signed)
Transition of Care [facility]) - CM/SW Discharge Note   Patient Details  Name: [name] MRN: [id number] Date of Birth: [birth year]  [facility] CM/SW Contact:  Erenest Rasher, RN Phone Number: 10/12/2018, 6:34 PM   Clinical Narrative:    10/11/2018 at 530 pm late note. Pt was provided with a RW for home. Placed facesheet in [facility] DME room and pt signed documents of receipt. Will provide to DME rep, [name] for billing. Whitney Finner RN CCM Case Mgmt phone [phone]

## 2019-01-24 ENCOUNTER — Other Ambulatory Visit: Payer: Self-pay | Admitting: Family

## 2019-01-24 ENCOUNTER — Ambulatory Visit
Admission: RE | Admit: 2019-01-24 | Discharge: 2019-01-24 | Disposition: A | Discharge status: Home / Self Care | Payer: Medicare Other | Source: Ambulatory Visit | Attending: Family | Admitting: Family

## 2019-01-24 ENCOUNTER — Other Ambulatory Visit: Payer: Self-pay

## 2019-01-24 DIAGNOSIS — M79605 Pain in left leg: Secondary | ICD-10-CM

## 2019-02-16 ENCOUNTER — Other Ambulatory Visit: Payer: Self-pay

## 2019-02-16 ENCOUNTER — Encounter (HOSPITAL_COMMUNITY): Payer: Self-pay | Admitting: Emergency Medicine

## 2019-02-16 ENCOUNTER — Emergency Department (HOSPITAL_COMMUNITY)
Admission: EM | Admit: 2019-02-16 | Discharge: 2019-02-16 | Disposition: A | Discharge status: Home / Self Care | Payer: Medicare Other | Attending: Emergency Medicine | Admitting: Emergency Medicine

## 2019-02-16 DIAGNOSIS — Z794 Long term (current) use of insulin: Secondary | ICD-10-CM | POA: Diagnosis not present

## 2019-02-16 DIAGNOSIS — Y939 Activity, unspecified: Secondary | ICD-10-CM | POA: Diagnosis not present

## 2019-02-16 DIAGNOSIS — Y92003 Bedroom of unspecified non-institutional (private) residence as the place of occurrence of the external cause: Secondary | ICD-10-CM | POA: Insufficient documentation

## 2019-02-16 DIAGNOSIS — R739 Hyperglycemia, unspecified: Secondary | ICD-10-CM

## 2019-02-16 DIAGNOSIS — I10 Essential (primary) hypertension: Secondary | ICD-10-CM | POA: Diagnosis not present

## 2019-02-16 DIAGNOSIS — M25552 Pain in left hip: Secondary | ICD-10-CM | POA: Diagnosis not present

## 2019-02-16 DIAGNOSIS — M25562 Pain in left knee: Secondary | ICD-10-CM | POA: Diagnosis not present

## 2019-02-16 DIAGNOSIS — R350 Frequency of micturition: Secondary | ICD-10-CM | POA: Insufficient documentation

## 2019-02-16 DIAGNOSIS — Y999 Unspecified external cause status: Secondary | ICD-10-CM | POA: Diagnosis not present

## 2019-02-16 DIAGNOSIS — E1165 Type 2 diabetes mellitus with hyperglycemia: Secondary | ICD-10-CM | POA: Diagnosis not present

## 2019-02-16 DIAGNOSIS — Z79899 Other long term (current) drug therapy: Secondary | ICD-10-CM | POA: Insufficient documentation

## 2019-02-16 DIAGNOSIS — W06XXXA Fall from bed, initial encounter: Secondary | ICD-10-CM | POA: Diagnosis not present

## 2019-02-16 LAB — BASIC METABOLIC PANEL
Anion gap: 10 (ref 5–15)
BUN: 34 mg/dL — ABNORMAL HIGH (ref 8–23)
CO2: 22 mmol/L (ref 22–32)
Calcium: 8.9 mg/dL (ref 8.9–10.3)
Chloride: 99 mmol/L (ref 98–111)
Creatinine, Ser: 1.76 mg/dL — ABNORMAL HIGH (ref 0.44–1.00)
GFR calc Af Amer: 35 mL/min — ABNORMAL LOW (ref >60)
GFR calc non Af Amer: 30 mL/min — ABNORMAL LOW (ref >60)
Glucose, Bld: 510 mg/dL (ref 70–99)
Potassium: 5.6 mmol/L — ABNORMAL HIGH (ref 3.5–5.1)
Sodium: 131 mmol/L — ABNORMAL LOW (ref 135–145)

## 2019-02-16 LAB — CBC WITH DIFFERENTIAL/PLATELET
Abs Immature Granulocytes: 0.01 10*3/uL (ref 0.00–0.07)
Basophils Absolute: 0.1 10*3/uL (ref 0.0–0.1)
Basophils Relative: 1 %
Eosinophils Absolute: 0.5 10*3/uL (ref 0.0–0.5)
Eosinophils Relative: 8 %
HCT: 29.3 % — ABNORMAL LOW (ref 36.0–46.0)
Hemoglobin: 8.7 g/dL — ABNORMAL LOW (ref 12.0–15.0)
Immature Granulocytes: 0 %
Lymphocytes Relative: 21 %
Lymphs Abs: 1.2 10*3/uL (ref 0.7–4.0)
MCH: 24 pg — ABNORMAL LOW (ref 26.0–34.0)
MCHC: 29.7 g/dL — ABNORMAL LOW (ref 30.0–36.0)
MCV: 80.7 fL (ref 80.0–100.0)
Monocytes Absolute: 0.7 10*3/uL (ref 0.1–1.0)
Monocytes Relative: 11 %
Neutro Abs: 3.6 10*3/uL (ref 1.7–7.7)
Neutrophils Relative %: 59 %
Platelets: 128 10*3/uL — ABNORMAL LOW (ref 150–400)
RBC: 3.63 MIL/uL — ABNORMAL LOW (ref 3.87–5.11)
RDW: 17.8 % — ABNORMAL HIGH (ref 11.5–15.5)
WBC: 6 10*3/uL (ref 4.0–10.5)
nRBC: 0 % (ref 0.0–0.2)

## 2019-02-16 LAB — CBG MONITORING, ED
Glucose-Capillary: 498 mg/dL — ABNORMAL HIGH (ref 70–99)
Glucose-Capillary: 378 mg/dL — ABNORMAL HIGH (ref 70–99)
Glucose-Capillary: 285 mg/dL — ABNORMAL HIGH (ref 70–99)

## 2019-02-16 LAB — BLOOD GAS, VENOUS
Acid-base deficit: 3 mmol/L — ABNORMAL HIGH (ref 0.0–2.0)
Bicarbonate: 24 mmol/L (ref 20.0–28.0)
O2 Saturation: 45.5 %
Patient temperature: 98.6
pCO2, Ven: 50 mmHg (ref 44.0–60.0)
pH, Ven: 7.302 (ref 7.250–7.430)
pO2, Ven: 31.9 mmHg — CL (ref 32.0–45.0)

## 2019-02-16 MED ORDER — SODIUM CHLORIDE 0.9 % IV BOLUS
500.0000 mL | Freq: Once | INTRAVENOUS | Status: AC
  Administered 2019-02-16: 500 mL via INTRAVENOUS

## 2019-02-16 MED ORDER — INSULIN ASPART 100 UNIT/ML ~~LOC~~ SOLN
4.0000 [IU] | Freq: Once | SUBCUTANEOUS | Status: AC
  Administered 2019-02-16: 4 [IU] via INTRAVENOUS
  Filled 2019-02-16: qty 0.04

## 2019-02-16 NOTE — ED Notes (Signed)
Date and time results received: 02/16/19 3:19 PM  (use smartphrase ".now" to insert current time)  Test: Glucose Critical Value: 510  Name of Provider Notified: awaiting MD  Orders Received? Or Actions Taken?: awaiting orders

## 2019-02-16 NOTE — ED Notes (Addendum)
error 

## 2019-02-16 NOTE — ED Provider Notes (Signed)
[facility] DEPT Provider Note   CSN: [id number] Arrival date & time: 02/16/19  1344     History   Chief Complaint Chief Complaint  Patient presents with   Fall   Hyperglycemia   Knee Pain    HPI [name] is a 68 y.o. female.     HPI Patient presents with high blood sugar and after fall.  States that she fell out of bed earlier today.  No injury with pain.  Mild left knee and also has hip pain that she states is unchanged from her chronic pain.  No chest pain.  Hyperglycemia.  States sugars were 500.  States that they were a little bit high yesterday, in the 400s.  States she still takes her metformin and sounds like an insulin.  States she ate some cake yesterday and she thinks that is why it is high.  States she has had some urinary frequency.  She states after the fall out of bed she had difficulty getting up, which is not unusual for her. Past Medical History:  Diagnosis Date   Arthritis    Diabetes mellitus without complication (HCC)    GERD (gastroesophageal reflux disease)    H/O hiatal hernia    Headache(784.0)    Hyperlipidemia    Hypertension    Neuromuscular disorder (HCC)    neuropathy feet    Sleep apnea    recently diagnosed- wears cpap    Patient Active Problem List   Diagnosis Date Noted   Acute bronchiolitis 10/10/2017   Shortness of breath 10/21/2015   Acute bronchitis and bronchiolitis 10/21/2015   Lactic acidosis 10/21/2015   Benign essential HTN 10/21/2015   GERD (gastroesophageal reflux disease) 10/21/2015   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   Acute renal failure superimposed on stage 3 chronic kidney disease (HCC) 10/21/2015   Chronic arthritis 10/21/2015   SOB (shortness of breath) 10/21/2015   OSA (obstructive sleep apnea) 10/11/2012   Rotator cuff tear, non-traumatic 08/25/2012    Past Surgical History:  Procedure Laterality Date   ABDOMINAL  HYSTERECTOMY     partial   BACK SURGERY     CHOLECYSTECTOMY     COLONOSCOPY     age 52- doesnt know where or who    left shoulder     x 2 surgeries   right shoulder     x2   SHOULDER OPEN ROTATOR CUFF REPAIR Right 08/25/2012   Procedure: ROTATOR CUFF REPAIR SHOULDER OPEN WITH ACROMIOPLASTY;  Surgeon: Tobi Bastos, MD;  Location: [facility];  Service: Orthopedics;  Laterality: Right;   TONSILLECTOMY       OB History    Gravida  4   Para  3   Term  3   Preterm  0   AB  1   Living  3     SAB  0   TAB  1   Ectopic  0   Multiple  0   Live Births  3            Home Medications    Prior to Admission medications   Medication Sig Start Date End Date Taking? Authorizing Provider  amLODipine (NORVASC) 5 MG tablet Take 5 mg by mouth daily.   Yes [provider]  atorvastatin (LIPITOR) 10 MG tablet Take 10 mg by mouth daily at 6 PM.   Yes [provider]  cetirizine (ZYRTEC) 10 MG tablet Take 10 mg by mouth daily.  08/31/18  Yes  [provider]  cyclobenzaprine (FLEXERIL) 5 MG tablet Take 5 mg by mouth at bedtime.  01/17/19  Yes [provider]  diclofenac sodium (VOLTAREN) 1 % GEL Apply 2 g topically daily.  01/17/19  Yes [provider]  DULoxetine (CYMBALTA) 20 MG capsule Take 20 mg by mouth daily.  01/03/19  Yes [provider]  furosemide (LASIX) 20 MG tablet Take 20 mg by mouth daily.  09/13/15  Yes [provider]  glimepiride (AMARYL) 4 MG tablet Take 4 mg by mouth daily with breakfast.  11/23/18  Yes [provider]  ipratropium-albuterol (DUONEB) 0.5-2.5 (3) MG/3ML SOLN Take 3 mLs by nebulization 2 (two) times daily. Patient taking differently: Take 3 mLs by nebulization every 6 (six) hours as needed (sob and wheezing).  10/23/15  Yes Barton Dubois, MD  LANTUS SOLOSTAR 100 UNIT/ML Solostar Pen Inject 30 Units into the skin at bedtime.  01/07/19  Yes [provider]  lisinopril  (ZESTRIL) 40 MG tablet Take 40 mg by mouth daily.  12/22/18  Yes [provider]  losartan (COZAAR) 25 MG tablet Take 1 tablet (25 mg total) by mouth daily. 10/23/15  Yes Barton Dubois, MD  LYRICA 200 MG capsule Take 200 mg by mouth 3 (three) times daily. 10/08/17  Yes [provider]  metFORMIN (GLUCOPHAGE) 1000 MG tablet Take 1,000 mg by mouth 2 (two) times daily. 07/19/17  Yes [provider]  omeprazole (PRILOSEC) 20 MG capsule Take 20 mg by mouth daily.  10/05/14  Yes [provider]  oxycodone (ROXICODONE) 30 MG immediate release tablet Take 30 mg by mouth every 6 (six) hours as needed for pain.  01/17/19  Yes [provider]  oxyCODONE-acetaminophen (PERCOCET) 10-325 MG tablet Take 1 tablet by mouth every 6 (six) hours as needed for pain.  01/21/19  Yes [provider]  tiZANidine (ZANAFLEX) 4 MG tablet Take 4 mg by mouth daily.  10/17/14  Yes [provider]  VENTOLIN HFA 108 (90 Base) MCG/ACT inhaler Inhale 2 puffs into the lungs every 4 (four) hours as needed for wheezing or shortness of breath.  10/01/15  Yes [provider]  ACCU-CHEK AVIVA PLUS test strip USE BID FOR GLUCOSE TESTING 12/19/14   [provider]    Family History Family History  Problem Relation Age of Onset   Colon cancer Father    Colon cancer Paternal Grandfather    Colon cancer Maternal Grandfather    Bone cancer Maternal Uncle    Colon polyps Neg Hx    Esophageal cancer Neg Hx    Rectal cancer Neg Hx    Stomach cancer Neg Hx     Social History Social History   Tobacco Use   Smoking status: Never Smoker   Smokeless tobacco: Never Used  Substance Use Topics   Alcohol use: Yes    Alcohol/week: 0.0 standard drinks    Comment: rarely   Drug use: No     Allergies   Patient has no known allergies.   Review of Systems Review of Systems  Constitutional: Positive for fatigue. Negative for appetite change and fever.  HENT:  Negative for congestion.   Respiratory: Negative for shortness of breath.   Cardiovascular: Negative for chest pain.  Gastrointestinal: Negative for abdominal distention.  Endocrine: Positive for polyuria.  Genitourinary: Negative for flank pain.  Musculoskeletal: Negative for back pain.  Skin: Negative for wound.  Neurological: Positive for weakness.  Psychiatric/Behavioral: Negative for confusion.     Physical Exam  Updated Vital Signs BP 116/70 (BP Location: Right Arm)    Pulse 80    Temp 98.5 F (36.9 C) (Oral)    Resp 14    Ht 5\' 3"  (1.6 m)    Wt 51.3 kg    SpO2 100%    BMI 20.02 kg/m   Physical Exam Vitals signs and nursing note reviewed.  HENT:     Head: Normocephalic.  Eyes:     Pupils: Pupils are equal, round, and reactive to light.  Cardiovascular:     Rate and Rhythm: Regular rhythm.  Pulmonary:     Effort: Pulmonary effort is normal.  Abdominal:     Tenderness: There is no abdominal tenderness.  Musculoskeletal:     Comments: Good range of motion in left hip and left knee without frank tenderness.  Skin:    Capillary Refill: Capillary refill takes less than 2 seconds.  Neurological:     General: No focal deficit present.     Mental Status: She is alert.      ED Treatments / Results  Labs (all labs ordered are listed, but only abnormal results are displayed) Labs Reviewed  CBC WITH DIFFERENTIAL/PLATELET - Abnormal; Notable for the following components:      Result Value   RBC 3.63 (*)    Hemoglobin 8.7 (*)    HCT 29.3 (*)    MCH 24.0 (*)    MCHC 29.7 (*)    RDW 17.8 (*)    Platelets 128 (*)    All other components within normal limits  BASIC METABOLIC PANEL - Abnormal; Notable for the following components:   Sodium 131 (*)    Potassium 5.6 (*)    Glucose, Bld 510 (*)    BUN 34 (*)    Creatinine, Ser 1.76 (*)    GFR calc non Af Amer 30 (*)    GFR calc Af Amer 35 (*)    All other components within normal limits  BLOOD GAS, VENOUS - Abnormal;  Notable for the following components:   pO2, Ven 31.9 (*)    Acid-base deficit 3.0 (*)    All other components within normal limits  CBG MONITORING, ED - Abnormal; Notable for the following components:   Glucose-Capillary 498 (*)    All other components within normal limits  CBG MONITORING, ED - Abnormal; Notable for the following components:   Glucose-Capillary 378 (*)    All other components within normal limits  CBG MONITORING, ED - Abnormal; Notable for the following components:   Glucose-Capillary 285 (*)    All other components within normal limits    EKG EKG Interpretation  Date/Time:  Wednesday February 16 2019 14:17:27 EDT Ventricular Rate:  73 PR Interval:    QRS Duration: 75 QT Interval:  378 QTC Calculation: 417 R Axis:   29 Text Interpretation:  Sinus rhythm Confirmed by Davonna Belling [phone]) on 02/16/2019 4:00:28 PM   Radiology No results found.  Procedures Procedures (including critical care time)  Medications Ordered in ED Medications  sodium chloride 0.9 % bolus 500 mL (500 mLs Intravenous New Bag/Given 02/16/19 1759)  insulin aspart (novoLOG) injection 4 Units (4 Units Intravenous Given 02/16/19 1758)     Initial Impression / Assessment and Plan / ED Course  I have reviewed the triage vital signs and the nursing notes.  Pertinent labs & imaging results that were available during my care of the patient were reviewed by me and considered in my medical decision making (see chart for details).  Patient presents after fall.  No apparent injury.  However it was hyperglycemic at 500.  Not in DKA.  Sugar came down with fluids and IV insulin.  Has more medicines at home.  Outpatient follow-up PCP.  Final Clinical Impressions(s) / ED Diagnoses   Final diagnoses:  Hyperglycemia    ED Discharge Orders    None       Davonna Belling, MD 02/16/19 [phone]

## 2019-02-16 NOTE — ED Triage Notes (Signed)
Patient has been non-complaint with her medication. Today she woke at about 0700 and fell. She now has left knee pain she rates an 8/10. Her home health RN saw her today and called 911 due to high blood glucose. She has not taken any medication today and has not eaten since about 2100 last night.   EMS vitals and CBG: 98/53 BP 97.4 Temp 80 HR 96% O2 sat on room air 16 RR  493 CBG

## 2019-03-20 ENCOUNTER — Emergency Department (HOSPITAL_COMMUNITY)
Admission: EM | Admit: 2019-03-20 | Discharge: 2019-03-20 | Disposition: A | Discharge status: Home / Self Care | Payer: Medicare Other | Attending: Emergency Medicine | Admitting: Emergency Medicine

## 2019-03-20 ENCOUNTER — Other Ambulatory Visit: Payer: Self-pay

## 2019-03-20 ENCOUNTER — Emergency Department (HOSPITAL_COMMUNITY): Payer: Medicare Other

## 2019-03-20 DIAGNOSIS — R110 Nausea: Secondary | ICD-10-CM | POA: Insufficient documentation

## 2019-03-20 DIAGNOSIS — R42 Dizziness and giddiness: Secondary | ICD-10-CM | POA: Diagnosis present

## 2019-03-20 DIAGNOSIS — I10 Essential (primary) hypertension: Secondary | ICD-10-CM | POA: Insufficient documentation

## 2019-03-20 DIAGNOSIS — R739 Hyperglycemia, unspecified: Secondary | ICD-10-CM

## 2019-03-20 DIAGNOSIS — Z7984 Long term (current) use of oral hypoglycemic drugs: Secondary | ICD-10-CM | POA: Diagnosis not present

## 2019-03-20 DIAGNOSIS — Z03818 Encounter for observation for suspected exposure to other biological agents ruled out: Secondary | ICD-10-CM

## 2019-03-20 DIAGNOSIS — Z79899 Other long term (current) drug therapy: Secondary | ICD-10-CM | POA: Diagnosis not present

## 2019-03-20 DIAGNOSIS — Z20828 Contact with and (suspected) exposure to other viral communicable diseases: Secondary | ICD-10-CM | POA: Diagnosis not present

## 2019-03-20 DIAGNOSIS — E1165 Type 2 diabetes mellitus with hyperglycemia: Secondary | ICD-10-CM | POA: Insufficient documentation

## 2019-03-20 DIAGNOSIS — Z20822 Contact with and (suspected) exposure to covid-19: Secondary | ICD-10-CM

## 2019-03-20 DIAGNOSIS — R112 Nausea with vomiting, unspecified: Secondary | ICD-10-CM

## 2019-03-20 LAB — COMPREHENSIVE METABOLIC PANEL
ALT: 26 U/L (ref 0–44)
AST: 23 U/L (ref 15–41)
Albumin: 3.5 g/dL (ref 3.5–5.0)
Alkaline Phosphatase: 85 U/L (ref 38–126)
Anion gap: 11 (ref 5–15)
BUN: 20 mg/dL (ref 8–23)
CO2: 19 mmol/L — ABNORMAL LOW (ref 22–32)
Calcium: 9.1 mg/dL (ref 8.9–10.3)
Chloride: 107 mmol/L (ref 98–111)
Creatinine, Ser: 0.93 mg/dL (ref 0.44–1.00)
GFR calc Af Amer: >60 mL/min (ref >60)
GFR calc non Af Amer: >60 mL/min (ref >60)
Glucose, Bld: 292 mg/dL — ABNORMAL HIGH (ref 70–99)
Potassium: 4.4 mmol/L (ref 3.5–5.1)
Sodium: 137 mmol/L (ref 135–145)
Total Bilirubin: 0.9 mg/dL (ref 0.3–1.2)
Total Protein: 7 g/dL (ref 6.5–8.1)

## 2019-03-20 LAB — URINALYSIS, ROUTINE W REFLEX MICROSCOPIC
Bacteria, UA: NONE SEEN
Bilirubin Urine: NEGATIVE
Glucose, UA: >=500 mg/dL — AB
Hgb urine dipstick: NEGATIVE
Ketones, ur: 5 mg/dL — AB
Nitrite: NEGATIVE
Protein, ur: 30 mg/dL — AB
Specific Gravity, Urine: 1.025 (ref 1.005–1.030)
pH: 5 (ref 5.0–8.0)

## 2019-03-20 LAB — CBC WITH DIFFERENTIAL/PLATELET
Abs Immature Granulocytes: 0.01 10*3/uL (ref 0.00–0.07)
Basophils Absolute: 0 10*3/uL (ref 0.0–0.1)
Basophils Relative: 1 %
Eosinophils Absolute: 0.3 10*3/uL (ref 0.0–0.5)
Eosinophils Relative: 5 %
HCT: 32.4 % — ABNORMAL LOW (ref 36.0–46.0)
Hemoglobin: 9.3 g/dL — ABNORMAL LOW (ref 12.0–15.0)
Immature Granulocytes: 0 %
Lymphocytes Relative: 19 %
Lymphs Abs: 1 10*3/uL (ref 0.7–4.0)
MCH: 23 pg — ABNORMAL LOW (ref 26.0–34.0)
MCHC: 28.7 g/dL — ABNORMAL LOW (ref 30.0–36.0)
MCV: 80.2 fL (ref 80.0–100.0)
Monocytes Absolute: 0.4 10*3/uL (ref 0.1–1.0)
Monocytes Relative: 8 %
Neutro Abs: 3.3 10*3/uL (ref 1.7–7.7)
Neutrophils Relative %: 67 %
Platelets: 126 10*3/uL — ABNORMAL LOW (ref 150–400)
RBC: 4.04 MIL/uL (ref 3.87–5.11)
RDW: 17.3 % — ABNORMAL HIGH (ref 11.5–15.5)
WBC: 4.9 10*3/uL (ref 4.0–10.5)
nRBC: 0 % (ref 0.0–0.2)

## 2019-03-20 LAB — LIPASE, BLOOD: Lipase: 19 U/L (ref 11–51)

## 2019-03-20 LAB — CBG MONITORING, ED: Glucose-Capillary: 256 mg/dL — ABNORMAL HIGH (ref 70–99)

## 2019-03-20 LAB — SARS CORONAVIRUS 2 BY RT PCR (HOSPITAL ORDER, PERFORMED IN ~~LOC~~ HOSPITAL LAB): SARS Coronavirus 2: NEGATIVE

## 2019-03-20 MED ORDER — ONDANSETRON HCL 4 MG PO TABS: 4.0000 mg | ORAL_TABLET | Freq: Four times a day (QID) | ORAL | 0 refills | Status: DC

## 2019-03-20 MED ORDER — ONDANSETRON HCL 4 MG/2ML IJ SOLN
4.0000 mg | Freq: Once | INTRAMUSCULAR | Status: AC
  Administered 2019-03-20: 4 mg via INTRAVENOUS
  Filled 2019-03-20: qty 2

## 2019-03-20 MED ORDER — SODIUM CHLORIDE 0.9 % IV BOLUS
1000.0000 mL | Freq: Once | INTRAVENOUS | Status: AC
  Administered 2019-03-20: 1000 mL via INTRAVENOUS

## 2019-03-20 NOTE — ED Notes (Signed)
Pt ambulated to restroom without incident.

## 2019-03-20 NOTE — ED Triage Notes (Signed)
Pt to ED via EMS from home. Pt c/o dizziness, and GI upset. Reports recent travel to [place]. Pt family worried pt has COVID. Pt not complaining of pain. NSR, #[id number]. No medications given by EMS. No orthostatic changes noted by EMS A&O x 4.

## 2019-03-20 NOTE — Discharge Instructions (Signed)
Take Zofran as needed for nausea.  Please keep a close watch on your sugars.  Follow-up with your primary care provider in 2 days for continued evaluation.  Return to the ER immediately for new or worsening symptoms or concerns, such as uncontrolled vomiting, abdominal pain, fevers or any concerns at all.

## 2019-03-20 NOTE — ED Provider Notes (Signed)
[facility] DEPT Provider Note   CSN: [id number] Arrival date & time: 03/20/19  1246     History   Chief Complaint Chief Complaint  Patient presents with  . Near Syncope    possible COVID?    HPI [name] is a 68 y.o. female.     HPI   68 year old female, with a PMH of diabetes, GERD, HTN, HLD, presents with nausea and diarrhea for the last week.  Patient states that symptoms worsened this morning with dizziness and 1 episode of vomiting.  She notes some mild abdominal discomfort.  She denies any fevers, hematemesis, hematochezia, melena.  Patient does note that in the morning she has a dry cough and clears some "clear mucus" and then her cough resolves.  She denies any chest pain or shortness of breath.  She traveled to [place] in the middle of September and returned approximately 2 weeks ago.  She denies any known coronavirus contact.   Past Medical History:  Diagnosis Date  . Arthritis   . Diabetes mellitus without complication ([place])   . GERD (gastroesophageal reflux disease)   . H/O hiatal hernia   . Headache(784.0)   . Hyperlipidemia   . Hypertension   . Neuromuscular disorder (HCC)    neuropathy feet   . Sleep apnea    recently diagnosed- wears cpap    Patient Active Problem List   Diagnosis Date Noted  . Acute bronchiolitis 10/10/2017  . Shortness of breath 10/21/2015  . Acute bronchitis and bronchiolitis 10/21/2015  . Lactic acidosis 10/21/2015  . Benign essential HTN 10/21/2015  . GERD (gastroesophageal reflux disease) 10/21/2015  . Diabetes mellitus with nephropathy ([place]) 10/21/2015  . Acute renal failure superimposed on stage 3 chronic kidney disease ([place]) 10/21/2015  . Chronic arthritis 10/21/2015  . SOB (shortness of breath) 10/21/2015  . OSA (obstructive sleep apnea) 10/11/2012  . Rotator cuff tear, non-traumatic 08/25/2012    Past Surgical History:  Procedure Laterality Date  . ABDOMINAL  HYSTERECTOMY     partial  . BACK SURGERY    . CHOLECYSTECTOMY    . COLONOSCOPY     age 36- doesnt know where or who   . left shoulder     x 2 surgeries  . right shoulder     x2  . SHOULDER OPEN ROTATOR CUFF REPAIR Right 08/25/2012   Procedure: ROTATOR CUFF REPAIR SHOULDER OPEN WITH ACROMIOPLASTY;  Surgeon: Tobi Bastos, MD;  Location: [facility];  Service: Orthopedics;  Laterality: Right;  . TONSILLECTOMY       OB History    Gravida  4   Para  3   Term  3   Preterm  0   AB  1   Living  3     SAB  0   TAB  1   Ectopic  0   Multiple  0   Live Births  3            Home Medications    Prior to Admission medications   Medication Sig Start Date End Date Taking? Authorizing Provider  ACCU-CHEK AVIVA PLUS test strip USE BID FOR GLUCOSE TESTING 12/19/14   [provider]  amLODipine (NORVASC) 5 MG tablet Take 5 mg by mouth daily.    [provider]  atorvastatin (LIPITOR) 10 MG tablet Take 10 mg by mouth daily at 6 PM.    [provider]  cetirizine (ZYRTEC) 10 MG tablet Take 10 mg by mouth daily.  08/31/18   [provider]  cyclobenzaprine (FLEXERIL) 5 MG tablet Take 5 mg by mouth at bedtime.  01/17/19   [provider]  diclofenac sodium (VOLTAREN) 1 % GEL Apply 2 g topically daily.  01/17/19   [provider]  DULoxetine (CYMBALTA) 20 MG capsule Take 20 mg by mouth daily.  01/03/19   [provider]  furosemide (LASIX) 20 MG tablet Take 20 mg by mouth daily.  09/13/15   [provider]  glimepiride (AMARYL) 4 MG tablet Take 4 mg by mouth daily with breakfast.  11/23/18   [provider]  ipratropium-albuterol (DUONEB) 0.5-2.5 (3) MG/3ML SOLN Take 3 mLs by nebulization 2 (two) times daily. Patient taking differently: Take 3 mLs by nebulization every 6 (six) hours as needed (sob and wheezing).  10/23/15   Barton Dubois, MD  LANTUS SOLOSTAR 100 UNIT/ML Solostar Pen Inject 30 Units into the skin at  bedtime.  01/07/19   [provider]  lisinopril (ZESTRIL) 40 MG tablet Take 40 mg by mouth daily.  12/22/18   [provider]  losartan (COZAAR) 25 MG tablet Take 1 tablet (25 mg total) by mouth daily. 10/23/15   Barton Dubois, MD  LYRICA 200 MG capsule Take 200 mg by mouth 3 (three) times daily. 10/08/17   [provider]  metFORMIN (GLUCOPHAGE) 1000 MG tablet Take 1,000 mg by mouth 2 (two) times daily. 07/19/17   [provider]  omeprazole (PRILOSEC) 20 MG capsule Take 20 mg by mouth daily.  10/05/14   [provider]  oxycodone (ROXICODONE) 30 MG immediate release tablet Take 30 mg by mouth every 6 (six) hours as needed for pain.  01/17/19   [provider]  oxyCODONE-acetaminophen (PERCOCET) 10-325 MG tablet Take 1 tablet by mouth every 6 (six) hours as needed for pain.  01/21/19   [provider]  tiZANidine (ZANAFLEX) 4 MG tablet Take 4 mg by mouth daily.  10/17/14   [provider]  VENTOLIN HFA 108 (90 Base) MCG/ACT inhaler Inhale 2 puffs into the lungs every 4 (four) hours as needed for wheezing or shortness of breath.  10/01/15   [provider]    Family History Family History  Problem Relation Age of Onset  . Colon cancer Father   . Colon cancer Paternal Grandfather   . Colon cancer Maternal Grandfather   . Bone cancer Maternal Uncle   . Colon polyps Neg Hx   . Esophageal cancer Neg Hx   . Rectal cancer Neg Hx   . Stomach cancer Neg Hx     Social History Social History   Tobacco Use  . Smoking status: Never Smoker  . Smokeless tobacco: Never Used  Substance Use Topics  . Alcohol use: Yes    Alcohol/week: 0.0 standard drinks    Comment: rarely  . Drug use: No     Allergies   Patient has no known allergies.   Review of Systems Review of Systems  Constitutional: Negative for chills and fever.  HENT: Negative for rhinorrhea and sore throat.   Eyes: Negative for visual disturbance.  Respiratory:  Positive for cough. Negative for shortness of breath.   Cardiovascular: Negative for chest pain and leg swelling.  Gastrointestinal: Positive for abdominal pain, diarrhea, nausea and vomiting. Negative for blood in stool.  Genitourinary: Negative for dysuria, frequency and urgency.  Musculoskeletal: Negative for joint swelling and neck pain.  Skin: Negative for rash and wound.  Neurological: Negative for syncope and numbness.  All other systems reviewed and are negative.    Physical Exam Updated Vital Signs BP (!) 150/74 (BP Location: Right Arm)   Pulse 82   Temp 98.4 F (36.9 C) (Oral)   Resp 18   SpO2 100%   Physical Exam Vitals signs and nursing note reviewed.  Constitutional:      Appearance: She is well-developed.  HENT:     Head: Normocephalic and atraumatic.  Eyes:     Conjunctiva/sclera: Conjunctivae normal.  Neck:     Musculoskeletal: Neck supple.  Cardiovascular:     Rate and Rhythm: Normal rate and regular rhythm.     Heart sounds: Normal heart sounds. No murmur.  Pulmonary:     Effort: Pulmonary effort is normal. No respiratory distress.     Breath sounds: Normal breath sounds. No wheezing or rales.  Abdominal:     General: Bowel sounds are normal. There is no distension.     Palpations: Abdomen is soft.     Tenderness: There is no abdominal tenderness.  Musculoskeletal: Normal range of motion.        General: No tenderness or deformity.  Skin:    General: Skin is warm and dry.     Findings: No erythema or rash.  Neurological:     Mental Status: She is alert and oriented to person, place, and time.     Cranial Nerves: Cranial nerves are intact.     Sensory: Sensation is intact.     Motor: Motor function is intact.     Coordination: Coordination is intact.  Psychiatric:        Behavior: Behavior normal.      ED Treatments / Results  Labs (all labs ordered are listed, but only abnormal results are displayed) Labs Reviewed  SARS CORONAVIRUS 2  ([facility])  CBC WITH DIFFERENTIAL/PLATELET  COMPREHENSIVE METABOLIC PANEL  LIPASE, BLOOD  URINALYSIS, ROUTINE W REFLEX MICROSCOPIC    EKG None  Radiology No results found.  Procedures Procedures (including critical care time)  Medications Ordered in ED Medications  sodium chloride 0.9 % bolus 1,000 mL (has no administration in time range)     Initial Impression / Assessment and Plan / ED Course  I have reviewed the triage vital signs and the nursing notes.  Pertinent labs & imaging results that were available during my care of the patient were reviewed by me and considered in my medical decision making (see chart for details).        Patient presents with dizziness, nausea, 1 episode of vomiting and diarrhea.  Her abdomen is soft and nontender palpation.  Her neuro exam is nonfocal, nonlateralizing.  On arrival her vital signs are stable. She does note recent travel and would like to be tested for coronavirus.  Her blood work shows chronic anemia, stable compared to prior.  She denies any melena, hematochezia, hematemesis.  Her sugar is elevated and bicarb slightly low however she has no elevation in her anion gap.  Her blood work is not consistent with DKA.  She was given a liter of fluids and ate part of a sandwich.  She ambulated to the bathroom without difficulty.  She denies any dizziness.  She is COVID-19 negative.  Her chest x-ray shows no pneumonia, pneumothorax, pleural effusion.  She is feeling much better, ready and stable for discharge.   At this time there does not appear to be any evidence of an acute emergency medical condition and the patient appears  stable for discharge with appropriate outpatient follow up.Diagnosis was discussed with patient who verbalizes understanding and is agreeable to discharge. Pt case discussed with Dr. Sedonia Small who agrees with my plan.    [name] was evaluated in Emergency  Department on 03/20/2019 for the symptoms described in the history of present illness. She was evaluated in the context of the global COVID-19 pandemic, which necessitated consideration that the patient might be at risk for infection with the SARS-CoV-2 virus that causes COVID-19. Institutional protocols and algorithms that pertain to the evaluation of patients at risk for COVID-19 are in a state of rapid change based on information released by regulatory bodies including the CDC and federal and state organizations. These policies and algorithms were followed during the patient's care in the ED.  Final Clinical Impressions(s) / ED Diagnoses   Final diagnoses:  None    ED Discharge Orders    None       Rachel Moulds 03/20/19 2010    Maudie Flakes, MD 03/20/19 2119

## 2019-05-20 ENCOUNTER — Other Ambulatory Visit: Payer: Self-pay | Admitting: Family

## 2019-05-23 ENCOUNTER — Other Ambulatory Visit: Payer: Self-pay | Admitting: Family

## 2019-05-23 DIAGNOSIS — M79652 Pain in left thigh: Secondary | ICD-10-CM

## 2019-05-23 DIAGNOSIS — R5381 Other malaise: Secondary | ICD-10-CM

## 2019-05-25 ENCOUNTER — Other Ambulatory Visit: Payer: Self-pay | Admitting: Family

## 2019-05-25 DIAGNOSIS — M79659 Pain in unspecified thigh: Secondary | ICD-10-CM

## 2019-05-25 DIAGNOSIS — M79605 Pain in left leg: Secondary | ICD-10-CM

## 2019-05-30 ENCOUNTER — Encounter (HOSPITAL_COMMUNITY): Payer: Self-pay | Admitting: Emergency Medicine

## 2019-05-30 ENCOUNTER — Other Ambulatory Visit: Payer: Self-pay

## 2019-05-30 ENCOUNTER — Emergency Department (HOSPITAL_COMMUNITY)
Admission: EM | Admit: 2019-05-30 | Discharge: 2019-05-31 | Disposition: A | Discharge status: Home / Self Care | Payer: Medicare Other | Attending: Emergency Medicine | Admitting: Emergency Medicine

## 2019-05-30 DIAGNOSIS — S01412A Laceration without foreign body of left cheek and temporomandibular area, initial encounter: Secondary | ICD-10-CM

## 2019-05-30 DIAGNOSIS — Y9389 Activity, other specified: Secondary | ICD-10-CM | POA: Insufficient documentation

## 2019-05-30 DIAGNOSIS — E119 Type 2 diabetes mellitus without complications: Secondary | ICD-10-CM | POA: Diagnosis not present

## 2019-05-30 DIAGNOSIS — S0990XA Unspecified injury of head, initial encounter: Secondary | ICD-10-CM | POA: Diagnosis not present

## 2019-05-30 DIAGNOSIS — Z79899 Other long term (current) drug therapy: Secondary | ICD-10-CM | POA: Diagnosis not present

## 2019-05-30 DIAGNOSIS — I129 Hypertensive chronic kidney disease with stage 1 through stage 4 chronic kidney disease, or unspecified chronic kidney disease: Secondary | ICD-10-CM | POA: Diagnosis not present

## 2019-05-30 DIAGNOSIS — Z794 Long term (current) use of insulin: Secondary | ICD-10-CM | POA: Diagnosis not present

## 2019-05-30 DIAGNOSIS — W01198A Fall on same level from slipping, tripping and stumbling with subsequent striking against other object, initial encounter: Secondary | ICD-10-CM | POA: Diagnosis not present

## 2019-05-30 DIAGNOSIS — Y999 Unspecified external cause status: Secondary | ICD-10-CM | POA: Diagnosis not present

## 2019-05-30 DIAGNOSIS — N183 Chronic kidney disease, stage 3 unspecified: Secondary | ICD-10-CM | POA: Diagnosis not present

## 2019-05-30 DIAGNOSIS — S0181XA Laceration without foreign body of other part of head, initial encounter: Secondary | ICD-10-CM | POA: Insufficient documentation

## 2019-05-30 DIAGNOSIS — Y929 Unspecified place or not applicable: Secondary | ICD-10-CM | POA: Insufficient documentation

## 2019-05-30 MED ORDER — SODIUM CHLORIDE 0.9% FLUSH: 3.0000 mL | Freq: Once | INTRAVENOUS | Status: DC

## 2019-05-30 NOTE — ED Triage Notes (Signed)
Patients [place]. Patient hit her left jaw. Patient has a laceration on the left jaw. Patient states that she has been falling a lot lately. Patient is not having any dizziness or any other symptoms.

## 2019-05-31 ENCOUNTER — Emergency Department (HOSPITAL_COMMUNITY): Payer: Medicare Other

## 2019-05-31 LAB — CBG MONITORING, ED: Glucose-Capillary: 414 mg/dL — ABNORMAL HIGH (ref 70–99)

## 2019-05-31 MED ORDER — MECLIZINE HCL 25 MG PO TABS: 25.0000 mg | ORAL_TABLET | Freq: Once | ORAL | Status: DC

## 2019-05-31 MED ORDER — LIDOCAINE HCL 2 % IJ SOLN
INTRAMUSCULAR | Status: AC
  Filled 2019-05-31: qty 20

## 2019-05-31 MED ORDER — LIDOCAINE-EPINEPHRINE (PF) 2 %-1:200000 IJ SOLN: 10.0000 mL | Freq: Once | INTRAMUSCULAR | Status: DC

## 2019-05-31 NOTE — ED Provider Notes (Signed)
[facility] DEPT Provider Note   CSN: [id number] Arrival date & time: 05/30/19  2046     History Chief Complaint  Patient presents with  . Fall    [name] is a 68 y.o. female.  68 yo F with a chief complaint of a fall.  Patient states that she has been losing her balance fairly frequently ever since about 12 months ago.  She has seen her family doctor for this but no known etiology.  States that today she was standing behind a truck and she lost her balance and struck the left side of her face against the tailgate.  Suffered a laceration to that area.  Denies other injury denies headache denies confusion denies vomiting denies loss of consciousness denies neck pain chest pain shortness of breath abdominal pain extremity pain.  Tetanus is up-to-date.  The history is provided by the patient.  Fall This is a recurrent problem. The current episode started 3 to 5 hours ago. The problem occurs constantly. The problem has not changed since onset.Associated symptoms include headaches. Pertinent negatives include no chest pain and no shortness of breath. Nothing aggravates the symptoms. Nothing relieves the symptoms. She has tried nothing for the symptoms. The treatment provided no relief.       Past Medical History:  Diagnosis Date  . Arthritis   . Diabetes mellitus without complication ([place])   . GERD (gastroesophageal reflux disease)   . H/O hiatal hernia   . Headache(784.0)   . Hyperlipidemia   . Hypertension   . Neuromuscular disorder ([facility])    neuropathy feet   . Sleep apnea    recently diagnosed- wears cpap    Patient Active Problem List   Diagnosis Date Noted  . Acute bronchiolitis 10/10/2017  . Shortness of breath 10/21/2015  . Acute bronchitis and bronchiolitis 10/21/2015  . Lactic acidosis 10/21/2015  . Benign essential HTN 10/21/2015  . GERD (gastroesophageal reflux disease) 10/21/2015  . Diabetes mellitus with  nephropathy ([place]) 10/21/2015  . Acute renal failure superimposed on stage 3 chronic kidney disease ([place]) 10/21/2015  . Chronic arthritis 10/21/2015  . SOB (shortness of breath) 10/21/2015  . OSA (obstructive sleep apnea) 10/11/2012  . Rotator cuff tear, non-traumatic 08/25/2012    Past Surgical History:  Procedure Laterality Date  . ABDOMINAL HYSTERECTOMY     partial  . BACK SURGERY    . CHOLECYSTECTOMY    . COLONOSCOPY     age 50- doesnt know where or who   . left shoulder     x 2 surgeries  . right shoulder     x2  . SHOULDER OPEN ROTATOR CUFF REPAIR Right 08/25/2012   Procedure: ROTATOR CUFF REPAIR SHOULDER OPEN WITH ACROMIOPLASTY;  Surgeon: Tobi Bastos, MD;  Location: [facility];  Service: Orthopedics;  Laterality: Right;  . TONSILLECTOMY       OB History    Gravida  4   Para  3   Term  3   Preterm  0   AB  1   Living  3     SAB  0   TAB  1   Ectopic  0   Multiple  0   Live Births  3           Family History  Problem Relation Age of Onset  . Colon cancer Father   . Colon cancer Paternal Grandfather   . Colon cancer Maternal Grandfather   . Bone cancer Maternal Uncle   .  Colon polyps Neg Hx   . Esophageal cancer Neg Hx   . Rectal cancer Neg Hx   . Stomach cancer Neg Hx     Social History   Tobacco Use  . Smoking status: Never Smoker  . Smokeless tobacco: Never Used  Substance Use Topics  . Alcohol use: Yes    Alcohol/week: 0.0 standard drinks    Comment: rarely  . Drug use: No    Home Medications Prior to Admission medications   Medication Sig Start Date End Date Taking? Authorizing Provider  ACCU-CHEK AVIVA PLUS test strip USE BID FOR GLUCOSE TESTING 12/19/14   [provider]  amLODipine (NORVASC) 5 MG tablet Take 5 mg by mouth daily.    [provider]  atorvastatin (LIPITOR) 10 MG tablet Take 10 mg by mouth daily at 6 PM.    [provider]  cetirizine (ZYRTEC) 10 MG tablet Take 10 mg by mouth daily.   08/31/18   [provider]  cyclobenzaprine (FLEXERIL) 5 MG tablet Take 5 mg by mouth at bedtime.  01/17/19   [provider]  diclofenac sodium (VOLTAREN) 1 % GEL Apply 2 g topically daily.  01/17/19   [provider]  DULoxetine (CYMBALTA) 20 MG capsule Take 20 mg by mouth daily.  01/03/19   [provider]  furosemide (LASIX) 20 MG tablet Take 20 mg by mouth daily.  09/13/15   [provider]  glimepiride (AMARYL) 4 MG tablet Take 4 mg by mouth daily with breakfast.  11/23/18   [provider]  ipratropium-albuterol (DUONEB) 0.5-2.5 (3) MG/3ML SOLN Take 3 mLs by nebulization 2 (two) times daily. Patient taking differently: Take 3 mLs by nebulization every 6 (six) hours as needed (sob and wheezing).  10/23/15   Barton Dubois, MD  LANTUS SOLOSTAR 100 UNIT/ML Solostar Pen Inject 30 Units into the skin at bedtime.  01/07/19   [provider]  lisinopril (ZESTRIL) 40 MG tablet Take 40 mg by mouth daily.  12/22/18   [provider]  losartan (COZAAR) 25 MG tablet Take 1 tablet (25 mg total) by mouth daily. 10/23/15   Barton Dubois, MD  LYRICA 200 MG capsule Take 200 mg by mouth 3 (three) times daily. 10/08/17   [provider]  metFORMIN (GLUCOPHAGE) 1000 MG tablet Take 1,000 mg by mouth 2 (two) times daily. 07/19/17   [provider]  omeprazole (PRILOSEC) 20 MG capsule Take 20 mg by mouth daily.  10/05/14   [provider]  ondansetron (ZOFRAN) 4 MG tablet Take 1 tablet (4 mg total) by mouth every 6 (six) hours. 03/20/19   Kendrick, Caitlyn S, PA-C  oxycodone (ROXICODONE) 30 MG immediate release tablet Take 30 mg by mouth every 6 (six) hours as needed for pain.  01/17/19   [provider]  oxyCODONE-acetaminophen (PERCOCET) 10-325 MG tablet Take 1 tablet by mouth every 6 (six) hours as needed for pain.  01/21/19   [provider]  tiZANidine (ZANAFLEX) 4 MG tablet Take 4 mg by mouth daily.  10/17/14    [provider]  VENTOLIN HFA 108 (90 Base) MCG/ACT inhaler Inhale 2 puffs into the lungs every 4 (four) hours as needed for wheezing or shortness of breath.  10/01/15   [provider]    Allergies    Patient has no known allergies.  Review of Systems   Review of Systems  Constitutional: Negative for chills and fever.  HENT: Negative for congestion and rhinorrhea.   Eyes: Negative  for redness and visual disturbance.  Respiratory: Negative for shortness of breath and wheezing.   Cardiovascular: Negative for chest pain and palpitations.  Gastrointestinal: Negative for nausea and vomiting.  Genitourinary: Negative for dysuria and urgency.  Musculoskeletal: Negative for arthralgias and myalgias.  Skin: Positive for wound. Negative for pallor.  Neurological: Positive for headaches. Negative for dizziness.    Physical Exam Updated Vital Signs BP 122/80 (BP Location: Left Arm)   Pulse 79   Temp 98.4 F (36.9 C) (Oral)   Resp 17   Ht 5\' 4"  (1.626 m)   Wt 90.7 kg   SpO2 100%   BMI 34.33 kg/m   Physical Exam Vitals and nursing note reviewed.  Constitutional:      General: She is not in acute distress.    Appearance: She is well-developed. She is not diaphoretic.  HENT:     Head: Normocephalic.     Comments: 1 cm laceration to the left cheek. Eyes:     Pupils: Pupils are equal, round, and reactive to light.  Cardiovascular:     Rate and Rhythm: Normal rate and regular rhythm.     Heart sounds: No murmur. No friction rub. No gallop.   Pulmonary:     Effort: Pulmonary effort is normal.     Breath sounds: No wheezing or rales.  Abdominal:     General: There is no distension.     Palpations: Abdomen is soft.     Tenderness: There is no abdominal tenderness.  Musculoskeletal:        General: No tenderness.     Cervical back: Normal range of motion and neck supple.  Skin:    General: Skin is warm and dry.  Neurological:     Mental Status: She is alert and  oriented to person, place, and time.  Psychiatric:        Behavior: Behavior normal.     ED Results / Procedures / Treatments   Labs (all labs ordered are listed, but only abnormal results are displayed) Labs Reviewed  CBG MONITORING, ED - Abnormal; Notable for the following components:      Result Value   Glucose-Capillary 414 (*)    All other components within normal limits    EKG EKG Interpretation  Date/Time:  Monday May 30 2019 22:09:23 EST Ventricular Rate:  85 PR Interval:    QRS Duration: 82 QT Interval:  372 QTC Calculation: 443 R Axis:   31 Text Interpretation: Sinus rhythm Consider left ventricular hypertrophy No significant change since last tracing Confirmed by Deno Etienne [phone]) on 05/30/2019 11:47:21 PM   Radiology CT Head Wo Contrast  Result Date: 05/31/2019 CLINICAL DATA:  Head trauma. Fall today. EXAM: CT HEAD WITHOUT CONTRAST TECHNIQUE: Contiguous axial images were obtained from the base of the skull through the vertex without intravenous contrast. COMPARISON:  Head CT 10/11/2018 FINDINGS: Brain: No intracranial hemorrhage, mass effect, or midline shift. No hydrocephalus. The basilar cisterns are patent. No evidence of territorial infarct or acute ischemia. No extra-axial or intracranial fluid collection. Vascular: No hyperdense vessel. Skull: No fracture or focal lesion. Sinuses/Orbits: Chronic air-fluid level within the left side of sphenoid sinus with surrounding sclerosis. Paranasal sinuses are otherwise clear. No acute findings. Other: None. IMPRESSION: 1. No acute intracranial abnormality. No skull fracture. 2. Chronic left sphenoid sinusitis. Electronically Signed   By: Keith Rake M.D.   On: 05/31/2019 01:02    Procedures Procedures (including critical care time)  Medications Ordered in ED Medications  sodium  chloride flush (NS) 0.9 % injection 3 mL (has no administration in time range)  lidocaine-EPINEPHrine (XYLOCAINE W/EPI) 2  %-1:200000 (PF) injection 10 mL (has no administration in time range)  lidocaine (XYLOCAINE) 2 % (with pres) injection (has no administration in time range)  meclizine (ANTIVERT) tablet 25 mg (has no administration in time range)    ED Course  I have reviewed the triage vital signs and the nursing notes.  Pertinent labs & imaging results that were available during my care of the patient were reviewed by me and considered in my medical decision making (see chart for details).    MDM Rules/Calculators/A&P                      68 yo F with a chief complaints of a fall.  Patient states that she lost her balance.  And struck her face.  Denies other injury.  Patient has a very small laceration will suture bedside.  Sutured at bedside. D/c home.   I was notified by the tech upon discharge the patient was very unsteady on her feet.  I discussed this with her, she feels that she is currently at her baseline.  Requesting discharge home.  3:32 AM:  I have discussed the diagnosis/risks/treatment options with the patient and believe the pt to be eligible for discharge home to follow-up with PCP. We also discussed returning to the ED immediately if new or worsening sx occur. We discussed the sx which are most concerning (e.g., sudden worsening pain, fever, inability to tolerate by mouth) that necessitate immediate return. Medications administered to the patient during their visit and any new prescriptions provided to the patient are listed below.  Medications given during this visit Medications  sodium chloride flush (NS) 0.9 % injection 3 mL (has no administration in time range)  lidocaine-EPINEPHrine (XYLOCAINE W/EPI) 2 %-1:200000 (PF) injection 10 mL (has no administration in time range)  lidocaine (XYLOCAINE) 2 % (with pres) injection (has no administration in time range)  meclizine (ANTIVERT) tablet 25 mg (has no administration in time range)     The patient appears reasonably screen and/or  stabilized for discharge and I doubt any other medical condition or other [facility] requiring further screening, evaluation, or treatment in the ED at this time prior to discharge.   Final Clinical Impression(s) / ED Diagnoses Final diagnoses:  Cheek laceration, left, initial encounter    Rx / DC Orders ED Discharge Orders    None       Deno Etienne, DO 05/31/19 [phone]

## 2019-05-31 NOTE — ED Notes (Signed)
Patient unsteady on feet when ambulating to wheelchair. Dr. Tyrone Nine spoke with patient, she is requesting to still go home.

## 2019-05-31 NOTE — Discharge Instructions (Signed)
Keep the area moist. You can use half hydrogen peroxide half water on a Q-tip to gently clean off dried blood. The suture should dissolve on their own by about day 5. If they are still there after that you can gently plucked them out with tweezers. Please return to the ED for redness drainage or fever.  Please discuss with your family doctor your issues with recurrent falls and unsteadiness on your feet. This is something that may mean to need to have physical therapy see you or they may send you to see neurologist.

## 2019-06-17 DIAGNOSIS — I639 Cerebral infarction, unspecified: Secondary | ICD-10-CM

## 2019-06-17 HISTORY — DX: Cerebral infarction, unspecified: I63.9

## 2019-07-02 ENCOUNTER — Ambulatory Visit
Admission: RE | Admit: 2019-07-02 | Discharge: 2019-07-02 | Disposition: A | Discharge status: Home / Self Care | Payer: Medicare Other | Source: Ambulatory Visit | Attending: Family | Admitting: Family

## 2019-07-02 ENCOUNTER — Other Ambulatory Visit: Payer: Self-pay

## 2019-07-02 DIAGNOSIS — M79652 Pain in left thigh: Secondary | ICD-10-CM

## 2019-07-02 DIAGNOSIS — M79605 Pain in left leg: Secondary | ICD-10-CM

## 2019-07-05 ENCOUNTER — Other Ambulatory Visit: Payer: Self-pay

## 2019-07-05 ENCOUNTER — Encounter (HOSPITAL_COMMUNITY): Payer: Self-pay | Admitting: Family Medicine

## 2019-07-05 ENCOUNTER — Emergency Department (HOSPITAL_COMMUNITY)
Admission: EM | Admit: 2019-07-05 | Discharge: 2019-07-05 | Discharge status: Against Medical Advice | Payer: Medicare Other | Source: Home / Self Care

## 2019-07-05 DIAGNOSIS — R531 Weakness: Secondary | ICD-10-CM | POA: Diagnosis not present

## 2019-07-05 DIAGNOSIS — Z5321 Procedure and treatment not carried out due to patient leaving prior to being seen by health care provider: Secondary | ICD-10-CM | POA: Insufficient documentation

## 2019-07-05 DIAGNOSIS — N179 Acute kidney failure, unspecified: Secondary | ICD-10-CM | POA: Diagnosis not present

## 2019-07-05 LAB — BASIC METABOLIC PANEL
Anion gap: 16 — ABNORMAL HIGH (ref 5–15)
BUN: 66 mg/dL — ABNORMAL HIGH (ref 8–23)
CO2: 18 mmol/L — ABNORMAL LOW (ref 22–32)
Calcium: 8.8 mg/dL — ABNORMAL LOW (ref 8.9–10.3)
Chloride: 103 mmol/L (ref 98–111)
Creatinine, Ser: 5.16 mg/dL — ABNORMAL HIGH (ref 0.44–1.00)
GFR calc Af Amer: 9 mL/min — ABNORMAL LOW (ref >60)
GFR calc non Af Amer: 8 mL/min — ABNORMAL LOW (ref >60)
Glucose, Bld: 249 mg/dL — ABNORMAL HIGH (ref 70–99)
Potassium: 4.7 mmol/L (ref 3.5–5.1)
Sodium: 137 mmol/L (ref 135–145)

## 2019-07-05 LAB — CBC
HCT: 39 % (ref 36.0–46.0)
Hemoglobin: 10.9 g/dL — ABNORMAL LOW (ref 12.0–15.0)
MCH: 21.6 pg — ABNORMAL LOW (ref 26.0–34.0)
MCHC: 27.9 g/dL — ABNORMAL LOW (ref 30.0–36.0)
MCV: 77.2 fL — ABNORMAL LOW (ref 80.0–100.0)
Platelets: 246 10*3/uL (ref 150–400)
RBC: 5.05 MIL/uL (ref 3.87–5.11)
RDW: 18.9 % — ABNORMAL HIGH (ref 11.5–15.5)
WBC: 10.8 10*3/uL — ABNORMAL HIGH (ref 4.0–10.5)
nRBC: 0 % (ref 0.0–0.2)

## 2019-07-05 LAB — CBG MONITORING, ED: Glucose-Capillary: 210 mg/dL — ABNORMAL HIGH (ref 70–99)

## 2019-07-05 NOTE — ED Notes (Signed)
Pt wasn't able to get good sterile urine sample due to tissue paper put in sample.

## 2019-07-05 NOTE — ED Triage Notes (Signed)
Patient states she was directed to come to the ED by her PCP. Patient states she is a diabetic and been using Trulicity. The injection is once a week, but patient has been administering daily for the last week. She is alert, oriented x 4.

## 2019-07-06 ENCOUNTER — Emergency Department (HOSPITAL_COMMUNITY): Payer: Medicare Other

## 2019-07-06 ENCOUNTER — Other Ambulatory Visit: Payer: Self-pay

## 2019-07-06 ENCOUNTER — Inpatient Hospital Stay (HOSPITAL_COMMUNITY)
Admission: EM | Admit: 2019-07-06 | Discharge: 2019-07-12 | DRG: 682 | Disposition: A | Discharge status: Home / Self Care | Payer: Medicare Other | Attending: Internal Medicine | Admitting: Internal Medicine

## 2019-07-06 DIAGNOSIS — Z6835 Body mass index (BMI) 35.0-35.9, adult: Secondary | ICD-10-CM

## 2019-07-06 DIAGNOSIS — Q211 Atrial septal defect: Secondary | ICD-10-CM

## 2019-07-06 DIAGNOSIS — I10 Essential (primary) hypertension: Secondary | ICD-10-CM | POA: Diagnosis present

## 2019-07-06 DIAGNOSIS — Z20822 Contact with and (suspected) exposure to covid-19: Secondary | ICD-10-CM | POA: Diagnosis present

## 2019-07-06 DIAGNOSIS — E1122 Type 2 diabetes mellitus with diabetic chronic kidney disease: Secondary | ICD-10-CM | POA: Diagnosis present

## 2019-07-06 DIAGNOSIS — E785 Hyperlipidemia, unspecified: Secondary | ICD-10-CM | POA: Diagnosis present

## 2019-07-06 DIAGNOSIS — I6340 Cerebral infarction due to embolism of unspecified cerebral artery: Secondary | ICD-10-CM | POA: Insufficient documentation

## 2019-07-06 DIAGNOSIS — Z7984 Long term (current) use of oral hypoglycemic drugs: Secondary | ICD-10-CM

## 2019-07-06 DIAGNOSIS — I081 Rheumatic disorders of both mitral and tricuspid valves: Secondary | ICD-10-CM | POA: Diagnosis present

## 2019-07-06 DIAGNOSIS — Z800 Family history of malignant neoplasm of digestive organs: Secondary | ICD-10-CM

## 2019-07-06 DIAGNOSIS — N189 Chronic kidney disease, unspecified: Secondary | ICD-10-CM | POA: Diagnosis present

## 2019-07-06 DIAGNOSIS — E1142 Type 2 diabetes mellitus with diabetic polyneuropathy: Secondary | ICD-10-CM | POA: Diagnosis present

## 2019-07-06 DIAGNOSIS — I959 Hypotension, unspecified: Secondary | ICD-10-CM | POA: Diagnosis present

## 2019-07-06 DIAGNOSIS — G4733 Obstructive sleep apnea (adult) (pediatric): Secondary | ICD-10-CM | POA: Diagnosis present

## 2019-07-06 DIAGNOSIS — D509 Iron deficiency anemia, unspecified: Secondary | ICD-10-CM | POA: Diagnosis present

## 2019-07-06 DIAGNOSIS — D631 Anemia in chronic kidney disease: Secondary | ICD-10-CM | POA: Diagnosis present

## 2019-07-06 DIAGNOSIS — I129 Hypertensive chronic kidney disease with stage 1 through stage 4 chronic kidney disease, or unspecified chronic kidney disease: Secondary | ICD-10-CM | POA: Diagnosis present

## 2019-07-06 DIAGNOSIS — N179 Acute kidney failure, unspecified: Principal | ICD-10-CM | POA: Diagnosis present

## 2019-07-06 DIAGNOSIS — I700 Atherosclerosis of aorta: Secondary | ICD-10-CM | POA: Diagnosis present

## 2019-07-06 DIAGNOSIS — E1121 Type 2 diabetes mellitus with diabetic nephropathy: Secondary | ICD-10-CM | POA: Diagnosis present

## 2019-07-06 DIAGNOSIS — K219 Gastro-esophageal reflux disease without esophagitis: Secondary | ICD-10-CM | POA: Diagnosis present

## 2019-07-06 DIAGNOSIS — I639 Cerebral infarction, unspecified: Secondary | ICD-10-CM

## 2019-07-06 DIAGNOSIS — E1165 Type 2 diabetes mellitus with hyperglycemia: Secondary | ICD-10-CM | POA: Diagnosis not present

## 2019-07-06 DIAGNOSIS — N183 Chronic kidney disease, stage 3 unspecified: Secondary | ICD-10-CM | POA: Diagnosis present

## 2019-07-06 DIAGNOSIS — G9340 Encephalopathy, unspecified: Secondary | ICD-10-CM | POA: Diagnosis present

## 2019-07-06 DIAGNOSIS — I459 Conduction disorder, unspecified: Secondary | ICD-10-CM | POA: Diagnosis present

## 2019-07-06 DIAGNOSIS — M1990 Unspecified osteoarthritis, unspecified site: Secondary | ICD-10-CM | POA: Diagnosis present

## 2019-07-06 DIAGNOSIS — E860 Dehydration: Secondary | ICD-10-CM | POA: Diagnosis present

## 2019-07-06 DIAGNOSIS — E669 Obesity, unspecified: Secondary | ICD-10-CM | POA: Diagnosis present

## 2019-07-06 LAB — POCT I-STAT EG7
Acid-base deficit: 13 mmol/L — ABNORMAL HIGH (ref 0.0–2.0)
Bicarbonate: 13.6 mmol/L — ABNORMAL LOW (ref 20.0–28.0)
Calcium, Ion: 1.04 mmol/L — ABNORMAL LOW (ref 1.15–1.40)
HCT: 29 % — ABNORMAL LOW (ref 36.0–46.0)
Hemoglobin: 9.9 g/dL — ABNORMAL LOW (ref 12.0–15.0)
O2 Saturation: 97 %
Potassium: 4.4 mmol/L (ref 3.5–5.1)
Sodium: 137 mmol/L (ref 135–145)
TCO2: 15 mmol/L — ABNORMAL LOW (ref 22–32)
pCO2, Ven: 34.9 mmHg — ABNORMAL LOW (ref 44.0–60.0)
pH, Ven: 7.199 — CL (ref 7.250–7.430)
pO2, Ven: 113 mmHg — ABNORMAL HIGH (ref 32.0–45.0)

## 2019-07-06 LAB — TYPE AND SCREEN
ABO/RH(D): B POS
Antibody Screen: NEGATIVE

## 2019-07-06 LAB — CBC WITH DIFFERENTIAL/PLATELET
Abs Immature Granulocytes: 0.02 10*3/uL (ref 0.00–0.07)
Basophils Absolute: 0.1 10*3/uL (ref 0.0–0.1)
Basophils Relative: 2 %
Eosinophils Absolute: 0.5 10*3/uL (ref 0.0–0.5)
Eosinophils Relative: 7 %
HCT: 31.9 % — ABNORMAL LOW (ref 36.0–46.0)
Hemoglobin: 9.1 g/dL — ABNORMAL LOW (ref 12.0–15.0)
Immature Granulocytes: 0 %
Lymphocytes Relative: 32 %
Lymphs Abs: 2.4 10*3/uL (ref 0.7–4.0)
MCH: 22.7 pg — ABNORMAL LOW (ref 26.0–34.0)
MCHC: 28.5 g/dL — ABNORMAL LOW (ref 30.0–36.0)
MCV: 79.6 fL — ABNORMAL LOW (ref 80.0–100.0)
Monocytes Absolute: 1.1 10*3/uL — ABNORMAL HIGH (ref 0.1–1.0)
Monocytes Relative: 14 %
Neutro Abs: 3.5 10*3/uL (ref 1.7–7.7)
Neutrophils Relative %: 45 %
Platelets: 162 10*3/uL (ref 150–400)
RBC: 4.01 MIL/uL (ref 3.87–5.11)
RDW: 18.6 % — ABNORMAL HIGH (ref 11.5–15.5)
WBC: 7.6 10*3/uL (ref 4.0–10.5)
nRBC: 0 % (ref 0.0–0.2)

## 2019-07-06 LAB — LACTIC ACID, PLASMA: Lactic Acid, Venous: 6.4 mmol/L (ref 0.5–1.9)

## 2019-07-06 LAB — CBG MONITORING, ED: Glucose-Capillary: 297 mg/dL — ABNORMAL HIGH (ref 70–99)

## 2019-07-06 LAB — AMMONIA: Ammonia: 20 umol/L (ref 9–35)

## 2019-07-06 LAB — POC OCCULT BLOOD, ED: Fecal Occult Bld: NEGATIVE

## 2019-07-06 MED ORDER — SODIUM CHLORIDE 0.9 % IV BOLUS
1000.0000 mL | Freq: Once | INTRAVENOUS | Status: AC
  Administered 2019-07-06: 23:00:00 1000 mL via INTRAVENOUS

## 2019-07-06 NOTE — ED Triage Notes (Signed)
Pt from home c/o weakness and fatigue for the past week. Pt was seen here yesterday but left due to wait times. EMS reported initial bp 50/20, pt received 1L NS in route with improvement of bp. Pt is A&Ox4, lethargic, appears pale; reports black stools. CBG 369, bilateral IVs in [facility]

## 2019-07-06 NOTE — ED Provider Notes (Signed)
[facility] EMERGENCY DEPARTMENT Provider Note   CSN: [id number] Arrival date & time: 07/06/19  2221     History Chief Complaint  Patient presents with  . Weakness    [name] is a 69 y.o. female.  Patient is a 69 year old female with a history of diabetes, hypertension, chronic kidney disease who was seen by her doctor yesterday and due to not feeling well and being borderline hypotensive recommended that she go to the emergency room.  Patient did go to [facility] emergency room but after waiting 7 hours decided to go home.  Patient states for the last week she has not been feeling well.  She describes it as generalized weakness, malaise.  She denies chest pain, shortness of breath, abdominal pain, vomiting or diarrhea.  She states maybe her stool has been darker but she has not noticed any frank blood.  Today when she was at home trying to get around she said her legs buckled and she fell to the ground.  It is unclear whether she had a syncopal event or not.  She did call paramedics after this.  She was found to be hypotensive upon their arrival and sleepy but arousable.  She had a blood sugar in the 200s but was noted to be hypotensive with a pressure in the 60s.  Patient does report that she takes blood pressure medication and she did take her medication today.  She is continued to take her diabetic medicines as well.  She has not been eating or drinking much in the last week due to poor appetite and does not remember the last time she urinated.  The history is provided by the patient and the EMS personnel.  Weakness      Past Medical History:  Diagnosis Date  . Arthritis   . Diabetes mellitus without complication ([place])   . GERD (gastroesophageal reflux disease)   . H/O hiatal hernia   . Headache(784.0)   . Hyperlipidemia   . Hypertension   . Neuromuscular disorder (HCC)    neuropathy feet   . Sleep apnea    recently diagnosed- wears cpap     Patient Active Problem List   Diagnosis Date Noted  . Acute bronchiolitis 10/10/2017  . Shortness of breath 10/21/2015  . Acute bronchitis and bronchiolitis 10/21/2015  . Lactic acidosis 10/21/2015  . Benign essential HTN 10/21/2015  . GERD (gastroesophageal reflux disease) 10/21/2015  . Diabetes mellitus with nephropathy ([place]) 10/21/2015  . Acute renal failure superimposed on stage 3 chronic kidney disease ([place]) 10/21/2015  . Chronic arthritis 10/21/2015  . SOB (shortness of breath) 10/21/2015  . OSA (obstructive sleep apnea) 10/11/2012  . Rotator cuff tear, non-traumatic 08/25/2012    Past Surgical History:  Procedure Laterality Date  . ABDOMINAL HYSTERECTOMY     partial  . BACK SURGERY    . CHOLECYSTECTOMY    . COLONOSCOPY     age 71- doesnt know where or who   . left shoulder     x 2 surgeries  . right shoulder     x2  . SHOULDER OPEN ROTATOR CUFF REPAIR Right 08/25/2012   Procedure: ROTATOR CUFF REPAIR SHOULDER OPEN WITH ACROMIOPLASTY;  Surgeon: Tobi Bastos, MD;  Location: [facility] ORS;  Service: Orthopedics;  Laterality: Right;  . TONSILLECTOMY       OB History    Gravida  4   Para  3   Term  3   Preterm  0   AB  1   Living  3     SAB  0   TAB  1   Ectopic  0   Multiple  0   Live Births  3           Family History  Problem Relation Age of Onset  . Colon cancer Father   . Colon cancer Paternal Grandfather   . Colon cancer Maternal Grandfather   . Bone cancer Maternal Uncle   . Colon polyps Neg Hx   . Esophageal cancer Neg Hx   . Rectal cancer Neg Hx   . Stomach cancer Neg Hx     Social History   Tobacco Use  . Smoking status: Never Smoker  . Smokeless tobacco: Never Used  Substance Use Topics  . Alcohol use: Yes    Alcohol/week: 0.0 standard drinks    Comment: rarely  . Drug use: No    Home Medications Prior to Admission medications   Medication Sig Start Date End Date Taking? Authorizing Provider  ACCU-CHEK AVIVA PLUS  test strip USE BID FOR GLUCOSE TESTING 12/19/14   [provider]  amLODipine (NORVASC) 5 MG tablet Take 5 mg by mouth daily.    [provider]  atorvastatin (LIPITOR) 10 MG tablet Take 10 mg by mouth daily at 6 PM.    [provider]  cetirizine (ZYRTEC) 10 MG tablet Take 10 mg by mouth daily.  08/31/18   [provider]  cyclobenzaprine (FLEXERIL) 5 MG tablet Take 5 mg by mouth at bedtime.  01/17/19   [provider]  diclofenac sodium (VOLTAREN) 1 % GEL Apply 2 g topically daily.  01/17/19   [provider]  DULoxetine (CYMBALTA) 20 MG capsule Take 20 mg by mouth daily.  01/03/19   [provider]  furosemide (LASIX) 20 MG tablet Take 20 mg by mouth daily.  09/13/15   [provider]  glimepiride (AMARYL) 4 MG tablet Take 4 mg by mouth daily with breakfast.  11/23/18   [provider]  ipratropium-albuterol (DUONEB) 0.5-2.5 (3) MG/3ML SOLN Take 3 mLs by nebulization 2 (two) times daily. Patient taking differently: Take 3 mLs by nebulization every 6 (six) hours as needed (sob and wheezing).  10/23/15   Barton Dubois, MD  LANTUS SOLOSTAR 100 UNIT/ML Solostar Pen Inject 30 Units into the skin at bedtime.  01/07/19   [provider]  lisinopril (ZESTRIL) 40 MG tablet Take 40 mg by mouth daily.  12/22/18   [provider]  losartan (COZAAR) 25 MG tablet Take 1 tablet (25 mg total) by mouth daily. 10/23/15   Barton Dubois, MD  LYRICA 200 MG capsule Take 200 mg by mouth 3 (three) times daily. 10/08/17   [provider]  metFORMIN (GLUCOPHAGE) 1000 MG tablet Take 1,000 mg by mouth 2 (two) times daily. 07/19/17   [provider]  omeprazole (PRILOSEC) 20 MG capsule Take 20 mg by mouth daily.  10/05/14   [provider]  ondansetron (ZOFRAN) 4 MG tablet Take 1 tablet (4 mg total) by mouth every 6 (six) hours. 03/20/19   Kendrick, Caitlyn S, PA-C  oxycodone (ROXICODONE) 30 MG immediate release tablet  Take 30 mg by mouth every 6 (six) hours as needed for pain.  01/17/19   [provider]  oxyCODONE-acetaminophen (PERCOCET) 10-325 MG tablet Take 1 tablet by mouth every 6 (six) hours as needed for pain.  01/21/19   [provider]  tiZANidine (ZANAFLEX) 4 MG tablet Take 4 mg by  mouth daily.  10/17/14   [provider]  VENTOLIN HFA 108 (90 Base) MCG/ACT inhaler Inhale 2 puffs into the lungs every 4 (four) hours as needed for wheezing or shortness of breath.  10/01/15   [provider]    Allergies    Patient has no known allergies.  Review of Systems   Review of Systems  Neurological: Positive for weakness.  All other systems reviewed and are negative.   Physical Exam Updated Vital Signs BP (!) 79/48   Pulse 74   Temp (!) 97.5 F (36.4 C) (Oral)   Resp 18   SpO2 98%   Physical Exam Vitals and nursing note reviewed.  Constitutional:      General: She is not in acute distress.    Appearance: She is well-developed. She is obese.     Comments: Sleepy but easily arousable  HENT:     Head: Normocephalic and atraumatic.  Eyes:     Pupils: Pupils are equal, round, and reactive to light.     Comments: Pale conjunctive a  Cardiovascular:     Rate and Rhythm: Normal rate and regular rhythm.     Heart sounds: Normal heart sounds. No murmur. No friction rub.     Comments: 1+ pulse in the radial bilaterally Pulmonary:     Effort: Pulmonary effort is normal.     Breath sounds: Normal breath sounds. No wheezing or rales.  Abdominal:     General: Bowel sounds are normal. There is no distension.     Palpations: Abdomen is soft.     Tenderness: There is no abdominal tenderness. There is no guarding or rebound.  Genitourinary:    Comments: Brown stool that is Hemoccult negative Musculoskeletal:        General: No tenderness. Normal range of motion.     Right lower leg: No edema.     Left lower leg: No edema.     Comments: No edema  Skin:    General:  Skin is warm and dry.     Findings: No rash.  Neurological:     Mental Status: She is oriented to person, place, and time.     Cranial Nerves: No cranial nerve deficit.     Comments: Able to move all extremities without difficulty.  Psychiatric:     Comments: Calm and cooperative     ED Results / Procedures / Treatments   Labs (all labs ordered are listed, but only abnormal results are displayed) Labs Reviewed  CBG MONITORING, ED - Abnormal; Notable for the following components:      Result Value   Glucose-Capillary 297 (*)    All other components within normal limits  RESPIRATORY PANEL BY RT PCR (FLU A&B, COVID)  CBC WITH DIFFERENTIAL/PLATELET  COMPREHENSIVE METABOLIC PANEL  LACTIC ACID, PLASMA  URINALYSIS, ROUTINE W REFLEX MICROSCOPIC  AMMONIA  POC OCCULT BLOOD, ED  TYPE AND SCREEN  TROPONIN I (HIGH SENSITIVITY)    EKG EKG Interpretation  Date/Time:  Wednesday July 06 2019 22:47:28 EST Ventricular Rate:  64 PR Interval:    QRS Duration: 89 QT Interval:  460 QTC Calculation: 475 R Axis:   57 Text Interpretation: Sinus rhythm No significant change since last tracing Confirmed by Blanchie Dessert [phone]) on 07/06/2019 10:52:19 PM   Radiology No results found.  Procedures Procedures (including critical care time)  Medications Ordered in ED Medications  sodium chloride 0.9 % bolus 1,000 mL (has no administration in time range)    ED Course  I have reviewed the triage vital signs and the nursing notes.  Pertinent labs & imaging results that were available during my care of the patient were reviewed by me and considered in my medical decision making (see chart for details).    MDM Rules/Calculators/A&P                      Elderly female presenting today with hypotension, generalized weakness and malaise.  Symptoms have been present for approximately 1 week.  Poor oral intake and patient was sent to [facility] emergency room yesterday.  However  unfortunately she waited for approximately 7 hours and decided to go home.  However labs were drawn prior to her leaving the emergency room.  Labs from yesterday show a new acute kidney injury with a creatinine of 5, elevated BUN of 66 and mild increased anion gap of 16 which is most likely related to uremia.  Hemoglobin yesterday was 10.  She is denying any infectious symptoms and at this time is afebrile.  She did take blood pressure medication today in that in light of dehydration and acute kidney injury feel that that is adding into her hypotension.  She has no chest pain or shortness of breath at this time and EKG is unchanged from prior.  Patient received 1 L of fluid prior to arrival and pressure is now in the high 70s.  She is able to answer all questions appropriately and is not altered.  Will repeat labs, start fluids and reassess.  11:25 PM After 2nd L pt bp improved.  Pt checked out to Dr. Betsey Holiday at 23:30  CRITICAL CARE Performed by: Timiyah Romito Total critical care time: 30 minutes Critical care time was exclusive of separately billable procedures and treating other patients. Critical care was necessary to treat or prevent imminent or life-threatening deterioration. Critical care was time spent personally by me on the following activities: development of treatment plan with patient and/or surrogate as well as nursing, discussions with consultants, evaluation of patient's response to treatment, examination of patient, obtaining history from patient or surrogate, ordering and performing treatments and interventions, ordering and review of laboratory studies, ordering and review of radiographic studies, pulse oximetry and re-evaluation of patient's condition.  Final Clinical Impression(s) / ED Diagnoses Final diagnoses:  None    Rx / DC Orders ED Discharge Orders    None       Blanchie Dessert, MD 07/06/19 2325

## 2019-07-07 ENCOUNTER — Inpatient Hospital Stay (HOSPITAL_COMMUNITY): Payer: Medicare Other

## 2019-07-07 ENCOUNTER — Encounter (HOSPITAL_COMMUNITY): Payer: Self-pay | Admitting: Internal Medicine

## 2019-07-07 ENCOUNTER — Emergency Department (HOSPITAL_COMMUNITY): Payer: Medicare Other

## 2019-07-07 ENCOUNTER — Other Ambulatory Visit: Payer: Self-pay

## 2019-07-07 DIAGNOSIS — I459 Conduction disorder, unspecified: Secondary | ICD-10-CM | POA: Diagnosis present

## 2019-07-07 DIAGNOSIS — D631 Anemia in chronic kidney disease: Secondary | ICD-10-CM | POA: Diagnosis present

## 2019-07-07 DIAGNOSIS — E785 Hyperlipidemia, unspecified: Secondary | ICD-10-CM | POA: Diagnosis present

## 2019-07-07 DIAGNOSIS — I350 Nonrheumatic aortic (valve) stenosis: Secondary | ICD-10-CM | POA: Diagnosis not present

## 2019-07-07 DIAGNOSIS — I639 Cerebral infarction, unspecified: Secondary | ICD-10-CM | POA: Diagnosis not present

## 2019-07-07 DIAGNOSIS — N189 Chronic kidney disease, unspecified: Secondary | ICD-10-CM

## 2019-07-07 DIAGNOSIS — I6310 Cerebral infarction due to embolism of unspecified precerebral artery: Secondary | ICD-10-CM | POA: Diagnosis not present

## 2019-07-07 DIAGNOSIS — Z20822 Contact with and (suspected) exposure to covid-19: Secondary | ICD-10-CM | POA: Diagnosis present

## 2019-07-07 DIAGNOSIS — I10 Essential (primary) hypertension: Secondary | ICD-10-CM | POA: Diagnosis not present

## 2019-07-07 DIAGNOSIS — Z800 Family history of malignant neoplasm of digestive organs: Secondary | ICD-10-CM | POA: Diagnosis not present

## 2019-07-07 DIAGNOSIS — G4733 Obstructive sleep apnea (adult) (pediatric): Secondary | ICD-10-CM

## 2019-07-07 DIAGNOSIS — I129 Hypertensive chronic kidney disease with stage 1 through stage 4 chronic kidney disease, or unspecified chronic kidney disease: Secondary | ICD-10-CM | POA: Diagnosis present

## 2019-07-07 DIAGNOSIS — I081 Rheumatic disorders of both mitral and tricuspid valves: Secondary | ICD-10-CM | POA: Diagnosis present

## 2019-07-07 DIAGNOSIS — Z7984 Long term (current) use of oral hypoglycemic drugs: Secondary | ICD-10-CM | POA: Diagnosis not present

## 2019-07-07 DIAGNOSIS — N179 Acute kidney failure, unspecified: Principal | ICD-10-CM

## 2019-07-07 DIAGNOSIS — I63432 Cerebral infarction due to embolism of left posterior cerebral artery: Secondary | ICD-10-CM

## 2019-07-07 DIAGNOSIS — E669 Obesity, unspecified: Secondary | ICD-10-CM | POA: Diagnosis present

## 2019-07-07 DIAGNOSIS — E1142 Type 2 diabetes mellitus with diabetic polyneuropathy: Secondary | ICD-10-CM | POA: Diagnosis present

## 2019-07-07 DIAGNOSIS — E860 Dehydration: Secondary | ICD-10-CM | POA: Diagnosis present

## 2019-07-07 DIAGNOSIS — I6340 Cerebral infarction due to embolism of unspecified cerebral artery: Secondary | ICD-10-CM | POA: Diagnosis not present

## 2019-07-07 DIAGNOSIS — E1122 Type 2 diabetes mellitus with diabetic chronic kidney disease: Secondary | ICD-10-CM | POA: Diagnosis present

## 2019-07-07 DIAGNOSIS — R531 Weakness: Secondary | ICD-10-CM | POA: Diagnosis present

## 2019-07-07 DIAGNOSIS — I959 Hypotension, unspecified: Secondary | ICD-10-CM | POA: Diagnosis present

## 2019-07-07 DIAGNOSIS — E1121 Type 2 diabetes mellitus with diabetic nephropathy: Secondary | ICD-10-CM | POA: Diagnosis not present

## 2019-07-07 DIAGNOSIS — E1165 Type 2 diabetes mellitus with hyperglycemia: Secondary | ICD-10-CM | POA: Diagnosis not present

## 2019-07-07 DIAGNOSIS — G9340 Encephalopathy, unspecified: Secondary | ICD-10-CM | POA: Diagnosis present

## 2019-07-07 DIAGNOSIS — N183 Chronic kidney disease, stage 3 unspecified: Secondary | ICD-10-CM | POA: Diagnosis present

## 2019-07-07 DIAGNOSIS — Z6835 Body mass index (BMI) 35.0-35.9, adult: Secondary | ICD-10-CM | POA: Diagnosis not present

## 2019-07-07 DIAGNOSIS — D509 Iron deficiency anemia, unspecified: Secondary | ICD-10-CM | POA: Diagnosis present

## 2019-07-07 DIAGNOSIS — I700 Atherosclerosis of aorta: Secondary | ICD-10-CM | POA: Diagnosis present

## 2019-07-07 DIAGNOSIS — K219 Gastro-esophageal reflux disease without esophagitis: Secondary | ICD-10-CM | POA: Diagnosis present

## 2019-07-07 DIAGNOSIS — Q211 Atrial septal defect: Secondary | ICD-10-CM | POA: Diagnosis not present

## 2019-07-07 DIAGNOSIS — M1990 Unspecified osteoarthritis, unspecified site: Secondary | ICD-10-CM | POA: Diagnosis present

## 2019-07-07 LAB — COMPREHENSIVE METABOLIC PANEL
ALT: 21 U/L (ref 0–44)
ALT: 26 U/L (ref 0–44)
AST: 34 U/L (ref 15–41)
AST: 32 U/L (ref 15–41)
Albumin: 2.7 g/dL — ABNORMAL LOW (ref 3.5–5.0)
Albumin: 3.2 g/dL — ABNORMAL LOW (ref 3.5–5.0)
Alkaline Phosphatase: 61 U/L (ref 38–126)
Alkaline Phosphatase: 72 U/L (ref 38–126)
Anion gap: 15 (ref 5–15)
Anion gap: 14 (ref 5–15)
BUN: 62 mg/dL — ABNORMAL HIGH (ref 8–23)
BUN: 50 mg/dL — ABNORMAL HIGH (ref 8–23)
CO2: 14 mmol/L — ABNORMAL LOW (ref 22–32)
CO2: 18 mmol/L — ABNORMAL LOW (ref 22–32)
Calcium: 7.6 mg/dL — ABNORMAL LOW (ref 8.9–10.3)
Calcium: 8.2 mg/dL — ABNORMAL LOW (ref 8.9–10.3)
Chloride: 106 mmol/L (ref 98–111)
Chloride: 105 mmol/L (ref 98–111)
Creatinine, Ser: 5.22 mg/dL — ABNORMAL HIGH (ref 0.44–1.00)
Creatinine, Ser: 4.17 mg/dL — ABNORMAL HIGH (ref 0.44–1.00)
GFR calc Af Amer: 9 mL/min — ABNORMAL LOW (ref >60)
GFR calc Af Amer: 12 mL/min — ABNORMAL LOW (ref >60)
GFR calc non Af Amer: 8 mL/min — ABNORMAL LOW (ref >60)
GFR calc non Af Amer: 10 mL/min — ABNORMAL LOW (ref >60)
Glucose, Bld: 323 mg/dL — ABNORMAL HIGH (ref 70–99)
Glucose, Bld: 206 mg/dL — ABNORMAL HIGH (ref 70–99)
Potassium: 4.5 mmol/L (ref 3.5–5.1)
Potassium: 4.3 mmol/L (ref 3.5–5.1)
Sodium: 135 mmol/L (ref 135–145)
Sodium: 137 mmol/L (ref 135–145)
Total Bilirubin: 0.7 mg/dL (ref 0.3–1.2)
Total Bilirubin: 0.4 mg/dL (ref 0.3–1.2)
Total Protein: 5.8 g/dL — ABNORMAL LOW (ref 6.5–8.1)
Total Protein: 6.8 g/dL (ref 6.5–8.1)

## 2019-07-07 LAB — URINALYSIS, ROUTINE W REFLEX MICROSCOPIC
Bilirubin Urine: NEGATIVE
Glucose, UA: >=500 mg/dL — AB
Ketones, ur: NEGATIVE mg/dL
Nitrite: NEGATIVE
Protein, ur: NEGATIVE mg/dL
Specific Gravity, Urine: 1.008 (ref 1.005–1.030)
pH: 5 (ref 5.0–8.0)

## 2019-07-07 LAB — CBC
HCT: 31.2 % — ABNORMAL LOW (ref 36.0–46.0)
Hemoglobin: 8.7 g/dL — ABNORMAL LOW (ref 12.0–15.0)
MCH: 21.8 pg — ABNORMAL LOW (ref 26.0–34.0)
MCHC: 27.9 g/dL — ABNORMAL LOW (ref 30.0–36.0)
MCV: 78.2 fL — ABNORMAL LOW (ref 80.0–100.0)
Platelets: 149 10*3/uL — ABNORMAL LOW (ref 150–400)
RBC: 3.99 MIL/uL (ref 3.87–5.11)
RDW: 18.6 % — ABNORMAL HIGH (ref 11.5–15.5)
WBC: 8.2 10*3/uL (ref 4.0–10.5)
nRBC: 0 % (ref 0.0–0.2)

## 2019-07-07 LAB — GLUCOSE, CAPILLARY
Glucose-Capillary: 223 mg/dL — ABNORMAL HIGH (ref 70–99)
Glucose-Capillary: 171 mg/dL — ABNORMAL HIGH (ref 70–99)
Glucose-Capillary: 234 mg/dL — ABNORMAL HIGH (ref 70–99)
Glucose-Capillary: 208 mg/dL — ABNORMAL HIGH (ref 70–99)

## 2019-07-07 LAB — HIV ANTIBODY (ROUTINE TESTING W REFLEX): HIV Screen 4th Generation wRfx: NONREACTIVE

## 2019-07-07 LAB — TROPONIN I (HIGH SENSITIVITY)
Troponin I (High Sensitivity): 6 ng/L (ref <18)
Troponin I (High Sensitivity): 6 ng/L (ref <18)
Troponin I (High Sensitivity): 8 ng/L (ref <18)

## 2019-07-07 LAB — LACTIC ACID, PLASMA
Lactic Acid, Venous: 4.4 mmol/L (ref 0.5–1.9)
Lactic Acid, Venous: 3.7 mmol/L (ref 0.5–1.9)
Lactic Acid, Venous: 3.9 mmol/L (ref 0.5–1.9)

## 2019-07-07 LAB — RESPIRATORY PANEL BY RT PCR (FLU A&B, COVID)
Influenza A by PCR: NEGATIVE
Influenza B by PCR: NEGATIVE
SARS Coronavirus 2 by RT PCR: NEGATIVE

## 2019-07-07 LAB — CBG MONITORING, ED: Glucose-Capillary: 254 mg/dL — ABNORMAL HIGH (ref 70–99)

## 2019-07-07 LAB — ECHOCARDIOGRAM COMPLETE: Weight: 3287.5 oz

## 2019-07-07 LAB — CREATININE, SERUM
Creatinine, Ser: 3.89 mg/dL — ABNORMAL HIGH (ref 0.44–1.00)
GFR calc Af Amer: 13 mL/min — ABNORMAL LOW (ref >60)
GFR calc non Af Amer: 11 mL/min — ABNORMAL LOW (ref >60)

## 2019-07-07 MED ORDER — TOPIRAMATE 100 MG PO TABS
100.0000 mg | ORAL_TABLET | Freq: Every day | ORAL | Status: DC
  Administered 2019-07-07 – 2019-07-12 (×6): 100 mg via ORAL
  Filled 2019-07-07 (×6): qty 1

## 2019-07-07 MED ORDER — ONDANSETRON HCL 4 MG/2ML IJ SOLN: 4.0000 mg | Freq: Four times a day (QID) | INTRAMUSCULAR | Status: DC | PRN

## 2019-07-07 MED ORDER — ONDANSETRON HCL 4 MG PO TABS: 4.0000 mg | ORAL_TABLET | Freq: Four times a day (QID) | ORAL | Status: DC | PRN

## 2019-07-07 MED ORDER — ACETAMINOPHEN 325 MG PO TABS
650.0000 mg | ORAL_TABLET | Freq: Four times a day (QID) | ORAL | Status: DC | PRN
  Administered 2019-07-07 – 2019-07-10 (×2): 650 mg via ORAL
  Filled 2019-07-07 (×2): qty 2

## 2019-07-07 MED ORDER — HYDRALAZINE HCL 20 MG/ML IJ SOLN: 10.0000 mg | INTRAMUSCULAR | Status: DC | PRN

## 2019-07-07 MED ORDER — SODIUM CHLORIDE 0.9 % IV BOLUS
1000.0000 mL | Freq: Once | INTRAVENOUS | Status: AC
  Administered 2019-07-07: 1000 mL via INTRAVENOUS

## 2019-07-07 MED ORDER — SODIUM CHLORIDE 0.9 % IV BOLUS
500.0000 mL | Freq: Once | INTRAVENOUS | Status: AC
  Administered 2019-07-07: 13:00:00 500 mL via INTRAVENOUS

## 2019-07-07 MED ORDER — PANTOPRAZOLE SODIUM 40 MG PO TBEC
40.0000 mg | DELAYED_RELEASE_TABLET | Freq: Every day | ORAL | Status: DC
  Administered 2019-07-07 – 2019-07-12 (×6): 40 mg via ORAL
  Filled 2019-07-07 (×6): qty 1

## 2019-07-07 MED ORDER — ALUM & MAG HYDROXIDE-SIMETH 200-200-20 MG/5ML PO SUSP
30.0000 mL | Freq: Once | ORAL | Status: AC
  Administered 2019-07-07: 02:00:00 30 mL via ORAL
  Filled 2019-07-07: qty 30

## 2019-07-07 MED ORDER — MOMETASONE FURO-FORMOTEROL FUM 100-5 MCG/ACT IN AERO
2.0000 | INHALATION_SPRAY | Freq: Two times a day (BID) | RESPIRATORY_TRACT | Status: DC
  Administered 2019-07-07 – 2019-07-12 (×11): 2 via RESPIRATORY_TRACT
  Filled 2019-07-07: qty 8.8

## 2019-07-07 MED ORDER — PREGABALIN 50 MG PO CAPS
200.0000 mg | ORAL_CAPSULE | Freq: Three times a day (TID) | ORAL | Status: DC
  Administered 2019-07-07 – 2019-07-12 (×16): 200 mg via ORAL
  Filled 2019-07-07: qty 4
  Filled 2019-07-07: qty 8
  Filled 2019-07-07 (×4): qty 4
  Filled 2019-07-07: qty 8
  Filled 2019-07-07: qty 4
  Filled 2019-07-07: qty 8
  Filled 2019-07-07 (×3): qty 4
  Filled 2019-07-07: qty 8
  Filled 2019-07-07 (×4): qty 4

## 2019-07-07 MED ORDER — ACETAMINOPHEN 650 MG RE SUPP: 650.0000 mg | Freq: Four times a day (QID) | RECTAL | Status: DC | PRN

## 2019-07-07 MED ORDER — HEPARIN SODIUM (PORCINE) 5000 UNIT/ML IJ SOLN
5000.0000 [IU] | Freq: Three times a day (TID) | INTRAMUSCULAR | Status: DC
  Administered 2019-07-07 – 2019-07-12 (×16): 5000 [IU] via SUBCUTANEOUS
  Filled 2019-07-07 (×16): qty 1

## 2019-07-07 MED ORDER — SODIUM CHLORIDE 0.9 % IV SOLN: INTRAVENOUS | Status: AC

## 2019-07-07 MED ORDER — AMLODIPINE BESYLATE 10 MG PO TABS
10.0000 mg | ORAL_TABLET | Freq: Every day | ORAL | Status: DC
  Administered 2019-07-08 – 2019-07-12 (×5): 10 mg via ORAL
  Filled 2019-07-07 (×6): qty 1

## 2019-07-07 MED ORDER — DULOXETINE HCL 20 MG PO CPEP
20.0000 mg | ORAL_CAPSULE | Freq: Every day | ORAL | Status: DC
  Administered 2019-07-07 – 2019-07-12 (×6): 20 mg via ORAL
  Filled 2019-07-07 (×6): qty 1

## 2019-07-07 MED ORDER — ATORVASTATIN CALCIUM 10 MG PO TABS
20.0000 mg | ORAL_TABLET | Freq: Every day | ORAL | Status: DC
  Administered 2019-07-07 – 2019-07-12 (×6): 20 mg via ORAL
  Filled 2019-07-07 (×6): qty 2

## 2019-07-07 MED ORDER — CYCLOBENZAPRINE HCL 5 MG PO TABS
5.0000 mg | ORAL_TABLET | Freq: Every day | ORAL | Status: DC
  Administered 2019-07-07 – 2019-07-11 (×5): 5 mg via ORAL
  Filled 2019-07-07 (×5): qty 1

## 2019-07-07 MED ORDER — INSULIN ASPART 100 UNIT/ML ~~LOC~~ SOLN
0.0000 [IU] | Freq: Three times a day (TID) | SUBCUTANEOUS | Status: DC
  Administered 2019-07-07: 3 [IU] via SUBCUTANEOUS
  Administered 2019-07-07: 11:00:00 2 [IU] via SUBCUTANEOUS
  Administered 2019-07-07 – 2019-07-08 (×2): 3 [IU] via SUBCUTANEOUS
  Administered 2019-07-08 – 2019-07-09 (×3): 5 [IU] via SUBCUTANEOUS
  Administered 2019-07-09: 17:00:00 3 [IU] via SUBCUTANEOUS
  Administered 2019-07-09: 07:00:00 2 [IU] via SUBCUTANEOUS
  Administered 2019-07-10 (×2): 3 [IU] via SUBCUTANEOUS
  Administered 2019-07-10 – 2019-07-11 (×3): 1 [IU] via SUBCUTANEOUS
  Administered 2019-07-12: 12:00:00 2 [IU] via SUBCUTANEOUS

## 2019-07-07 MED ORDER — ALBUTEROL SULFATE (2.5 MG/3ML) 0.083% IN NEBU: 3.0000 mL | INHALATION_SOLUTION | RESPIRATORY_TRACT | Status: DC | PRN

## 2019-07-07 MED ORDER — INSULIN GLARGINE 100 UNIT/ML ~~LOC~~ SOLN
20.0000 [IU] | Freq: Every day | SUBCUTANEOUS | Status: DC
  Administered 2019-07-07 – 2019-07-11 (×5): 20 [IU] via SUBCUTANEOUS
  Filled 2019-07-07 (×7): qty 0.2

## 2019-07-07 NOTE — Consult Note (Addendum)
Neurology Consultation  Reason for Consult: Stroke Referring Physician: Rise Patience, MD  CC: Stroke found on MRI  History is obtained from: Chart and patient  HPI: [name] is a 69 y.o. female with history of sleep apnea, neuropathy, hypertension, hyperlipidemia, headaches and diabetes.  Patient presented to the hospital secondary to weakness.  However during hospitalization due to mild encephalopathy patient had MRI which shows a punctate infarct thus neurology was consulted.  Per chart, patient was with her grandson, who states over the past 1 week the patient has been passing out whenever she tries to ambulate.  She is also had diarrhea for the last few days.  Most of her medications have remained the same however she did recently have Trulicity added to her diabetic medications.  When entering the ED she was found to be hypotensive with a blood pressure in the 40N systolically.  She was given a bolus of fluids.  Was also noted that her creatinine was 5.6 however 3 months ago is 0.93.  She also had a BUN of 62.  While in the ED she also showed some mild confusion.  Due to her confusion neurology MRI was ordered which showed a punctate infarct thus neurology was consulted for further stroke evaluation.  Currently patient is stating that she feels weak all over but no no localizing or lateralizing symptoms.  Denies any visual changes, speech changes, difficulty understanding people, inability to express herself, decrease in sensation.  ED course  Relevant labs include - Creatinine 5.6 BUN of 62 Glucose of 297 Ammonia 20  LKW: Unknown tpa given?: no, NIH stroke of 0 Premorbid modified Rankin scale (mRS): 0 NIH stroke scale of 0  Past Medical History:  Diagnosis Date  . Arthritis   . Diabetes mellitus without complication ([place])   . GERD (gastroesophageal reflux disease)   . H/O hiatal hernia   . Headache(784.0)   . Hyperlipidemia   . Hypertension   .  Neuromuscular disorder (HCC)    neuropathy feet   . Sleep apnea    recently diagnosed- wears cpap    Family History  Problem Relation Age of Onset  . Colon cancer Father   . Colon cancer Paternal Grandfather   . Colon cancer Maternal Grandfather   . Bone cancer Maternal Uncle   . Colon polyps Neg Hx   . Esophageal cancer Neg Hx   . Rectal cancer Neg Hx   . Stomach cancer Neg Hx    Social History:   reports that she has never smoked. She has never used smokeless tobacco. She reports current alcohol use. She reports that she does not use drugs.  Medications  Current Facility-Administered Medications:  .  0.9 %  sodium chloride infusion, , Intravenous, Continuous, Rise Patience, MD, Last Rate: 75 mL/hr at 07/07/19 1340, New Bag at 07/07/19 1340 .  acetaminophen (TYLENOL) tablet 650 mg, 650 mg, Oral, Q6H PRN, 650 mg at 07/07/19 1353 **OR** acetaminophen (TYLENOL) suppository 650 mg, 650 mg, Rectal, Q6H PRN, Rise Patience, MD .  albuterol (PROVENTIL) (2.5 MG/3ML) 0.083% nebulizer solution 3 mL, 3 mL, Inhalation, Q4H PRN, Rise Patience, MD .  amLODipine (NORVASC) tablet 10 mg, 10 mg, Oral, Daily, Gean Birchwood N, MD .  atorvastatin (LIPITOR) tablet 20 mg, 20 mg, Oral, Daily, Rise Patience, MD, 20 mg at 07/07/19 0272 .  cyclobenzaprine (FLEXERIL) tablet 5 mg, 5 mg, Oral, QHS, Rise Patience, MD .  DULoxetine (CYMBALTA) DR capsule 20 mg, 20  mg, Oral, Daily, Rise Patience, MD, 20 mg at 07/07/19 0815 .  heparin injection 5,000 Units, 5,000 Units, Subcutaneous, Q8H, Rise Patience, MD, 5,000 Units at 07/07/19 1353 .  hydrALAZINE (APRESOLINE) injection 10 mg, 10 mg, Intravenous, Q4H PRN, Rise Patience, MD .  insulin aspart (novoLOG) injection 0-9 Units, 0-9 Units, Subcutaneous, TID WC, Rise Patience, MD, 2 Units at 07/07/19 1117 .  insulin glargine (LANTUS) injection 20 Units, 20 Units, Subcutaneous, QHS, Rise Patience,  MD .  mometasone-formoterol [facility]) 100-5 MCG/ACT inhaler 2 puff, 2 puff, Inhalation, BID, Rise Patience, MD, 2 puff at 07/07/19 0750 .  ondansetron (ZOFRAN) tablet 4 mg, 4 mg, Oral, Q6H PRN **OR** ondansetron (ZOFRAN) injection 4 mg, 4 mg, Intravenous, Q6H PRN, Rise Patience, MD .  pantoprazole (PROTONIX) EC tablet 40 mg, 40 mg, Oral, Daily, Rise Patience, MD, 40 mg at 07/07/19 7353 .  pregabalin (LYRICA) capsule 200 mg, 200 mg, Oral, TID, Rise Patience, MD, 200 mg at 07/07/19 0811 .  topiramate (TOPAMAX) tablet 100 mg, 100 mg, Oral, Daily, Rise Patience, MD, 100 mg at 07/07/19 0815  ROS:    General ROS: Positive for -weak Psychological ROS: negative for - behavioral disorder, hallucinations, memory difficulties, mood swings or suicidal ideation Ophthalmic ROS: negative for - blurry vision, double vision, eye pain or loss of vision ENT ROS: negative for - epistaxis, nasal discharge, oral lesions, sore throat, tinnitus or vertigo Respiratory ROS: negative for - cough, hemoptysis, shortness of breath or wheezing Cardiovascular ROS: negative for - chest pain, dyspnea on exertion, edema or irregular heartbeat Gastrointestinal ROS: negative for - abdominal pain, diarrhea, hematemesis, nausea/vomiting or stool incontinence Genito-Urinary ROS: negative for - dysuria, hematuria, incontinence or urinary frequency/urgency Musculoskeletal ROS: Positive for -  muscular weakness Neurological ROS: as noted in HPI Dermatological ROS: negative for rash and skin lesion changes  Exam: Current vital signs: BP (!) 117/54 (BP Location: Right Arm)   Pulse 84   Temp 97.9 F (36.6 C) (Oral)   Resp 18   Wt 93.2 kg   SpO2 100%   BMI 35.27 kg/m  Vital signs in last 24 hours: Temp:  [97.3 F (36.3 C)-97.9 F (36.6 C)] 97.9 F (36.6 C) (01/21 1309) Pulse Rate:  [64-84] 84 (01/21 1309) Resp:  [12-23] 18 (01/21 1309) BP: (76-135)/(43-90) 117/54 (01/21 1309) SpO2:  [98  %-100 %] 100 % (01/21 1309) Weight:  [93.2 kg] 93.2 kg (01/20 2345)   Constitutional: Appears well-developed and well-nourished.  Psych: Affect appropriate to situation Eyes: No scleral injection HENT: No OP obstrucion Head: Normocephalic.  Cardiovascular: Normal rate and regular rhythm.  Respiratory: Effort normal, non-labored breathing GI: Soft.  No distension. There is no tenderness.  Skin: WDI  Neuro: Mental Status: Patient is awake, alert, oriented to person, place, month, year, and situation. Speech- naming, repeating, comprehension all intact Patient is able to give a clear and coherent history. Cranial Nerves: II: Visual Fields are full.  III,IV, VI: EOMI without ptosis or diploplia. Pupils equal, round and reactive to light V: Facial sensation is symmetric to temperature VII: Facial movement is symmetric.  VIII: hearing is intact to voice X: Palat elevates symmetrically XI: Shoulder shrug is symmetric. XII: tongue is midline without atrophy or fasciculations.  Motor: Tone is normal. Bulk is normal. 5/5 strength was present in all four extremities.  No drift Positive for very mild aterixis Sensory: Sensation is symmetric to light touch and temperature in the arms and legs.  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  Plantars: Toes are downgoing bilaterally.  Cerebellar: FNF and HKS are intact bilaterally  Labs I have reviewed labs in epic and the results pertinent to this consultation are:   CBC    Component Value Date/Time   WBC 8.2 07/07/2019 0535   RBC 3.99 07/07/2019 0535   HGB 8.7 (L) 07/07/2019 0535   HCT 31.2 (L) 07/07/2019 0535   PLT 149 (L) 07/07/2019 0535   MCV 78.2 (L) 07/07/2019 0535   MCH 21.8 (L) 07/07/2019 0535   MCHC 27.9 (L) 07/07/2019 0535   RDW 18.6 (H) 07/07/2019 0535   LYMPHSABS 2.4 07/06/2019 2247   MONOABS 1.1 (H) 07/06/2019  2247   EOSABS 0.5 07/06/2019 2247   BASOSABS 0.1 07/06/2019 2247    CMP     Component Value Date/Time   NA 137 07/07/2019 0750   K 4.3 07/07/2019 0750   CL 105 07/07/2019 0750   CO2 18 (L) 07/07/2019 0750   GLUCOSE 206 (H) 07/07/2019 0750   BUN 50 (H) 07/07/2019 0750   CREATININE 4.17 (H) 07/07/2019 0750   CALCIUM 8.2 (L) 07/07/2019 0750   PROT 6.8 07/07/2019 0750   ALBUMIN 3.2 (L) 07/07/2019 0750   AST 32 07/07/2019 0750   ALT 26 07/07/2019 0750   ALKPHOS 72 07/07/2019 0750   BILITOT 0.4 07/07/2019 0750   GFRNONAA 10 (L) 07/07/2019 0750   GFRAA 12 (L) 07/07/2019 0750    Lipid Panel  No results found for: CHOL, TRIG, HDL, CHOLHDL, VLDL, LDLCALC, LDLDIRECT   Imaging I have reviewed the images obtained:  CT-scan of the brain-no acute intracranial abnormality  MRI examination of the brain-probable small area of acute infarct left occipital pole.  Patchy hyperintensity in the pons most likely chronic microvascular ischemia  Etta Quill PA-C [facility] [phone]  M-F  (9:00 am- 5:00 PM)  07/07/2019, 2:06 PM     Assessment:  This is a 69 year old female presenting to the hospital with generalized weakness found to be in AKI, dehydrated, weak and slightly confused.  MRI was obtained secondary to confusion which showed a probable small area of acute infarct of the left occipital pole.  Exam at this time shows no localizing lateralizing abnormalities.  Infarct most likely is incidental.  However given patient's stroke risk factors patient would benefit from stroke work-up at this time.  Impression: -Dehydration -Weakness -AKI -Occipital lobe stroke  Recommend #MRA Head and neck  #Transthoracic Echo, and carotid ultrasound  # Start patient on ASA 325mg  daily  #Start or continue Atorvastatin 80 mg/other high intensity statin # BP goal: permissive HTN upto 220/120 mmHg # HBAIC and Lipid profile # Telemetry monitoring # Frequent neuro checks # NPO  until passes stroke swallow screen # please page stroke NP  Or  PA  Or MD from 8am -4 pm  as this patient from this time will be  followed by the stroke.   You can look them up on [email or web address]  Password [email or web address]   NEUROHOSPITALIST ADDENDUM Performed a face to face diagnostic evaluation.   I have reviewed the contents of history and physical exam as documented by PA/ARNP/Resident and agree with above documentation.  I have discussed and formulated the above plan as documented. Edits to the note have been made as needed.  Patient presents with  mild confusion/weakness.  Noted to be dehydrated with AKI and elevated BUN.  MRI  brain shows left occipital lobe infarct with punctate, likely incidental.  Suspect generalized weakness and confusion due to elevated BUN and dehydration  Given cortical location of infarct, recommend obtaining vascular imaging, echocardiogram with bubble study to rule out PFO, blood cultures to rule out endocarditis although patient is afebrile, telemetry to look for paroxysmal atrial fibrillation.    Karena Addison Excell Neyland MD [facility] [phone]   If 7pm to 7am, please call on call as listed on AMION.

## 2019-07-07 NOTE — H&P (Signed)
History and Physical    [name] CBJ:[id number] DOB: Sep 22, 1950 DOA: 07/06/2019  PCP: [name], FNP  Patient coming from: Home.  History obtained from patient's grandson and patient and ER physician and previous records.  Chief Complaint: Weakness and passing out.  HPI: [name] is a 69 y.o. female with history of diabetes mellitus type 2, hypertension was brought to the ER the patient has been feeling weak and passing out.  As per the patient's grandson who lives with the patient over the last 1 week patient has been passing out whenever she tries to ambulate.  Also has been having some diarrhea last few days.  Has not had any vomiting or chest pain fever chills or shortness of breath.  Recently she had new medication Trulicity added to her diabetic medications.  Otherwise her blood pressure medicines and other medications with same.  Primary care physician was contacted and was instructed to bring to the ER.  Patient did complain of some abdominal epigastric discomfort to the ER physician.  ED Course: In the ER patient was hypotensive with blood pressure in the 16W systolic was given fluid bolus following which blood pressure improved.  Labs show significantly increased creatinine of 5.6 from normal last October 2020.  LFTs were largely unremarkable.  Albumin was 2.7.  CBC shows hemoglobin of 10.9 at baseline with mild leukocytosis.  Urine shows started bacteria hyaline casts nitrites with negative leukocyte esterase trace no WBCs.  Patient had CT head which was unremarkable CT abdomen shows features concerning for cirrhosis.  Patient was given fluid bolus admitted for acute renal failure with dehydration and syncopal episode.  EKG shows normal sinus rhythm.  On my exam patient appears mildly confused.  Review of Systems: As per HPI, rest all negative.   Past Medical History:  Diagnosis Date  . Arthritis   . Diabetes mellitus without complication ([place])    . GERD (gastroesophageal reflux disease)   . H/O hiatal hernia   . Headache(784.0)   . Hyperlipidemia   . Hypertension   . Neuromuscular disorder (HCC)    neuropathy feet   . Sleep apnea    recently diagnosed- wears cpap    Past Surgical History:  Procedure Laterality Date  . ABDOMINAL HYSTERECTOMY     partial  . BACK SURGERY    . CHOLECYSTECTOMY    . COLONOSCOPY     age 40- doesnt know where or who   . left shoulder     x 2 surgeries  . right shoulder     x2  . SHOULDER OPEN ROTATOR CUFF REPAIR Right 08/25/2012   Procedure: ROTATOR CUFF REPAIR SHOULDER OPEN WITH ACROMIOPLASTY;  Surgeon: Tobi Bastos, MD;  Location: [facility];  Service: Orthopedics;  Laterality: Right;  . TONSILLECTOMY       reports that she has never smoked. She has never used smokeless tobacco. She reports current alcohol use. She reports that she does not use drugs.  No Known Allergies  Family History  Problem Relation Age of Onset  . Colon cancer Father   . Colon cancer Paternal Grandfather   . Colon cancer Maternal Grandfather   . Bone cancer Maternal Uncle   . Colon polyps Neg Hx   . Esophageal cancer Neg Hx   . Rectal cancer Neg Hx   . Stomach cancer Neg Hx     Prior to Admission medications   Medication Sig Start Date End Date Taking? Authorizing Provider  amLODipine (NORVASC)  10 MG tablet Take 10 mg by mouth daily. 04/15/19  Yes [provider]  atorvastatin (LIPITOR) 20 MG tablet Take 20 mg by mouth daily. 05/03/19  Yes [provider]  cetirizine (ZYRTEC) 10 MG tablet Take 10 mg by mouth daily.  08/31/18  Yes [provider]  cyclobenzaprine (FLEXERIL) 5 MG tablet Take 5 mg by mouth at bedtime.  01/17/19  Yes [provider]  diclofenac sodium (VOLTAREN) 1 % GEL Apply 2 g topically daily.  01/17/19  Yes [provider]  DULoxetine (CYMBALTA) 20 MG capsule Take 20 mg by mouth daily.  01/03/19  Yes [provider]  furosemide (LASIX) 20 MG  tablet Take 20 mg by mouth daily.  09/13/15  Yes [provider]  glimepiride (AMARYL) 4 MG tablet Take 4 mg by mouth daily with breakfast.  11/23/18  Yes [provider]  ipratropium-albuterol (DUONEB) 0.5-2.5 (3) MG/3ML SOLN Take 3 mLs by nebulization 2 (two) times daily. Patient taking differently: Take 3 mLs by nebulization every 6 (six) hours as needed (sob and wheezing).  10/23/15  Yes Barton Dubois, MD  JANUVIA 100 MG tablet Take 100 mg by mouth daily. 04/29/19  Yes [provider]  LANTUS SOLOSTAR 100 UNIT/ML Solostar Pen Inject 30 Units into the skin at bedtime.  01/07/19  Yes [provider]  lisinopril (ZESTRIL) 40 MG tablet Take 40 mg by mouth daily.  12/22/18  Yes [provider]  losartan (COZAAR) 25 MG tablet Take 1 tablet (25 mg total) by mouth daily. 10/23/15  Yes Barton Dubois, MD  LYRICA 200 MG capsule Take 200 mg by mouth 3 (three) times daily. 10/08/17  Yes [provider]  metFORMIN (GLUCOPHAGE) 1000 MG tablet Take 1,000 mg by mouth 2 (two) times daily. 07/19/17  Yes [provider]  omeprazole (PRILOSEC) 20 MG capsule Take 20 mg by mouth daily.  10/05/14  Yes [provider]  ondansetron (ZOFRAN) 4 MG tablet Take 1 tablet (4 mg total) by mouth every 6 (six) hours. Patient taking differently: Take 4 mg by mouth every 8 (eight) hours as needed for nausea or vomiting.  03/20/19  Yes Kendrick, Caitlyn S, PA-C  SYMBICORT 80-4.5 MCG/ACT inhaler Inhale 2 puffs into the lungs 2 (two) times daily. 06/28/19  Yes [provider]  topiramate (TOPAMAX) 100 MG tablet Take 100 mg by mouth daily. 04/29/19  Yes [provider]  TRULICITY 2.40 XB/3.5HG SOPN Take 0.75 mg by mouth every Monday. 06/30/19  Yes [provider]  VENTOLIN HFA 108 (90 Base) MCG/ACT inhaler Inhale 2 puffs into the lungs every 4 (four) hours as needed for wheezing or shortness of breath.  10/01/15  Yes [provider]  Turpin test strip USE BID FOR GLUCOSE TESTING 12/19/14   [provider]    Physical Exam: Constitutional: Moderately built and nourished. Vitals:   07/07/19 0215 07/07/19 0230 07/07/19 0245 07/07/19 0300  BP: (!) 121/56 131/67 131/66 (!) 122/59  Pulse:      Resp: 14 18 14 16   Temp:      TempSrc:      SpO2:      Weight:       Eyes: Anicteric no pallor. ENMT: No discharge from the ears eyes nose or mouth. Neck: No mass or.  No neck rigidity. Respiratory: No rhonchi or crepitations. Cardiovascular: S1-S2 heard. Abdomen: Soft nontender bowel sound present. Musculoskeletal: No edema.  No joint effusion. Skin: No rash. Neurologic: Alert awake oriented to  her name and place.  Moves all extremities. Psychiatric: Mildly confused.   Labs on Admission: I have personally reviewed following labs and imaging studies  CBC: Recent Labs  Lab 07/05/19 1251 07/06/19 2247 07/06/19 2303  WBC 10.8* 7.6  --   NEUTROABS  --  3.5  --   HGB 10.9* 9.1* 9.9*  HCT 39.0 31.9* 29.0*  MCV 77.2* 79.6*  --   PLT 246 162  --    Basic Metabolic Panel: Recent Labs  Lab 07/05/19 1251 07/06/19 2247 07/06/19 2303  NA 137 135 137  K 4.7 4.5 4.4  CL 103 106  --   CO2 18* 14*  --   GLUCOSE 249* 323*  --   BUN 66* 62*  --   CREATININE 5.16* 5.22*  --   CALCIUM 8.8* 7.6*  --    GFR: Estimated Creatinine Clearance: 11.7 mL/min (A) (by C-G formula based on SCr of 5.22 mg/dL (H)). Liver Function Tests: Recent Labs  Lab 07/06/19 2247  AST 34  ALT 21  ALKPHOS 61  BILITOT 0.7  PROT 5.8*  ALBUMIN 2.7*   No results for input(s): LIPASE, AMYLASE in the last 168 hours. Recent Labs  Lab 07/06/19 2250  AMMONIA 20   Coagulation Profile: No results for input(s): INR, PROTIME in the last 168 hours. Cardiac Enzymes: No results for input(s): CKTOTAL, CKMB, CKMBINDEX, TROPONINI in the last 168 hours. BNP (last 3 results) No results for input(s): PROBNP in the last 8760  hours. HbA1C: No results for input(s): HGBA1C in the last 72 hours. CBG: Recent Labs  Lab 07/05/19 1224 07/06/19 2236 07/07/19 0158  GLUCAP 210* 297* 254*   Lipid Profile: No results for input(s): CHOL, HDL, LDLCALC, TRIG, CHOLHDL, LDLDIRECT in the last 72 hours. Thyroid Function Tests: No results for input(s): TSH, T4TOTAL, FREET4, T3FREE, THYROIDAB in the last 72 hours. Anemia Panel: No results for input(s): VITAMINB12, FOLATE, FERRITIN, TIBC, IRON, RETICCTPCT in the last 72 hours. Urine analysis:    Component Value Date/Time   COLORURINE YELLOW 03/20/2019 1551   APPEARANCEUR HAZY (A) 03/20/2019 1551   LABSPEC 1.025 03/20/2019 1551   PHURINE 5.0 03/20/2019 1551   GLUCOSEU >=500 (A) 03/20/2019 1551   HGBUR NEGATIVE 03/20/2019 1551   BILIRUBINUR NEGATIVE 03/20/2019 1551   KETONESUR 5 (A) 03/20/2019 1551   PROTEINUR 30 (A) 03/20/2019 1551   UROBILINOGEN 0.2 08/18/2012 1110   NITRITE NEGATIVE 03/20/2019 1551   LEUKOCYTESUR TRACE (A) 03/20/2019 1551   Sepsis Labs: @LABRCNTIP (procalcitonin:4,lacticidven:4) ) Recent Results (from the past 240 hour(s))  Respiratory Panel by RT PCR (Flu A&B, Covid) - Nasopharyngeal Swab     Status: None   Collection Time: 07/06/19 10:50 PM   Specimen: Nasopharyngeal Swab  Result Value Ref Range Status   SARS Coronavirus 2 by RT PCR NEGATIVE NEGATIVE Final    Comment: (NOTE) SARS-CoV-2 target nucleic acids are NOT DETECTED. The SARS-CoV-2 RNA is generally detectable in upper respiratoy specimens during the acute phase of infection. The lowest concentration of SARS-CoV-2 viral copies this assay can detect is 131 copies/mL. A negative result does not preclude SARS-Cov-2 infection and should not be used as the sole basis for treatment or other patient management decisions. A negative result may occur with  improper specimen collection/handling, submission of specimen other than nasopharyngeal swab, presence of viral mutation(s) within  the areas targeted by this assay, and inadequate number of viral copies (<131 copies/mL). A negative result must be combined with clinical observations, patient history, and epidemiological information. The  expected result is Negative. Fact Sheet for Patients:  PinkCheek.be Fact Sheet for Healthcare Providers:  GravelBags.it This test is not yet ap proved or cleared by the [place] FDA and  has been authorized for detection and/or diagnosis of SARS-CoV-2 by FDA under an Emergency Use Authorization (EUA). This EUA will remain  in effect (meaning this test can be used) for the duration of the COVID-19 declaration under Section 564(b)(1) of the Act, 21 U.S.C. section 360bbb-3(b)(1), unless the authorization is terminated or revoked sooner.    Influenza A by PCR NEGATIVE NEGATIVE Final   Influenza B by PCR NEGATIVE NEGATIVE Final    Comment: (NOTE) The Xpert Xpress SARS-CoV-2/FLU/RSV assay is intended as an aid in  the diagnosis of influenza from Nasopharyngeal swab specimens and  should not be used as a sole basis for treatment. Nasal washings and  aspirates are unacceptable for Xpert Xpress SARS-CoV-2/FLU/RSV  testing. Fact Sheet for Patients: PinkCheek.be Fact Sheet for Healthcare Providers: GravelBags.it This test is not yet approved or cleared by the [place] FDA and  has been authorized for detection and/or diagnosis of SARS-CoV-2 by  FDA under an Emergency Use Authorization (EUA). This EUA will remain  in effect (meaning this test can be used) for the duration of the  Covid-19 declaration under Section 564(b)(1) of the Act, 21  U.S.C. section 360bbb-3(b)(1), unless the authorization is  terminated or revoked. Performed at [facility], [place] [address] [place], [place] [address]      Radiological Exams on Admission: CT ABDOMEN PELVIS WO  CONTRAST  Result Date: 07/07/2019 CLINICAL DATA:  Abdominal pain and distension. EXAM: CT ABDOMEN AND PELVIS WITHOUT CONTRAST TECHNIQUE: Multidetector CT imaging of the abdomen and pelvis was performed following the standard protocol without IV contrast. COMPARISON:  CT 12/24/2010 FINDINGS: Lower chest: Linear atelectasis in the right lower lobe and lingula. Hepatobiliary: Prominent size liver spanning 20 cm cranial caudal. Diffusely decreased hepatic density consistent with steatosis. There are nodular hepatic contours. No evidence of focal lesion. Clips in the gallbladder fossa postcholecystectomy. No biliary dilatation. Pancreas: Fatty atrophy.  No ductal dilatation or inflammation. Spleen: Normal in size without focal abnormality. Adrenals/Urinary Tract: Normal adrenal glands. Slight left renal atrophy. No hydronephrosis. No perinephric edema. No urolithiasis. Urinary bladder is physiologically distended. No bladder wall thickening. Stomach/Bowel: No bowel obstruction, administered enteric contrast reaches the colon. Stomach is mildly distended with enteric contrast. Question of distal paraesophageal varices, series 3, image 15. No definite small bowel wall thickening. Diminutive appendix tentatively visualized, no evidence of appendicitis. Contrast with small volume of stool in the colon. No colonic wall thickening or inflammation. Vascular/Lymphatic: Mild aortic atherosclerosis. No aortic aneurysm. Small periportal nodes are likely reactive. Reproductive: Hysterectomy. Ovaries tentatively visualized and quiescent. No adnexal mass. Other: Small fat containing umbilical hernia. No ascites. No free air. Musculoskeletal: Posterior lumbosacral fusion. Degenerative change of both hips. There are no acute or suspicious osseous abnormalities. IMPRESSION: 1. No acute abnormality in the abdomen/pelvis. 2. Hepatomegaly and hepatic steatosis. Nodular hepatic contours raises concern for cirrhosis. There are questionable  paraesophageal varices, not well assessed in the absence of IV contrast. Aortic Atherosclerosis (ICD10-I70.0). Electronically Signed   By: Keith Rake M.D.   On: 07/07/2019 03:35   DG Chest Port 1 View  Result Date: 07/06/2019 CLINICAL DATA:  Syncope with weakness and fatigue EXAM: PORTABLE CHEST 1 VIEW COMPARISON:  03/20/2019 FINDINGS: The heart size and mediastinal contours are within normal limits. Both lungs are clear. The visualized skeletal  structures are unremarkable. IMPRESSION: No active disease. Electronically Signed   By: Ulyses Jarred M.D.   On: 07/06/2019 23:18    EKG: Independently reviewed.  Normal sinus rhythm.  QTC 475 ms.  Assessment/Plan Principal Problem:   ARF (acute renal failure) (HCC) Active Problems:   OSA (obstructive sleep apnea)   Benign essential HTN   Diabetes mellitus with nephropathy (HCC)   Anemia associated with chronic renal failure    1. Acute renal failure -could be precipitated from recent diarrhea.  Patient is also taking ACE inhibitor or ARB Lasix which will be held for now.  Gently hydrate follow intake output metabolic panel.  CT abdomen does not show any obstruction.  UA only shows some casts.  Check CK levels FENa 2. Syncopal episode likely from hypotensive episode.  EKG shows QTC of 475 ms.  Cardiac markers were negative.  Gently hydrate and check orthostatics. 3. Acute encephalopathy -patient appears mildly confused.  Will check MRI brain.  Closely monitor. 4. Hypertension we will hold off antihypertensives due to hypotension on presentation.  As needed IV hydralazine for now.  Follow blood pressure trends. 5. Diabetes mellitus type 2 on Lantus insulin will decrease the dose because of acute renal failure.  Closely follow CBGs and sliding scale coverage. 6. Anemia appears to be chronic.  Follow CBC. 7. Patient is on Topamax not sure why patient is on.  Discussed with patient's grandson. 8. Hyperlipidemia on statins check CK levels.  Given  that patient has acute renal failure with significantly increased creatinine with hypotensive episodes with syncope will need inpatient status.   DVT prophylaxis: Heparin. Code Status: Full code. Family Communication: Patient's grandson. Disposition Plan: Home. Consults called: None. Admission status: Inpatient.   Rise Patience MD [facility] Pager [phone].  If 7PM-7AM, please contact night-coverage [email or web address] Password [facility]  07/07/2019, 4:23 AM

## 2019-07-07 NOTE — ED Notes (Signed)
Pt up to use bedside commode; had BM and urine. Unable to obtain urinalysis due to contamination

## 2019-07-07 NOTE — Progress Notes (Signed)
  Echocardiogram 2D Echocardiogram has been performed.  [name] 07/07/2019, 2:36 PM

## 2019-07-07 NOTE — Progress Notes (Signed)
Care started prior to midnight in the emergency room and patient admitted early this morning after midnight by my partner and colleague Dr. Gean Birchwood and I am in current agreement with his assessment and plan.  Additional changes of plan of care have been made accordingly.  The patient is an obese 69 year old African-American female with a past medical history significant for but not limited to diabetes mellitus type 2, GERD, hypertension, hyperlipidemia, history of neuropathy of the feet, sleep apnea, arthritis as well as other comorbidities who presented to the ED with a chief complaint of feeling weak and recurrent passing out episodes.  She was a little confused on admission and as per the patient's grandson who lives with her last week she has been passing out whenever she tried to ambulate.  He also has been having diarrhea for last few days and has not had any vomiting or chills or any chest pain.  Recently medication that was added to her regimen was Trulicity.  PCP was contacted and they instructed her to come to the ED for further evaluation and in the ED she was hypotensive with a blood pressure in the 80s and she was given 2 L of fluid boluses.  Labs were significantly deranged and showed an increased creatinine of 5.6 from normal last October.  Urine showed bacteria and hyaline casts and she had a head CT which was unremarkable.  She is given a fluid bolus for her acute renal failure and dehydration and syncopal episode and admitted and was mildly confused.  MRI was done which showed an acute probable CVA so case was discussed with Neurology who could not exclude Infarct so they formally consulted for further evaluation and recommendations.  Renal function is slowly improving and her lactic acid level still elevated but trending down.  Will obtain an echocardiogram and have neuro complete the work-up for acute CVA.  She was hypotensive in the ED so we will hold off on her antihypertensive for  now.  We will continue close blood sugar monitoring for diabetes mellitus type 2..  We will check a hemoglobin A1c and a lipid panel.  X-ray showed no active disease and FOBT was done and was negative.  We will continue to monitor patient's clinical response to intervention and repeat blood work and have further imaging per neurology given her acute CVA.

## 2019-07-08 ENCOUNTER — Inpatient Hospital Stay (HOSPITAL_COMMUNITY): Payer: Medicare Other

## 2019-07-08 ENCOUNTER — Other Ambulatory Visit: Payer: Self-pay | Admitting: Physician Assistant

## 2019-07-08 DIAGNOSIS — I639 Cerebral infarction, unspecified: Secondary | ICD-10-CM

## 2019-07-08 DIAGNOSIS — I6310 Cerebral infarction due to embolism of unspecified precerebral artery: Secondary | ICD-10-CM

## 2019-07-08 DIAGNOSIS — I6340 Cerebral infarction due to embolism of unspecified cerebral artery: Secondary | ICD-10-CM | POA: Insufficient documentation

## 2019-07-08 LAB — COMPREHENSIVE METABOLIC PANEL
ALT: 27 U/L (ref 0–44)
AST: 29 U/L (ref 15–41)
Albumin: 3.3 g/dL — ABNORMAL LOW (ref 3.5–5.0)
Alkaline Phosphatase: 99 U/L (ref 38–126)
Anion gap: 11 (ref 5–15)
BUN: 34 mg/dL — ABNORMAL HIGH (ref 8–23)
CO2: 18 mmol/L — ABNORMAL LOW (ref 22–32)
Calcium: 8.1 mg/dL — ABNORMAL LOW (ref 8.9–10.3)
Chloride: 109 mmol/L (ref 98–111)
Creatinine, Ser: 2.62 mg/dL — ABNORMAL HIGH (ref 0.44–1.00)
GFR calc Af Amer: 21 mL/min — ABNORMAL LOW (ref >60)
GFR calc non Af Amer: 18 mL/min — ABNORMAL LOW (ref >60)
Glucose, Bld: 253 mg/dL — ABNORMAL HIGH (ref 70–99)
Potassium: 3.8 mmol/L (ref 3.5–5.1)
Sodium: 138 mmol/L (ref 135–145)
Total Bilirubin: 0.8 mg/dL (ref 0.3–1.2)
Total Protein: 7.1 g/dL (ref 6.5–8.1)

## 2019-07-08 LAB — CBC WITH DIFFERENTIAL/PLATELET
Abs Immature Granulocytes: 0.01 10*3/uL (ref 0.00–0.07)
Basophils Absolute: 0.1 10*3/uL (ref 0.0–0.1)
Basophils Relative: 1 %
Eosinophils Absolute: 1 10*3/uL — ABNORMAL HIGH (ref 0.0–0.5)
Eosinophils Relative: 15 %
HCT: 32.8 % — ABNORMAL LOW (ref 36.0–46.0)
Hemoglobin: 9.7 g/dL — ABNORMAL LOW (ref 12.0–15.0)
Immature Granulocytes: 0 %
Lymphocytes Relative: 35 %
Lymphs Abs: 2.3 10*3/uL (ref 0.7–4.0)
MCH: 22.1 pg — ABNORMAL LOW (ref 26.0–34.0)
MCHC: 29.6 g/dL — ABNORMAL LOW (ref 30.0–36.0)
MCV: 74.7 fL — ABNORMAL LOW (ref 80.0–100.0)
Monocytes Absolute: 0.7 10*3/uL (ref 0.1–1.0)
Monocytes Relative: 11 %
Neutro Abs: 2.4 10*3/uL (ref 1.7–7.7)
Neutrophils Relative %: 38 %
Platelets: 154 10*3/uL (ref 150–400)
RBC: 4.39 MIL/uL (ref 3.87–5.11)
RDW: 18.9 % — ABNORMAL HIGH (ref 11.5–15.5)
WBC: 6.5 10*3/uL (ref 4.0–10.5)
nRBC: 0 % (ref 0.0–0.2)

## 2019-07-08 LAB — LIPID PANEL
Cholesterol: 91 mg/dL (ref 0–200)
HDL: 32 mg/dL — ABNORMAL LOW
LDL Cholesterol: 20 mg/dL (ref 0–99)
Total CHOL/HDL Ratio: 2.8 ratio
Triglycerides: 196 mg/dL — ABNORMAL HIGH
VLDL: 39 mg/dL (ref 0–40)

## 2019-07-08 LAB — GLUCOSE, CAPILLARY
Glucose-Capillary: 237 mg/dL — ABNORMAL HIGH (ref 70–99)
Glucose-Capillary: 284 mg/dL — ABNORMAL HIGH (ref 70–99)
Glucose-Capillary: 270 mg/dL — ABNORMAL HIGH (ref 70–99)
Glucose-Capillary: 236 mg/dL — ABNORMAL HIGH (ref 70–99)

## 2019-07-08 LAB — HEMOGLOBIN A1C
Hgb A1c MFr Bld: 12.3 % — ABNORMAL HIGH (ref 4.8–5.6)
Mean Plasma Glucose: 306.31 mg/dL

## 2019-07-08 LAB — MAGNESIUM: Magnesium: 1.4 mg/dL — ABNORMAL LOW (ref 1.7–2.4)

## 2019-07-08 LAB — PHOSPHORUS: Phosphorus: 3 mg/dL (ref 2.5–4.6)

## 2019-07-08 MED ORDER — SODIUM CHLORIDE 0.45 % IV SOLN: INTRAVENOUS | Status: DC

## 2019-07-08 MED ORDER — MAGNESIUM SULFATE 2 GM/50ML IV SOLN
2.0000 g | Freq: Once | INTRAVENOUS | Status: AC
  Administered 2019-07-08: 15:00:00 2 g via INTRAVENOUS
  Filled 2019-07-08: qty 50

## 2019-07-08 MED ORDER — ASPIRIN EC 81 MG PO TBEC
81.0000 mg | DELAYED_RELEASE_TABLET | Freq: Every day | ORAL | Status: DC
  Administered 2019-07-08 – 2019-07-12 (×5): 81 mg via ORAL
  Filled 2019-07-08 (×5): qty 1

## 2019-07-08 MED ORDER — CLOPIDOGREL BISULFATE 75 MG PO TABS
75.0000 mg | ORAL_TABLET | Freq: Every day | ORAL | Status: DC
  Administered 2019-07-08 – 2019-07-11 (×4): 75 mg via ORAL
  Filled 2019-07-08 (×5): qty 1

## 2019-07-08 NOTE — Progress Notes (Signed)
TCD and Carotid duplex has been completed.   Preliminary results in CV Proc.   Abram Sander 07/08/2019 2:39 PM

## 2019-07-08 NOTE — Evaluation (Signed)
Clinical/Bedside Swallow Evaluation Patient Details  Name: [name] MRN: [id number] Date of Birth: [birth year]  Today's Date: 07/08/2019 Time: SLP Start Time (ACUTE ONLY): 0951 SLP Stop Time (ACUTE ONLY): 1005 SLP Time Calculation (min) (ACUTE ONLY): 14 min  Past Medical History:  Past Medical History:  Diagnosis Date  . Arthritis   . Diabetes mellitus without complication ([place])   . GERD (gastroesophageal reflux disease)   . H/O hiatal hernia   . Headache(784.0)   . Hyperlipidemia   . Hypertension   . Neuromuscular disorder (HCC)    neuropathy feet   . Sleep apnea    recently diagnosed- wears cpap   Past Surgical History:  Past Surgical History:  Procedure Laterality Date  . ABDOMINAL HYSTERECTOMY     partial  . BACK SURGERY    . CHOLECYSTECTOMY    . COLONOSCOPY     age 89- doesnt know where or who   . left shoulder     x 2 surgeries  . right shoulder     x2  . SHOULDER OPEN ROTATOR CUFF REPAIR Right 08/25/2012   Procedure: ROTATOR CUFF REPAIR SHOULDER OPEN WITH ACROMIOPLASTY;  Surgeon: Tobi Bastos, MD;  Location: [facility];  Service: Orthopedics;  Laterality: Right;  . TONSILLECTOMY     HPI:  [name] is a 69 y.o. female with history of sleep apnea, neuropathy, hypertension, hyperlipidemia, headaches and diabetes.  Patient presented to the hospital secondary to weakness.  During hospitalization, patient had MRI which shows a punctate infarct. Pt self-reports a hx of GERD.    Assessment / Plan / Recommendation Clinical Impression   Patient received at bedside for bedside swallow evaluation. Patient alert, oriented x4. Oral mechanism exam unremarkable. Pt with natural lower dentition and upper dentures. Patient seen with thin liquids via straw and cup sips, puree (applesauce) and regular solids (graham cracker). Pt with adequate oral acceptance, mastication, swallow initiation appeared timely. No overt s/s aspiration observed. Patient denies  coughing/choking, globus sensation. Pt reports she has GERD and takes a medication for it, but cannot recall name of medication. Recommend regular/thin liquids diet. No further dysphagia therapy is indicated at this time. Please re-consult should patient exhibit s/sx dysphagia.  SLP Visit Diagnosis: Dysphagia, unspecified (R13.10)    Aspiration Risk  No limitations    Diet Recommendation Regular;Thin liquid        Other  Recommendations Oral Care Recommendations: Oral care BID   Follow up Recommendations None      Frequency and Duration            Prognosis        Swallow Study   General HPI: [name] is a 69 y.o. female with history of sleep apnea, neuropathy, hypertension, hyperlipidemia, headaches and diabetes.  Patient presented to the hospital secondary to weakness.  During hospitalization, patient had MRI which shows a punctate infarct. Pt self-reports a hx of GERD.  Type of Study: Bedside Swallow Evaluation Diet Prior to this Study: Regular;Thin liquids Temperature Spikes Noted: No Respiratory Status: Room air Behavior/Cognition: Alert;Cooperative Oral Cavity Assessment: Within Functional Limits Oral Cavity - Dentition: Dentures, top Vision: Functional for self-feeding Self-Feeding Abilities: Able to feed self Patient Positioning: Upright in bed Baseline Vocal Quality: Normal Volitional Cough: Strong Volitional Swallow: Able to elicit    Oral/Motor/Sensory Function Overall Oral Motor/Sensory Function: Within functional limits   Ice Chips Ice chips: Not tested   Thin Liquid Thin Liquid: Within functional limits Presentation: Cup;Straw;Self Fed    Nectar  Thick Nectar Thick Liquid: Not tested   Honey Thick Honey Thick Liquid: Not tested   Puree Puree: Within functional limits   Solid     Solid: Within functional limits     Marina Goodell, M.Ed., CCC-SLP Speech Therapy Acute Rehabilitation [phone]: Acute Rehab office [phone] -  pager   [name] 07/08/2019,10:45 AM

## 2019-07-08 NOTE — Evaluation (Addendum)
Occupational Therapy Evaluation Patient Details Name: [name] MRN: [id number] DOB: [birth year] Today's Date: 07/08/2019    History of Present Illness Pt is a 69 y.o. female admitted 07/06/19 with weakness and "passing out whenever she tries to walk" per grandson. Pt found to have hypotension, AKI with dehydration. 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 PMH includes DM2, HTN.   Clinical Impression   Patient is a 69 year old female that lives alone in a single level home with no steps to enter. Patient reports her grandson is "in and out" of the house, assists with IADLs grocery shopping, transportation. At baseline patient is modified independent with self care using her walker. Currently patient is supervision level with moderate cues for safety with body mechanics during transfers and participating in self care tasks. Recommend continued acute OT services to maximize patient safety with ADL/IADL tasks, no follow up services recommended.   Orthostatic vitals taken Supine: 140/70 Sitting: 150/73 Standing: 143/82   Follow Up Recommendations  No OT follow up;Supervision - Intermittent    Equipment Recommendations  Tub/shower bench       Precautions / Restrictions Precautions Precautions: Fall Restrictions Weight Bearing Restrictions: No      Mobility Bed Mobility Overal bed mobility: Modified Independent             General bed mobility comments: HOB elevated, increased time  Transfers Overall transfer level: Needs assistance Equipment used: Rolling walker (2 wheeled) Transfers: Sit to/from Stand Sit to Stand: Supervision         General transfer comment: moderate cues for safety with body mechanics   Modified Rankin (Stroke Patients Only) Modified Rankin (Stroke Patients Only) Pre-Morbid Rankin Score: No symptoms Modified Rankin: No symptoms    Balance Overall balance assessment: Mild deficits observed, not formally tested                                          ADL either performed or assessed with clinical judgement   ADL Overall ADL's : Needs assistance/impaired Eating/Feeding: Independent   Grooming: Supervision/safety;Oral care;Wash/dry face;Wash/dry hands;Standing   Upper Body Bathing: Set up;Sitting   Lower Body Bathing: Sit to/from stand   Upper Body Dressing : Set up;Sitting   Lower Body Dressing: Set up;Supervision/safety;Sit to/from stand;Sitting/lateral leans Lower Body Dressing Details (indicate cue type and reason): pt able to doff/don socks seated at edge of bed without physical assist Toilet Transfer: Supervision/safety;Cueing for safety;Regular Toilet;RW;Ambulation Toilet Transfer Details (indicate cue type and reason): verbal cues for safety with body mechanics Toileting- Clothing Manipulation and Hygiene: Supervision/safety;Sitting/lateral lean;Sit to/from stand       Functional mobility during ADLs: Supervision/safety;Rolling walker;Cueing for safety General ADL Comments: mild safety deficits noted with functional mobility and transfers during self care tasks     Vision Baseline Vision/History: Wears glasses Wears Glasses: At all times Patient Visual Report: No change from baseline Vision Assessment?: No apparent visual deficits Additional Comments: patient is able to read signs in the room with glasses on            Pertinent Vitals/Pain Pain Assessment: No/denies pain     Hand Dominance Right   Extremity/Trunk Assessment Upper Extremity Assessment Upper Extremity Assessment: Overall WFL for tasks assessed;RUE deficits/detail;LUE deficits/detail RUE Deficits / Details: patient has hisstory of R UE fracture resulting in R elbow deformity  RUE Coordination: WNL LUE Coordination: WNL   Lower  Extremity Assessment Lower Extremity Assessment: Defer to PT evaluation       Communication Communication Communication: No difficulties   Cognition  Arousal/Alertness: Awake/alert Behavior During Therapy: WFL for tasks assessed/performed Overall Cognitive Status: Within Functional Limits for tasks assessed                                 General Comments: A/Ox4, verbal cues for safety              Home Living Family/patient expects to be discharged to:: Private residence Living Arrangements: Alone Available Help at Discharge: Family;Available 24 hours/day Type of Home: House Home Access: Level entry     Home Layout: One level     Bathroom Shower/Tub: Teacher, early years/pre: Standard     Home Equipment: Grab bars - tub/shower;Walker - 2 wheels;Walker - 4 wheels;Cane - single point   Additional Comments: pt reports her grandson is "in and out" of the house, has additional family in the area that check on her      Prior Functioning/Environment Level of Independence: Independent with assistive device(s)        Comments: grandson assits with IADLs grocery shopping, transportation as patient does not drive        OT Problem List: Impaired balance (sitting and/or standing);Decreased safety awareness;Obesity      OT Treatment/Interventions: Self-care/ADL training;Therapeutic activities;Patient/family education;Balance training    OT Goals(Current goals can be found in the care plan section) Acute Rehab OT Goals Patient Stated Goal: to go home OT Goal Formulation: With patient Time For Goal Achievement: 07/22/19 Potential to Achieve Goals: Good  OT Frequency: Min 2X/week    AM-PAC OT "6 Clicks" Daily Activity     Outcome Measure Help from another person eating meals?: None Help from another person taking care of personal grooming?: A Little Help from another person toileting, which includes using toliet, bedpan, or urinal?: A Little Help from another person bathing (including washing, rinsing, drying)?: A Little Help from another person to put on and taking off regular upper body  clothing?: A Little Help from another person to put on and taking off regular lower body clothing?: A Little 6 Click Score: 19   End of Session Equipment Utilized During Treatment: Rolling walker Nurse Communication: Mobility status  Activity Tolerance: Patient tolerated treatment well Patient left: in bed;with call bell/phone within reach  OT Visit Diagnosis: Unsteadiness on feet (R26.81)                Time: 9147-8295 OT Time Calculation (min): 43 min Charges:  OT General Charges $OT Visit: 1 Visit OT Evaluation $OT Eval Moderate Complexity: 1 Mod OT Treatments $Self Care/Home Management : 23-37 mins  Shon Millet OT OT office: [place] 07/08/2019, 11:02 AM

## 2019-07-08 NOTE — Progress Notes (Signed)
PROGRESS NOTE    [name]  POE:[id number] DOB: [birth year] DOA: 07/06/2019 PCP: [name], FNP    Brief Narrative:  HPI per Dr. Hal Hope: Delma Drone is a 69 y.o. female with history of diabetes mellitus type 2, hypertension was brought to the ER the patient has been feeling weak and passing out.  As per the patient's grandson who lives with the patient over the last 1 week patient has been passing out whenever she tries to ambulate.  Also has been having some diarrhea last few days.  Has not had any vomiting or chest pain fever chills or shortness of breath.  Recently she had new medication Trulicity added to her diabetic medications.  Otherwise her blood pressure medicines and other medications with same.  Primary care physician was contacted and was instructed to bring to the ER.  Patient did complain of some abdominal epigastric discomfort to the ER physician.  ED Course: In the ER patient was hypotensive with blood pressure in the 31V systolic was given fluid bolus following which blood pressure improved.  Labs show significantly increased creatinine of 5.6 from normal last October 2020.  LFTs were largely unremarkable.  Albumin was 2.7.  CBC shows hemoglobin of 10.9 at baseline with mild leukocytosis.  Urine shows started bacteria hyaline casts nitrites with negative leukocyte esterase trace no WBCs.  Patient had CT head which was unremarkable CT abdomen shows features concerning for cirrhosis.  Patient was given fluid bolus admitted for acute renal failure with dehydration and syncopal episode.  EKG shows normal sinus rhythm.  On my exam patient appears mildly confused   Assessment & Plan:   Principal Problem:   ARF (acute renal failure) (HCC) Active Problems:   OSA (obstructive sleep apnea)   Benign essential HTN   Diabetes mellitus with nephropathy (HCC)   Anemia associated with chronic renal failure   Cerebral embolism with cerebral  infarction   1. Acute kidney injury.  Likely precipitated from recent diarrhea and ACE inhibitor use.  She is being hydrated with IV fluids.  CT abdomen did not show any hydronephrosis.  CK levels were unremarkable.  Overall renal function is improving. 2. Patchy left occipital infarct, embolic, secondary to unknown etiology.  Seen by neurology with recommendations for aspirin and Plavix for 3 weeks, followed by aspirin alone.  Echocardiogram was unrevealing.  Neurology has recommended aggressive risk factor modifications.  Continue on statin.  Due to concerns for paroxysmal atrial fibrillation, patient has been set up with cardiology clinic to have a 30-day event monitor.  The clinic will contact her once this is been approved by her insurance.  She will follow up with the stroke clinic as well. 3. Syncopal episode.  Likely related to hypotension/dehydration on admission.  No further episodes since admission. 4. Diabetes.  Continue on Lantus and supplement with sliding scale insulin. 5. Hyperlipidemia.  Continue statin   DVT prophylaxis: Heparin Code Status: Full code Family Communication: None present Disposition Plan: Anticipate discharge home in the next 24 hours if renal function continues to improve   Consultants:   Neurology  Procedures:  Echo:1. Left ventricular ejection fraction, by visual estimation, is 65 to 70%. The left ventricle has hyperdynamic function. There is no left ventricular hypertrophy.  2. Left ventricular diastolic parameters are consistent with Grade I diastolic dysfunction (impaired relaxation).  3. The left ventricle has no regional wall motion abnormalities.  4. Global right ventricle has normal systolic function.The right ventricular size is normal. No  increase in right ventricular wall thickness.  5. Left atrial size was normal.  6. Right atrial size was normal.  7. The mitral valve is normal in structure. No evidence of mitral valve regurgitation. No  evidence of mitral stenosis.  8. The tricuspid valve is normal in structure. Tricuspid valve regurgitation is not demonstrated.  9. The aortic valve is tricuspid. Mild aortic valve stenosis. Mean gradient 11 mmHg. 10. The inferior vena cava is normal in size with greater than 50% respiratory variability, suggesting right atrial pressure of 3 mmHg.  11. TR signal is inadequate for assessing pulmonary artery systolic pressure.  Antimicrobials:      Subjective: She does feel weak on standing.  Overall she is feeling better since admission.  Urine output is improving.  Objective: Vitals:   07/07/19 2210 07/08/19 0604 07/08/19 0920 07/08/19 1441  BP: (!) 115/57 (!) 141/65 133/61 137/67  Pulse: 86 77 82 83  Resp: 15 15 18 18   Temp: 97.6 F (36.4 C) 97.9 F (36.6 C) 98.4 F (36.9 C) 98.3 F (36.8 C)  TempSrc: Oral Oral Oral Oral  SpO2: 98% 98% 99% 98%  Weight:        Intake/Output Summary (Last 24 hours) at 07/08/2019 2039 Last data filed at 07/08/2019 1703 Gross per 24 hour  Intake 191.73 ml  Output --  Net 191.73 ml   Filed Weights   07/06/19 2345  Weight: 93.2 kg    Examination:  General exam: Appears calm and comfortable  Respiratory system: Clear to auscultation. Respiratory effort normal. Cardiovascular system: S1 & S2 heard, RRR. No JVD, murmurs, rubs, gallops or clicks. No pedal edema. Gastrointestinal system: Abdomen is nondistended, soft and nontender. No organomegaly or masses felt. Normal bowel sounds heard. Central nervous system: Alert and oriented. No focal neurological deficits. Extremities: Symmetric 5 x 5 power. Skin: No rashes, lesions or ulcers Psychiatry: Judgement and insight appear normal. Mood & affect appropriate.     Data Reviewed: I have personally reviewed following labs and imaging studies  CBC: Recent Labs  Lab 07/05/19 1251 07/06/19 2247 07/06/19 2303 07/07/19 0535 07/08/19 0658  WBC 10.8* 7.6  --  8.2 6.5  NEUTROABS  --  3.5   --   --  2.4  HGB 10.9* 9.1* 9.9* 8.7* 9.7*  HCT 39.0 31.9* 29.0* 31.2* 32.8*  MCV 77.2* 79.6*  --  78.2* 74.7*  PLT 246 162  --  149* 676   Basic Metabolic Panel: Recent Labs  Lab 07/05/19 1251 07/06/19 2247 07/06/19 2303 07/07/19 0535 07/07/19 0750 07/08/19 0658  NA 137 135 137  --  137 138  K 4.7 4.5 4.4  --  4.3 3.8  CL 103 106  --   --  105 109  CO2 18* 14*  --   --  18* 18*  GLUCOSE 249* 323*  --   --  206* 253*  BUN 66* 62*  --   --  50* 34*  CREATININE 5.16* 5.22*  --  3.89* 4.17* 2.62*  CALCIUM 8.8* 7.6*  --   --  8.2* 8.1*  MG  --   --   --   --   --  1.4*  PHOS  --   --   --   --   --  3.0   GFR: Estimated Creatinine Clearance: 23.4 mL/min (A) (by C-G formula based on SCr of 2.62 mg/dL (H)). Liver Function Tests: Recent Labs  Lab 07/06/19 2247 07/07/19 0750 07/08/19 0658  AST 34 32  29  ALT 21 26 27   ALKPHOS 61 72 99  BILITOT 0.7 0.4 0.8  PROT 5.8* 6.8 7.1  ALBUMIN 2.7* 3.2* 3.3*   No results for input(s): LIPASE, AMYLASE in the last 168 hours. Recent Labs  Lab 07/06/19 2250  AMMONIA 20   Coagulation Profile: No results for input(s): INR, PROTIME in the last 168 hours. Cardiac Enzymes: No results for input(s): CKTOTAL, CKMB, CKMBINDEX, TROPONINI in the last 168 hours. BNP (last 3 results) No results for input(s): PROBNP in the last 8760 hours. HbA1C: Recent Labs    07/08/19 0658  HGBA1C 12.3*   CBG: Recent Labs  Lab 07/07/19 1614 07/07/19 2211 07/08/19 0602 07/08/19 1039 07/08/19 1641  GLUCAP 234* 208* 237* 284* 270*   Lipid Profile: Recent Labs    07/08/19 0658  CHOL 91  HDL 32*  LDLCALC 20  TRIG 196*  CHOLHDL 2.8   Thyroid Function Tests: No results for input(s): TSH, T4TOTAL, FREET4, T3FREE, THYROIDAB in the last 72 hours. Anemia Panel: No results for input(s): VITAMINB12, FOLATE, FERRITIN, TIBC, IRON, RETICCTPCT in the last 72 hours. Sepsis Labs: Recent Labs  Lab 07/06/19 2247 07/07/19 0159 07/07/19 0750  07/07/19 0916  LATICACIDVEN 6.4* 4.4* 3.7* 3.9*    Recent Results (from the past 240 hour(s))  Respiratory Panel by RT PCR (Flu A&B, Covid) - Nasopharyngeal Swab     Status: None   Collection Time: 07/06/19 10:50 PM   Specimen: Nasopharyngeal Swab  Result Value Ref Range Status   SARS Coronavirus 2 by RT PCR NEGATIVE NEGATIVE Final    Comment: (NOTE) SARS-CoV-2 target nucleic acids are NOT DETECTED. The SARS-CoV-2 RNA is generally detectable in upper respiratoy specimens during the acute phase of infection. The lowest concentration of SARS-CoV-2 viral copies this assay can detect is 131 copies/mL. A negative result does not preclude SARS-Cov-2 infection and should not be used as the sole basis for treatment or other patient management decisions. A negative result may occur with  improper specimen collection/handling, submission of specimen other than nasopharyngeal swab, presence of viral mutation(s) within the areas targeted by this assay, and inadequate number of viral copies (<131 copies/mL). A negative result must be combined with clinical observations, patient history, and epidemiological information. The expected result is Negative. Fact Sheet for Patients:  PinkCheek.be Fact Sheet for Healthcare Providers:  GravelBags.it This test is not yet ap proved or cleared by the [place] FDA and  has been authorized for detection and/or diagnosis of SARS-CoV-2 by FDA under an Emergency Use Authorization (EUA). This EUA will remain  in effect (meaning this test can be used) for the duration of the COVID-19 declaration under Section 564(b)(1) of the Act, 21 U.S.C. section 360bbb-3(b)(1), unless the authorization is terminated or revoked sooner.    Influenza A by PCR NEGATIVE NEGATIVE Final   Influenza B by PCR NEGATIVE NEGATIVE Final    Comment: (NOTE) The Xpert Xpress SARS-CoV-2/FLU/RSV assay is intended as an aid in   the diagnosis of influenza from Nasopharyngeal swab specimens and  should not be used as a sole basis for treatment. Nasal washings and  aspirates are unacceptable for Xpert Xpress SARS-CoV-2/FLU/RSV  testing. Fact Sheet for Patients: PinkCheek.be Fact Sheet for Healthcare Providers: GravelBags.it This test is not yet approved or cleared by the [place] FDA and  has been authorized for detection and/or diagnosis of SARS-CoV-2 by  FDA under an Emergency Use Authorization (EUA). This EUA will remain  in effect (meaning this test can be used) for  the duration of the  Covid-19 declaration under Section 564(b)(1) of the Act, 21  U.S.C. section 360bbb-3(b)(1), unless the authorization is  terminated or revoked. Performed at [facility], [place] [address], [place], [place] [address]   Culture, blood (Routine X 2) w Reflex to ID Panel     Status: None (Preliminary result)   Collection Time: 07/08/19  7:35 AM   Specimen: BLOOD LEFT HAND  Result Value Ref Range Status   Specimen Description BLOOD LEFT HAND  Final   Special Requests   Final    BOTTLES DRAWN AEROBIC AND ANAEROBIC Blood Culture results may not be optimal due to an inadequate volume of blood received in culture bottles   Culture   Final    NO GROWTH < 12 HOURS Performed at [facility], [place] [address], [place], [place] [address]    Report Status PENDING  Incomplete  Culture, blood (Routine X 2) w Reflex to ID Panel     Status: None (Preliminary result)   Collection Time: 07/08/19  7:47 AM   Specimen: BLOOD RIGHT HAND  Result Value Ref Range Status   Specimen Description BLOOD RIGHT HAND  Final   Special Requests   Final    BOTTLES DRAWN AEROBIC ONLY Blood Culture results may not be optimal due to an inadequate volume of blood received in culture bottles   Culture   Final    NO GROWTH < 12 HOURS Performed at [facility], [place] [address],  [place], [place] [address]    Report Status PENDING  Incomplete         Radiology Studies: CT ABDOMEN PELVIS WO CONTRAST  Result Date: 07/07/2019 CLINICAL DATA:  Abdominal pain and distension. EXAM: CT ABDOMEN AND PELVIS WITHOUT CONTRAST TECHNIQUE: Multidetector CT imaging of the abdomen and pelvis was performed following the standard protocol without IV contrast. COMPARISON:  CT 12/24/2010 FINDINGS: Lower chest: Linear atelectasis in the right lower lobe and lingula. Hepatobiliary: Prominent size liver spanning 20 cm cranial caudal. Diffusely decreased hepatic density consistent with steatosis. There are nodular hepatic contours. No evidence of focal lesion. Clips in the gallbladder fossa postcholecystectomy. No biliary dilatation. Pancreas: Fatty atrophy.  No ductal dilatation or inflammation. Spleen: Normal in size without focal abnormality. Adrenals/Urinary Tract: Normal adrenal glands. Slight left renal atrophy. No hydronephrosis. No perinephric edema. No urolithiasis. Urinary bladder is physiologically distended. No bladder wall thickening. Stomach/Bowel: No bowel obstruction, administered enteric contrast reaches the colon. Stomach is mildly distended with enteric contrast. Question of distal paraesophageal varices, series 3, image 15. No definite small bowel wall thickening. Diminutive appendix tentatively visualized, no evidence of appendicitis. Contrast with small volume of stool in the colon. No colonic wall thickening or inflammation. Vascular/Lymphatic: Mild aortic atherosclerosis. No aortic aneurysm. Small periportal nodes are likely reactive. Reproductive: Hysterectomy. Ovaries tentatively visualized and quiescent. No adnexal mass. Other: Small fat containing umbilical hernia. No ascites. No free air. Musculoskeletal: Posterior lumbosacral fusion. Degenerative change of both hips. There are no acute or suspicious osseous abnormalities. IMPRESSION: 1. No acute abnormality in the abdomen/pelvis.  2. Hepatomegaly and hepatic steatosis. Nodular hepatic contours raises concern for cirrhosis. There are questionable paraesophageal varices, not well assessed in the absence of IV contrast. Aortic Atherosclerosis (ICD10-I70.0). Electronically Signed   By: Keith Rake M.D.   On: 07/07/2019 03:35   CT HEAD WO CONTRAST  Result Date: 07/07/2019 CLINICAL DATA:  Ataxia. EXAM: CT HEAD WITHOUT CONTRAST TECHNIQUE: Contiguous axial images were obtained from the  base of the skull through the vertex without intravenous contrast. COMPARISON:  Head CT 05/31/2019 FINDINGS: Brain: Normal brain volume for age. No intracranial hemorrhage, mass effect, or midline shift. No hydrocephalus. The basilar cisterns are patent. No evidence of territorial infarct or acute ischemia. No extra-axial or intracranial fluid collection. Vascular: No hyperdense vessel or unexpected calcification. Skull: No fracture or focal lesion. Sinuses/Orbits: Chronic left sphenoid sinus sinusitis. Acute findings. Other: None. IMPRESSION: 1. No acute intracranial abnormality. 2. Chronic left sphenoid sinusitis. Electronically Signed   By: Keith Rake M.D.   On: 07/07/2019 05:18   MR BRAIN WO CONTRAST  Result Date: 07/07/2019 CLINICAL DATA:  Encephalopathy.  Hypertension diabetes. EXAM: MRI HEAD WITHOUT CONTRAST TECHNIQUE: Multiplanar, multiecho pulse sequences of the brain and surrounding structures were obtained without intravenous contrast. COMPARISON:  CT head 07/07/2019 FINDINGS: Brain: Probable small area of acute infarct left occipital pole measuring 5 mm. No other acute infarct. Ventricle size normal. Patchy hyperintensity in the pons bilaterally. No prior MRI for comparison. Probable chronic microvascular ischemic change. Negative for hemorrhage or mass. Remainder of the cerebral white matter normal. Vascular: Normal arterial flow voids. Skull and upper cervical spine: No focal skeletal lesion. Sinuses/Orbits: Mucosal edema left sphenoid  sinus.  Normal orbit Other: None IMPRESSION: Probable small area of acute infarct left occipital pole Patchy hyperintensity in the pons most likely chronic microvascular ischemia. Electronically Signed   By: Franchot Gallo M.D.   On: 07/07/2019 09:08   DG Chest Port 1 View  Result Date: 07/06/2019 CLINICAL DATA:  Syncope with weakness and fatigue EXAM: PORTABLE CHEST 1 VIEW COMPARISON:  03/20/2019 FINDINGS: The heart size and mediastinal contours are within normal limits. Both lungs are clear. The visualized skeletal structures are unremarkable. IMPRESSION: No active disease. Electronically Signed   By: Ulyses Jarred M.D.   On: 07/06/2019 23:18   ECHOCARDIOGRAM COMPLETE  Result Date: 07/07/2019   ECHOCARDIOGRAM REPORT   Patient Name:   ROSALAND SHIFFMAN [facility] Date of Exam: 07/07/2019 Medical Rec #:  [id number]               Height:       64.0 in Accession #:    [id number]              Weight:       205.5 lb Date of Birth:  05/11/1951               BSA:          1.98 m Patient Age:    48 years                BP:           117/54 mmHg Patient Gender: F                       HR:           47 bpm. Exam Location:  Inpatient Procedure: 2D Echo Indications:    syncope 780.2  History:        Patient has prior history of Echocardiogram examinations, most                 recent 06/22/2018. Risk Factors:Diabetes and Hypertension.  Sonographer:    Johny Chess Referring Phys: [id number] OMAIR LATIF [place]  1. Left ventricular ejection fraction, by visual estimation, is 65 to 70%. The left ventricle has hyperdynamic function. There is no left ventricular hypertrophy.  2. Left ventricular diastolic parameters are  consistent with Grade I diastolic dysfunction (impaired relaxation).  3. The left ventricle has no regional wall motion abnormalities.  4. Global right ventricle has normal systolic function.The right ventricular size is normal. No increase in right ventricular wall thickness.  5. Left atrial size was  normal.  6. Right atrial size was normal.  7. The mitral valve is normal in structure. No evidence of mitral valve regurgitation. No evidence of mitral stenosis.  8. The tricuspid valve is normal in structure. Tricuspid valve regurgitation is not demonstrated.  9. The aortic valve is tricuspid. Mild aortic valve stenosis. Mean gradient 11 mmHg. 10. The inferior vena cava is normal in size with greater than 50% respiratory variability, suggesting right atrial pressure of 3 mmHg. 11. TR signal is inadequate for assessing pulmonary artery systolic pressure. FINDINGS  Left Ventricle: Left ventricular ejection fraction, by visual estimation, is 65 to 70%. The left ventricle has hyperdynamic function. The left ventricle has no regional wall motion abnormalities. There is no left ventricular hypertrophy. Left ventricular diastolic parameters are consistent with Grade I diastolic dysfunction (impaired relaxation). Right Ventricle: The right ventricular size is normal. No increase in right ventricular wall thickness. Global RV systolic function is has normal systolic function. Left Atrium: Left atrial size was normal in size. Right Atrium: Right atrial size was normal in size Pericardium: There is no evidence of pericardial effusion. Mitral Valve: The mitral valve is normal in structure. No evidence of mitral valve regurgitation. No evidence of mitral valve stenosis by observation. Tricuspid Valve: The tricuspid valve is normal in structure. Tricuspid valve regurgitation is not demonstrated. Aortic Valve: The aortic valve is tricuspid. Aortic valve regurgitation is not visualized. Mild aortic stenosis is present. Pulmonic Valve: The pulmonic valve was normal in structure. Pulmonic valve regurgitation is not visualized. Pulmonic regurgitation is not visualized. Aorta: The aortic root is normal in size and structure. Venous: The inferior vena cava is normal in size with greater than 50% respiratory variability, suggesting  right atrial pressure of 3 mmHg. IAS/Shunts: No atrial level shunt detected by color flow Doppler.  LEFT VENTRICLE PLAX 2D LVIDd:         4.90 cm  Diastology LVIDs:         2.60 cm  LV e' lateral:   12.00 cm/s LV PW:         1.00 cm  LV E/e' lateral: 8.4 LV IVS:        0.80 cm  LV e' medial:    8.38 cm/s LVOT diam:     2.00 cm  LV E/e' medial:  12.1 LV SV:         88 ml LV SV Index:   42.16 LVOT Area:     3.14 cm  RIGHT VENTRICLE RV S prime:     21.10 cm/s TAPSE (M-mode): 2.5 cm LEFT ATRIUM             Index       RIGHT ATRIUM           Index LA diam:        3.90 cm 1.97 cm/m  RA Area:     11.90 cm LA Vol (A2C):   52.4 ml 26.48 ml/m RA Volume:   24.90 ml  12.58 ml/m LA Vol (A4C):   34.0 ml 17.18 ml/m LA Biplane Vol: 42.4 ml 21.43 ml/m   AORTA Ao Root diam: 2.80 cm MITRAL VALVE MV Area (PHT): 3.12 cm  SHUNTS MV PHT:        70.47 msec            Systemic Diam: 2.00 cm MV Decel Time: 243 msec MV E velocity: 101.00 cm/s 103 cm/s MV A velocity: 110.00 cm/s 70.3 cm/s MV E/A ratio:  0.92        1.5  Loralie Champagne MD Electronically signed by Loralie Champagne MD Signature Date/Time: 07/07/2019/6:06:53 PM    Final    VAS US CAROTID  Result Date: 07/08/2019 Carotid Arterial Duplex Study Indications:       CVA and carotid stenosis. Risk Factors:      Hypertension, hyperlipidemia, Diabetes. Comparison Study:  no prior Performing Technologist: Abram Sander RVS  Examination Guidelines: A complete evaluation includes B-mode imaging, spectral Doppler, color Doppler, and power Doppler as needed of all accessible portions of each vessel. Bilateral testing is considered an integral part of a complete examination. Limited examinations for reoccurring indications may be performed as noted.  Right Carotid Findings: +----------+--------+--------+--------+------------------+--------+           PSV cm/sEDV cm/sStenosisPlaque DescriptionComments +----------+--------+--------+--------+------------------+--------+  CCA Prox  91      28              heterogenous               +----------+--------+--------+--------+------------------+--------+ CCA Distal78      24              heterogenous               +----------+--------+--------+--------+------------------+--------+ ICA Prox  72      30      1-39%   heterogenous               +----------+--------+--------+--------+------------------+--------+ ICA Distal236     76                                         +----------+--------+--------+--------+------------------+--------+ ECA       77      11                                         +----------+--------+--------+--------+------------------+--------+ +----------+--------+-------+--------+-------------------+           PSV cm/sEDV cmsDescribeArm Pressure (mmHG) +----------+--------+-------+--------+-------------------+ [id number]                                         +----------+--------+-------+--------+-------------------+ +---------+--------+--+--------+--+ VertebralPSV cm/s75EDV cm/s17 +---------+--------+--+--------+--+  Left Carotid Findings: +----------+--------+--------+--------+------------------+--------+           PSV cm/sEDV cm/sStenosisPlaque DescriptionComments +----------+--------+--------+--------+------------------+--------+ CCA Prox  148     41              heterogenous               +----------+--------+--------+--------+------------------+--------+ CCA Distal81      31              heterogenous               +----------+--------+--------+--------+------------------+--------+ ICA Prox  118     50      40-59%  heterogenous               +----------+--------+--------+--------+------------------+--------+ ICA Mid   166  60                                         +----------+--------+--------+--------+------------------+--------+ ICA Distal181     63                                          +----------+--------+--------+--------+------------------+--------+ ECA       54      15                                         +----------+--------+--------+--------+------------------+--------+ +----------+--------+--------+--------+-------------------+           PSV cm/sEDV cm/sDescribeArm Pressure (mmHG) +----------+--------+--------+--------+-------------------+ [id number]                                         +----------+--------+--------+--------+-------------------+ +---------+--------+--+--------+--+---------+ VertebralPSV cm/s67EDV cm/s24Antegrade +---------+--------+--+--------+--+---------+   Summary: Right Carotid: Velocities in the right ICA are consistent with a 1-39% stenosis. Left Carotid: Velocities in the left ICA are consistent with a 40-59% stenosis. Vertebrals: Bilateral vertebral arteries demonstrate antegrade flow. *See table(s) above for measurements and observations.     Preliminary    VAS [place] TRANSCRANIAL DOPPLER  Result Date: 07/08/2019  Transcranial Doppler Indications: Stroke. Comparison Study: no prior Performing Technologist: Abram Sander RVS  Examination Guidelines: A complete evaluation includes B-mode imaging, spectral Doppler, color Doppler, and power Doppler as needed of all accessible portions of each vessel. Bilateral testing is considered an integral part of a complete examination. Limited examinations for reoccurring indications may be performed as noted.  +----------+-------------+----------+-----------+-------+ RIGHT TCD Right VM (cm)Depth (cm)PulsatilityComment +----------+-------------+----------+-----------+-------+ ACA           28.00                 1.20            +----------+-------------+----------+-----------+-------+ Term ICA      38.00                 1.27            +----------+-------------+----------+-----------+-------+ PCA           51.00                 0.94             +----------+-------------+----------+-----------+-------+ Opthalmic     20.00                 1.10            +----------+-------------+----------+-----------+-------+ ICA siphon    47.00                 0.65            +----------+-------------+----------+-----------+-------+ Vertebral    -27.00                 0.89            +----------+-------------+----------+-----------+-------+  +----------+------------+----------+-----------+-------+ LEFT TCD  Left VM (cm)Depth (cm)PulsatilityComment +----------+------------+----------+-----------+-------+ MCA          88.00                 0.72            +----------+------------+----------+-----------+-------+  Term ICA     39.00                 0.82            +----------+------------+----------+-----------+-------+ PCA          25.00                 0.93            +----------+------------+----------+-----------+-------+ Opthalmic    20.00                 1.33            +----------+------------+----------+-----------+-------+ ICA siphon   65.00                 0.65            +----------+------------+----------+-----------+-------+  +------------+-------+-------+             VM cm/sComment +------------+-------+-------+ Prox Basilar 69.00         +------------+-------+-------+    Preliminary         Scheduled Meds: . amLODipine  10 mg Oral Daily  . aspirin EC  81 mg Oral Daily  . atorvastatin  20 mg Oral Daily  . clopidogrel  75 mg Oral Daily  . cyclobenzaprine  5 mg Oral QHS  . DULoxetine  20 mg Oral Daily  . heparin  5,000 Units Subcutaneous Q8H  . insulin aspart  0-9 Units Subcutaneous TID WC  . insulin glargine  20 Units Subcutaneous QHS  . mometasone-formoterol  2 puff Inhalation BID  . pantoprazole  40 mg Oral Daily  . pregabalin  200 mg Oral TID  . topiramate  100 mg Oral Daily   Continuous Infusions: . sodium chloride 100 mL/hr at 07/08/19 1703     LOS: 1 day    Time spent:  64mins   Kathie Dike, MD [facility]   If 7PM-7AM, please contact night-coverage [email or web address]  07/08/2019, 8:39 PM

## 2019-07-08 NOTE — Evaluation (Addendum)
Physical Therapy Evaluation Patient Details Name: [name] MRN: [id number] DOB: 1950/11/14 Today's Date: 07/08/2019   History of Present Illness  Pt is a 69 y.o. female admitted 07/06/19 with weakness and "passing out whenever she tries to walk" per grandson. Pt found to have hypotension, AKI with dehydration. 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 PMH includes DM2, HTN.    Clinical Impression  Pt presents with an overall decrease in functional mobility secondary to above. PTA, pt mod indep with intermittent use of SPC/RW, lives alone with multiple family members who check on her daily. Today, pt ambulating with and without DME; stability improved with use of RW; pt denies dizziness with mobility. Educ re: fall risk reduction, activity recommendations, importance of mobility. Per OT evaluation, pt with negative orthostatic hypotension. Pt would benefit from continued acute PT services to maximize functional mobility and independence prior to d/c home.     Follow Up Recommendations No PT follow up;Supervision - Intermittent    Equipment Recommendations  None recommended by PT    Recommendations for Other Services       Precautions / Restrictions Precautions Precautions: Fall Restrictions Weight Bearing Restrictions: No      Mobility  Bed Mobility Overal bed mobility: Modified Independent             General bed mobility comments: HOB elevated, increased time  Transfers Overall transfer level: Needs assistance Equipment used: None;Rolling walker (2 wheeled) Transfers: Sit to/from Stand Sit to Stand: Supervision         General transfer comment: moderate cues for safety with body mechanics  Ambulation/Gait Ambulation/Gait assistance: Min guard Gait Distance (Feet): 300 Feet Assistive device: None;Rolling walker (2 wheeled) Gait Pattern/deviations: Step-through pattern;Decreased stride length Gait velocity: Decreased Gait velocity  interpretation: 1.31 - 2.62 ft/sec, indicative of limited community ambulator General Gait Details: Initial hallway ambulation without DME, pt with mild instability requiring close min guard and 1x minA to prevent LOB; additional hallway ambulation with RW, pt with increased gait speed and reports more stability, intermittent min guard and 1x minA to prevent lateral LOB when pt looking around at nurses' station; cues for safety and fall risk reduction. Pt asymptomatic  Stairs            Wheelchair Mobility    Modified Rankin (Stroke Patients Only) Modified Rankin (Stroke Patients Only) Pre-Morbid Rankin Score: No symptoms Modified Rankin: No symptoms     Balance Overall balance assessment: Needs assistance   Sitting balance-Leahy Scale: Good       Standing balance-Leahy Scale: Fair               High level balance activites: Backward walking;Direction changes;Head turns High Level Balance Comments: LOB with head turns requiring assist to prevent fall             Pertinent Vitals/Pain Pain Assessment: Faces Faces Pain Scale: Hurts a little bit Pain Location: Knees ("bad arthritis") Pain Descriptors / Indicators: Constant;Discomfort Pain Intervention(s): Monitored during session    Home Living Family/patient expects to be discharged to:: Private residence Living Arrangements: Alone Available Help at Discharge: Family;Available 24 hours/day Type of Home: House Home Access: Level entry     Home Layout: One level Home Equipment: Grab bars - tub/shower;Walker - 2 wheels;Walker - 4 wheels;Cane - single point Additional Comments: pt reports her grandson is "in and out" of the house, has additional family in the area that check on her    Prior Function Level of Independence: Independent  with assistive device(s)         Comments: Intermittent use of SPC or RW. Grandson assits with IADLs, grocery shopping, and transportation; patient does not drive. 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) RUE Deficits / Details: patient has hisstory of R UE fracture resulting in R elbow deformity  RUE Coordination: WNL LUE Coordination: WNL    Lower Extremity Assessment Lower Extremity Assessment: Generalized weakness       Communication   Communication: No difficulties  Cognition Arousal/Alertness: Awake/alert Behavior During Therapy: WFL for tasks assessed/performed Overall Cognitive Status: Within Functional Limits for tasks assessed                                 General Comments: WFL for simple tasks; not formally assessed      General Comments General comments (skin integrity, edema, etc.): OT had just completed orthostatic hypotension BP measurements which were negative    Exercises     Assessment/Plan    PT Assessment Patient needs continued PT services  PT Problem List Decreased strength;Decreased activity tolerance;Decreased balance;Decreased mobility       PT Treatment Interventions DME instruction;Gait training;Stair training;Functional mobility training;Therapeutic activities;Therapeutic exercise;Balance training;Patient/family education    PT Goals (Current goals can be found in the Care Plan section)  Acute Rehab PT Goals Patient Stated Goal: to go home PT Goal Formulation: With patient Time For Goal Achievement: 07/22/19 Potential to Achieve Goals: Good    Frequency Min 3X/week   Barriers to discharge        Co-evaluation               AM-PAC PT "6 Clicks" Mobility  Outcome Measure Help needed turning from your back to your side while in a flat bed without using bedrails?: None Help needed moving from lying on your back to  sitting on the side of a flat bed without using bedrails?: None Help needed moving to and from a bed to a chair (including a wheelchair)?: None Help needed standing up from a chair using your arms (e.g., wheelchair or bedside chair)?: None Help needed to walk in hospital room?: A Little Help needed climbing 3-5 steps with a railing? : A Little 6 Click Score: 22    End of Session Equipment Utilized During Treatment: Gait belt Activity Tolerance: Patient tolerated treatment well Patient left: in bed;with call bell/phone within reach;with bed alarm set Nurse Communication: Mobility status PT Visit Diagnosis: Other abnormalities of gait and mobility (R26.89)    Time: 6433-2951 PT Time Calculation (min) (ACUTE ONLY): 15 min   Charges:   PT Evaluation $PT Eval Moderate Complexity: [place], PT, DPT Acute Rehabilitation Services  Pager [phone] Office [place] 07/08/2019, 1:01 PM

## 2019-07-08 NOTE — Progress Notes (Signed)
STROKE TEAM PROGRESS NOTE   INTERVAL HISTORY I have personally reviewed history of presenting illness with the patient, electronic medical records and imaging films in PACS.  Patient was admitted with multiple episodes of passing out as well as confusion and MRI scan shows small left occipital pole infarct.  She denies any significant vision difficulties or prior history of palpitations or atrial fibrillation.  Vitals:   07/07/19 2014 07/07/19 2210 07/08/19 0604 07/08/19 0920  BP:  (!) 115/57 (!) 141/65 133/61  Pulse:  86 77 82  Resp:  15 15 18   Temp:  97.6 F (36.4 C) 97.9 F (36.6 C) 98.4 F (36.9 C)  TempSrc:  Oral Oral Oral  SpO2: 95% 98% 98% 99%  Weight:        CBC:  Recent Labs  Lab 07/06/19 2247 07/06/19 2303 07/07/19 0535 07/08/19 0658  WBC 7.6   < > 8.2 6.5  NEUTROABS 3.5  --   --  2.4  HGB 9.1*   < > 8.7* 9.7*  HCT 31.9*   < > 31.2* 32.8*  MCV 79.6*   < > 78.2* 74.7*  PLT 162   < > 149* 154   < > = values in this interval not displayed.    Basic Metabolic Panel:  Recent Labs  Lab 07/07/19 0750 07/08/19 0658  NA 137 138  K 4.3 3.8  CL 105 109  CO2 18* 18*  GLUCOSE 206* 253*  BUN 50* 34*  CREATININE 4.17* 2.62*  CALCIUM 8.2* 8.1*  MG  --  1.4*  PHOS  --  3.0   Lipid Panel:     Component Value Date/Time   CHOL 91 07/08/2019 0658   TRIG 196 (H) 07/08/2019 0658   HDL 32 (L) 07/08/2019 0658   CHOLHDL 2.8 07/08/2019 0658   VLDL 39 07/08/2019 0658   LDLCALC 20 07/08/2019 0658   HgbA1c:  Lab Results  Component Value Date   HGBA1C 12.3 (H) 07/08/2019   Urine Drug Screen: No results found for: LABOPIA, COCAINSCRNUR, LABBENZ, AMPHETMU, THCU, LABBARB  Alcohol Level No results found for: ETH  IMAGING past 48 hours CT ABDOMEN PELVIS WO CONTRAST  Result Date: 07/07/2019 CLINICAL DATA:  Abdominal pain and distension. EXAM: CT ABDOMEN AND PELVIS WITHOUT CONTRAST TECHNIQUE: Multidetector CT imaging of the abdomen and pelvis was performed following the  standard protocol without IV contrast. COMPARISON:  CT 12/24/2010 FINDINGS: Lower chest: Linear atelectasis in the right lower lobe and lingula. Hepatobiliary: Prominent size liver spanning 20 cm cranial caudal. Diffusely decreased hepatic density consistent with steatosis. There are nodular hepatic contours. No evidence of focal lesion. Clips in the gallbladder fossa postcholecystectomy. No biliary dilatation. Pancreas: Fatty atrophy.  No ductal dilatation or inflammation. Spleen: Normal in size without focal abnormality. Adrenals/Urinary Tract: Normal adrenal glands. Slight left renal atrophy. No hydronephrosis. No perinephric edema. No urolithiasis. Urinary bladder is physiologically distended. No bladder wall thickening. Stomach/Bowel: No bowel obstruction, administered enteric contrast reaches the colon. Stomach is mildly distended with enteric contrast. Question of distal paraesophageal varices, series 3, image 15. No definite small bowel wall thickening. Diminutive appendix tentatively visualized, no evidence of appendicitis. Contrast with small volume of stool in the colon. No colonic wall thickening or inflammation. Vascular/Lymphatic: Mild aortic atherosclerosis. No aortic aneurysm. Small periportal nodes are likely reactive. Reproductive: Hysterectomy. Ovaries tentatively visualized and quiescent. No adnexal mass. Other: Small fat containing umbilical hernia. No ascites. No free air. Musculoskeletal: Posterior lumbosacral fusion. Degenerative change of both hips. There are no  acute or suspicious osseous abnormalities. IMPRESSION: 1. No acute abnormality in the abdomen/pelvis. 2. Hepatomegaly and hepatic steatosis. Nodular hepatic contours raises concern for cirrhosis. There are questionable paraesophageal varices, not well assessed in the absence of IV contrast. Aortic Atherosclerosis (ICD10-I70.0). Electronically Signed   By: Keith Rake M.D.   On: 07/07/2019 03:35   CT HEAD WO CONTRAST  Result  Date: 07/07/2019 CLINICAL DATA:  Ataxia. EXAM: CT HEAD WITHOUT CONTRAST TECHNIQUE: Contiguous axial images were obtained from the base of the skull through the vertex without intravenous contrast. COMPARISON:  Head CT 05/31/2019 FINDINGS: Brain: Normal brain volume for age. No intracranial hemorrhage, mass effect, or midline shift. No hydrocephalus. The basilar cisterns are patent. No evidence of territorial infarct or acute ischemia. No extra-axial or intracranial fluid collection. Vascular: No hyperdense vessel or unexpected calcification. Skull: No fracture or focal lesion. Sinuses/Orbits: Chronic left sphenoid sinus sinusitis. Acute findings. Other: None. IMPRESSION: 1. No acute intracranial abnormality. 2. Chronic left sphenoid sinusitis. Electronically Signed   By: Keith Rake M.D.   On: 07/07/2019 05:18   MR BRAIN WO CONTRAST  Result Date: 07/07/2019 CLINICAL DATA:  Encephalopathy.  Hypertension diabetes. EXAM: MRI HEAD WITHOUT CONTRAST TECHNIQUE: Multiplanar, multiecho pulse sequences of the brain and surrounding structures were obtained without intravenous contrast. COMPARISON:  CT head 07/07/2019 FINDINGS: Brain: Probable small area of acute infarct left occipital pole measuring 5 mm. No other acute infarct. Ventricle size normal. Patchy hyperintensity in the pons bilaterally. No prior MRI for comparison. Probable chronic microvascular ischemic change. Negative for hemorrhage or mass. Remainder of the cerebral white matter normal. Vascular: Normal arterial flow voids. Skull and upper cervical spine: No focal skeletal lesion. Sinuses/Orbits: Mucosal edema left sphenoid sinus.  Normal orbit Other: None IMPRESSION: Probable small area of acute infarct left occipital pole Patchy hyperintensity in the pons most likely chronic microvascular ischemia. Electronically Signed   By: Franchot Gallo M.D.   On: 07/07/2019 09:08   DG Chest Port 1 View  Result Date: 07/06/2019 CLINICAL DATA:  Syncope with  weakness and fatigue EXAM: PORTABLE CHEST 1 VIEW COMPARISON:  03/20/2019 FINDINGS: The heart size and mediastinal contours are within normal limits. Both lungs are clear. The visualized skeletal structures are unremarkable. IMPRESSION: No active disease. Electronically Signed   By: Ulyses Jarred M.D.   On: 07/06/2019 23:18   ECHOCARDIOGRAM COMPLETE  Result Date: 07/07/2019   ECHOCARDIOGRAM REPORT   Patient Name:   SAILOR HEVIA [facility] Date of Exam: 07/07/2019 Medical Rec #:  [id number]               Height:       64.0 in Accession #:    [id number]              Weight:       205.5 lb Date of Birth:  Oct 24, 1950               BSA:          1.98 m Patient Age:    19 years                BP:           117/54 mmHg Patient Gender: F                       HR:           47 bpm. Exam Location:  Inpatient Procedure: 2D Echo Indications:    syncope 780.2  History:        Patient has prior history of Echocardiogram examinations, most                 recent 06/22/2018. Risk Factors:Diabetes and Hypertension.  Sonographer:    Johny Chess Referring Phys: [id number] OMAIR LATIF [place]  1. Left ventricular ejection fraction, by visual estimation, is 65 to 70%. The left ventricle has hyperdynamic function. There is no left ventricular hypertrophy.  2. Left ventricular diastolic parameters are consistent with Grade I diastolic dysfunction (impaired relaxation).  3. The left ventricle has no regional wall motion abnormalities.  4. Global right ventricle has normal systolic function.The right ventricular size is normal. No increase in right ventricular wall thickness.  5. Left atrial size was normal.  6. Right atrial size was normal.  7. The mitral valve is normal in structure. No evidence of mitral valve regurgitation. No evidence of mitral stenosis.  8. The tricuspid valve is normal in structure. Tricuspid valve regurgitation is not demonstrated.  9. The aortic valve is tricuspid. Mild aortic valve stenosis. Mean  gradient 11 mmHg. 10. The inferior vena cava is normal in size with greater than 50% respiratory variability, suggesting right atrial pressure of 3 mmHg. 11. TR signal is inadequate for assessing pulmonary artery systolic pressure. FINDINGS  Left Ventricle: Left ventricular ejection fraction, by visual estimation, is 65 to 70%. The left ventricle has hyperdynamic function. The left ventricle has no regional wall motion abnormalities. There is no left ventricular hypertrophy. Left ventricular diastolic parameters are consistent with Grade I diastolic dysfunction (impaired relaxation). Right Ventricle: The right ventricular size is normal. No increase in right ventricular wall thickness. Global RV systolic function is has normal systolic function. Left Atrium: Left atrial size was normal in size. Right Atrium: Right atrial size was normal in size Pericardium: There is no evidence of pericardial effusion. Mitral Valve: The mitral valve is normal in structure. No evidence of mitral valve regurgitation. No evidence of mitral valve stenosis by observation. Tricuspid Valve: The tricuspid valve is normal in structure. Tricuspid valve regurgitation is not demonstrated. Aortic Valve: The aortic valve is tricuspid. Aortic valve regurgitation is not visualized. Mild aortic stenosis is present. Pulmonic Valve: The pulmonic valve was normal in structure. Pulmonic valve regurgitation is not visualized. Pulmonic regurgitation is not visualized. Aorta: The aortic root is normal in size and structure. Venous: The inferior vena cava is normal in size with greater than 50% respiratory variability, suggesting right atrial pressure of 3 mmHg. IAS/Shunts: No atrial level shunt detected by color flow Doppler.  LEFT VENTRICLE PLAX 2D LVIDd:         4.90 cm  Diastology LVIDs:         2.60 cm  LV e' lateral:   12.00 cm/s LV PW:         1.00 cm  LV E/e' lateral: 8.4 LV IVS:        0.80 cm  LV e' medial:    8.38 cm/s LVOT diam:     2.00 cm  LV  E/e' medial:  12.1 LV SV:         88 ml LV SV Index:   42.16 LVOT Area:     3.14 cm  RIGHT VENTRICLE RV S prime:     21.10 cm/s TAPSE (M-mode): 2.5 cm LEFT ATRIUM             Index       RIGHT ATRIUM  Index LA diam:        3.90 cm 1.97 cm/m  RA Area:     11.90 cm LA Vol (A2C):   52.4 ml 26.48 ml/m RA Volume:   24.90 ml  12.58 ml/m LA Vol (A4C):   34.0 ml 17.18 ml/m LA Biplane Vol: 42.4 ml 21.43 ml/m   AORTA Ao Root diam: 2.80 cm MITRAL VALVE MV Area (PHT): 3.12 cm              SHUNTS MV PHT:        70.47 msec            Systemic Diam: 2.00 cm MV Decel Time: 243 msec MV E velocity: 101.00 cm/s 103 cm/s MV A velocity: 110.00 cm/s 70.3 cm/s MV E/A ratio:  0.92        1.5  Loralie Champagne MD Electronically signed by Loralie Champagne MD Signature Date/Time: 07/07/2019/6:06:53 PM    Final     PHYSICAL EXAM Pleasant mildly obese middle-aged African-American lady sitting up comfortably in bed.  Not in distress. . Afebrile. Head is nontraumatic. Neck is supple without bruit.    Cardiac exam no murmur or gallop. Lungs are clear to auscultation. Distal pulses are well felt. Neurological Exam ;  Awake  Alert oriented x 3. Normal speech and language.eye movements full without nystagmus.fundi were not visualized. Vision acuity and fields appear normal. Hearing is normal. Palatal movements are normal. Face symmetric. Tongue midline. Normal strength, tone, reflexes and coordination. Normal sensation. Gait deferred. ASSESSMENT/PLAN [name] is a 69 y.o. female with history of sleep apnea, neuropathy, hypertension, hyperlipidemia, headaches and diabetes presenting with weakness s/p syncopal episodes and hypotension. MRI showed stroke.   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 Chronic L sphenoid sinusitis.   MRI  patchy L occipital infarct. Hyperintensity inpons most likely chronic microvascular ischemia  2D Echo EF  60-70%. No source of embolus   Carotid Doppler  L 40-59% stenosis , R 1-39% stenosis transcranial doppler mildly elevated left middle cerebral artery mean flow velocities suggestive of mild stenosis.  Globally elevated pulsatility indices suggest diffuse intracranial atherosclerosis.  Loop / monitoring as OP to loop for atrial fibrillation as source of stroke  LDL 20  HgbA1c 12.3  Heparin 5000 units sq tid for VTE prophylaxis  No antithrombotic prior to admission, now on No antithrombotic. Given mild stroke, recommend aspirin 81 mg and plavix 75 mg daily x 3 weeks, then aspirin alone. Orders adjusted.   Therapy recommendations:  No therapy needs  Disposition:  Return home  Hypertension  Stable . Permissive hypertension (OK if < 220/120) but gradually normalize in 5-7 days . Long-term BP goal normotensive  Hyperlipidemia  Home meds:  lipitor 20, resumed in hospital  LDL 20, goal < 70  Will not use intensive statin given very low LDL on low dose statin  Continue statin at discharge  Diabetes type II Uncontrolled  HgbA1c 12.3, goal < 7.0  CBGs Recent Labs    07/07/19 1614 07/07/19 2211 07/08/19 0602  GLUCAP 234* 208* 237*      SSI  Other Stroke Risk Factors  Advanced age  ETOH use,  advised to drink no more than 1 drink(s) a day  Obesity, Body mass index is 35.27 kg/m., recommend weight loss, diet and exercise as appropriate   Obstructive sleep apnea, on CPAP at home  Other Active Problems  ARF  Syncopal episode from  hypotension  Anemia, chronic  On topamax    Hospital day # 1  She presented with multiple episodes of passing out and MRI scan showing embolic left occipital infarct in setting strong suspicion for paroxysmal A. fib.  Continue telemetry monitoring while hospitalized and recommend outpatient prolonged cardiac monitoring with 30-day heart monitor versus loop recorder as outpatient.  Aspirin and Plavix for 3 weeks followed by aspirin  alone.  Continue CPAP at home for obstructive sleep apnea.  Aggressive risk factor modification.  Greater than 50% time during this 35-minute visit was spent in counseling and coordination of care about her embolic stroke and answering questions.  Discussed with Dr. Roselyn Reef, MD To contact Stroke Continuity provider, please refer to [email or web address] After hours, contact General Neurology

## 2019-07-08 NOTE — Progress Notes (Addendum)
Inpatient Diabetes Program Recommendations  AACE/ADA: New Consensus Statement on Inpatient Glycemic Control (2015)  Target Ranges:  Prepandial:   less than 140 mg/dL      Peak postprandial:   less than 180 mg/dL (1-2 hours)      Critically ill patients:  140 - 180 mg/dL   Lab Results  Component Value Date   GLUCAP 284 (H) 07/08/2019   HGBA1C 12.3 (H) 07/08/2019    Review of Glycemic Control  Diabetes history: DM 2 Outpatient Diabetes medications: Amaryl 4 mg Daily, Januvia 100 mg Daily, Lantus 30 units, Metformin 9244 mg bid, Trulicity 0.5 mg Q Monday Current orders for Inpatient glycemic control:  Lantus 20 units qhs Novolog 0-9 units tid  A1c 12.3% on 1/22  Inpatient Diabetes Program Recommendations:    Consider increasing Lantus to 25 units.  Add Novolog bedtime scale  Pt reports needing a glucometer at time of d/c.  Addendum 1:16 pm:  Spoke with patient at bedside regarding A1c of 12.3 on 1/22. Discussed glucose and A1c goals. Pt reports just seeing her PCP 2 weeks ago and increased her Lantus from 30 to 60 units. Pt reports seeing her PCP every 6 months. Pt reports compliance to her medications. Stressed importance of glucose control.  Thanks,  Tama Headings RN, MSN, BC-ADM Inpatient Diabetes Coordinator Team Pager [phone] (8a-5p)

## 2019-07-09 LAB — BASIC METABOLIC PANEL
Anion gap: 11 (ref 5–15)
BUN: 20 mg/dL (ref 8–23)
CO2: 20 mmol/L — ABNORMAL LOW (ref 22–32)
Calcium: 7.9 mg/dL — ABNORMAL LOW (ref 8.9–10.3)
Chloride: 108 mmol/L (ref 98–111)
Creatinine, Ser: 1.74 mg/dL — ABNORMAL HIGH (ref 0.44–1.00)
GFR calc Af Amer: 35 mL/min — ABNORMAL LOW (ref >60)
GFR calc non Af Amer: 30 mL/min — ABNORMAL LOW (ref >60)
Glucose, Bld: 198 mg/dL — ABNORMAL HIGH (ref 70–99)
Potassium: 3.7 mmol/L (ref 3.5–5.1)
Sodium: 139 mmol/L (ref 135–145)

## 2019-07-09 LAB — GLUCOSE, CAPILLARY
Glucose-Capillary: 194 mg/dL — ABNORMAL HIGH (ref 70–99)
Glucose-Capillary: 251 mg/dL — ABNORMAL HIGH (ref 70–99)
Glucose-Capillary: 223 mg/dL — ABNORMAL HIGH (ref 70–99)
Glucose-Capillary: 250 mg/dL — ABNORMAL HIGH (ref 70–99)

## 2019-07-09 LAB — URIC ACID: Uric Acid, Serum: 8.1 mg/dL — ABNORMAL HIGH (ref 2.5–7.1)

## 2019-07-09 LAB — FERRITIN: Ferritin: 24 ng/mL (ref 11–307)

## 2019-07-09 LAB — IRON AND TIBC
Iron: 30 ug/dL (ref 28–170)
Saturation Ratios: 8 % — ABNORMAL LOW (ref 10.4–31.8)
TIBC: 392 ug/dL (ref 250–450)
UIBC: 362 ug/dL

## 2019-07-09 NOTE — Consult Note (Signed)
Referring Physician: Sonia Side, FNP  [name] is an 69 y.o. female.                       Chief Complaint: Bilateral lower extremities weakness with acute small left occipital infarct  HPI: 70 years old black female presented with bilateral lower extremities weakness resulting in passing out type feeling. She feels her overall condition has improved over 3 days and she is able to ambulate without support. MRI brain showed small acute left occipital infarct. Her echocardiogram shows normal LV systolic function. Her carotid doppler shows non-obstructive mild lesions.  Her renal function is improving post hydration. Lipid panel shows HDL of 32 mg, LDL of 20 mg and triglycerides of 196 mg. with total cholesterol of 91 mg. Hgb is 9.7 g. with low MCV of 74.7 %.  She has PMH of hypertension, type 2 DM with hyperglycemia and peripheral neuropathy, OSA, Hyperlipidemia, headaches, Arthritis and GERD  Past Medical History:  Diagnosis Date  . Arthritis   . Diabetes mellitus without complication ([facility])   . GERD (gastroesophageal reflux disease)   . H/O hiatal hernia   . Headache(784.0)   . Hyperlipidemia   . Hypertension   . Neuromuscular disorder ([facility])    neuropathy feet   . Sleep apnea    recently diagnosed- wears cpap      Past Surgical History:  Procedure Laterality Date  . ABDOMINAL HYSTERECTOMY     partial  . BACK SURGERY    . CHOLECYSTECTOMY    . COLONOSCOPY     age 76- doesnt know where or who   . left shoulder     x 2 surgeries  . right shoulder     x2  . SHOULDER OPEN ROTATOR CUFF REPAIR Right 08/25/2012   Procedure: ROTATOR CUFF REPAIR SHOULDER OPEN WITH ACROMIOPLASTY;  Surgeon: Tobi Bastos, MD;  Location: [facility];  Service: Orthopedics;  Laterality: Right;  . TONSILLECTOMY      Family History  Problem Relation Age of Onset  . Colon cancer Father   . Colon cancer Paternal Grandfather   . Colon cancer Maternal Grandfather   . Bone cancer Maternal  Uncle   . Colon polyps Neg Hx   . Esophageal cancer Neg Hx   . Rectal cancer Neg Hx   . Stomach cancer Neg Hx    Social History:  reports that she has never smoked. She has never used smokeless tobacco. She reports current alcohol use. She reports that she does not use drugs.  Allergies: No Known Allergies  Medications Prior to Admission  Medication Sig Dispense Refill  . amLODipine (NORVASC) 10 MG tablet Take 10 mg by mouth daily.    Marland Kitchen atorvastatin (LIPITOR) 20 MG tablet Take 20 mg by mouth daily.    . cetirizine (ZYRTEC) 10 MG tablet Take 10 mg by mouth daily.     . cyclobenzaprine (FLEXERIL) 5 MG tablet Take 5 mg by mouth at bedtime.     . diclofenac sodium (VOLTAREN) 1 % GEL Apply 2 g topically daily.     . DULoxetine (CYMBALTA) 20 MG capsule Take 20 mg by mouth daily.     . furosemide (LASIX) 20 MG tablet Take 20 mg by mouth daily.   2  . glimepiride (AMARYL) 4 MG tablet Take 4 mg by mouth daily with breakfast.     . ipratropium-albuterol (DUONEB) 0.5-2.5 (3) MG/3ML SOLN Take 3 mLs by nebulization 2 (two) times daily. (  Patient taking differently: Take 3 mLs by nebulization every 6 (six) hours as needed (sob and wheezing). ) 360 mL 1  . JANUVIA 100 MG tablet Take 100 mg by mouth daily.    Marland Kitchen LANTUS SOLOSTAR 100 UNIT/ML Solostar Pen Inject 30 Units into the skin at bedtime.     Marland Kitchen lisinopril (ZESTRIL) 40 MG tablet Take 40 mg by mouth daily.     Marland Kitchen losartan (COZAAR) 25 MG tablet Take 1 tablet (25 mg total) by mouth daily. 30 tablet 1  . LYRICA 200 MG capsule Take 200 mg by mouth 3 (three) times daily.  1  . metFORMIN (GLUCOPHAGE) 1000 MG tablet Take 1,000 mg by mouth 2 (two) times daily.  0  . omeprazole (PRILOSEC) 20 MG capsule Take 20 mg by mouth daily.     . ondansetron (ZOFRAN) 4 MG tablet Take 1 tablet (4 mg total) by mouth every 6 (six) hours. (Patient taking differently: Take 4 mg by mouth every 8 (eight) hours as needed for nausea or vomiting. ) 12 tablet 0  . SYMBICORT 80-4.5  MCG/ACT inhaler Inhale 2 puffs into the lungs 2 (two) times daily.    Marland Kitchen topiramate (TOPAMAX) 100 MG tablet Take 100 mg by mouth daily.    . TRULICITY 5.73 UK/0.2RK SOPN Take 0.75 mg by mouth every Monday.    . VENTOLIN HFA 108 (90 Base) MCG/ACT inhaler Inhale 2 puffs into the lungs every 4 (four) hours as needed for wheezing or shortness of breath.   5  . ACCU-CHEK AVIVA PLUS test strip USE BID FOR GLUCOSE TESTING  11    Results for orders placed or performed during the hospital encounter of 07/06/19 (from the past 48 hour(s))  Glucose, capillary     Status: Abnormal   Collection Time: 07/07/19  4:14 PM  Result Value Ref Range   Glucose-Capillary 234 (H) 70 - 99 mg/dL   Comment 1 Notify RN    Comment 2 Document in Chart   Glucose, capillary     Status: Abnormal   Collection Time: 07/07/19 10:11 PM  Result Value Ref Range   Glucose-Capillary 208 (H) 70 - 99 mg/dL  Glucose, capillary     Status: Abnormal   Collection Time: 07/08/19  6:02 AM  Result Value Ref Range   Glucose-Capillary 237 (H) 70 - 99 mg/dL  Hemoglobin A1c     Status: Abnormal   Collection Time: 07/08/19  6:58 AM  Result Value Ref Range   Hgb A1c MFr Bld 12.3 (H) 4.8 - 5.6 %    Comment: (NOTE) Pre diabetes:          5.7%-6.4% Diabetes:              >6.4% Glycemic control for   <7.0% adults with diabetes    Mean Plasma Glucose 306.31 mg/dL    Comment: Performed at [facility], [place] [address], [place], [place] [address]  Magnesium     Status: Abnormal   Collection Time: 07/08/19  6:58 AM  Result Value Ref Range   Magnesium 1.4 (L) 1.7 - 2.4 mg/dL    Comment: Performed at [facility] [address], [place], [place] [address]  Phosphorus     Status: None   Collection Time: 07/08/19  6:58 AM  Result Value Ref Range   Phosphorus 3.0 2.5 - 4.6 mg/dL    Comment: Performed at [facility] [address] [place],  [address]  Comprehensive metabolic panel  Status: Abnormal   Collection  Time: 07/08/19  6:58 AM  Result Value Ref Range   Sodium 138 135 - 145 mmol/L   Potassium 3.8 3.5 - 5.1 mmol/L   Chloride 109 98 - 111 mmol/L   CO2 18 (L) 22 - 32 mmol/L   Glucose, Bld 253 (H) 70 - 99 mg/dL   BUN 34 (H) 8 - 23 mg/dL    Comment: RESULT REPEATED AND VERIFIED   Creatinine, Ser 2.62 (H) 0.44 - 1.00 mg/dL    Comment: RESULT REPEATED AND VERIFIED   Calcium 8.1 (L) 8.9 - 10.3 mg/dL   Total Protein 7.1 6.5 - 8.1 g/dL   Albumin 3.3 (L) 3.5 - 5.0 g/dL   AST 29 15 - 41 U/L   ALT 27 0 - 44 U/L   Alkaline Phosphatase 99 38 - 126 U/L   Total Bilirubin 0.8 0.3 - 1.2 mg/dL   GFR calc non Af Amer 18 (L) >60 mL/min   GFR calc Af Amer 21 (L) >60 mL/min   Anion gap 11 5 - 15    Comment: Performed at [facility], [place] [address], [place], [place] [address]  CBC with Differential/Platelet     Status: Abnormal   Collection Time: 07/08/19  6:58 AM  Result Value Ref Range   WBC 6.5 4.0 - 10.5 K/uL   RBC 4.39 3.87 - 5.11 MIL/uL   Hemoglobin 9.7 (L) 12.0 - 15.0 g/dL   HCT 32.8 (L) 36.0 - 46.0 %   MCV 74.7 (L) 80.0 - 100.0 fL   MCH 22.1 (L) 26.0 - 34.0 pg   MCHC 29.6 (L) 30.0 - 36.0 g/dL   RDW 18.9 (H) 11.5 - 15.5 %   Platelets 154 150 - 400 K/uL    Comment: REPEATED TO VERIFY   nRBC 0.0 0.0 - 0.2 %   Neutrophils Relative % 38 %   Neutro Abs 2.4 1.7 - 7.7 K/uL   Lymphocytes Relative 35 %   Lymphs Abs 2.3 0.7 - 4.0 K/uL   Monocytes Relative 11 %   Monocytes Absolute 0.7 0.1 - 1.0 K/uL   Eosinophils Relative 15 %   Eosinophils Absolute 1.0 (H) 0.0 - 0.5 K/uL   Basophils Relative 1 %   Basophils Absolute 0.1 0.0 - 0.1 K/uL   Immature Granulocytes 0 %   Abs Immature Granulocytes 0.01 0.00 - 0.07 K/uL    Comment: Performed at [facility], [place] [address] [place], [place] [address]  Lipid panel     Status: Abnormal   Collection Time: 07/08/19  6:58 AM  Result Value Ref Range   Cholesterol 91 0 - 200 mg/dL   Triglycerides 196 (H) <150 mg/dL   HDL 32 (L) >40 mg/dL    Total CHOL/HDL Ratio 2.8 RATIO   VLDL 39 0 - 40 mg/dL   LDL Cholesterol 20 0 - 99 mg/dL    Comment:        Total Cholesterol/HDL:CHD Risk Coronary Heart Disease Risk Table                     Men   Women  1/2 Average Risk   3.4   3.3  Average Risk       5.0   4.4  2 X Average Risk   9.6   7.1  3 X Average Risk  23.4   11.0        Use the calculated Patient Ratio above and the CHD Risk  Table to determine the patient's CHD Risk.        ATP III CLASSIFICATION (LDL):  <100     mg/dL   Optimal  100-129  mg/dL   Near or Above                    Optimal  130-159  mg/dL   Borderline  160-189  mg/dL   High  >190     mg/dL   Very High Performed at [facility] [address], [place], [place] [address]   Culture, blood (Routine X 2) w Reflex to ID Panel     Status: None (Preliminary result)   Collection Time: 07/08/19  7:35 AM   Specimen: BLOOD LEFT HAND  Result Value Ref Range   Specimen Description BLOOD LEFT HAND    Special Requests      BOTTLES DRAWN AEROBIC AND ANAEROBIC Blood Culture results may not be optimal due to an inadequate volume of blood received in culture bottles   Culture      NO GROWTH < 24 HOURS Performed at [facility] [address] [place], [place] [address]    Report Status PENDING   Culture, blood (Routine X 2) w Reflex to ID Panel     Status: None (Preliminary result)   Collection Time: 07/08/19  7:47 AM   Specimen: BLOOD RIGHT HAND  Result Value Ref Range   Specimen Description BLOOD RIGHT HAND    Special Requests      BOTTLES DRAWN AEROBIC ONLY Blood Culture results may not be optimal due to an inadequate volume of blood received in culture bottles   Culture      NO GROWTH < 24 HOURS Performed at [facility] [address] [place],  [address]    Report Status PENDING   Glucose, capillary     Status: Abnormal   Collection Time: 07/08/19 10:39 AM  Result Value Ref Range   Glucose-Capillary 284 (H) 70 - 99 mg/dL    Comment 1 Notify RN    Comment 2 Document in Chart   Glucose, capillary     Status: Abnormal   Collection Time: 07/08/19  4:41 PM  Result Value Ref Range   Glucose-Capillary 270 (H) 70 - 99 mg/dL   Comment 1 Notify RN    Comment 2 Document in Chart   Glucose, capillary     Status: Abnormal   Collection Time: 07/08/19  9:32 PM  Result Value Ref Range   Glucose-Capillary 236 (H) 70 - 99 mg/dL  Basic metabolic panel     Status: Abnormal   Collection Time: 07/09/19  4:58 AM  Result Value Ref Range   Sodium 139 135 - 145 mmol/L   Potassium 3.7 3.5 - 5.1 mmol/L   Chloride 108 98 - 111 mmol/L   CO2 20 (L) 22 - 32 mmol/L   Glucose, Bld 198 (H) 70 - 99 mg/dL   BUN 20 8 - 23 mg/dL   Creatinine, Ser 1.74 (H) 0.44 - 1.00 mg/dL   Calcium 7.9 (L) 8.9 - 10.3 mg/dL   GFR calc non Af Amer 30 (L) >60 mL/min   GFR calc Af Amer 35 (L) >60 mL/min   Anion gap 11 5 - 15    Comment: Performed at [facility], [place] [address], [place], [place] [address]  Glucose, capillary     Status: Abnormal   Collection Time: 07/09/19  6:05 AM  Result Value Ref  Range   Glucose-Capillary 194 (H) 70 - 99 mg/dL  Glucose, capillary     Status: Abnormal   Collection Time: 07/09/19 11:04 AM  Result Value Ref Range   Glucose-Capillary 251 (H) 70 - 99 mg/dL   Comment 1 Notify RN    ECHOCARDIOGRAM COMPLETE  Result Date: 07/07/2019   ECHOCARDIOGRAM REPORT   Patient Name:   [name] [facility] Date of Exam: 07/07/2019 Medical Rec #:  [id number]               Height:       64.0 in Accession #:    [id number]              Weight:       205.5 lb Date of Birth:  1950/09/09               BSA:          1.98 m Patient Age:    74 years                BP:           117/54 mmHg Patient Gender: F                       HR:           47 bpm. Exam Location:  Inpatient Procedure: 2D Echo Indications:    syncope 780.2  History:        Patient has prior history of Echocardiogram examinations, most                 recent 06/22/2018. Risk  Factors:Diabetes and Hypertension.  Sonographer:    Johny Chess Referring Phys: [id number] OMAIR LATIF [place]  1. Left ventricular ejection fraction, by visual estimation, is 65 to 70%. The left ventricle has hyperdynamic function. There is no left ventricular hypertrophy.  2. Left ventricular diastolic parameters are consistent with Grade I diastolic dysfunction (impaired relaxation).  3. The left ventricle has no regional wall motion abnormalities.  4. Global right ventricle has normal systolic function.The right ventricular size is normal. No increase in right ventricular wall thickness.  5. Left atrial size was normal.  6. Right atrial size was normal.  7. The mitral valve is normal in structure. No evidence of mitral valve regurgitation. No evidence of mitral stenosis.  8. The tricuspid valve is normal in structure. Tricuspid valve regurgitation is not demonstrated.  9. The aortic valve is tricuspid. Mild aortic valve stenosis. Mean gradient 11 mmHg. 10. The inferior vena cava is normal in size with greater than 50% respiratory variability, suggesting right atrial pressure of 3 mmHg. 11. TR signal is inadequate for assessing pulmonary artery systolic pressure. FINDINGS  Left Ventricle: Left ventricular ejection fraction, by visual estimation, is 65 to 70%. The left ventricle has hyperdynamic function. The left ventricle has no regional wall motion abnormalities. There is no left ventricular hypertrophy. Left ventricular diastolic parameters are consistent with Grade I diastolic dysfunction (impaired relaxation). Right Ventricle: The right ventricular size is normal. No increase in right ventricular wall thickness. Global RV systolic function is has normal systolic function. Left Atrium: Left atrial size was normal in size. Right Atrium: Right atrial size was normal in size Pericardium: There is no evidence of pericardial effusion. Mitral Valve: The mitral valve is normal in structure. No evidence  of mitral valve regurgitation. No evidence of mitral valve stenosis by observation. Tricuspid Valve: The tricuspid valve is normal in structure.  Tricuspid valve regurgitation is not demonstrated. Aortic Valve: The aortic valve is tricuspid. Aortic valve regurgitation is not visualized. Mild aortic stenosis is present. Pulmonic Valve: The pulmonic valve was normal in structure. Pulmonic valve regurgitation is not visualized. Pulmonic regurgitation is not visualized. Aorta: The aortic root is normal in size and structure. Venous: The inferior vena cava is normal in size with greater than 50% respiratory variability, suggesting right atrial pressure of 3 mmHg. IAS/Shunts: No atrial level shunt detected by color flow Doppler.  LEFT VENTRICLE PLAX 2D LVIDd:         4.90 cm  Diastology LVIDs:         2.60 cm  LV e' lateral:   12.00 cm/s LV PW:         1.00 cm  LV E/e' lateral: 8.4 LV IVS:        0.80 cm  LV e' medial:    8.38 cm/s LVOT diam:     2.00 cm  LV E/e' medial:  12.1 LV SV:         88 ml LV SV Index:   42.16 LVOT Area:     3.14 cm  RIGHT VENTRICLE RV S prime:     21.10 cm/s TAPSE (M-mode): 2.5 cm LEFT ATRIUM             Index       RIGHT ATRIUM           Index LA diam:        3.90 cm 1.97 cm/m  RA Area:     11.90 cm LA Vol (A2C):   52.4 ml 26.48 ml/m RA Volume:   24.90 ml  12.58 ml/m LA Vol (A4C):   34.0 ml 17.18 ml/m LA Biplane Vol: 42.4 ml 21.43 ml/m   AORTA Ao Root diam: 2.80 cm MITRAL VALVE MV Area (PHT): 3.12 cm              SHUNTS MV PHT:        70.47 msec            Systemic Diam: 2.00 cm MV Decel Time: 243 msec MV E velocity: 101.00 cm/s 103 cm/s MV A velocity: 110.00 cm/s 70.3 cm/s MV E/A ratio:  0.92        1.5  Loralie Champagne MD Electronically signed by Loralie Champagne MD Signature Date/Time: 07/07/2019/6:06:53 PM    Final    VAS US CAROTID  Result Date: 07/08/2019 Carotid Arterial Duplex Study Indications:       CVA and carotid stenosis. Risk Factors:      Hypertension, hyperlipidemia,  Diabetes. Comparison Study:  no prior Performing Technologist: Abram Sander RVS  Examination Guidelines: A complete evaluation includes B-mode imaging, spectral Doppler, color Doppler, and power Doppler as needed of all accessible portions of each vessel. Bilateral testing is considered an integral part of a complete examination. Limited examinations for reoccurring indications may be performed as noted.  Right Carotid Findings: +----------+--------+--------+--------+------------------+--------+           PSV cm/sEDV cm/sStenosisPlaque DescriptionComments +----------+--------+--------+--------+------------------+--------+ CCA Prox  91      28              heterogenous               +----------+--------+--------+--------+------------------+--------+ CCA Distal78      24              heterogenous               +----------+--------+--------+--------+------------------+--------+ ICA  Prox  72      30      1-39%   heterogenous               +----------+--------+--------+--------+------------------+--------+ ICA Distal236     76                                         +----------+--------+--------+--------+------------------+--------+ ECA       77      11                                         +----------+--------+--------+--------+------------------+--------+ +----------+--------+-------+--------+-------------------+           PSV cm/sEDV cmsDescribeArm Pressure (mmHG) +----------+--------+-------+--------+-------------------+ [id number]                                         +----------+--------+-------+--------+-------------------+ +---------+--------+--+--------+--+ VertebralPSV cm/s75EDV cm/s17 +---------+--------+--+--------+--+  Left Carotid Findings: +----------+--------+--------+--------+------------------+--------+           PSV cm/sEDV cm/sStenosisPlaque DescriptionComments +----------+--------+--------+--------+------------------+--------+  CCA Prox  148     41              heterogenous               +----------+--------+--------+--------+------------------+--------+ CCA Distal81      31              heterogenous               +----------+--------+--------+--------+------------------+--------+ ICA Prox  118     50      40-59%  heterogenous               +----------+--------+--------+--------+------------------+--------+ ICA Mid   166     60                                         +----------+--------+--------+--------+------------------+--------+ ICA Distal181     63                                         +----------+--------+--------+--------+------------------+--------+ ECA       54      15                                         +----------+--------+--------+--------+------------------+--------+ +----------+--------+--------+--------+-------------------+           PSV cm/sEDV cm/sDescribeArm Pressure (mmHG) +----------+--------+--------+--------+-------------------+ [id number]                                         +----------+--------+--------+--------+-------------------+ +---------+--------+--+--------+--+---------+ VertebralPSV cm/s67EDV cm/s24Antegrade +---------+--------+--+--------+--+---------+   Summary: Right Carotid: Velocities in the right ICA are consistent with a 1-39% stenosis. Left Carotid: Velocities in the left ICA are consistent with a 40-59% stenosis. Vertebrals: Bilateral vertebral arteries demonstrate antegrade flow. *See table(s) above for measurements and observations.     Preliminary    VAS [place] TRANSCRANIAL  DOPPLER  Result Date: 07/08/2019  Transcranial Doppler Indications: Stroke. Comparison Study: no prior Performing Technologist: Abram Sander RVS  Examination Guidelines: A complete evaluation includes B-mode imaging, spectral Doppler, color Doppler, and power Doppler as needed of all accessible portions of each vessel. Bilateral testing is considered an  integral part of a complete examination. Limited examinations for reoccurring indications may be performed as noted.  +----------+-------------+----------+-----------+-------+ RIGHT TCD Right VM (cm)Depth (cm)PulsatilityComment +----------+-------------+----------+-----------+-------+ ACA           28.00                 1.20            +----------+-------------+----------+-----------+-------+ Term ICA      38.00                 1.27            +----------+-------------+----------+-----------+-------+ PCA           51.00                 0.94            +----------+-------------+----------+-----------+-------+ Opthalmic     20.00                 1.10            +----------+-------------+----------+-----------+-------+ ICA siphon    47.00                 0.65            +----------+-------------+----------+-----------+-------+ Vertebral    -27.00                 0.89            +----------+-------------+----------+-----------+-------+  +----------+------------+----------+-----------+-------+ LEFT TCD  Left VM (cm)Depth (cm)PulsatilityComment +----------+------------+----------+-----------+-------+ MCA          88.00                 0.72            +----------+------------+----------+-----------+-------+ Term ICA     39.00                 0.82            +----------+------------+----------+-----------+-------+ PCA          25.00                 0.93            +----------+------------+----------+-----------+-------+ Opthalmic    20.00                 1.33            +----------+------------+----------+-----------+-------+ ICA siphon   65.00                 0.65            +----------+------------+----------+-----------+-------+  +------------+-------+-------+             VM cm/sComment +------------+-------+-------+ Prox Basilar 69.00         +------------+-------+-------+    Preliminary     Review Of Systems Constitutional: No  fever, chills, weight loss or gain. Eyes: No vision change, wears glasses. No discharge or pain. Ears: No hearing loss, No tinnitus. Respiratory: No asthma, COPD, pneumonias. Positive shortness of breath. No hemoptysis. Cardiovascular: No chest pain, palpitation, leg edema. Gastrointestinal: Positive nausea,no  vomiting, positive diarrhea, no constipation. No GI bleed. No hepatitis. Genitourinary: No dysuria, hematuria, kidney stone. No incontinance. Neurological: Positive headache,  positive stroke, seizures.  Psychiatry: No psych facility admission for anxiety, depression, suicide. No detox. Skin: No rash. Musculoskeletal: Positive joint pain, no fibromyalgia. Positive neck pain, back pain. Lymphadenopathy: No lymphadenopathy. Hematology: Positive anemia, no easy bruising.   Blood pressure 126/62, pulse 74, temperature 98.7 F (37.1 C), temperature source Oral, resp. rate 19, height 5\' 4"  (1.626 m), weight 93.2 kg, SpO2 97 %. Body mass index is 35.27 kg/m. General appearance: alert, cooperative, appears stated age and no distress Head: Normocephalic, atraumatic. Eyes: Brown eyes, pale pink conjunctiva, corneas clear. PERRL, EOM's intact. Neck: No adenopathy, no carotid bruit, no JVD, supple, symmetrical, trachea midline and thyroid not enlarged. Resp: Clear to auscultation bilaterally. Cardio: Regular rate and rhythm, S1, S2 normal, II/VI systolic murmur, no click, rub or gallop GI: Soft, non-tender; bowel sounds normal; no organomegaly. Extremities: No edema, cyanosis or clubbing. Skin: Warm and dry.  Neurologic: Alert and oriented X 3, normal strength. Normal coordination and slow gait.  Assessment/Plan Acute left occipital infarct, embolic Dehydration Acute on chronic renal failure, improving Type 2 DM with hyperglycemia Diabetic peripheral neuropathy Obesity Hypertension OSA Headaches Arthritis GERD Microcytic, hypochromic anemia r/o iron deficiency  Needs TEE and  event monitor. Patient understood procedure and risks. TEE scheduled for Monday.  Time spent: Review of old records, Lab, x-rays, EKG, other cardiac tests, examination, discussion with patient, nurse and referring doctor over 70 minutes.  Birdie Riddle, MD  07/09/2019, 12:34 PM

## 2019-07-09 NOTE — Progress Notes (Signed)
PROGRESS NOTE    [name]  UVO:[id number] DOB: July 19, 1950 DOA: 07/06/2019 PCP: [name], FNP    Brief Narrative:  HPI per Dr. Hal Hope: [name] is a 69 y.o. female with history of diabetes mellitus type 2, hypertension was brought to the ER the patient has been feeling weak and passing out.  As per the patient's grandson who lives with the patient over the last 1 week patient has been passing out whenever she tries to ambulate.  Also has been having some diarrhea last few days.  Has not had any vomiting or chest pain fever chills or shortness of breath.  Recently she had new medication Trulicity added to her diabetic medications.  Otherwise her blood pressure medicines and other medications with same.  Primary care physician was contacted and was instructed to bring to the ER.  Patient did complain of some abdominal epigastric discomfort to the ER physician.  ED Course: In the ER patient was hypotensive with blood pressure in the 74Q systolic was given fluid bolus following which blood pressure improved.  Labs show significantly increased creatinine of 5.6 from normal last October 2020.  LFTs were largely unremarkable.  Albumin was 2.7.  CBC shows hemoglobin of 10.9 at baseline with mild leukocytosis.  Urine shows started bacteria hyaline casts nitrites with negative leukocyte esterase trace no WBCs.  Patient had CT head which was unremarkable CT abdomen shows features concerning for cirrhosis.  Patient was given fluid bolus admitted for acute renal failure with dehydration and syncopal episode.  EKG shows normal sinus rhythm.  On my exam patient appears mildly confused   Assessment & Plan:   Principal Problem:   ARF (acute renal failure) (HCC) Active Problems:   OSA (obstructive sleep apnea)   Benign essential HTN   Diabetes mellitus with nephropathy (HCC)   Anemia associated with chronic renal failure   Cerebral embolism with cerebral  infarction   1. Acute kidney injury.  Likely precipitated from recent diarrhea and ACE inhibitor use.  Creatinine proving, continue IV fluids.  CT abdomen did not show any hydronephrosis.  CK levels were unremarkable.  2. Patchy left occipital infarct, embolic, secondary to unknown etiology concern for PAF. neurology following, recommend aspirin and Plavix for 3 weeks, followed by aspirin alone.  Echocardiogram was unrevealing.  Neurology has recommended aggressive risk factor modifications.  Continue on statin.  Cardiology consulted I spoke to Dr. Einar Gip about loop/event recorder and TEE due to concern for possible PAF causing embolic stroke .TEE on Monday. 3. Syncopal episode.  Likely related to hypotension/dehydration on admission.  No further episodes since admission.  See #1 4. Diabetes.  Continue on Lantus and supplement with sliding scale insulin. 5. Hyperlipidemia.  Continue statin   DVT prophylaxis: Heparin Code Status: Full code Family Communication: None at bedside Disposition Plan: TEE on Monday. Still not stable to be discharged home as her creatinine still elevated, and needs ivf.   Consultants:   Neurology  Procedures:  Echo:1. Left ventricular ejection fraction, by visual estimation, is 65 to 70%. The left ventricle has hyperdynamic function. There is no left ventricular hypertrophy.  2. Left ventricular diastolic parameters are consistent with Grade I diastolic dysfunction (impaired relaxation).  3. The left ventricle has no regional wall motion abnormalities.  4. Global right ventricle has normal systolic function.The right ventricular size is normal. No increase in right ventricular wall thickness.  5. Left atrial size was normal.  6. Right atrial size was normal.  7.  The mitral valve is normal in structure. No evidence of mitral valve regurgitation. No evidence of mitral stenosis.  8. The tricuspid valve is normal in structure. Tricuspid valve regurgitation is not  demonstrated.  9. The aortic valve is tricuspid. Mild aortic valve stenosis. Mean gradient 11 mmHg. 10. The inferior vena cava is normal in size with greater than 50% respiratory variability, suggesting right atrial pressure of 3 mmHg.  11. TR signal is inadequate for assessing pulmonary artery systolic pressure.  Antimicrobials:   None     Subjective: She is feeling better. Denies sob, cp, dizziness.   Objective: Vitals:   07/09/19 0839 07/09/19 0841 07/09/19 1122 07/09/19 1303  BP:  126/62  130/72  Pulse:  74  72  Resp:    16  Temp:    98 F (36.7 C)  TempSrc:    Oral  SpO2: 97%   98%  Weight:      Height:   5\' 4"  (1.626 m)     Intake/Output Summary (Last 24 hours) at 07/09/2019 1422 Last data filed at 07/09/2019 1316 Gross per 24 hour  Intake 671.73 ml  Output --  Net 671.73 ml   Filed Weights   07/06/19 2345  Weight: 93.2 kg    Examination:  General exam: Appears calm and comfortable NAD Respiratory system: Clear to auscultation. Respiratory effort normal. Cardiovascular system: S1 & S2 heard, RRR. No JVD, murmurs, rubs, gallops or clicks.  Gastrointestinal system: Abdomen is nondistended, soft and nontender.  Normal bowel sounds heard. Central nervous system: Alert and oriented x3. CN grossly intact. Extremities: No pedal edema. Skin: Warm and dry Psychiatry: Judgement and insight appear normal. Mood & affect appropriate.     Data Reviewed: I have personally reviewed following labs and imaging studies  CBC: Recent Labs  Lab 07/05/19 1251 07/06/19 2247 07/06/19 2303 07/07/19 0535 07/08/19 0658  WBC 10.8* 7.6  --  8.2 6.5  NEUTROABS  --  3.5  --   --  2.4  HGB 10.9* 9.1* 9.9* 8.7* 9.7*  HCT 39.0 31.9* 29.0* 31.2* 32.8*  MCV 77.2* 79.6*  --  78.2* 74.7*  PLT 246 162  --  149* 660   Basic Metabolic Panel: Recent Labs  Lab 07/05/19 1251 07/05/19 1251 07/06/19 2247 07/06/19 2303 07/07/19 0535 07/07/19 0750 07/08/19 0658 07/09/19 0458  NA  137   < > 135 137  --  137 138 139  K 4.7   < > 4.5 4.4  --  4.3 3.8 3.7  CL 103  --  106  --   --  105 109 108  CO2 18*  --  14*  --   --  18* 18* 20*  GLUCOSE 249*  --  323*  --   --  206* 253* 198*  BUN 66*  --  62*  --   --  50* 34* 20  CREATININE 5.16*   < > 5.22*  --  3.89* 4.17* 2.62* 1.74*  CALCIUM 8.8*  --  7.6*  --   --  8.2* 8.1* 7.9*  MG  --   --   --   --   --   --  1.4*  --   PHOS  --   --   --   --   --   --  3.0  --    < > = values in this interval not displayed.   GFR: Estimated Creatinine Clearance: 35.2 mL/min (A) (by C-G formula based on SCr of  1.74 mg/dL (H)). Liver Function Tests: Recent Labs  Lab 07/06/19 2247 07/07/19 0750 07/08/19 0658  AST 34 32 29  ALT 21 26 27   ALKPHOS 61 72 99  BILITOT 0.7 0.4 0.8  PROT 5.8* 6.8 7.1  ALBUMIN 2.7* 3.2* 3.3*   No results for input(s): LIPASE, AMYLASE in the last 168 hours. Recent Labs  Lab 07/06/19 2250  AMMONIA 20   Coagulation Profile: No results for input(s): INR, PROTIME in the last 168 hours. Cardiac Enzymes: No results for input(s): CKTOTAL, CKMB, CKMBINDEX, TROPONINI in the last 168 hours. BNP (last 3 results) No results for input(s): PROBNP in the last 8760 hours. HbA1C: Recent Labs    07/08/19 0658  HGBA1C 12.3*   CBG: Recent Labs  Lab 07/08/19 1039 07/08/19 1641 07/08/19 2132 07/09/19 0605 07/09/19 1104  GLUCAP 284* 270* 236* 194* 251*   Lipid Profile: Recent Labs    07/08/19 0658  CHOL 91  HDL 32*  LDLCALC 20  TRIG 196*  CHOLHDL 2.8   Thyroid Function Tests: No results for input(s): TSH, T4TOTAL, FREET4, T3FREE, THYROIDAB in the last 72 hours. Anemia Panel: No results for input(s): VITAMINB12, FOLATE, FERRITIN, TIBC, IRON, RETICCTPCT in the last 72 hours. Sepsis Labs: Recent Labs  Lab 07/06/19 2247 07/07/19 0159 07/07/19 0750 07/07/19 0916  LATICACIDVEN 6.4* 4.4* 3.7* 3.9*    Recent Results (from the past 240 hour(s))  Respiratory Panel by RT PCR (Flu A&B, Covid) -  Nasopharyngeal Swab     Status: None   Collection Time: 07/06/19 10:50 PM   Specimen: Nasopharyngeal Swab  Result Value Ref Range Status   SARS Coronavirus 2 by RT PCR NEGATIVE NEGATIVE Final    Comment: (NOTE) SARS-CoV-2 target nucleic acids are NOT DETECTED. The SARS-CoV-2 RNA is generally detectable in upper respiratoy specimens during the acute phase of infection. The lowest concentration of SARS-CoV-2 viral copies this assay can detect is 131 copies/mL. A negative result does not preclude SARS-Cov-2 infection and should not be used as the sole basis for treatment or other patient management decisions. A negative result may occur with  improper specimen collection/handling, submission of specimen other than nasopharyngeal swab, presence of viral mutation(s) within the areas targeted by this assay, and inadequate number of viral copies (<131 copies/mL). A negative result must be combined with clinical observations, patient history, and epidemiological information. The expected result is Negative. Fact Sheet for Patients:  PinkCheek.be Fact Sheet for Healthcare Providers:  GravelBags.it This test is not yet ap proved or cleared by the [place] FDA and  has been authorized for detection and/or diagnosis of SARS-CoV-2 by FDA under an Emergency Use Authorization (EUA). This EUA will remain  in effect (meaning this test can be used) for the duration of the COVID-19 declaration under Section 564(b)(1) of the Act, 21 U.S.C. section 360bbb-3(b)(1), unless the authorization is terminated or revoked sooner.    Influenza A by PCR NEGATIVE NEGATIVE Final   Influenza B by PCR NEGATIVE NEGATIVE Final    Comment: (NOTE) The Xpert Xpress SARS-CoV-2/FLU/RSV assay is intended as an aid in  the diagnosis of influenza from Nasopharyngeal swab specimens and  should not be used as a sole basis for treatment. Nasal washings and  aspirates  are unacceptable for Xpert Xpress SARS-CoV-2/FLU/RSV  testing. Fact Sheet for Patients: PinkCheek.be Fact Sheet for Healthcare Providers: GravelBags.it This test is not yet approved or cleared by the [place] FDA and  has been authorized for detection and/or diagnosis of SARS-CoV-2 by  FDA  under an Emergency Use Authorization (EUA). This EUA will remain  in effect (meaning this test can be used) for the duration of the  Covid-19 declaration under Section 564(b)(1) of the Act, 21  U.S.C. section 360bbb-3(b)(1), unless the authorization is  terminated or revoked. Performed at [facility], [place] [address] [place], [place] [address]   Culture, blood (Routine X 2) w Reflex to ID Panel     Status: None (Preliminary result)   Collection Time: 07/08/19  7:35 AM   Specimen: BLOOD LEFT HAND  Result Value Ref Range Status   Specimen Description BLOOD LEFT HAND  Final   Special Requests   Final    BOTTLES DRAWN AEROBIC AND ANAEROBIC Blood Culture results may not be optimal due to an inadequate volume of blood received in culture bottles   Culture   Final    NO GROWTH < 24 HOURS Performed at [facility], [place] [address] [place], [place] [address]    Report Status PENDING  Incomplete  Culture, blood (Routine X 2) w Reflex to ID Panel     Status: None (Preliminary result)   Collection Time: 07/08/19  7:47 AM   Specimen: BLOOD RIGHT HAND  Result Value Ref Range Status   Specimen Description BLOOD RIGHT HAND  Final   Special Requests   Final    BOTTLES DRAWN AEROBIC ONLY Blood Culture results may not be optimal due to an inadequate volume of blood received in culture bottles   Culture   Final    NO GROWTH < 24 HOURS Performed at [facility], [place] [address], [place], [place] [address]    Report Status PENDING  Incomplete         Radiology Studies: ECHOCARDIOGRAM COMPLETE  Result Date: 07/07/2019    ECHOCARDIOGRAM REPORT   Patient Name:   BRYNNLEY DAYRIT [facility] Date of Exam: 07/07/2019 Medical Rec #:  [id number]               Height:       64.0 in Accession #:    [id number]              Weight:       205.5 lb Date of Birth:  1951/04/26               BSA:          1.98 m Patient Age:    65 years                BP:           117/54 mmHg Patient Gender: F                       HR:           47 bpm. Exam Location:  Inpatient Procedure: 2D Echo Indications:    syncope 780.2  History:        Patient has prior history of Echocardiogram examinations, most                 recent 06/22/2018. Risk Factors:Diabetes and Hypertension.  Sonographer:    Johny Chess Referring Phys: [id number] OMAIR LATIF [place]  1. Left ventricular ejection fraction, by visual estimation, is 65 to 70%. The left ventricle has hyperdynamic function. There is no left ventricular hypertrophy.  2. Left ventricular diastolic parameters are consistent with Grade I diastolic dysfunction (impaired relaxation).  3. The left ventricle has no regional wall  motion abnormalities.  4. Global right ventricle has normal systolic function.The right ventricular size is normal. No increase in right ventricular wall thickness.  5. Left atrial size was normal.  6. Right atrial size was normal.  7. The mitral valve is normal in structure. No evidence of mitral valve regurgitation. No evidence of mitral stenosis.  8. The tricuspid valve is normal in structure. Tricuspid valve regurgitation is not demonstrated.  9. The aortic valve is tricuspid. Mild aortic valve stenosis. Mean gradient 11 mmHg. 10. The inferior vena cava is normal in size with greater than 50% respiratory variability, suggesting right atrial pressure of 3 mmHg. 11. TR signal is inadequate for assessing pulmonary artery systolic pressure. FINDINGS  Left Ventricle: Left ventricular ejection fraction, by visual estimation, is 65 to 70%. The left ventricle has hyperdynamic function. The left  ventricle has no regional wall motion abnormalities. There is no left ventricular hypertrophy. Left ventricular diastolic parameters are consistent with Grade I diastolic dysfunction (impaired relaxation). Right Ventricle: The right ventricular size is normal. No increase in right ventricular wall thickness. Global RV systolic function is has normal systolic function. Left Atrium: Left atrial size was normal in size. Right Atrium: Right atrial size was normal in size Pericardium: There is no evidence of pericardial effusion. Mitral Valve: The mitral valve is normal in structure. No evidence of mitral valve regurgitation. No evidence of mitral valve stenosis by observation. Tricuspid Valve: The tricuspid valve is normal in structure. Tricuspid valve regurgitation is not demonstrated. Aortic Valve: The aortic valve is tricuspid. Aortic valve regurgitation is not visualized. Mild aortic stenosis is present. Pulmonic Valve: The pulmonic valve was normal in structure. Pulmonic valve regurgitation is not visualized. Pulmonic regurgitation is not visualized. Aorta: The aortic root is normal in size and structure. Venous: The inferior vena cava is normal in size with greater than 50% respiratory variability, suggesting right atrial pressure of 3 mmHg. IAS/Shunts: No atrial level shunt detected by color flow Doppler.  LEFT VENTRICLE PLAX 2D LVIDd:         4.90 cm  Diastology LVIDs:         2.60 cm  LV e' lateral:   12.00 cm/s LV PW:         1.00 cm  LV E/e' lateral: 8.4 LV IVS:        0.80 cm  LV e' medial:    8.38 cm/s LVOT diam:     2.00 cm  LV E/e' medial:  12.1 LV SV:         88 ml LV SV Index:   42.16 LVOT Area:     3.14 cm  RIGHT VENTRICLE RV S prime:     21.10 cm/s TAPSE (M-mode): 2.5 cm LEFT ATRIUM             Index       RIGHT ATRIUM           Index LA diam:        3.90 cm 1.97 cm/m  RA Area:     11.90 cm LA Vol (A2C):   52.4 ml 26.48 ml/m RA Volume:   24.90 ml  12.58 ml/m LA Vol (A4C):   34.0 ml 17.18 ml/m LA  Biplane Vol: 42.4 ml 21.43 ml/m   AORTA Ao Root diam: 2.80 cm MITRAL VALVE MV Area (PHT): 3.12 cm              SHUNTS MV PHT:        70.47 msec  Systemic Diam: 2.00 cm MV Decel Time: 243 msec MV E velocity: 101.00 cm/s 103 cm/s MV A velocity: 110.00 cm/s 70.3 cm/s MV E/A ratio:  0.92        1.5  Loralie Champagne MD Electronically signed by Loralie Champagne MD Signature Date/Time: 07/07/2019/6:06:53 PM    Final    VAS US CAROTID  Result Date: 07/08/2019 Carotid Arterial Duplex Study Indications:       CVA and carotid stenosis. Risk Factors:      Hypertension, hyperlipidemia, Diabetes. Comparison Study:  no prior Performing Technologist: Abram Sander RVS  Examination Guidelines: A complete evaluation includes B-mode imaging, spectral Doppler, color Doppler, and power Doppler as needed of all accessible portions of each vessel. Bilateral testing is considered an integral part of a complete examination. Limited examinations for reoccurring indications may be performed as noted.  Right Carotid Findings: +----------+--------+--------+--------+------------------+--------+           PSV cm/sEDV cm/sStenosisPlaque DescriptionComments +----------+--------+--------+--------+------------------+--------+ CCA Prox  91      28              heterogenous               +----------+--------+--------+--------+------------------+--------+ CCA Distal78      24              heterogenous               +----------+--------+--------+--------+------------------+--------+ ICA Prox  72      30      1-39%   heterogenous               +----------+--------+--------+--------+------------------+--------+ ICA Distal236     76                                         +----------+--------+--------+--------+------------------+--------+ ECA       77      11                                         +----------+--------+--------+--------+------------------+--------+  +----------+--------+-------+--------+-------------------+           PSV cm/sEDV cmsDescribeArm Pressure (mmHG) +----------+--------+-------+--------+-------------------+ [id number]                                         +----------+--------+-------+--------+-------------------+ +---------+--------+--+--------+--+ VertebralPSV cm/s75EDV cm/s17 +---------+--------+--+--------+--+  Left Carotid Findings: +----------+--------+--------+--------+------------------+--------+           PSV cm/sEDV cm/sStenosisPlaque DescriptionComments +----------+--------+--------+--------+------------------+--------+ CCA Prox  148     41              heterogenous               +----------+--------+--------+--------+------------------+--------+ CCA Distal81      31              heterogenous               +----------+--------+--------+--------+------------------+--------+ ICA Prox  118     50      40-59%  heterogenous               +----------+--------+--------+--------+------------------+--------+ ICA Mid   166     60                                         +----------+--------+--------+--------+------------------+--------+  ICA Distal181     63                                         +----------+--------+--------+--------+------------------+--------+ ECA       54      15                                         +----------+--------+--------+--------+------------------+--------+ +----------+--------+--------+--------+-------------------+           PSV cm/sEDV cm/sDescribeArm Pressure (mmHG) +----------+--------+--------+--------+-------------------+ [id number]                                         +----------+--------+--------+--------+-------------------+ +---------+--------+--+--------+--+---------+ VertebralPSV cm/s67EDV cm/s24Antegrade +---------+--------+--+--------+--+---------+   Summary: Right Carotid: Velocities in the right ICA are  consistent with a 1-39% stenosis. Left Carotid: Velocities in the left ICA are consistent with a 40-59% stenosis. Vertebrals: Bilateral vertebral arteries demonstrate antegrade flow. *See table(s) above for measurements and observations.     Preliminary    VAS [place] TRANSCRANIAL DOPPLER  Result Date: 07/08/2019  Transcranial Doppler Indications: Stroke. Comparison Study: no prior Performing Technologist: Abram Sander RVS  Examination Guidelines: A complete evaluation includes B-mode imaging, spectral Doppler, color Doppler, and power Doppler as needed of all accessible portions of each vessel. Bilateral testing is considered an integral part of a complete examination. Limited examinations for reoccurring indications may be performed as noted.  +----------+-------------+----------+-----------+-------+ RIGHT TCD Right VM (cm)Depth (cm)PulsatilityComment +----------+-------------+----------+-----------+-------+ ACA           28.00                 1.20            +----------+-------------+----------+-----------+-------+ Term ICA      38.00                 1.27            +----------+-------------+----------+-----------+-------+ PCA           51.00                 0.94            +----------+-------------+----------+-----------+-------+ Opthalmic     20.00                 1.10            +----------+-------------+----------+-----------+-------+ ICA siphon    47.00                 0.65            +----------+-------------+----------+-----------+-------+ Vertebral    -27.00                 0.89            +----------+-------------+----------+-----------+-------+  +----------+------------+----------+-----------+-------+ LEFT TCD  Left VM (cm)Depth (cm)PulsatilityComment +----------+------------+----------+-----------+-------+ MCA          88.00                 0.72            +----------+------------+----------+-----------+-------+ Term ICA     39.00                  0.82            +----------+------------+----------+-----------+-------+  PCA          25.00                 0.93            +----------+------------+----------+-----------+-------+ Opthalmic    20.00                 1.33            +----------+------------+----------+-----------+-------+ ICA siphon   65.00                 0.65            +----------+------------+----------+-----------+-------+  +------------+-------+-------+             VM cm/sComment +------------+-------+-------+ Prox Basilar 69.00         +------------+-------+-------+    Preliminary         Scheduled Meds: . amLODipine  10 mg Oral Daily  . aspirin EC  81 mg Oral Daily  . atorvastatin  20 mg Oral Daily  . clopidogrel  75 mg Oral Daily  . cyclobenzaprine  5 mg Oral QHS  . DULoxetine  20 mg Oral Daily  . heparin  5,000 Units Subcutaneous Q8H  . insulin aspart  0-9 Units Subcutaneous TID WC  . insulin glargine  20 Units Subcutaneous QHS  . mometasone-formoterol  2 puff Inhalation BID  . pantoprazole  40 mg Oral Daily  . pregabalin  200 mg Oral TID  . topiramate  100 mg Oral Daily   Continuous Infusions: . sodium chloride 100 mL/hr at 07/09/19 1025     LOS: 2 days    Time spent: 45 min with >50% on coc   Nolberto Hanlon, MD [facility]   If 7PM-7AM, please contact night-coverage [email or web address]  07/09/2019, 2:22 PM  Patient ID: [name], female   DOB: [birth year], 69 y.o.   MRN: [id number]

## 2019-07-10 LAB — COMPREHENSIVE METABOLIC PANEL
ALT: 22 U/L (ref 0–44)
AST: 17 U/L (ref 15–41)
Albumin: 2.7 g/dL — ABNORMAL LOW (ref 3.5–5.0)
Alkaline Phosphatase: 75 U/L (ref 38–126)
Anion gap: 9 (ref 5–15)
BUN: 16 mg/dL (ref 8–23)
CO2: 20 mmol/L — ABNORMAL LOW (ref 22–32)
Calcium: 7.9 mg/dL — ABNORMAL LOW (ref 8.9–10.3)
Chloride: 111 mmol/L (ref 98–111)
Creatinine, Ser: 1.25 mg/dL — ABNORMAL HIGH (ref 0.44–1.00)
GFR calc Af Amer: 52 mL/min — ABNORMAL LOW (ref >60)
GFR calc non Af Amer: 45 mL/min — ABNORMAL LOW (ref >60)
Glucose, Bld: 227 mg/dL — ABNORMAL HIGH (ref 70–99)
Potassium: 3.8 mmol/L (ref 3.5–5.1)
Sodium: 140 mmol/L (ref 135–145)
Total Bilirubin: 0.9 mg/dL (ref 0.3–1.2)
Total Protein: 5.7 g/dL — ABNORMAL LOW (ref 6.5–8.1)

## 2019-07-10 LAB — CBC
HCT: 28.9 % — ABNORMAL LOW (ref 36.0–46.0)
Hemoglobin: 8.3 g/dL — ABNORMAL LOW (ref 12.0–15.0)
MCH: 22.1 pg — ABNORMAL LOW (ref 26.0–34.0)
MCHC: 28.7 g/dL — ABNORMAL LOW (ref 30.0–36.0)
MCV: 77.1 fL — ABNORMAL LOW (ref 80.0–100.0)
Platelets: 95 10*3/uL — ABNORMAL LOW (ref 150–400)
RBC: 3.75 MIL/uL — ABNORMAL LOW (ref 3.87–5.11)
RDW: 19.2 % — ABNORMAL HIGH (ref 11.5–15.5)
WBC: 4.1 10*3/uL (ref 4.0–10.5)
nRBC: 0 % (ref 0.0–0.2)

## 2019-07-10 LAB — GLUCOSE, CAPILLARY
Glucose-Capillary: 150 mg/dL — ABNORMAL HIGH (ref 70–99)
Glucose-Capillary: 203 mg/dL — ABNORMAL HIGH (ref 70–99)
Glucose-Capillary: 231 mg/dL — ABNORMAL HIGH (ref 70–99)
Glucose-Capillary: 193 mg/dL — ABNORMAL HIGH (ref 70–99)

## 2019-07-10 MED ORDER — ASCORBIC ACID 500 MG PO TABS
250.0000 mg | ORAL_TABLET | Freq: Every day | ORAL | Status: DC
  Administered 2019-07-10 – 2019-07-12 (×3): 250 mg via ORAL
  Filled 2019-07-10 (×3): qty 1

## 2019-07-10 MED ORDER — ALLOPURINOL 100 MG PO TABS
100.0000 mg | ORAL_TABLET | Freq: Every day | ORAL | Status: DC
  Administered 2019-07-10 – 2019-07-12 (×3): 100 mg via ORAL
  Filled 2019-07-10 (×3): qty 1

## 2019-07-10 MED ORDER — FERROUS SULFATE 325 (65 FE) MG PO TABS
325.0000 mg | ORAL_TABLET | Freq: Every day | ORAL | Status: DC
  Administered 2019-07-10 – 2019-07-12 (×3): 325 mg via ORAL
  Filled 2019-07-10 (×3): qty 1

## 2019-07-10 NOTE — Progress Notes (Signed)
PROGRESS NOTE    [name]  NOM:[id number] DOB: Jun 13, 1951 DOA: 07/06/2019 PCP: [name], FNP    Brief Narrative:  HPI per Dr. Hal Hope: [name] is a 69 y.o. female with history of diabetes mellitus type 2, hypertension was brought to the ER the patient has been feeling weak and passing out.  As per the patient's grandson who lives with the patient over the last 1 week patient has been passing out whenever she tries to ambulate.  Also has been having some diarrhea last few days.  Has not had any vomiting or chest pain fever chills or shortness of breath.  Recently she had new medication Trulicity added to her diabetic medications.  Otherwise her blood pressure medicines and other medications with same.  Primary care physician was contacted and was instructed to bring to the ER.  Patient did complain of some abdominal epigastric discomfort to the ER physician.  ED Course: In the ER patient was hypotensive with blood pressure in the 96G systolic was given fluid bolus following which blood pressure improved.  Labs show significantly increased creatinine of 5.6 from normal last October 2020.  LFTs were largely unremarkable.  Albumin was 2.7.  CBC shows hemoglobin of 10.9 at baseline with mild leukocytosis.  Urine shows started bacteria hyaline casts nitrites with negative leukocyte esterase trace no WBCs.  Patient had CT head which was unremarkable CT abdomen shows features concerning for cirrhosis.  Patient was given fluid bolus admitted for acute renal failure with dehydration and syncopal episode.  EKG shows normal sinus rhythm.  On my exam patient appears mildly confused   Assessment & Plan:   Principal Problem:   ARF (acute renal failure) (HCC) Active Problems:   OSA (obstructive sleep apnea)   Benign essential HTN   Diabetes mellitus with nephropathy (HCC)   Anemia associated with chronic renal failure   Cerebral embolism with cerebral  infarction   1. Acute kidney injury.  Likely precipitated from recent diarrhea and ACE inhibitor use.  Creatinine proving, continue IV fluids, no hx of CKD.  CT abdomen did not show any hydronephrosis.  CK levels were unremarkable.  2. Patchy left occipital infarct, embolic, secondary to unknown etiology concern for PAF. neurology following, recommend aspirin and Plavix for 3 weeks, followed by aspirin alone.  Echocardiogram was unrevealing.  Neurology has recommended aggressive risk factor modifications.  Continue on statin.  Cardiology consulted about loop/event recorder and TEE due to concern for possible PAF causing embolic stroke. TEE on Monday 1/25. 3. Syncopal episode.  Likely related to hypotension/dehydration on admission.  No further episodes since admission.  See #1 4. Diabetes.  Continue on Lantus and supplement with sliding scale insulin. 5. Hyperlipidemia.  Continue statin  DVT prophylaxis: Heparin Code Status: Full code Family Communication: None at bedside Disposition Plan: TEE on Monday. Still not stable to be discharged home as her creatinine still elevated, and needs ivf.  Consultants:   Neurology  Cardiology  Procedures:  Echo:1. Left ventricular ejection fraction, by visual estimation, is 65 to 70%. The left ventricle has hyperdynamic function. There is no left ventricular hypertrophy.  2. Left ventricular diastolic parameters are consistent with Grade I diastolic dysfunction (impaired relaxation).  3. The left ventricle has no regional wall motion abnormalities.  4. Global right ventricle has normal systolic function.The right ventricular size is normal. No increase in right ventricular wall thickness.  5. Left atrial size was normal.  6. Right atrial size was normal.  7.  The mitral valve is normal in structure. No evidence of mitral valve regurgitation. No evidence of mitral stenosis.  8. The tricuspid valve is normal in structure. Tricuspid valve regurgitation is  not demonstrated.  9. The aortic valve is tricuspid. Mild aortic valve stenosis. Mean gradient 11 mmHg. 10. The inferior vena cava is normal in size with greater than 50% respiratory variability, suggesting right atrial pressure of 3 mmHg.  11. TR signal is inadequate for assessing pulmonary artery systolic pressure.  Antimicrobials:   None  Subjective: She is feeling better. Denies sob, cp, dizziness. Resting in bed this AM, has been ambulating was evaluated by PT/OT.  Objective: Vitals:   07/09/19 2115 07/10/19 0045 07/10/19 0427 07/10/19 0838  BP: 132/70  119/61 135/83  Pulse: 80  75 73  Resp: 18 17 15    Temp: 98 F (36.7 C)  98.4 F (36.9 C)   TempSrc: Oral  Oral   SpO2: 98%  97%   Weight:      Height:        Intake/Output Summary (Last 24 hours) at 07/10/2019 0945 Last data filed at 07/10/2019 0752 Gross per 24 hour  Intake 720 ml  Output 50 ml  Net 670 ml   Filed Weights   07/06/19 2345  Weight: 93.2 kg    Examination:  General exam: Appears calm and comfortable NAD Respiratory system: Clear to auscultation. Respiratory effort normal. Cardiovascular system: S1 & S2 heard, RRR. No JVD, murmurs, rubs, gallops or clicks.  Gastrointestinal system: Abdomen is nondistended, soft and nontender.  Normal bowel sounds heard. Central nervous system: Alert and oriented x3. CN grossly intact. Extremities: No pedal edema. Skin: Warm and dry Psychiatry: Judgement and insight appear normal. Mood & affect appropriate.     Data Reviewed: I have personally reviewed following labs and imaging studies  CBC: Recent Labs  Lab 07/05/19 1251 07/05/19 1251 07/06/19 2247 07/06/19 2303 07/07/19 0535 07/08/19 0658 07/10/19 0225  WBC 10.8*  --  7.6  --  8.2 6.5 4.1  NEUTROABS  --   --  3.5  --   --  2.4  --   HGB 10.9*   < > 9.1* 9.9* 8.7* 9.7* 8.3*  HCT 39.0   < > 31.9* 29.0* 31.2* 32.8* 28.9*  MCV 77.2*  --  79.6*  --  78.2* 74.7* 77.1*  PLT 246  --  162  --  149* 154  95*   < > = values in this interval not displayed.   Basic Metabolic Panel: Recent Labs  Lab 07/06/19 2247 07/06/19 2247 07/06/19 2303 07/07/19 0535 07/07/19 0750 07/08/19 0658 07/09/19 0458 07/10/19 0225  NA 135   < > 137  --  137 138 139 140  K 4.5   < > 4.4  --  4.3 3.8 3.7 3.8  CL 106  --   --   --  105 109 108 111  CO2 14*  --   --   --  18* 18* 20* 20*  GLUCOSE 323*  --   --   --  206* 253* 198* 227*  BUN 62*  --   --   --  50* 34* 20 16  CREATININE 5.22*   < >  --  3.89* 4.17* 2.62* 1.74* 1.25*  CALCIUM 7.6*  --   --   --  8.2* 8.1* 7.9* 7.9*  MG  --   --   --   --   --  1.4*  --   --  PHOS  --   --   --   --   --  3.0  --   --    < > = values in this interval not displayed.   GFR: Estimated Creatinine Clearance: 49 mL/min (A) (by C-G formula based on SCr of 1.25 mg/dL (H)). Liver Function Tests: Recent Labs  Lab 07/06/19 2247 07/07/19 0750 07/08/19 0658 07/10/19 0225  AST 34 32 29 17  ALT 21 26 27 22   ALKPHOS 61 72 99 75  BILITOT 0.7 0.4 0.8 0.9  PROT 5.8* 6.8 7.1 5.7*  ALBUMIN 2.7* 3.2* 3.3* 2.7*   No results for input(s): LIPASE, AMYLASE in the last 168 hours. Recent Labs  Lab 07/06/19 2250  AMMONIA 20   Coagulation Profile: No results for input(s): INR, PROTIME in the last 168 hours. Cardiac Enzymes: No results for input(s): CKTOTAL, CKMB, CKMBINDEX, TROPONINI in the last 168 hours. BNP (last 3 results) No results for input(s): PROBNP in the last 8760 hours. HbA1C: Recent Labs    07/08/19 0658  HGBA1C 12.3*   CBG: Recent Labs  Lab 07/09/19 0605 07/09/19 1104 07/09/19 1556 07/09/19 2118 07/10/19 0619  GLUCAP 194* 251* 223* 250* 150*   Lipid Profile: Recent Labs    07/08/19 0658  CHOL 91  HDL 32*  LDLCALC 20  TRIG 196*  CHOLHDL 2.8   Thyroid Function Tests: No results for input(s): TSH, T4TOTAL, FREET4, T3FREE, THYROIDAB in the last 72 hours. Anemia Panel: Recent Labs    07/09/19 1330  FERRITIN 24  TIBC 392  IRON 30    Sepsis Labs: Recent Labs  Lab 07/06/19 2247 07/07/19 0159 07/07/19 0750 07/07/19 0916  LATICACIDVEN 6.4* 4.4* 3.7* 3.9*    Recent Results (from the past 240 hour(s))  Respiratory Panel by RT PCR (Flu A&B, Covid) - Nasopharyngeal Swab     Status: None   Collection Time: 07/06/19 10:50 PM   Specimen: Nasopharyngeal Swab  Result Value Ref Range Status   SARS Coronavirus 2 by RT PCR NEGATIVE NEGATIVE Final    Comment: (NOTE) SARS-CoV-2 target nucleic acids are NOT DETECTED. The SARS-CoV-2 RNA is generally detectable in upper respiratoy specimens during the acute phase of infection. The lowest concentration of SARS-CoV-2 viral copies this assay can detect is 131 copies/mL. A negative result does not preclude SARS-Cov-2 infection and should not be used as the sole basis for treatment or other patient management decisions. A negative result may occur with  improper specimen collection/handling, submission of specimen other than nasopharyngeal swab, presence of viral mutation(s) within the areas targeted by this assay, and inadequate number of viral copies (<131 copies/mL). A negative result must be combined with clinical observations, patient history, and epidemiological information. The expected result is Negative. Fact Sheet for Patients:  PinkCheek.be Fact Sheet for Healthcare Providers:  GravelBags.it This test is not yet ap proved or cleared by the [place] FDA and  has been authorized for detection and/or diagnosis of SARS-CoV-2 by FDA under an Emergency Use Authorization (EUA). This EUA will remain  in effect (meaning this test can be used) for the duration of the COVID-19 declaration under Section 564(b)(1) of the Act, 21 U.S.C. section 360bbb-3(b)(1), unless the authorization is terminated or revoked sooner.    Influenza A by PCR NEGATIVE NEGATIVE Final   Influenza B by PCR NEGATIVE NEGATIVE Final     Comment: (NOTE) The Xpert Xpress SARS-CoV-2/FLU/RSV assay is intended as an aid in  the diagnosis of influenza from Nasopharyngeal swab specimens and  should not be used as a sole basis for treatment. Nasal washings and  aspirates are unacceptable for Xpert Xpress SARS-CoV-2/FLU/RSV  testing. Fact Sheet for Patients: PinkCheek.be Fact Sheet for Healthcare Providers: GravelBags.it This test is not yet approved or cleared by the [place] FDA and  has been authorized for detection and/or diagnosis of SARS-CoV-2 by  FDA under an Emergency Use Authorization (EUA). This EUA will remain  in effect (meaning this test can be used) for the duration of the  Covid-19 declaration under Section 564(b)(1) of the Act, 21  U.S.C. section 360bbb-3(b)(1), unless the authorization is  terminated or revoked. Performed at [facility], [place] [address] [place], [place] [address]   Culture, blood (Routine X 2) w Reflex to ID Panel     Status: None (Preliminary result)   Collection Time: 07/08/19  7:35 AM   Specimen: BLOOD LEFT HAND  Result Value Ref Range Status   Specimen Description BLOOD LEFT HAND  Final   Special Requests   Final    BOTTLES DRAWN AEROBIC AND ANAEROBIC Blood Culture results may not be optimal due to an inadequate volume of blood received in culture bottles   Culture   Final    NO GROWTH < 24 HOURS Performed at [facility], [place] [address], [place], [place] [address]    Report Status PENDING  Incomplete  Culture, blood (Routine X 2) w Reflex to ID Panel     Status: None (Preliminary result)   Collection Time: 07/08/19  7:47 AM   Specimen: BLOOD RIGHT HAND  Result Value Ref Range Status   Specimen Description BLOOD RIGHT HAND  Final   Special Requests   Final    BOTTLES DRAWN AEROBIC ONLY Blood Culture results may not be optimal due to an inadequate volume of blood received in culture bottles   Culture   Final     NO GROWTH < 24 HOURS Performed at [facility], [place] [address] [place], [place] [address]    Report Status PENDING  Incomplete         Radiology Studies: VAS US CAROTID  Result Date: 07/08/2019 Carotid Arterial Duplex Study Indications:       CVA and carotid stenosis. Risk Factors:      Hypertension, hyperlipidemia, Diabetes. Comparison Study:  no prior Performing Technologist: Abram Sander RVS  Examination Guidelines: A complete evaluation includes B-mode imaging, spectral Doppler, color Doppler, and power Doppler as needed of all accessible portions of each vessel. Bilateral testing is considered an integral part of a complete examination. Limited examinations for reoccurring indications may be performed as noted.  Right Carotid Findings: +----------+--------+--------+--------+------------------+--------+           PSV cm/sEDV cm/sStenosisPlaque DescriptionComments +----------+--------+--------+--------+------------------+--------+ CCA Prox  91      28              heterogenous               +----------+--------+--------+--------+------------------+--------+ CCA Distal78      24              heterogenous               +----------+--------+--------+--------+------------------+--------+ ICA Prox  72      30      1-39%   heterogenous               +----------+--------+--------+--------+------------------+--------+ ICA Distal236     76                                         +----------+--------+--------+--------+------------------+--------+  ECA       77      11                                         +----------+--------+--------+--------+------------------+--------+ +----------+--------+-------+--------+-------------------+           PSV cm/sEDV cmsDescribeArm Pressure (mmHG) +----------+--------+-------+--------+-------------------+ [id number]                                          +----------+--------+-------+--------+-------------------+ +---------+--------+--+--------+--+ VertebralPSV cm/s75EDV cm/s17 +---------+--------+--+--------+--+  Left Carotid Findings: +----------+--------+--------+--------+------------------+--------+           PSV cm/sEDV cm/sStenosisPlaque DescriptionComments +----------+--------+--------+--------+------------------+--------+ CCA Prox  148     41              heterogenous               +----------+--------+--------+--------+------------------+--------+ CCA Distal81      31              heterogenous               +----------+--------+--------+--------+------------------+--------+ ICA Prox  118     50      40-59%  heterogenous               +----------+--------+--------+--------+------------------+--------+ ICA Mid   166     60                                         +----------+--------+--------+--------+------------------+--------+ ICA Distal181     63                                         +----------+--------+--------+--------+------------------+--------+ ECA       54      15                                         +----------+--------+--------+--------+------------------+--------+ +----------+--------+--------+--------+-------------------+           PSV cm/sEDV cm/sDescribeArm Pressure (mmHG) +----------+--------+--------+--------+-------------------+ [id number]                                         +----------+--------+--------+--------+-------------------+ +---------+--------+--+--------+--+---------+ VertebralPSV cm/s67EDV cm/s24Antegrade +---------+--------+--+--------+--+---------+   Summary: Right Carotid: Velocities in the right ICA are consistent with a 1-39% stenosis. Left Carotid: Velocities in the left ICA are consistent with a 40-59% stenosis. Vertebrals: Bilateral vertebral arteries demonstrate antegrade flow. *See table(s) above for measurements and observations.      Preliminary    VAS [place] TRANSCRANIAL DOPPLER  Result Date: 07/08/2019  Transcranial Doppler Indications: Stroke. Comparison Study: no prior Performing Technologist: Abram Sander RVS  Examination Guidelines: A complete evaluation includes B-mode imaging, spectral Doppler, color Doppler, and power Doppler as needed of all accessible portions of each vessel. Bilateral testing is considered an integral part of a complete examination. Limited examinations for reoccurring indications may be performed as noted.  +----------+-------------+----------+-----------+-------+ RIGHT TCD Right VM (cm)Depth (cm)PulsatilityComment +----------+-------------+----------+-----------+-------+ ACA  28.00                 1.20            +----------+-------------+----------+-----------+-------+ Term ICA      38.00                 1.27            +----------+-------------+----------+-----------+-------+ PCA           51.00                 0.94            +----------+-------------+----------+-----------+-------+ Opthalmic     20.00                 1.10            +----------+-------------+----------+-----------+-------+ ICA siphon    47.00                 0.65            +----------+-------------+----------+-----------+-------+ Vertebral    -27.00                 0.89            +----------+-------------+----------+-----------+-------+  +----------+------------+----------+-----------+-------+ LEFT TCD  Left VM (cm)Depth (cm)PulsatilityComment +----------+------------+----------+-----------+-------+ MCA          88.00                 0.72            +----------+------------+----------+-----------+-------+ Term ICA     39.00                 0.82            +----------+------------+----------+-----------+-------+ PCA          25.00                 0.93            +----------+------------+----------+-----------+-------+ Opthalmic    20.00                 1.33             +----------+------------+----------+-----------+-------+ ICA siphon   65.00                 0.65            +----------+------------+----------+-----------+-------+  +------------+-------+-------+             VM cm/sComment +------------+-------+-------+ Prox Basilar 69.00         +------------+-------+-------+    Preliminary         Scheduled Meds: . allopurinol  100 mg Oral Daily  . amLODipine  10 mg Oral Daily  . vitamin C  250 mg Oral Q breakfast  . aspirin EC  81 mg Oral Daily  . atorvastatin  20 mg Oral Daily  . clopidogrel  75 mg Oral Daily  . cyclobenzaprine  5 mg Oral QHS  . DULoxetine  20 mg Oral Daily  . ferrous sulfate  325 mg Oral Q breakfast  . heparin  5,000 Units Subcutaneous Q8H  . insulin aspart  0-9 Units Subcutaneous TID WC  . insulin glargine  20 Units Subcutaneous QHS  . mometasone-formoterol  2 puff Inhalation BID  . pantoprazole  40 mg Oral Daily  . pregabalin  200 mg Oral TID  . topiramate  100 mg Oral Daily   Continuous Infusions: . sodium chloride 100 mL/hr at 07/10/19 [phone]  LOS: 3 days   Time spent: 26 min with >50% on coc  Burnett Spray Marry Guan, MD [facility]  If 7PM-7AM, please contact night-coverage [email or web address]  07/10/2019, 9:45 AM  Patient ID: [name], female   DOB: September 30, 1950, 69 y.o.   MRN: [id number]

## 2019-07-10 NOTE — Progress Notes (Signed)
Ref: [name]., FNP   Subjective:  Awake. No chest pain. Aware of renal dysfunction and elevated uric acid level but denies gout. Awaiting TEE in AM.  Objective:  Vital Signs in the last 24 hours: Temp:  [98 F (36.7 C)-98.4 F (36.9 C)] 98.1 F (36.7 C) (01/24 1830) Pulse Rate:  [73-80] 76 (01/24 1830) Resp:  [15-18] 18 (01/24 1830) BP: (119-135)/(61-83) 128/68 (01/24 1830) SpO2:  [97 %-98 %] 98 % (01/24 1830)  Physical Exam: BP Readings from Last 1 Encounters:  07/10/19 128/68     Wt Readings from Last 1 Encounters:  07/06/19 93.2 kg    Weight change:  Body mass index is 35.27 kg/m. HEENT: [place]/AT, Eyes-Brown, PERL, EOMI, Conjunctiva-Pink, Sclera-Non-icteric Neck: No JVD, No bruit, Trachea midline. Lungs:  Clear, Bilateral. Cardiac:  Regular rhythm, normal S1 and S2, no S3. II/VI systolic murmur. Abdomen:  Soft, non-tender. BS present. Extremities:  No edema present. No cyanosis. No clubbing. CNS: AxOx3, Cranial nerves grossly intact, moves all 4 extremities with mild weakness of lower legs.  Skin: Warm and dry.   Intake/Output from previous day: 01/23 0701 - 01/24 0700 In: 720 [P.O.:720] Out: 50 [Urine:50]    Lab Results: BMET    Component Value Date/Time   NA 140 07/10/2019 0225   NA 139 07/09/2019 0458   NA 138 07/08/2019 0658   K 3.8 07/10/2019 0225   K 3.7 07/09/2019 0458   K 3.8 07/08/2019 0658   CL 111 07/10/2019 0225   CL 108 07/09/2019 0458   CL 109 07/08/2019 0658   CO2 20 (L) 07/10/2019 0225   CO2 20 (L) 07/09/2019 0458   CO2 18 (L) 07/08/2019 0658   GLUCOSE 227 (H) 07/10/2019 0225   GLUCOSE 198 (H) 07/09/2019 0458   GLUCOSE 253 (H) 07/08/2019 0658   BUN 16 07/10/2019 0225   BUN 20 07/09/2019 0458   BUN 34 (H) 07/08/2019 0658   CREATININE 1.25 (H) 07/10/2019 0225   CREATININE 1.74 (H) 07/09/2019 0458   CREATININE 2.62 (H) 07/08/2019 0658   CALCIUM 7.9 (L) 07/10/2019 0225   CALCIUM 7.9 (L) 07/09/2019 0458   CALCIUM 8.1 (L)  07/08/2019 0658   GFRNONAA 45 (L) 07/10/2019 0225   GFRNONAA 30 (L) 07/09/2019 0458   GFRNONAA 18 (L) 07/08/2019 0658   GFRAA 52 (L) 07/10/2019 0225   GFRAA 35 (L) 07/09/2019 0458   GFRAA 21 (L) 07/08/2019 0658   CBC    Component Value Date/Time   WBC 4.1 07/10/2019 0225   RBC 3.75 (L) 07/10/2019 0225   HGB 8.3 (L) 07/10/2019 0225   HCT 28.9 (L) 07/10/2019 0225   PLT 95 (L) 07/10/2019 0225   MCV 77.1 (L) 07/10/2019 0225   MCH 22.1 (L) 07/10/2019 0225   MCHC 28.7 (L) 07/10/2019 0225   RDW 19.2 (H) 07/10/2019 0225   LYMPHSABS 2.3 07/08/2019 0658   MONOABS 0.7 07/08/2019 0658   EOSABS 1.0 (H) 07/08/2019 0658   BASOSABS 0.1 07/08/2019 0658   HEPATIC Function Panel Recent Labs    07/07/19 0750 07/08/19 0658 07/10/19 0225  PROT 6.8 7.1 5.7*   HEMOGLOBIN A1C No components found for: HGA1C,  MPG CARDIAC ENZYMES No results found for: CKTOTAL, CKMB, CKMBINDEX, TROPONINI BNP No results for input(s): PROBNP in the last 8760 hours. TSH No results for input(s): TSH in the last 8760 hours. CHOLESTEROL Recent Labs    07/08/19 0658  CHOL 91    Scheduled Meds: . allopurinol  100 mg Oral Daily  .  amLODipine  10 mg Oral Daily  . vitamin C  250 mg Oral Q breakfast  . aspirin EC  81 mg Oral Daily  . atorvastatin  20 mg Oral Daily  . clopidogrel  75 mg Oral Daily  . cyclobenzaprine  5 mg Oral QHS  . DULoxetine  20 mg Oral Daily  . ferrous sulfate  325 mg Oral Q breakfast  . heparin  5,000 Units Subcutaneous Q8H  . insulin aspart  0-9 Units Subcutaneous TID WC  . insulin glargine  20 Units Subcutaneous QHS  . mometasone-formoterol  2 puff Inhalation BID  . pantoprazole  40 mg Oral Daily  . pregabalin  200 mg Oral TID  . topiramate  100 mg Oral Daily   Continuous Infusions: . sodium chloride 100 mL/hr at 07/10/19 1803   PRN Meds:.acetaminophen **OR** acetaminophen, albuterol, hydrALAZINE, ondansetron **OR** ondansetron (ZOFRAN) IV  Assessment/Plan: Acute embolic left  occipital infarct Dehydration Acute on chronic renal failure, improving Type 2 DM Diabetic peripheral neuropathy Obesity HTN OSA Headaches Arthritis GERD Iron deficiency anemia Hyperuricemia  Start iron supplement with vitamin C to augment absorption. Small dose allopurinol Discussed diet and activity. TEE in AM.   LOS: 3 days   Time spent including chart review, lab review, examination, discussion with patient : 30 min   Dixie Dials  MD  07/10/2019, 8:10 PM

## 2019-07-11 ENCOUNTER — Encounter (HOSPITAL_COMMUNITY): Payer: Self-pay | Admitting: Internal Medicine

## 2019-07-11 ENCOUNTER — Telehealth: Payer: Self-pay | Admitting: *Deleted

## 2019-07-11 ENCOUNTER — Inpatient Hospital Stay (HOSPITAL_COMMUNITY): Payer: Medicare Other

## 2019-07-11 ENCOUNTER — Encounter (HOSPITAL_COMMUNITY)
Admission: EM | Disposition: A | Discharge status: Home / Self Care | Payer: Self-pay | Source: Home / Self Care | Attending: Internal Medicine

## 2019-07-11 HISTORY — PX: BUBBLE STUDY: SHX6837

## 2019-07-11 HISTORY — PX: TEE WITHOUT CARDIOVERSION: SHX5443

## 2019-07-11 LAB — GLUCOSE, CAPILLARY
Glucose-Capillary: 161 mg/dL — ABNORMAL HIGH (ref 70–99)
Glucose-Capillary: 149 mg/dL — ABNORMAL HIGH (ref 70–99)
Glucose-Capillary: 159 mg/dL — ABNORMAL HIGH (ref 70–99)
Glucose-Capillary: 248 mg/dL — ABNORMAL HIGH (ref 70–99)

## 2019-07-11 SURGERY — ECHOCARDIOGRAM, TRANSESOPHAGEAL
Anesthesia: Moderate Sedation

## 2019-07-11 MED ORDER — DIPHENHYDRAMINE HCL 50 MG/ML IJ SOLN
INTRAMUSCULAR | Status: AC
  Filled 2019-07-11: qty 1

## 2019-07-11 MED ORDER — FENTANYL CITRATE (PF) 100 MCG/2ML IJ SOLN
INTRAMUSCULAR | Status: DC | PRN
  Administered 2019-07-11 (×2): 25 ug via INTRAVENOUS

## 2019-07-11 MED ORDER — MIDAZOLAM HCL (PF) 5 MG/ML IJ SOLN
INTRAMUSCULAR | Status: AC
  Filled 2019-07-11: qty 2

## 2019-07-11 MED ORDER — BUTAMBEN-TETRACAINE-BENZOCAINE 2-2-14 % EX AERO
INHALATION_SPRAY | CUTANEOUS | Status: DC | PRN
  Administered 2019-07-11: 08:00:00 2 via TOPICAL

## 2019-07-11 MED ORDER — MIDAZOLAM HCL (PF) 10 MG/2ML IJ SOLN
INTRAMUSCULAR | Status: DC | PRN
  Administered 2019-07-11: 1 mg via INTRAVENOUS
  Administered 2019-07-11: 2 mg via INTRAVENOUS

## 2019-07-11 MED ORDER — SODIUM CHLORIDE 0.9 % IV SOLN: INTRAVENOUS | Status: DC

## 2019-07-11 MED ORDER — FENTANYL CITRATE (PF) 100 MCG/2ML IJ SOLN
INTRAMUSCULAR | Status: AC
  Filled 2019-07-11: qty 2

## 2019-07-11 NOTE — Progress Notes (Signed)
PROGRESS NOTE    [name]  NTI:[id number] DOB: 02-Mar-1951 DOA: 07/06/2019 PCP: [name], FNP   Brief Narrative:  HPI per Dr. Hal Hope: Whitney Forrey Poindexteris a 69 y.o.femalewithhistory of diabetes mellitus type 2, hypertension was brought to the ER the patient has been feeling weak and passing out. As per the patient's grandson who lives with the patient over the last 1 week patient has been passing out whenever she tries to ambulate. Also has been having some diarrhea last few days. Has not had any vomiting or chest pain fever chills or shortness of breath. Recently she had new medication Trulicity added to her diabetic medications. Otherwise her blood pressure medicines and other medications with same. Primary care physician was contacted and was instructed to bring to the ER. Patient did complain of some abdominal epigastric discomfort to the ER physician.  ED Course:In the ER patient was hypotensive with blood pressure in the 86P systolic was given fluid bolus following which blood pressure improved. Labs show significantly increased creatinine of 5.6 from normal last October 2020. LFTs were largely unremarkable. Albumin was 2.7. CBC shows hemoglobin of 10.9 at baseline with mild leukocytosis. Urine shows started bacteria hyaline casts nitrites with negative leukocyte esterase trace no WBCs. Patient had CT head which was unremarkable CT abdomen shows features concerning for cirrhosis. Patient was given fluid bolus admitted for acute renal failure with dehydration and syncopal episode. EKG shows normal sinus rhythm. On my exam patient appears mildly confused  1/25: TEE performed today with results pending. No new concerns noted at this time. Recheck AM labs and hold further IVF for now.  Assessment & Plan:   Principal Problem:   ARF (acute renal failure) (HCC) Active Problems:   OSA (obstructive sleep apnea)   Benign essential HTN   Diabetes  mellitus with nephropathy (HCC)   Anemia associated with chronic renal failure   Cerebral embolism with cerebral infarction   1. Acute kidney injury.  Likely precipitated from recent diarrhea and ACE inhibitor use.  Creatinine proving, continue IV fluids, no hx of CKD.  CT abdomen did not show any hydronephrosis.  CK levels were unremarkable. Recheck am labs. Hold further IVF. 2. Patchy left occipital infarct, embolic, secondary to unknown etiology concern for PAF. neurology following, recommend aspirin and Plavix for 3 weeks, followed by aspirin alone.  Echocardiogram was unrevealing.  Neurology has recommended aggressive risk factor modifications.  Continue on statin.  Cardiology consulted about loop/event recorder and TEE due to concern for possible PAF causing embolic stroke. S/p TEE today with results pending. 3. Syncopal episode.  Likely related to hypotension/dehydration on admission.  No further episodes since admission.  See #1 4. Diabetes.  Continue on Lantus and supplement with sliding scale insulin. 5. Hyperlipidemia.  Continue statin  DVT prophylaxis: Heparin Code Status: Full code Family Communication: None at bedside Disposition Plan: TEE results pending.   Consultants:   Neurology  Cardiology  Procedures:  Echo:1. Left ventricular ejection fraction, by visual estimation, is 65 to 70%. The left ventricle has hyperdynamic function. There is no left ventricular hypertrophy. 2. Left ventricular diastolic parameters are consistent with Grade I diastolic dysfunction (impaired relaxation). 3. The left ventricle has no regional wall motion abnormalities. 4. Global right ventricle has normal systolic function.The right ventricular size is normal. No increase in right ventricular wall thickness. 5. Left atrial size was normal. 6. Right atrial size was normal. 7. The mitral valve is normal in structure. No evidence of mitral valve  regurgitation. No evidence of mitral  stenosis. 8. The tricuspid valve is normal in structure. Tricuspid valve regurgitation is not demonstrated. 9. The aortic valve is tricuspid. Mild aortic valve stenosis. Mean gradient 11 mmHg. 10. The inferior vena cava is normal in size with greater than 50% respiratory variability, suggesting right atrial pressure of 3 mmHg.  11. TR signal is inadequate for assessing pulmonary artery systolic pressure.  Antimicrobials:   None  Subjective: Patient seen and evaluated today with no new acute complaints or concerns. No acute concerns or events noted overnight.  Objective: Vitals:   07/11/19 0840 07/11/19 0850 07/11/19 0901 07/11/19 1221  BP: 136/67  (!) 156/73 (!) 144/80  Pulse: 79 72 84 71  Resp: 16 12 16 16   Temp:   98 F (36.7 C) 97.8 F (36.6 C)  TempSrc:   Oral Oral  SpO2: 97% 98% 97% 99%  Weight:      Height:        Intake/Output Summary (Last 24 hours) at 07/11/2019 1427 Last data filed at 07/11/2019 0600 Gross per 24 hour  Intake 6083.85 ml  Output --  Net 6083.85 ml   Filed Weights   07/06/19 2345  Weight: 93.2 kg    Examination:  General exam: Appears calm and comfortable  Respiratory system: Clear to auscultation. Respiratory effort normal. Cardiovascular system: S1 & S2 heard, RRR. No JVD, murmurs, rubs, gallops or clicks. No pedal edema. Gastrointestinal system: Abdomen is nondistended, soft and nontender. No organomegaly or masses felt. Normal bowel sounds heard. Central nervous system: Alert and oriented. No focal neurological deficits. Extremities: Symmetric 5 x 5 power. Skin: No rashes, lesions or ulcers Psychiatry: Judgement and insight appear normal. Mood & affect appropriate.     Data Reviewed: I have personally reviewed following labs and imaging studies  CBC: Recent Labs  Lab 07/05/19 1251 07/05/19 1251 07/06/19 2247 07/06/19 2303 07/07/19 0535 07/08/19 0658 07/10/19 0225  WBC 10.8*  --  7.6  --  8.2 6.5 4.1  NEUTROABS  --   --   3.5  --   --  2.4  --   HGB 10.9*   < > 9.1* 9.9* 8.7* 9.7* 8.3*  HCT 39.0   < > 31.9* 29.0* 31.2* 32.8* 28.9*  MCV 77.2*  --  79.6*  --  78.2* 74.7* 77.1*  PLT 246  --  162  --  149* 154 95*   < > = values in this interval not displayed.   Basic Metabolic Panel: Recent Labs  Lab 07/06/19 2247 07/06/19 2247 07/06/19 2303 07/07/19 0535 07/07/19 0750 07/08/19 0658 07/09/19 0458 07/10/19 0225  NA 135   < > 137  --  137 138 139 140  K 4.5   < > 4.4  --  4.3 3.8 3.7 3.8  CL 106  --   --   --  105 109 108 111  CO2 14*  --   --   --  18* 18* 20* 20*  GLUCOSE 323*  --   --   --  206* 253* 198* 227*  BUN 62*  --   --   --  50* 34* 20 16  CREATININE 5.22*   < >  --  3.89* 4.17* 2.62* 1.74* 1.25*  CALCIUM 7.6*  --   --   --  8.2* 8.1* 7.9* 7.9*  MG  --   --   --   --   --  1.4*  --   --   PHOS  --   --   --   --   --  3.0  --   --    < > = values in this interval not displayed.   GFR: Estimated Creatinine Clearance: 49 mL/min (A) (by C-G formula based on SCr of 1.25 mg/dL (H)). Liver Function Tests: Recent Labs  Lab 07/06/19 2247 07/07/19 0750 07/08/19 0658 07/10/19 0225  AST 34 32 29 17  ALT 21 26 27 22   ALKPHOS 61 72 99 75  BILITOT 0.7 0.4 0.8 0.9  PROT 5.8* 6.8 7.1 5.7*  ALBUMIN 2.7* 3.2* 3.3* 2.7*   No results for input(s): LIPASE, AMYLASE in the last 168 hours. Recent Labs  Lab 07/06/19 2250  AMMONIA 20   Coagulation Profile: No results for input(s): INR, PROTIME in the last 168 hours. Cardiac Enzymes: No results for input(s): CKTOTAL, CKMB, CKMBINDEX, TROPONINI in the last 168 hours. BNP (last 3 results) No results for input(s): PROBNP in the last 8760 hours. HbA1C: No results for input(s): HGBA1C in the last 72 hours. CBG: Recent Labs  Lab 07/10/19 1113 07/10/19 1549 07/10/19 2007 07/11/19 0544 07/11/19 1107  GLUCAP 203* 231* 193* 161* 149*   Lipid Profile: No results for input(s): CHOL, HDL, LDLCALC, TRIG, CHOLHDL, LDLDIRECT in the last 72  hours. Thyroid Function Tests: No results for input(s): TSH, T4TOTAL, FREET4, T3FREE, THYROIDAB in the last 72 hours. Anemia Panel: Recent Labs    07/09/19 1330  FERRITIN 24  TIBC 392  IRON 30   Sepsis Labs: Recent Labs  Lab 07/06/19 2247 07/07/19 0159 07/07/19 0750 07/07/19 0916  LATICACIDVEN 6.4* 4.4* 3.7* 3.9*    Recent Results (from the past 240 hour(s))  Respiratory Panel by RT PCR (Flu A&B, Covid) - Nasopharyngeal Swab     Status: None   Collection Time: 07/06/19 10:50 PM   Specimen: Nasopharyngeal Swab  Result Value Ref Range Status   SARS Coronavirus 2 by RT PCR NEGATIVE NEGATIVE Final    Comment: (NOTE) SARS-CoV-2 target nucleic acids are NOT DETECTED. The SARS-CoV-2 RNA is generally detectable in upper respiratoy specimens during the acute phase of infection. The lowest concentration of SARS-CoV-2 viral copies this assay can detect is 131 copies/mL. A negative result does not preclude SARS-Cov-2 infection and should not be used as the sole basis for treatment or other patient management decisions. A negative result may occur with  improper specimen collection/handling, submission of specimen other than nasopharyngeal swab, presence of viral mutation(s) within the areas targeted by this assay, and inadequate number of viral copies (<131 copies/mL). A negative result must be combined with clinical observations, patient history, and epidemiological information. The expected result is Negative. Fact Sheet for Patients:  PinkCheek.be Fact Sheet for Healthcare Providers:  GravelBags.it This test is not yet ap proved or cleared by the [place] FDA and  has been authorized for detection and/or diagnosis of SARS-CoV-2 by FDA under an Emergency Use Authorization (EUA). This EUA will remain  in effect (meaning this test can be used) for the duration of the COVID-19 declaration under Section 564(b)(1) of the  Act, 21 U.S.C. section 360bbb-3(b)(1), unless the authorization is terminated or revoked sooner.    Influenza A by PCR NEGATIVE NEGATIVE Final   Influenza B by PCR NEGATIVE NEGATIVE Final    Comment: (NOTE) The Xpert Xpress SARS-CoV-2/FLU/RSV assay is intended as an aid in  the diagnosis of influenza from Nasopharyngeal swab specimens and  should not be used as a sole basis for treatment. Nasal washings and  aspirates are unacceptable for Xpert Xpress SARS-CoV-2/FLU/RSV  testing. Fact Sheet for Patients:  PinkCheek.be Fact Sheet for Healthcare Providers: GravelBags.it This test is not yet approved or cleared by the [place] FDA and  has been authorized for detection and/or diagnosis of SARS-CoV-2 by  FDA under an Emergency Use Authorization (EUA). This EUA will remain  in effect (meaning this test can be used) for the duration of the  Covid-19 declaration under Section 564(b)(1) of the Act, 21  U.S.C. section 360bbb-3(b)(1), unless the authorization is  terminated or revoked. Performed at [facility], [place] [address] [place], [place] [address]   Culture, blood (Routine X 2) w Reflex to ID Panel     Status: None (Preliminary result)   Collection Time: 07/08/19  7:35 AM   Specimen: BLOOD LEFT HAND  Result Value Ref Range Status   Specimen Description BLOOD LEFT HAND  Final   Special Requests   Final    BOTTLES DRAWN AEROBIC AND ANAEROBIC Blood Culture results may not be optimal due to an inadequate volume of blood received in culture bottles   Culture   Final    NO GROWTH 3 DAYS Performed at [facility], [place] [address], [place], [place] [address]    Report Status PENDING  Incomplete  Culture, blood (Routine X 2) w Reflex to ID Panel     Status: None (Preliminary result)   Collection Time: 07/08/19  7:47 AM   Specimen: BLOOD RIGHT HAND  Result Value Ref Range Status   Specimen Description BLOOD RIGHT  HAND  Final   Special Requests   Final    BOTTLES DRAWN AEROBIC ONLY Blood Culture results may not be optimal due to an inadequate volume of blood received in culture bottles   Culture   Final    NO GROWTH 3 DAYS Performed at [facility], [place] [address], [place],  [address]    Report Status PENDING  Incomplete         Radiology Studies: No results found.      Scheduled Meds: . allopurinol  100 mg Oral Daily  . amLODipine  10 mg Oral Daily  . vitamin C  250 mg Oral Q breakfast  . aspirin EC  81 mg Oral Daily  . atorvastatin  20 mg Oral Daily  . clopidogrel  75 mg Oral Daily  . cyclobenzaprine  5 mg Oral QHS  . DULoxetine  20 mg Oral Daily  . ferrous sulfate  325 mg Oral Q breakfast  . heparin  5,000 Units Subcutaneous Q8H  . insulin aspart  0-9 Units Subcutaneous TID WC  . insulin glargine  20 Units Subcutaneous QHS  . mometasone-formoterol  2 puff Inhalation BID  . pantoprazole  40 mg Oral Daily  . pregabalin  200 mg Oral TID  . topiramate  100 mg Oral Daily   Continuous Infusions: . sodium chloride 100 mL/hr at 07/11/19 0920     LOS: 4 days    Time spent: 30 minutes    Hikari Tripp Darleen Crocker, DO [facility] Pager [phone]  If 7PM-7AM, please contact night-coverage [email or web address] Password [facility] 07/11/2019, 2:27 PM

## 2019-07-11 NOTE — Progress Notes (Signed)
  Echocardiogram Echocardiogram Transesophageal has been performed.  Adeeb Konecny A Lavell Ridings 07/11/2019, 11:17 AM

## 2019-07-11 NOTE — Telephone Encounter (Signed)
Patient enrolled for [employer] to ship a 30 day Cardiac event monitor to her home.  Instructions will be included in the monitor kit.

## 2019-07-11 NOTE — Consult Note (Signed)
Ref: [name], FNP   Subjective:  Awake. TEE showed Normal LV systolic function. Mild MR and TR. PFO identified by sonicated saline injection. She also had moderate mobile plaque in ascending aorta.  No clot in LV, LA or LA appendage. VS stable. Monitor-sinus rhythm.  Objective:  Vital Signs in the last 24 hours: Temp:  [97.3 F (36.3 C)-98 F (36.7 C)] 98 F (36.7 C) (01/25 1807) Pulse Rate:  [71-84] 72 (01/25 1807) Cardiac Rhythm: Normal sinus rhythm;Heart block (01/25 1900) Resp:  [9-22] 16 (01/25 1807) BP: (129-157)/(52-87) 134/69 (01/25 1807) SpO2:  [97 %-100 %] 100 % (01/25 1807)  Physical Exam: BP Readings from Last 1 Encounters:  07/11/19 134/69     Wt Readings from Last 1 Encounters:  07/06/19 93.2 kg    Weight change:  Body mass index is 35.27 kg/m. HEENT: [place]/AT, Eyes-Brown, PERL, EOMI, Conjunctiva-Pale, Sclera-Non-icteric Neck: No JVD, No bruit, Trachea midline. Lungs:  Clear, Bilateral. Cardiac:  Regular rhythm, normal S1 and S2, no S3. II/VI systolic murmur. Abdomen:  Soft, non-tender. BS present. Extremities:  No edema present. No cyanosis. No clubbing. CNS: AxOx3, Cranial nerves grossly intact, moves all 4 extremities. Lower extremities weakness persist. Skin: Warm and dry.   Intake/Output from previous day: 01/24 0701 - 01/25 0700 In: 6563.9 [P.O.:720; I.V.:5843.9] Out: -     Lab Results: BMET    Component Value Date/Time   NA 140 07/10/2019 0225   NA 139 07/09/2019 0458   NA 138 07/08/2019 0658   K 3.8 07/10/2019 0225   K 3.7 07/09/2019 0458   K 3.8 07/08/2019 0658   CL 111 07/10/2019 0225   CL 108 07/09/2019 0458   CL 109 07/08/2019 0658   CO2 20 (L) 07/10/2019 0225   CO2 20 (L) 07/09/2019 0458   CO2 18 (L) 07/08/2019 0658   GLUCOSE 227 (H) 07/10/2019 0225   GLUCOSE 198 (H) 07/09/2019 0458   GLUCOSE 253 (H) 07/08/2019 0658   BUN 16 07/10/2019 0225   BUN 20 07/09/2019 0458   BUN 34 (H) 07/08/2019 0658   CREATININE 1.25 (H)  07/10/2019 0225   CREATININE 1.74 (H) 07/09/2019 0458   CREATININE 2.62 (H) 07/08/2019 0658   CALCIUM 7.9 (L) 07/10/2019 0225   CALCIUM 7.9 (L) 07/09/2019 0458   CALCIUM 8.1 (L) 07/08/2019 0658   GFRNONAA 45 (L) 07/10/2019 0225   GFRNONAA 30 (L) 07/09/2019 0458   GFRNONAA 18 (L) 07/08/2019 0658   GFRAA 52 (L) 07/10/2019 0225   GFRAA 35 (L) 07/09/2019 0458   GFRAA 21 (L) 07/08/2019 0658   CBC    Component Value Date/Time   WBC 4.1 07/10/2019 0225   RBC 3.75 (L) 07/10/2019 0225   HGB 8.3 (L) 07/10/2019 0225   HCT 28.9 (L) 07/10/2019 0225   PLT 95 (L) 07/10/2019 0225   MCV 77.1 (L) 07/10/2019 0225   MCH 22.1 (L) 07/10/2019 0225   MCHC 28.7 (L) 07/10/2019 0225   RDW 19.2 (H) 07/10/2019 0225   LYMPHSABS 2.3 07/08/2019 0658   MONOABS 0.7 07/08/2019 0658   EOSABS 1.0 (H) 07/08/2019 0658   BASOSABS 0.1 07/08/2019 0658   HEPATIC Function Panel Recent Labs    07/07/19 0750 07/08/19 0658 07/10/19 0225  PROT 6.8 7.1 5.7*   HEMOGLOBIN A1C No components found for: HGA1C,  MPG CARDIAC ENZYMES No results found for: CKTOTAL, CKMB, CKMBINDEX, TROPONINI BNP No results for input(s): PROBNP in the last 8760 hours. TSH No results for input(s): TSH in the last 8760  hours. CHOLESTEROL Recent Labs    07/08/19 0658  CHOL 91    Scheduled Meds: . allopurinol  100 mg Oral Daily  . amLODipine  10 mg Oral Daily  . vitamin C  250 mg Oral Q breakfast  . aspirin EC  81 mg Oral Daily  . atorvastatin  20 mg Oral Daily  . clopidogrel  75 mg Oral Daily  . cyclobenzaprine  5 mg Oral QHS  . DULoxetine  20 mg Oral Daily  . ferrous sulfate  325 mg Oral Q breakfast  . heparin  5,000 Units Subcutaneous Q8H  . insulin aspart  0-9 Units Subcutaneous TID WC  . insulin glargine  20 Units Subcutaneous QHS  . mometasone-formoterol  2 puff Inhalation BID  . pantoprazole  40 mg Oral Daily  . pregabalin  200 mg Oral TID  . topiramate  100 mg Oral Daily   Continuous Infusions: PRN  Meds:.acetaminophen **OR** acetaminophen, albuterol, hydrALAZINE, ondansetron **OR** ondansetron (ZOFRAN) IV  Assessment/Plan: Acute embolic left occipital infarct Type 2 DM Acute on chronic renal failure Mild MR and TR Obesity HTN OSA Headaches Arthritis Iron deficiency anemia Hyperuricemia  Continue aspirin, Plavix and atorvastatin. Increase atorvastatin dose to 40 mg. daily.   LOS: 4 days   Time spent including chart review, lab review, examination, discussion with patient, echo-tech and nurse : 30 min   Dixie Dials  MD  07/11/2019, 8:12 PM

## 2019-07-11 NOTE — Progress Notes (Signed)
Physical Therapy Treatment Patient Details Name: [name] MRN: [id number] DOB: 02/23/1951 Today's Date: 07/11/2019    History of Present Illness Pt is a 69 y.o. female admitted 07/06/19 with weakness and "passing out whenever she tries to walk" per grandson. Pt found to have hypotension, AKI with dehydration. 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 PMH includes DM2, HTN.    PT Comments    Pt progressing towards physical therapy goals. She was able to perform transfers and ambulation with gross min guard assist to supervision for safety - mod I for bed mobility. Recommend continued RW use initially as pt is at a lower fall risk with RW than without. She will have assist from family at home upon d/c. Will continue to follow.    Follow Up Recommendations  No PT follow up;Supervision for mobility/OOB     Equipment Recommendations  None recommended by PT    Recommendations for Other Services       Precautions / Restrictions Precautions Precautions: Fall Restrictions Weight Bearing Restrictions: No    Mobility  Bed Mobility Overal bed mobility: Modified Independent             General bed mobility comments: HOB elevated, increased time  Transfers Overall transfer level: Needs assistance Equipment used: None;Rolling walker (2 wheeled) Transfers: Sit to/from Stand Sit to Stand: Supervision         General transfer comment: No assist required. Increased time without RW but no overt LOB noted without AD.   Ambulation/Gait Ambulation/Gait assistance: Min guard Gait Distance (Feet): 250 Feet Assistive device: None;Rolling walker (2 wheeled) Gait Pattern/deviations: Step-through pattern;Decreased stride length Gait velocity: Decreased Gait velocity interpretation: 1.31 - 2.62 ft/sec, indicative of limited community ambulator General Gait Details: Pt ambulating in room without RW, very guarded and reaching out for external support. For hallway  ambulation, pt was able to improve speed and overall steadiness. Pt able to casually ambulate and talk about family/great-grandchildren without any noted dyspnea.    Stairs             Wheelchair Mobility    Modified Rankin (Stroke Patients Only) Modified Rankin (Stroke Patients Only) Pre-Morbid Rankin Score: No symptoms Modified Rankin: No symptoms     Balance Overall balance assessment: Needs assistance Sitting-balance support: No upper extremity supported;Feet supported Sitting balance-Leahy Scale: Good       Standing balance-Leahy Scale: Fair                              Cognition Arousal/Alertness: Awake/alert Behavior During Therapy: WFL for tasks assessed/performed Overall Cognitive Status: Within Functional Limits for tasks assessed                                        Exercises      General Comments        Pertinent Vitals/Pain Pain Assessment: Faces Faces Pain Scale: No hurt Pain Intervention(s): Limited activity within patient's tolerance;Monitored during session;Repositioned    Home Living                      Prior Function            PT Goals (current goals can now be found in the care plan section) Acute Rehab PT Goals Patient Stated Goal: to go home PT Goal Formulation: With patient Time  For Goal Achievement: 07/22/19 Potential to Achieve Goals: Good Progress towards PT goals: Progressing toward goals    Frequency    Min 3X/week      PT Plan Current plan remains appropriate    Co-evaluation              AM-PAC PT "6 Clicks" Mobility   Outcome Measure  Help needed turning from your back to your side while in a flat bed without using bedrails?: None Help needed moving from lying on your back to sitting on the side of a flat bed without using bedrails?: None Help needed moving to and from a bed to a chair (including a wheelchair)?: None Help needed standing up from a chair using  your arms (e.g., wheelchair or bedside chair)?: None Help needed to walk in hospital room?: A Little Help needed climbing 3-5 steps with a railing? : A Little 6 Click Score: 22    End of Session Equipment Utilized During Treatment: Gait belt Activity Tolerance: Patient tolerated treatment well Patient left: in bed;with call bell/phone within reach;with bed alarm set Nurse Communication: Mobility status PT Visit Diagnosis: Other abnormalities of gait and mobility (R26.89)     Time: 6580-0634 PT Time Calculation (min) (ACUTE ONLY): 24 min  Charges:  $Gait Training: 23-37 mins                     [name], PT, DPT Acute Rehabilitation Services Pager: [phone] Office: [phone]    Thelma Comp 07/11/2019, 2:58 PM

## 2019-07-12 LAB — BASIC METABOLIC PANEL
Anion gap: 8 (ref 5–15)
BUN: 12 mg/dL (ref 8–23)
CO2: 23 mmol/L (ref 22–32)
Calcium: 8.3 mg/dL — ABNORMAL LOW (ref 8.9–10.3)
Chloride: 114 mmol/L — ABNORMAL HIGH (ref 98–111)
Creatinine, Ser: 1.11 mg/dL — ABNORMAL HIGH (ref 0.44–1.00)
GFR calc Af Amer: 60 mL/min — ABNORMAL LOW (ref >60)
GFR calc non Af Amer: 52 mL/min — ABNORMAL LOW (ref >60)
Glucose, Bld: 122 mg/dL — ABNORMAL HIGH (ref 70–99)
Potassium: 4.3 mmol/L (ref 3.5–5.1)
Sodium: 145 mmol/L (ref 135–145)

## 2019-07-12 LAB — CBC
HCT: 30.6 % — ABNORMAL LOW (ref 36.0–46.0)
Hemoglobin: 8.7 g/dL — ABNORMAL LOW (ref 12.0–15.0)
MCH: 22.3 pg — ABNORMAL LOW (ref 26.0–34.0)
MCHC: 28.4 g/dL — ABNORMAL LOW (ref 30.0–36.0)
MCV: 78.3 fL — ABNORMAL LOW (ref 80.0–100.0)
Platelets: 88 10*3/uL — ABNORMAL LOW (ref 150–400)
RBC: 3.91 MIL/uL (ref 3.87–5.11)
RDW: 19.9 % — ABNORMAL HIGH (ref 11.5–15.5)
WBC: 4.9 10*3/uL (ref 4.0–10.5)
nRBC: 0.4 % — ABNORMAL HIGH (ref 0.0–0.2)

## 2019-07-12 LAB — GLUCOSE, CAPILLARY
Glucose-Capillary: 89 mg/dL (ref 70–99)
Glucose-Capillary: 173 mg/dL — ABNORMAL HIGH (ref 70–99)

## 2019-07-12 MED ORDER — ASCORBIC ACID 250 MG PO TABS: 250.0000 mg | ORAL_TABLET | Freq: Every day | ORAL | 0 refills | Status: AC

## 2019-07-12 MED ORDER — ASPIRIN 81 MG PO TBEC: 81.0000 mg | DELAYED_RELEASE_TABLET | Freq: Every day | ORAL | 3 refills | Status: AC

## 2019-07-12 MED ORDER — ALLOPURINOL 100 MG PO TABS: 100.0000 mg | ORAL_TABLET | Freq: Every day | ORAL | 0 refills | Status: DC

## 2019-07-12 MED ORDER — CLOPIDOGREL BISULFATE 75 MG PO TABS: 75.0000 mg | ORAL_TABLET | Freq: Every day | ORAL | 0 refills | Status: AC

## 2019-07-12 MED ORDER — FERROUS SULFATE 325 (65 FE) MG PO TABS: 325.0000 mg | ORAL_TABLET | Freq: Every day | ORAL | 0 refills | Status: AC

## 2019-07-12 NOTE — Progress Notes (Signed)
Occupational Therapy Treatment Patient Details Name: [name] MRN: [id number] DOB: May 22, 1951 Today's Date: 07/12/2019    History of present illness Pt is a 69 y.o. female admitted 07/06/19 with weakness and "passing out whenever she tries to walk" per grandson. Pt found to have hypotension, AKI with dehydration. 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 PMH includes DM2, HTN.   OT comments  Pt progressing towards OT goals this session, no LOB or dizziness with functional ADL in room. Pt does require mod cues for safety with RW - but verbalizes that she will use at home and can verbalize that it helps her with dynamic balance. Pt supervision for toilet transfer, peri care, LB dressing, sink level grooming. Pt eager to go home today. Pt was asking me questions about the procedure that was done to her heart - and so explained what an EKG was and deferred results and further procedures to the RN. Suspect low health literacy and Pt/family desire to understand what is going on. I also followed up with Pt and case management about shower safety and with frequent falls - importance of sitting for safety during bathing. I would prefer that she get a tub bench - but depending on what insurance will cover and family can afford education provided for using 3 in 1 as shower chair (but tub bench would be [facility] safer).    Follow Up Recommendations  No OT follow up;Supervision - Intermittent    Equipment Recommendations  Tub/shower bench;3 in 1 bedside commode(prefer tub bench, but 3 in 1 as shower chair works)    Recommendations for Other Services      Precautions / Restrictions Precautions Precautions: Fall Restrictions Weight Bearing Restrictions: No       Mobility Bed Mobility Overal bed mobility: Modified Independent                Transfers Overall transfer level: Needs assistance Equipment used: None;Rolling walker (2 wheeled) Transfers: Sit to/from  Stand Sit to Stand: Supervision         General transfer comment: vc for safety with RW    Balance Overall balance assessment: Needs assistance Sitting-balance support: Feet supported;No upper extremity supported Sitting balance-Leahy Scale: Good     Standing balance support: Bilateral upper extremity supported;Single extremity supported;During functional activity;No upper extremity supported Standing balance-Leahy Scale: Fair Standing balance comment: dynamic balance improved with BUE support                           ADL either performed or assessed with clinical judgement   ADL Overall ADL's : Needs assistance/impaired     Grooming: Supervision/safety;Oral care;Wash/dry face;Wash/dry hands;Standing Grooming Details (indicate cue type and reason): sink level, no LOB or dizzness/wooziness     Lower Body Bathing: Supervison/ safety;Sit to/from stand Lower Body Bathing Details (indicate cue type and reason): discussed safety in the shower and for safety how she really needs to sit     Lower Body Dressing: Supervision/safety;Sit to/from stand   Toilet Transfer: Supervision/safety;Cueing for safety;Regular Toilet;RW;Ambulation [employer] Details (indicate cue type and reason): vc for RW management Toileting- Clothing Manipulation and Hygiene: Supervision/safety;Sit to/from stand Toileting - Clothing Manipulation Details (indicate cue type and reason): able to manage peri care and underwear without LOB     Functional mobility during ADLs: Supervision/safety;Rolling walker;Cueing for safety General ADL Comments: mild safety deficits noted with functional mobility and transfers during self care tasks     Vision  Vision Assessment?: No apparent visual deficits   Perception     Praxis      Cognition Arousal/Alertness: Awake/alert Behavior During Therapy: WFL for tasks assessed/performed Overall Cognitive Status: No family/caregiver present to determine  baseline cognitive functioning                                 General Comments: Pt wants to understand the different procedures and results, and requests that it be explained to her. I think she might be low health literacy.        Exercises     Shoulder Instructions       General Comments      Pertinent Vitals/ Pain       Pain Assessment: Faces Pain Score: 0-No pain Pain Intervention(s): Monitored during session;Heat applied(at end of session)  Home Living                                          Prior Functioning/Environment              Frequency  Min 2X/week        Progress Toward Goals  OT Goals(current goals can now be found in the care plan section)  Progress towards OT goals: Progressing toward goals  Acute Rehab OT Goals Patient Stated Goal: go home, be safe OT Goal Formulation: With patient Time For Goal Achievement: 07/22/19 Potential to Achieve Goals: Good  Plan Discharge plan remains appropriate;Frequency remains appropriate    Co-evaluation                 AM-PAC OT "6 Clicks" Daily Activity     Outcome Measure   Help from another person eating meals?: None Help from another person taking care of personal grooming?: A Little Help from another person toileting, which includes using toliet, bedpan, or urinal?: A Little Help from another person bathing (including washing, rinsing, drying)?: A Little Help from another person to put on and taking off regular upper body clothing?: A Little Help from another person to put on and taking off regular lower body clothing?: A Little 6 Click Score: 19    End of Session Equipment Utilized During Treatment: Rolling walker  OT Visit Diagnosis: Unsteadiness on feet (R26.81);History of falling (Z91.81);Other symptoms and signs involving cognitive function   Activity Tolerance Patient tolerated treatment well   Patient Left in bed;with call bell/phone within  reach   Nurse Communication Mobility status        Time: 1012-1039 OT Time Calculation (min): 27 min  Charges: OT General Charges $OT Visit: 1 Visit OT Treatments $Self Care/Home Management : 23-37 mins  Jesse Sans OTR/L Acute Rehabilitation Services Pager: [phone] Office: [place] 07/12/2019, 11:09 AM

## 2019-07-12 NOTE — Progress Notes (Signed)
RN gave pt discharge instructions and pt state understanding. All new medications have been escribed to pt home pharmacy. Pt tub bench for home has been delivered at bedside, IV will be removed when grandson arrives for pickup

## 2019-07-12 NOTE — Discharge Summary (Signed)
Physician Discharge Summary  [name] BMW:[id number] DOB: May 04, 1951 DOA: 07/06/2019  PCP: [name], FNP  Admit date: 07/06/2019  Discharge date: 07/12/2019  Admitted From:Home  Disposition:  Home  Recommendations for Outpatient Follow-up:  1. Follow up with PCP in 1-2 weeks 2. Follow-up with neurology Dr. Leonie Man in 4 weeks 3. Continue on Holter monitor for 30 days as prescribed and follow-up with Dr. Doylene Canard in 4 weeks 4. Continue on statin as prior along with aspirin and Plavix for 3 weeks and then aspirin only thereafter  Home Health: None  Equipment/Devices: None  Discharge Condition: Stable  CODE STATUS: Full  Diet recommendation: Heart Healthy/carb modified  Brief/Interim Summary: HPI per Dr. Hal Hope: Whitney Pizana Poindexteris a 69 y.o.femalewithhistory of diabetes mellitus type 2, hypertension was brought to the ER the patient has been feeling weak and passing out. As per the patient's grandson who lives with the patient over the last 1 week patient has been passing out whenever she tries to ambulate. Also has been having some diarrhea last few days. Has not had any vomiting or chest pain fever chills or shortness of breath. Recently she had new medication Trulicity added to her diabetic medications. Otherwise her blood pressure medicines and other medications with same. Primary care physician was contacted and was instructed to bring to the ER. Patient did complain of some abdominal epigastric discomfort to the ER physician.  ED Course:In the ER patient was hypotensive with blood pressure in the 27O systolic was given fluid bolus following which blood pressure improved. Labs show significantly increased creatinine of 5.6 from normal last October 2020. LFTs were largely unremarkable. Albumin was 2.7. CBC shows hemoglobin of 10.9 at baseline with mild leukocytosis. Urine shows started bacteria hyaline casts nitrites with negative leukocyte  esterase trace no WBCs. Patient had CT head which was unremarkable CT abdomen shows features concerning for cirrhosis. Patient was given fluid bolus admitted for acute renal failure with dehydration and syncopal episode. EKG shows normal sinus rhythm. On my exam patient appears mildly confused  1/25: TEE performed today with results pending. No new concerns noted at this time. Recheck AM labs and hold further IVF for now.  1/26: TEE with no clot noted to left atrial appendage.  Small PFO noted otherwise.  She has remained in stable condition and may be discharged today with no acute events or concerns otherwise noted.  She will follow up with neurology as well as cardiology in the near future and be on Holter monitor for the next 30 days.  Discharge Diagnoses:  Principal Problem:   ARF (acute renal failure) (HCC) Active Problems:   OSA (obstructive sleep apnea)   Benign essential HTN   Diabetes mellitus with nephropathy (HCC)   Anemia associated with chronic renal failure   Cerebral embolism with cerebral infarction  Principal discharge diagnosis: Patchy left occipital infarct with concern for PAF.  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  AKI-improved; prerenal etiology.  Discharge Instructions  Discharge Instructions    Ambulatory referral to Neurology   Complete by: As directed    Follow up with Dr. Leonie Man at [facility] in 4-6 weeks. Too complicated for NP to follow. Thanks.   Diet - low sodium heart healthy   Complete by: As directed    Increase activity slowly   Complete by: As directed      Allergies as of 07/12/2019   No Known Allergies     Medication List    STOP taking these medications   lisinopril 40  MG tablet Commonly known as: ZESTRIL     TAKE these medications   Accu-Chek Aviva Plus test strip Generic drug: glucose blood USE BID FOR GLUCOSE TESTING   allopurinol 100 MG tablet Commonly known as: ZYLOPRIM Take 1 tablet (100 mg total) by mouth daily.   amLODipine 10 MG  tablet Commonly known as: NORVASC Take 10 mg by mouth daily.   ascorbic acid 250 MG tablet Commonly known as: VITAMIN C Take 1 tablet (250 mg total) by mouth daily with breakfast.   aspirin 81 MG EC tablet Take 1 tablet (81 mg total) by mouth daily.   atorvastatin 20 MG tablet Commonly known as: LIPITOR Take 20 mg by mouth daily.   cetirizine 10 MG tablet Commonly known as: ZYRTEC Take 10 mg by mouth daily.   clopidogrel 75 MG tablet Commonly known as: PLAVIX Take 1 tablet (75 mg total) by mouth daily for 21 days.   cyclobenzaprine 5 MG tablet Commonly known as: FLEXERIL Take 5 mg by mouth at bedtime.   diclofenac sodium 1 % Gel Commonly known as: VOLTAREN Apply 2 g topically daily.   DULoxetine 20 MG capsule Commonly known as: CYMBALTA Take 20 mg by mouth daily.   ferrous sulfate 325 (65 FE) MG tablet Take 1 tablet (325 mg total) by mouth daily with breakfast.   furosemide 20 MG tablet Commonly known as: LASIX Take 20 mg by mouth daily.   glimepiride 4 MG tablet Commonly known as: AMARYL Take 4 mg by mouth daily with breakfast.   ipratropium-albuterol 0.5-2.5 (3) MG/3ML Soln Commonly known as: DUONEB Take 3 mLs by nebulization 2 (two) times daily. What changed:   when to take this  reasons to take this   Januvia 100 MG tablet Generic drug: sitaGLIPtin Take 100 mg by mouth daily.   Lantus SoloStar 100 UNIT/ML Solostar Pen Generic drug: Insulin Glargine Inject 30 Units into the skin at bedtime.   losartan 25 MG tablet Commonly known as: Cozaar Take 1 tablet (25 mg total) by mouth daily.   Lyrica 200 MG capsule Generic drug: pregabalin Take 200 mg by mouth 3 (three) times daily.   metFORMIN 1000 MG tablet Commonly known as: GLUCOPHAGE Take 1,000 mg by mouth 2 (two) times daily.   omeprazole 20 MG capsule Commonly known as: PRILOSEC Take 20 mg by mouth daily.   ondansetron 4 MG tablet Commonly known as: ZOFRAN Take 1 tablet (4 mg total) by  mouth every 6 (six) hours. What changed:   when to take this  reasons to take this   Symbicort 80-4.5 MCG/ACT inhaler Generic drug: budesonide-formoterol Inhale 2 puffs into the lungs 2 (two) times daily.   topiramate 100 MG tablet Commonly known as: TOPAMAX Take 100 mg by mouth daily.   Trulicity 8.09 XI/3.3AS Sopn Generic drug: Dulaglutide Take 0.75 mg by mouth every Monday.   Ventolin HFA 108 (90 Base) MCG/ACT inhaler Generic drug: albuterol Inhale 2 puffs into the lungs every 4 (four) hours as needed for wheezing or shortness of breath.      Follow-up Information    Garvin Fila, MD. Schedule an appointment as soon as possible for a visit in 4 week(s).   Specialties: Neurology, Radiology Contact information: [address] [place] [address] [place]., FNP Follow up in 2 week(s).   Specialty: Family Medicine Contact information: [place] [address] [phone]        Doylene Canard,  Ajay, MD Follow up in 4 week(s).   Specialty: Cardiology Contact information: [place] [address] [phone]          No Known Allergies  Consultations:  Neurology  Cardiology   Procedures/Studies: CT ABDOMEN PELVIS WO CONTRAST  Result Date: 07/07/2019 CLINICAL DATA:  Abdominal pain and distension. EXAM: CT ABDOMEN AND PELVIS WITHOUT CONTRAST TECHNIQUE: Multidetector CT imaging of the abdomen and pelvis was performed following the standard protocol without IV contrast. COMPARISON:  CT 12/24/2010 FINDINGS: Lower chest: Linear atelectasis in the right lower lobe and lingula. Hepatobiliary: Prominent size liver spanning 20 cm cranial caudal. Diffusely decreased hepatic density consistent with steatosis. There are nodular hepatic contours. No evidence of focal lesion. Clips in the gallbladder fossa postcholecystectomy. No biliary dilatation. Pancreas: Fatty atrophy.  No ductal dilatation or  inflammation. Spleen: Normal in size without focal abnormality. Adrenals/Urinary Tract: Normal adrenal glands. Slight left renal atrophy. No hydronephrosis. No perinephric edema. No urolithiasis. Urinary bladder is physiologically distended. No bladder wall thickening. Stomach/Bowel: No bowel obstruction, administered enteric contrast reaches the colon. Stomach is mildly distended with enteric contrast. Question of distal paraesophageal varices, series 3, image 15. No definite small bowel wall thickening. Diminutive appendix tentatively visualized, no evidence of appendicitis. Contrast with small volume of stool in the colon. No colonic wall thickening or inflammation. Vascular/Lymphatic: Mild aortic atherosclerosis. No aortic aneurysm. Small periportal nodes are likely reactive. Reproductive: Hysterectomy. Ovaries tentatively visualized and quiescent. No adnexal mass. Other: Small fat containing umbilical hernia. No ascites. No free air. Musculoskeletal: Posterior lumbosacral fusion. Degenerative change of both hips. There are no acute or suspicious osseous abnormalities. IMPRESSION: 1. No acute abnormality in the abdomen/pelvis. 2. Hepatomegaly and hepatic steatosis. Nodular hepatic contours raises concern for cirrhosis. There are questionable paraesophageal varices, not well assessed in the absence of IV contrast. Aortic Atherosclerosis (ICD10-I70.0). Electronically Signed   By: Keith Rake M.D.   On: 07/07/2019 03:35   CT HEAD WO CONTRAST  Result Date: 07/07/2019 CLINICAL DATA:  Ataxia. EXAM: CT HEAD WITHOUT CONTRAST TECHNIQUE: Contiguous axial images were obtained from the base of the skull through the vertex without intravenous contrast. COMPARISON:  Head CT 05/31/2019 FINDINGS: Brain: Normal brain volume for age. No intracranial hemorrhage, mass effect, or midline shift. No hydrocephalus. The basilar cisterns are patent. No evidence of territorial infarct or acute ischemia. No extra-axial or  intracranial fluid collection. Vascular: No hyperdense vessel or unexpected calcification. Skull: No fracture or focal lesion. Sinuses/Orbits: Chronic left sphenoid sinus sinusitis. Acute findings. Other: None. IMPRESSION: 1. No acute intracranial abnormality. 2. Chronic left sphenoid sinusitis. Electronically Signed   By: Keith Rake M.D.   On: 07/07/2019 05:18   MR BRAIN WO CONTRAST  Result Date: 07/07/2019 CLINICAL DATA:  Encephalopathy.  Hypertension diabetes. EXAM: MRI HEAD WITHOUT CONTRAST TECHNIQUE: Multiplanar, multiecho pulse sequences of the brain and surrounding structures were obtained without intravenous contrast. COMPARISON:  CT head 07/07/2019 FINDINGS: Brain: Probable small area of acute infarct left occipital pole measuring 5 mm. No other acute infarct. Ventricle size normal. Patchy hyperintensity in the pons bilaterally. No prior MRI for comparison. Probable chronic microvascular ischemic change. Negative for hemorrhage or mass. Remainder of the cerebral white matter normal. Vascular: Normal arterial flow voids. Skull and upper cervical spine: No focal skeletal lesion. Sinuses/Orbits: Mucosal edema left sphenoid sinus.  Normal orbit Other: None IMPRESSION: Probable small area of acute infarct left occipital pole Patchy hyperintensity in the pons most likely chronic microvascular ischemia. Electronically Signed  By: Franchot Gallo M.D.   On: 07/07/2019 09:08   MR FEMUR LEFT WO CONTRAST  Result Date: 07/02/2019 CLINICAL DATA:  Left leg pain. EXAM: MR OF THE LEFT FEMUR WITHOUT CONTRAST TECHNIQUE: Multiplanar, multisequence MR imaging of the left thigh/femur was performed. No intravenous contrast was administered. COMPARISON:  None. FINDINGS: Both hips are normally located. Mild/moderate degenerative changes. The femurs appear normal. No fracture or bone lesion. The surrounding thigh musculature appears normal. No muscle tear, intramuscular hematoma or mass. No subcutaneous lesions.  No  inguinal mass or adenopathy. IMPRESSION: Unremarkable MR examination of the left femur and thigh. Electronically Signed   By: Marijo Sanes M.D.   On: 07/02/2019 16:15   MR KNEE LEFT WO CONTRAST  Result Date: 07/02/2019 CLINICAL DATA:  Left lower extremity pain. EXAM: MRI OF THE LEFT KNEE WITHOUT CONTRAST TECHNIQUE: Multiplanar, multisequence MR imaging of the knee was performed. No intravenous contrast was administered. COMPARISON:  None. FINDINGS: Examination is severely limited by motion artifact. The coronal images in particular are nondiagnostic. MENISCI Medial meniscus: Degenerated and torn in the posterior horn and midbody junction region. Lateral meniscus:  Intact LIGAMENTS Cruciates:  Intact Collaterals:  Intact CARTILAGE Patellofemoral:  Moderate degenerative chondrosis. Medial:  Moderate to advanced degenerative chondrosis. Lateral:  Moderate degenerative chondrosis. Joint:  Small joint effusion. Popliteal Fossa:  Small Baker's cyst. Extensor Mechanism: The patella retinacular structures are intact and the quadriceps and patellar tendons are intact. Bones:  No acute bony findings. Other: Normal knee musculature. IMPRESSION: 1. Degenerated and torn medial meniscus. 2. Intact ligamentous structures and no acute bony findings. 3. Tricompartmental degenerative changes. 4. Small joint effusion and small Baker's cyst. Electronically Signed   By: Marijo Sanes M.D.   On: 07/02/2019 16:10   MR HIP LEFT WO CONTRAST  Result Date: 07/02/2019 CLINICAL DATA:  Left hip pain. EXAM: MR OF THE LEFT HIP WITHOUT CONTRAST TECHNIQUE: Multiplanar, multisequence MR imaging was performed. No intravenous contrast was administered. COMPARISON:  None. FINDINGS: Both hips are normally located. Mild/moderate degenerative changes bilaterally with joint space narrowing, spurring and early subchondral cystic change. No stress fracture or AVN. No bone lesions. No periarticular fluid collections to suggest a paralabral cyst. Mild  bilateral peritendinitis but no findings for trochanteric bursitis. The pubic symphysis and SI joints are intact. Moderate degenerative changes. No pelvic fractures or bone lesions. Lumbar fusion hardware is noted. The surrounding hip and pelvic musculature grossly normal. No muscle tear, myositis or mass. No significant intrapelvic abnormalities are identified. IMPRESSION: 1. Mild/moderate bilateral hip joint degenerative changes but no stress fracture or AVN. 2. Mild bilateral peritendinitis but no findings for trochanteric bursitis. 3. Intact bony pelvis. 4. Unremarkable appearance of the surrounding hip and pelvic musculature. Electronically Signed   By: Marijo Sanes M.D.   On: 07/02/2019 16:14   DG Chest Port 1 View  Result Date: 07/06/2019 CLINICAL DATA:  Syncope with weakness and fatigue EXAM: PORTABLE CHEST 1 VIEW COMPARISON:  03/20/2019 FINDINGS: The heart size and mediastinal contours are within normal limits. Both lungs are clear. The visualized skeletal structures are unremarkable. IMPRESSION: No active disease. Electronically Signed   By: Ulyses Jarred M.D.   On: 07/06/2019 23:18   ECHOCARDIOGRAM COMPLETE  Result Date: 07/07/2019   ECHOCARDIOGRAM REPORT   Patient Name:   [name] [facility] Date of Exam: 07/07/2019 Medical Rec #:  [id number]               Height:       64.0 in  Accession #:    [id number]              Weight:       205.5 lb Date of Birth:  1950/10/08               BSA:          1.98 m Patient Age:    25 years                BP:           117/54 mmHg Patient Gender: F                       HR:           47 bpm. Exam Location:  Inpatient Procedure: 2D Echo Indications:    syncope 780.2  History:        Patient has prior history of Echocardiogram examinations, most                 recent 06/22/2018. Risk Factors:Diabetes and Hypertension.  Sonographer:    Johny Chess Referring Phys: [id number] OMAIR LATIF [place]  1. Left ventricular ejection fraction, by visual  estimation, is 65 to 70%. The left ventricle has hyperdynamic function. There is no left ventricular hypertrophy.  2. Left ventricular diastolic parameters are consistent with Grade I diastolic dysfunction (impaired relaxation).  3. The left ventricle has no regional wall motion abnormalities.  4. Global right ventricle has normal systolic function.The right ventricular size is normal. No increase in right ventricular wall thickness.  5. Left atrial size was normal.  6. Right atrial size was normal.  7. The mitral valve is normal in structure. No evidence of mitral valve regurgitation. No evidence of mitral stenosis.  8. The tricuspid valve is normal in structure. Tricuspid valve regurgitation is not demonstrated.  9. The aortic valve is tricuspid. Mild aortic valve stenosis. Mean gradient 11 mmHg. 10. The inferior vena cava is normal in size with greater than 50% respiratory variability, suggesting right atrial pressure of 3 mmHg. 11. TR signal is inadequate for assessing pulmonary artery systolic pressure. FINDINGS  Left Ventricle: Left ventricular ejection fraction, by visual estimation, is 65 to 70%. The left ventricle has hyperdynamic function. The left ventricle has no regional wall motion abnormalities. There is no left ventricular hypertrophy. Left ventricular diastolic parameters are consistent with Grade I diastolic dysfunction (impaired relaxation). Right Ventricle: The right ventricular size is normal. No increase in right ventricular wall thickness. Global RV systolic function is has normal systolic function. Left Atrium: Left atrial size was normal in size. Right Atrium: Right atrial size was normal in size Pericardium: There is no evidence of pericardial effusion. Mitral Valve: The mitral valve is normal in structure. No evidence of mitral valve regurgitation. No evidence of mitral valve stenosis by observation. Tricuspid Valve: The tricuspid valve is normal in structure. Tricuspid valve regurgitation  is not demonstrated. Aortic Valve: The aortic valve is tricuspid. Aortic valve regurgitation is not visualized. Mild aortic stenosis is present. Pulmonic Valve: The pulmonic valve was normal in structure. Pulmonic valve regurgitation is not visualized. Pulmonic regurgitation is not visualized. Aorta: The aortic root is normal in size and structure. Venous: The inferior vena cava is normal in size with greater than 50% respiratory variability, suggesting right atrial pressure of 3 mmHg. IAS/Shunts: No atrial level shunt detected by color flow Doppler.  LEFT VENTRICLE PLAX 2D LVIDd:  4.90 cm  Diastology LVIDs:         2.60 cm  LV e' lateral:   12.00 cm/s LV PW:         1.00 cm  LV E/e' lateral: 8.4 LV IVS:        0.80 cm  LV e' medial:    8.38 cm/s LVOT diam:     2.00 cm  LV E/e' medial:  12.1 LV SV:         88 ml LV SV Index:   42.16 LVOT Area:     3.14 cm  RIGHT VENTRICLE RV S prime:     21.10 cm/s TAPSE (M-mode): 2.5 cm LEFT ATRIUM             Index       RIGHT ATRIUM           Index LA diam:        3.90 cm 1.97 cm/m  RA Area:     11.90 cm LA Vol (A2C):   52.4 ml 26.48 ml/m RA Volume:   24.90 ml  12.58 ml/m LA Vol (A4C):   34.0 ml 17.18 ml/m LA Biplane Vol: 42.4 ml 21.43 ml/m   AORTA Ao Root diam: 2.80 cm MITRAL VALVE MV Area (PHT): 3.12 cm              SHUNTS MV PHT:        70.47 msec            Systemic Diam: 2.00 cm MV Decel Time: 243 msec MV E velocity: 101.00 cm/s 103 cm/s MV A velocity: 110.00 cm/s 70.3 cm/s MV E/A ratio:  0.92        1.5  Loralie Champagne MD Electronically signed by Loralie Champagne MD Signature Date/Time: 07/07/2019/6:06:53 PM    Final    ECHO TEE  Result Date: 07/11/2019   TRANSESOPHOGEAL ECHO REPORT   Patient Name:   [name] [facility] Date of Exam: 07/11/2019 Medical Rec #:  [id number]               Height:       64.0 in Accession #:    [id number]              Weight:       205.5 lb Date of Birth:  [birth year]               BSA:          1.98 m Patient Age:    43 years                 BP:           153/87 mmHg Patient Gender: F                       HR:           76 bpm. Exam Location:  Inpatient  Procedure: 2D Echo Indications:     Stroke 434.91 / I63.9  History:         Patient has prior history of Echocardiogram examinations, most                  recent 07/07/2019.  Sonographer:     Vikki Ports [place] Referring Phys:  [place] Diagnosing Phys: Dixie Dials MD  PROCEDURE: The transesophogeal probe was passed through the esophogus of the patient. The patient developed no complications during the procedure. IMPRESSIONS  1. Left ventricular ejection fraction,  by visual estimation, is 55 to 60%. The left ventricle has normal function. There is no left ventricular hypertrophy.  2. Left ventricular diastolic parameters are consistent with Grade I diastolic dysfunction (impaired relaxation).  3. Global right ventricle has normal systolic function.The right ventricular size is normal. No increase in right ventricular wall thickness.  4. Left atrial size was mildly dilated.  5. Normal LA appendage.  6. Right atrial size was normal.  7. Mild thickening of the anterior and posterior mitral valve leaflet(s).  8. The mitral valve is degenerative. Mild to moderate mitral valve regurgitation.  9. Multiple jets of MR. 10. The tricuspid valve is normal in structure. Tricuspid valve regurgitation is mild. 11. The aortic valve is tricuspid. Aortic valve regurgitation is trivial. 12. The pulmonic valve was normal in structure. Pulmonic valve regurgitation is trivial. 13. Moderate plaque invoving the ascending aorta. 14. Mild plaque in descending aorta. 15. Patent foramen ovale with predominantly right to left shunting across the atrial septum. FINDINGS  Left Ventricle: Left ventricular ejection fraction, by visual estimation, is 55 to 60%. The left ventricle has normal function. The left ventricle has no regional wall motion abnormalities. There is no left ventricular hypertrophy. Left  ventricular diastolic parameters are consistent with Grade I diastolic dysfunction (impaired relaxation). Right Ventricle: The right ventricular size is normal. No increase in right ventricular wall thickness. Global RV systolic function is has normal systolic function. Left Atrium: Left atrial size was mildly dilated. Normal LA appendage. Right Atrium: Right atrial size was normal in size Pericardium: There is no evidence of pericardial effusion. Mitral Valve: The mitral valve is degenerative in appearance. There is mild thickening of the anterior and posterior mitral valve leaflet(s). Normal mobility of the mitral valve leaflets. Mild to moderate mitral valve regurgitation, with centrally-directed jet. Multiple jets of MR. Tricuspid Valve: The tricuspid valve is normal in structure. Tricuspid valve regurgitation is mild. Aortic Valve: The aortic valve is tricuspid. . There is mild thickening and mild calcification of the aortic valve. Aortic valve regurgitation is trivial. There is mild calcification of the aortic valve. There is mild thickening of the aortic valve. Pulmonic Valve: The pulmonic valve was normal in structure. Pulmonic valve regurgitation is trivial. Aorta: The aortic root is normal in size and structure. There is moderate, atheroma and protruding plaque involving the ascending aorta. Mild plaque in descending aorta. Venous: The right lower pulmonary vein is, the right upper pulmonary vein is and the left lower pulmonary vein is normal. The inferior vena cava is normal in size with greater than 50% respiratory variability, suggesting right atrial pressure of 3 mmHg. Shunts: Agitated saline contrast was given intravenously to evaluate for intracardiac shunting. Saline shunting was observed within 3-6 cardiac cycles suggestive of interatrial shunt. Evidence of atrial level shunting detected by color flow Doppler.  Dixie Dials MD Electronically signed by Dixie Dials MD Signature Date/Time:  07/11/2019/8:10:01 PM    Final    VAS US CAROTID  Result Date: 07/08/2019 Carotid Arterial Duplex Study Indications:       CVA and carotid stenosis. Risk Factors:      Hypertension, hyperlipidemia, Diabetes. Comparison Study:  no prior Performing Technologist: Abram Sander RVS  Examination Guidelines: A complete evaluation includes B-mode imaging, spectral Doppler, color Doppler, and power Doppler as needed of all accessible portions of each vessel. Bilateral testing is considered an integral part of a complete examination. Limited examinations for reoccurring indications may be performed as noted.  Right Carotid Findings: +----------+--------+--------+--------+------------------+--------+  PSV cm/sEDV cm/sStenosisPlaque DescriptionComments +----------+--------+--------+--------+------------------+--------+ CCA Prox  91      28              heterogenous               +----------+--------+--------+--------+------------------+--------+ CCA Distal78      24              heterogenous               +----------+--------+--------+--------+------------------+--------+ ICA Prox  72      30      1-39%   heterogenous               +----------+--------+--------+--------+------------------+--------+ ICA Distal236     76                                         +----------+--------+--------+--------+------------------+--------+ ECA       77      11                                         +----------+--------+--------+--------+------------------+--------+ +----------+--------+-------+--------+-------------------+           PSV cm/sEDV cmsDescribeArm Pressure (mmHG) +----------+--------+-------+--------+-------------------+ [id number]                                         +----------+--------+-------+--------+-------------------+ +---------+--------+--+--------+--+ VertebralPSV cm/s75EDV cm/s17 +---------+--------+--+--------+--+  Left Carotid Findings:  +----------+--------+--------+--------+------------------+--------+           PSV cm/sEDV cm/sStenosisPlaque DescriptionComments +----------+--------+--------+--------+------------------+--------+ CCA Prox  148     41              heterogenous               +----------+--------+--------+--------+------------------+--------+ CCA Distal81      31              heterogenous               +----------+--------+--------+--------+------------------+--------+ ICA Prox  118     50      40-59%  heterogenous               +----------+--------+--------+--------+------------------+--------+ ICA Mid   166     60                                         +----------+--------+--------+--------+------------------+--------+ ICA Distal181     63                                         +----------+--------+--------+--------+------------------+--------+ ECA       54      15                                         +----------+--------+--------+--------+------------------+--------+ +----------+--------+--------+--------+-------------------+           PSV cm/sEDV cm/sDescribeArm Pressure (mmHG) +----------+--------+--------+--------+-------------------+ [id number]                                         +----------+--------+--------+--------+-------------------+ +---------+--------+--+--------+--+---------+  VertebralPSV cm/s67EDV cm/s24Antegrade +---------+--------+--+--------+--+---------+   Summary: Right Carotid: Velocities in the right ICA are consistent with a 1-39% stenosis. Left Carotid: Velocities in the left ICA are consistent with a 40-59% stenosis. Vertebrals: Bilateral vertebral arteries demonstrate antegrade flow. *See table(s) above for measurements and observations.     Preliminary    VAS [place] TRANSCRANIAL DOPPLER  Result Date: 07/08/2019  Transcranial Doppler Indications: Stroke. Comparison Study: no prior Performing Technologist: Abram Sander RVS   Examination Guidelines: A complete evaluation includes B-mode imaging, spectral Doppler, color Doppler, and power Doppler as needed of all accessible portions of each vessel. Bilateral testing is considered an integral part of a complete examination. Limited examinations for reoccurring indications may be performed as noted.  +----------+-------------+----------+-----------+-------+ RIGHT TCD Right VM (cm)Depth (cm)PulsatilityComment +----------+-------------+----------+-----------+-------+ ACA           28.00                 1.20            +----------+-------------+----------+-----------+-------+ Term ICA      38.00                 1.27            +----------+-------------+----------+-----------+-------+ PCA           51.00                 0.94            +----------+-------------+----------+-----------+-------+ Opthalmic     20.00                 1.10            +----------+-------------+----------+-----------+-------+ ICA siphon    47.00                 0.65            +----------+-------------+----------+-----------+-------+ Vertebral    -27.00                 0.89            +----------+-------------+----------+-----------+-------+  +----------+------------+----------+-----------+-------+ LEFT TCD  Left VM (cm)Depth (cm)PulsatilityComment +----------+------------+----------+-----------+-------+ MCA          88.00                 0.72            +----------+------------+----------+-----------+-------+ Term ICA     39.00                 0.82            +----------+------------+----------+-----------+-------+ PCA          25.00                 0.93            +----------+------------+----------+-----------+-------+ Opthalmic    20.00                 1.33            +----------+------------+----------+-----------+-------+ ICA siphon   65.00                 0.65            +----------+------------+----------+-----------+-------+   +------------+-------+-------+             VM cm/sComment +------------+-------+-------+ Prox Basilar 69.00         +------------+-------+-------+    Preliminary      Discharge Exam: Vitals:   07/11/19 2317 07/12/19 0421  BP: Marland Kitchen)  142/78 (!) 145/69  Pulse: 70 72  Resp: 17 18  Temp: 98.2 F (36.8 C) 98.3 F (36.8 C)  SpO2: 100% 100%   Vitals:   07/11/19 1807 07/11/19 2028 07/11/19 2317 07/12/19 0421  BP: 134/69  (!) 142/78 (!) 145/69  Pulse: 72  70 72  Resp: 16  17 18   Temp: 98 F (36.7 C)  98.2 F (36.8 C) 98.3 F (36.8 C)  TempSrc: Oral  Oral Oral  SpO2: 100% 99% 100% 100%  Weight:      Height:        General: Pt is alert, awake, not in acute distress Cardiovascular: RRR, S1/S2 +, no rubs, no gallops Respiratory: CTA bilaterally, no wheezing, no rhonchi Abdominal: Soft, NT, ND, bowel sounds + Extremities: no edema, no cyanosis    The results of significant diagnostics from this hospitalization (including imaging, microbiology, ancillary and laboratory) are listed below for reference.     Microbiology: Recent Results (from the past 240 hour(s))  Respiratory Panel by RT PCR (Flu A&B, Covid) - Nasopharyngeal Swab     Status: None   Collection Time: 07/06/19 10:50 PM   Specimen: Nasopharyngeal Swab  Result Value Ref Range Status   SARS Coronavirus 2 by RT PCR NEGATIVE NEGATIVE Final    Comment: (NOTE) SARS-CoV-2 target nucleic acids are NOT DETECTED. The SARS-CoV-2 RNA is generally detectable in upper respiratoy specimens during the acute phase of infection. The lowest concentration of SARS-CoV-2 viral copies this assay can detect is 131 copies/mL. A negative result does not preclude SARS-Cov-2 infection and should not be used as the sole basis for treatment or other patient management decisions. A negative result may occur with  improper specimen collection/handling, submission of specimen other than nasopharyngeal swab, presence of viral mutation(s)  within the areas targeted by this assay, and inadequate number of viral copies (<131 copies/mL). A negative result must be combined with clinical observations, patient history, and epidemiological information. The expected result is Negative. Fact Sheet for Patients:  PinkCheek.be Fact Sheet for Healthcare Providers:  GravelBags.it This test is not yet ap proved or cleared by the [place] FDA and  has been authorized for detection and/or diagnosis of SARS-CoV-2 by FDA under an Emergency Use Authorization (EUA). This EUA will remain  in effect (meaning this test can be used) for the duration of the COVID-19 declaration under Section 564(b)(1) of the Act, 21 U.S.C. section 360bbb-3(b)(1), unless the authorization is terminated or revoked sooner.    Influenza A by PCR NEGATIVE NEGATIVE Final   Influenza B by PCR NEGATIVE NEGATIVE Final    Comment: (NOTE) The Xpert Xpress SARS-CoV-2/FLU/RSV assay is intended as an aid in  the diagnosis of influenza from Nasopharyngeal swab specimens and  should not be used as a sole basis for treatment. Nasal washings and  aspirates are unacceptable for Xpert Xpress SARS-CoV-2/FLU/RSV  testing. Fact Sheet for Patients: PinkCheek.be Fact Sheet for Healthcare Providers: GravelBags.it This test is not yet approved or cleared by the [place] FDA and  has been authorized for detection and/or diagnosis of SARS-CoV-2 by  FDA under an Emergency Use Authorization (EUA). This EUA will remain  in effect (meaning this test can be used) for the duration of the  Covid-19 declaration under Section 564(b)(1) of the Act, 21  U.S.C. section 360bbb-3(b)(1), unless the authorization is  terminated or revoked. Performed at [facility], [place] [address] [place],  [address]   Culture, blood (Routine X 2) w Reflex to ID  Panel     Status:  None (Preliminary result)   Collection Time: 07/08/19  7:35 AM   Specimen: BLOOD LEFT HAND  Result Value Ref Range Status   Specimen Description BLOOD LEFT HAND  Final   Special Requests   Final    BOTTLES DRAWN AEROBIC AND ANAEROBIC Blood Culture results may not be optimal due to an inadequate volume of blood received in culture bottles   Culture   Final    NO GROWTH 3 DAYS Performed at [facility], [place] [address] [place], [place] [address]    Report Status PENDING  Incomplete  Culture, blood (Routine X 2) w Reflex to ID Panel     Status: None (Preliminary result)   Collection Time: 07/08/19  7:47 AM   Specimen: BLOOD RIGHT HAND  Result Value Ref Range Status   Specimen Description BLOOD RIGHT HAND  Final   Special Requests   Final    BOTTLES DRAWN AEROBIC ONLY Blood Culture results may not be optimal due to an inadequate volume of blood received in culture bottles   Culture   Final    NO GROWTH 3 DAYS Performed at [facility], [place] [address] [place], [place] [address]    Report Status PENDING  Incomplete     Labs: BNP (last 3 results) No results for input(s): BNP in the last 8760 hours. Basic Metabolic Panel: Recent Labs  Lab 07/07/19 0750 07/08/19 0658 07/09/19 0458 07/10/19 0225 07/12/19 0222  NA 137 138 139 140 145  K 4.3 3.8 3.7 3.8 4.3  CL 105 109 108 111 114*  CO2 18* 18* 20* 20* 23  GLUCOSE 206* 253* 198* 227* 122*  BUN 50* 34* 20 16 12   CREATININE 4.17* 2.62* 1.74* 1.25* 1.11*  CALCIUM 8.2* 8.1* 7.9* 7.9* 8.3*  MG  --  1.4*  --   --   --   PHOS  --  3.0  --   --   --    Liver Function Tests: Recent Labs  Lab 07/06/19 2247 07/07/19 0750 07/08/19 0658 07/10/19 0225  AST 34 32 29 17  ALT 21 26 27 22   ALKPHOS 61 72 99 75  BILITOT 0.7 0.4 0.8 0.9  PROT 5.8* 6.8 7.1 5.7*  ALBUMIN 2.7* 3.2* 3.3* 2.7*   No results for input(s): LIPASE, AMYLASE in the last 168 hours. Recent Labs  Lab 07/06/19 2250  AMMONIA 20   CBC: Recent Labs   Lab 07/06/19 2247 07/06/19 2247 07/06/19 2303 07/07/19 0535 07/08/19 0658 07/10/19 0225 07/12/19 0222  WBC 7.6  --   --  8.2 6.5 4.1 4.9  NEUTROABS 3.5  --   --   --  2.4  --   --   HGB 9.1*   < > 9.9* 8.7* 9.7* 8.3* 8.7*  HCT 31.9*   < > 29.0* 31.2* 32.8* 28.9* 30.6*  MCV 79.6*  --   --  78.2* 74.7* 77.1* 78.3*  PLT 162  --   --  149* 154 95* 88*   < > = values in this interval not displayed.   Cardiac Enzymes: No results for input(s): CKTOTAL, CKMB, CKMBINDEX, TROPONINI in the last 168 hours. BNP: Invalid input(s): POCBNP CBG: Recent Labs  Lab 07/11/19 0544 07/11/19 1107 07/11/19 1602 07/11/19 2054 07/12/19 0602  GLUCAP 161* 149* 159* 248* 89   D-Dimer No results for input(s): DDIMER in the last 72 hours. Hgb A1c No results for input(s): HGBA1C in the last 72 hours.  Lipid Profile No results for input(s): CHOL, HDL, LDLCALC, TRIG, CHOLHDL, LDLDIRECT in the last 72 hours. Thyroid function studies No results for input(s): TSH, T4TOTAL, T3FREE, THYROIDAB in the last 72 hours.  Invalid input(s): FREET3 Anemia work up Recent Labs    07/09/19 1330  FERRITIN 24  TIBC 392  IRON 30   Urinalysis    Component Value Date/Time   COLORURINE YELLOW 07/07/2019 0610   APPEARANCEUR CLEAR 07/07/2019 0610   LABSPEC 1.008 07/07/2019 0610   PHURINE 5.0 07/07/2019 0610   GLUCOSEU >=500 (A) 07/07/2019 0610   HGBUR SMALL (A) 07/07/2019 0610   BILIRUBINUR NEGATIVE 07/07/2019 0610   KETONESUR NEGATIVE 07/07/2019 0610   PROTEINUR NEGATIVE 07/07/2019 0610   UROBILINOGEN 0.2 08/18/2012 1110   NITRITE NEGATIVE 07/07/2019 0610   LEUKOCYTESUR TRACE (A) 07/07/2019 0610   Sepsis Labs Invalid input(s): PROCALCITONIN,  WBC,  LACTICIDVEN Microbiology Recent Results (from the past 240 hour(s))  Respiratory Panel by RT PCR (Flu A&B, Covid) - Nasopharyngeal Swab     Status: None   Collection Time: 07/06/19 10:50 PM   Specimen: Nasopharyngeal Swab  Result Value Ref Range Status    SARS Coronavirus 2 by RT PCR NEGATIVE NEGATIVE Final    Comment: (NOTE) SARS-CoV-2 target nucleic acids are NOT DETECTED. The SARS-CoV-2 RNA is generally detectable in upper respiratoy specimens during the acute phase of infection. The lowest concentration of SARS-CoV-2 viral copies this assay can detect is 131 copies/mL. A negative result does not preclude SARS-Cov-2 infection and should not be used as the sole basis for treatment or other patient management decisions. A negative result may occur with  improper specimen collection/handling, submission of specimen other than nasopharyngeal swab, presence of viral mutation(s) within the areas targeted by this assay, and inadequate number of viral copies (<131 copies/mL). A negative result must be combined with clinical observations, patient history, and epidemiological information. The expected result is Negative. Fact Sheet for Patients:  PinkCheek.be Fact Sheet for Healthcare Providers:  GravelBags.it This test is not yet ap proved or cleared by the [place] FDA and  has been authorized for detection and/or diagnosis of SARS-CoV-2 by FDA under an Emergency Use Authorization (EUA). This EUA will remain  in effect (meaning this test can be used) for the duration of the COVID-19 declaration under Section 564(b)(1) of the Act, 21 U.S.C. section 360bbb-3(b)(1), unless the authorization is terminated or revoked sooner.    Influenza A by PCR NEGATIVE NEGATIVE Final   Influenza B by PCR NEGATIVE NEGATIVE Final    Comment: (NOTE) The Xpert Xpress SARS-CoV-2/FLU/RSV assay is intended as an aid in  the diagnosis of influenza from Nasopharyngeal swab specimens and  should not be used as a sole basis for treatment. Nasal washings and  aspirates are unacceptable for Xpert Xpress SARS-CoV-2/FLU/RSV  testing. Fact Sheet for Patients: PinkCheek.be Fact  Sheet for Healthcare Providers: GravelBags.it This test is not yet approved or cleared by the [place] FDA and  has been authorized for detection and/or diagnosis of SARS-CoV-2 by  FDA under an Emergency Use Authorization (EUA). This EUA will remain  in effect (meaning this test can be used) for the duration of the  Covid-19 declaration under Section 564(b)(1) of the Act, 21  U.S.C. section 360bbb-3(b)(1), unless the authorization is  terminated or revoked. Performed at [facility], [place] [address] [place], [place] [address]   Culture, blood (Routine X 2) w Reflex to ID Panel     Status: None (Preliminary result)  Collection Time: 07/08/19  7:35 AM   Specimen: BLOOD LEFT HAND  Result Value Ref Range Status   Specimen Description BLOOD LEFT HAND  Final   Special Requests   Final    BOTTLES DRAWN AEROBIC AND ANAEROBIC Blood Culture results may not be optimal due to an inadequate volume of blood received in culture bottles   Culture   Final    NO GROWTH 3 DAYS Performed at [facility], [place] [address], [place], [place] [address]    Report Status PENDING  Incomplete  Culture, blood (Routine X 2) w Reflex to ID Panel     Status: None (Preliminary result)   Collection Time: 07/08/19  7:47 AM   Specimen: BLOOD RIGHT HAND  Result Value Ref Range Status   Specimen Description BLOOD RIGHT HAND  Final   Special Requests   Final    BOTTLES DRAWN AEROBIC ONLY Blood Culture results may not be optimal due to an inadequate volume of blood received in culture bottles   Culture   Final    NO GROWTH 3 DAYS Performed at [facility], [place] [address], [place], [place] [address]    Report Status PENDING  Incomplete     Time coordinating discharge: 35 minutes  SIGNED:   Rodena Goldmann, DO [facility] 07/12/2019, 7:53 AM  If 7PM-7AM, please contact night-coverage [email or web address]

## 2019-07-13 ENCOUNTER — Encounter: Payer: Self-pay | Admitting: *Deleted

## 2019-07-13 LAB — CULTURE, BLOOD (ROUTINE X 2)
Culture: NO GROWTH
Culture: NO GROWTH

## 2019-07-25 DIAGNOSIS — Z8249 Family history of ischemic heart disease and other diseases of the circulatory system: Secondary | ICD-10-CM | POA: Diagnosis not present

## 2019-07-25 DIAGNOSIS — M179 Osteoarthritis of knee, unspecified: Secondary | ICD-10-CM | POA: Diagnosis not present

## 2019-07-25 DIAGNOSIS — M1990 Unspecified osteoarthritis, unspecified site: Secondary | ICD-10-CM | POA: Diagnosis not present

## 2019-07-25 DIAGNOSIS — E1140 Type 2 diabetes mellitus with diabetic neuropathy, unspecified: Secondary | ICD-10-CM | POA: Diagnosis not present

## 2019-07-25 DIAGNOSIS — J45909 Unspecified asthma, uncomplicated: Secondary | ICD-10-CM | POA: Diagnosis not present

## 2019-07-25 DIAGNOSIS — G4730 Sleep apnea, unspecified: Secondary | ICD-10-CM | POA: Diagnosis not present

## 2019-07-25 DIAGNOSIS — M48062 Spinal stenosis, lumbar region with neurogenic claudication: Secondary | ICD-10-CM | POA: Diagnosis not present

## 2019-07-25 DIAGNOSIS — G894 Chronic pain syndrome: Secondary | ICD-10-CM | POA: Diagnosis not present

## 2019-08-01 DIAGNOSIS — E1165 Type 2 diabetes mellitus with hyperglycemia: Secondary | ICD-10-CM | POA: Diagnosis not present

## 2019-08-01 DIAGNOSIS — I6340 Cerebral infarction due to embolism of unspecified cerebral artery: Secondary | ICD-10-CM | POA: Diagnosis not present

## 2019-08-01 DIAGNOSIS — Z79899 Other long term (current) drug therapy: Secondary | ICD-10-CM | POA: Diagnosis not present

## 2019-08-01 DIAGNOSIS — I129 Hypertensive chronic kidney disease with stage 1 through stage 4 chronic kidney disease, or unspecified chronic kidney disease: Secondary | ICD-10-CM | POA: Diagnosis not present

## 2019-08-01 DIAGNOSIS — E559 Vitamin D deficiency, unspecified: Secondary | ICD-10-CM | POA: Diagnosis not present

## 2019-08-01 DIAGNOSIS — D6859 Other primary thrombophilia: Secondary | ICD-10-CM | POA: Diagnosis not present

## 2019-08-15 ENCOUNTER — Ambulatory Visit: Payer: Medicare Other | Admitting: Orthopedic Surgery

## 2019-08-20 ENCOUNTER — Ambulatory Visit: Payer: Medicare Other

## 2019-08-22 DIAGNOSIS — E1140 Type 2 diabetes mellitus with diabetic neuropathy, unspecified: Secondary | ICD-10-CM | POA: Diagnosis not present

## 2019-08-22 DIAGNOSIS — M1990 Unspecified osteoarthritis, unspecified site: Secondary | ICD-10-CM | POA: Diagnosis not present

## 2019-08-22 DIAGNOSIS — M179 Osteoarthritis of knee, unspecified: Secondary | ICD-10-CM | POA: Diagnosis not present

## 2019-08-22 DIAGNOSIS — G894 Chronic pain syndrome: Secondary | ICD-10-CM | POA: Diagnosis not present

## 2019-08-30 DIAGNOSIS — E1122 Type 2 diabetes mellitus with diabetic chronic kidney disease: Secondary | ICD-10-CM | POA: Diagnosis not present

## 2019-08-30 DIAGNOSIS — E1165 Type 2 diabetes mellitus with hyperglycemia: Secondary | ICD-10-CM | POA: Diagnosis not present

## 2019-08-30 DIAGNOSIS — I129 Hypertensive chronic kidney disease with stage 1 through stage 4 chronic kidney disease, or unspecified chronic kidney disease: Secondary | ICD-10-CM | POA: Diagnosis not present

## 2019-09-06 ENCOUNTER — Encounter: Payer: Self-pay | Admitting: Radiology

## 2019-09-06 NOTE — Progress Notes (Signed)
3/10 Received cancellation notification from [employer]. Patient requested cancellation of monitor service.

## 2019-09-07 ENCOUNTER — Ambulatory Visit: Payer: Medicare Other | Admitting: Orthopedic Surgery

## 2019-09-14 ENCOUNTER — Ambulatory Visit: Payer: Medicare Other | Admitting: Orthopedic Surgery

## 2019-09-17 ENCOUNTER — Encounter (HOSPITAL_COMMUNITY): Payer: Self-pay | Admitting: Emergency Medicine

## 2019-09-17 ENCOUNTER — Inpatient Hospital Stay (HOSPITAL_COMMUNITY)
Admission: EM | Admit: 2019-09-17 | Discharge: 2019-09-19 | DRG: 641 | Disposition: A | Discharge status: Home Health | Payer: Medicare Other | Attending: Family Medicine | Admitting: Family Medicine

## 2019-09-17 DIAGNOSIS — E785 Hyperlipidemia, unspecified: Secondary | ICD-10-CM | POA: Diagnosis not present

## 2019-09-17 DIAGNOSIS — I951 Orthostatic hypotension: Secondary | ICD-10-CM | POA: Diagnosis not present

## 2019-09-17 DIAGNOSIS — Z7984 Long term (current) use of oral hypoglycemic drugs: Secondary | ICD-10-CM | POA: Diagnosis not present

## 2019-09-17 DIAGNOSIS — Z20822 Contact with and (suspected) exposure to covid-19: Secondary | ICD-10-CM | POA: Diagnosis present

## 2019-09-17 DIAGNOSIS — E1165 Type 2 diabetes mellitus with hyperglycemia: Secondary | ICD-10-CM | POA: Diagnosis present

## 2019-09-17 DIAGNOSIS — D696 Thrombocytopenia, unspecified: Secondary | ICD-10-CM | POA: Diagnosis not present

## 2019-09-17 DIAGNOSIS — E1122 Type 2 diabetes mellitus with diabetic chronic kidney disease: Secondary | ICD-10-CM | POA: Diagnosis present

## 2019-09-17 DIAGNOSIS — I5032 Chronic diastolic (congestive) heart failure: Secondary | ICD-10-CM | POA: Diagnosis not present

## 2019-09-17 DIAGNOSIS — E872 Acidosis: Secondary | ICD-10-CM | POA: Diagnosis present

## 2019-09-17 DIAGNOSIS — I130 Hypertensive heart and chronic kidney disease with heart failure and stage 1 through stage 4 chronic kidney disease, or unspecified chronic kidney disease: Secondary | ICD-10-CM | POA: Diagnosis not present

## 2019-09-17 DIAGNOSIS — I959 Hypotension, unspecified: Secondary | ICD-10-CM | POA: Diagnosis not present

## 2019-09-17 DIAGNOSIS — Z8673 Personal history of transient ischemic attack (TIA), and cerebral infarction without residual deficits: Secondary | ICD-10-CM | POA: Diagnosis not present

## 2019-09-17 DIAGNOSIS — R531 Weakness: Secondary | ICD-10-CM | POA: Diagnosis not present

## 2019-09-17 DIAGNOSIS — R270 Ataxia, unspecified: Secondary | ICD-10-CM | POA: Diagnosis not present

## 2019-09-17 DIAGNOSIS — M1990 Unspecified osteoarthritis, unspecified site: Secondary | ICD-10-CM | POA: Diagnosis present

## 2019-09-17 DIAGNOSIS — Z79899 Other long term (current) drug therapy: Secondary | ICD-10-CM | POA: Diagnosis not present

## 2019-09-17 DIAGNOSIS — R404 Transient alteration of awareness: Secondary | ICD-10-CM | POA: Diagnosis not present

## 2019-09-17 DIAGNOSIS — R7989 Other specified abnormal findings of blood chemistry: Secondary | ICD-10-CM

## 2019-09-17 DIAGNOSIS — Z743 Need for continuous supervision: Secondary | ICD-10-CM | POA: Diagnosis not present

## 2019-09-17 DIAGNOSIS — R0689 Other abnormalities of breathing: Secondary | ICD-10-CM | POA: Diagnosis not present

## 2019-09-17 DIAGNOSIS — R55 Syncope and collapse: Secondary | ICD-10-CM | POA: Diagnosis not present

## 2019-09-17 DIAGNOSIS — D649 Anemia, unspecified: Secondary | ICD-10-CM

## 2019-09-17 DIAGNOSIS — R519 Headache, unspecified: Secondary | ICD-10-CM | POA: Diagnosis present

## 2019-09-17 DIAGNOSIS — K219 Gastro-esophageal reflux disease without esophagitis: Secondary | ICD-10-CM | POA: Diagnosis not present

## 2019-09-17 DIAGNOSIS — F419 Anxiety disorder, unspecified: Secondary | ICD-10-CM | POA: Diagnosis present

## 2019-09-17 DIAGNOSIS — J42 Unspecified chronic bronchitis: Secondary | ICD-10-CM | POA: Diagnosis present

## 2019-09-17 DIAGNOSIS — Z66 Do not resuscitate: Secondary | ICD-10-CM | POA: Diagnosis present

## 2019-09-17 DIAGNOSIS — I950 Idiopathic hypotension: Secondary | ICD-10-CM

## 2019-09-17 DIAGNOSIS — D509 Iron deficiency anemia, unspecified: Secondary | ICD-10-CM | POA: Diagnosis present

## 2019-09-17 DIAGNOSIS — E1142 Type 2 diabetes mellitus with diabetic polyneuropathy: Secondary | ICD-10-CM | POA: Diagnosis not present

## 2019-09-17 DIAGNOSIS — N179 Acute kidney failure, unspecified: Secondary | ICD-10-CM

## 2019-09-17 DIAGNOSIS — Z7951 Long term (current) use of inhaled steroids: Secondary | ICD-10-CM

## 2019-09-17 DIAGNOSIS — Z800 Family history of malignant neoplasm of digestive organs: Secondary | ICD-10-CM

## 2019-09-17 DIAGNOSIS — N183 Chronic kidney disease, stage 3 unspecified: Secondary | ICD-10-CM | POA: Diagnosis present

## 2019-09-17 DIAGNOSIS — G4733 Obstructive sleep apnea (adult) (pediatric): Secondary | ICD-10-CM | POA: Diagnosis present

## 2019-09-17 DIAGNOSIS — I2510 Atherosclerotic heart disease of native coronary artery without angina pectoris: Secondary | ICD-10-CM | POA: Diagnosis present

## 2019-09-17 DIAGNOSIS — F329 Major depressive disorder, single episode, unspecified: Secondary | ICD-10-CM | POA: Diagnosis present

## 2019-09-17 DIAGNOSIS — E860 Dehydration: Principal | ICD-10-CM | POA: Diagnosis present

## 2019-09-17 DIAGNOSIS — G8929 Other chronic pain: Secondary | ICD-10-CM | POA: Diagnosis present

## 2019-09-17 MED ORDER — SODIUM CHLORIDE 0.9 % IV BOLUS
1000.0000 mL | Freq: Once | INTRAVENOUS | Status: AC
  Administered 2019-09-18: 1000 mL via INTRAVENOUS

## 2019-09-17 NOTE — ED Notes (Signed)
Per EMS 1mg  Narcan given. Pt mental status improved after administration

## 2019-09-17 NOTE — ED Triage Notes (Signed)
BIB EMS from home - Grandson called 911 for pt feeling "weak" Upon EMS arrival pt noted to be hypotensive and lethargic. BP 68/41 upon arrival to ED.

## 2019-09-17 NOTE — ED Provider Notes (Signed)
[facility] EMERGENCY DEPARTMENT Provider Note   CSN: [id number] Arrival date & time: 09/17/19  2308   History Chief Complaint  Patient presents with  . Hypotension    [name] is a 69 y.o. female.  The history is provided by the patient and the EMS personnel.  She has history of hypertension, diabetes, chronic kidney disease and apparently was brought in by ambulance because she was weak and lethargic.  Patient is actually telling me that she came in because her blood pressure was high stating was 572 systolic.  Ambulance actually noted severe hypotension and initial blood pressure on arrival in the ED was 68/41.  She denies any pain.  She denies nausea or vomiting.  She denies diarrhea.  She denies any dyspnea.  EMS apparently also gave her naloxone 1 mg and stated that her mentation improved following that.  Patient states that she had not taken any medication today, but I do not feel she is a reliable historian.  Past Medical History:  Diagnosis Date  . Arthritis   . Diabetes mellitus without complication ([place])   . GERD (gastroesophageal reflux disease)   . H/O hiatal hernia   . Headache(784.0)   . Hyperlipidemia   . Hypertension   . Neuromuscular disorder (HCC)    neuropathy feet   . Sleep apnea    recently diagnosed- wears cpap    Patient Active Problem List   Diagnosis Date Noted  . Cerebral embolism with cerebral infarction 07/08/2019  . ARF (acute renal failure) ([place]) 07/07/2019  . Anemia associated with chronic renal failure 07/07/2019  . Acute bronchiolitis 10/10/2017  . Shortness of breath 10/21/2015  . Acute bronchitis and bronchiolitis 10/21/2015  . Lactic acidosis 10/21/2015  . Benign essential HTN 10/21/2015  . GERD (gastroesophageal reflux disease) 10/21/2015  . Diabetes mellitus with nephropathy ([place]) 10/21/2015  . Acute renal failure superimposed on stage 3 chronic kidney disease ([place]) 10/21/2015  . Chronic arthritis  10/21/2015  . SOB (shortness of breath) 10/21/2015  . OSA (obstructive sleep apnea) 10/11/2012  . Rotator cuff tear, non-traumatic 08/25/2012    Past Surgical History:  Procedure Laterality Date  . ABDOMINAL HYSTERECTOMY     partial  . BACK SURGERY    . BUBBLE STUDY  07/11/2019   Procedure: BUBBLE STUDY;  Surgeon: Dixie Dials, MD;  Location: [facility];  Service: Cardiovascular;;  . CHOLECYSTECTOMY    . COLONOSCOPY     age 27- doesnt know where or who   . left shoulder     x 2 surgeries  . right shoulder     x2  . SHOULDER OPEN ROTATOR CUFF REPAIR Right 08/25/2012   Procedure: ROTATOR CUFF REPAIR SHOULDER OPEN WITH ACROMIOPLASTY;  Surgeon: Tobi Bastos, MD;  Location: [facility];  Service: Orthopedics;  Laterality: Right;  . TEE WITHOUT CARDIOVERSION N/A 07/11/2019   Procedure: TRANSESOPHAGEAL ECHOCARDIOGRAM (TEE);  Surgeon: Dixie Dials, MD;  Location: [facility] ENDOSCOPY;  Service: Cardiovascular;  Laterality: N/A;  . TONSILLECTOMY       OB History    Gravida  4   Para  3   Term  3   Preterm  0   AB  1   Living  3     SAB  0   TAB  1   Ectopic  0   Multiple  0   Live Births  3           Family History  Problem Relation Age of Onset  .  Colon cancer Father   . Colon cancer Paternal Grandfather   . Colon cancer Maternal Grandfather   . Bone cancer Maternal Uncle   . Colon polyps Neg Hx   . Esophageal cancer Neg Hx   . Rectal cancer Neg Hx   . Stomach cancer Neg Hx     Social History   Tobacco Use  . Smoking status: Never Smoker  . Smokeless tobacco: Never Used  Substance Use Topics  . Alcohol use: Yes    Alcohol/week: 0.0 standard drinks    Comment: rarely  . Drug use: No    Home Medications Prior to Admission medications   Medication Sig Start Date End Date Taking? Authorizing Provider  ACCU-CHEK AVIVA PLUS test strip USE BID FOR GLUCOSE TESTING 12/19/14   [provider]  allopurinol (ZYLOPRIM) 100 MG tablet Take 1 tablet (100 mg  total) by mouth daily. 07/12/19 08/11/19  Manuella Ghazi, Pratik D, DO  amLODipine (NORVASC) 10 MG tablet Take 10 mg by mouth daily. 04/15/19   [provider]  atorvastatin (LIPITOR) 20 MG tablet Take 20 mg by mouth daily. 05/03/19   [provider]  cetirizine (ZYRTEC) 10 MG tablet Take 10 mg by mouth daily.  08/31/18   [provider]  cyclobenzaprine (FLEXERIL) 5 MG tablet Take 5 mg by mouth at bedtime.  01/17/19   [provider]  diclofenac sodium (VOLTAREN) 1 % GEL Apply 2 g topically daily.  01/17/19   [provider]  DULoxetine (CYMBALTA) 20 MG capsule Take 20 mg by mouth daily.  01/03/19   [provider]  ferrous sulfate 325 (65 FE) MG tablet Take 1 tablet (325 mg total) by mouth daily with breakfast. 07/12/19 08/11/19  Manuella Ghazi, Pratik D, DO  furosemide (LASIX) 20 MG tablet Take 20 mg by mouth daily.  09/13/15   [provider]  glimepiride (AMARYL) 4 MG tablet Take 4 mg by mouth daily with breakfast.  11/23/18   [provider]  ipratropium-albuterol (DUONEB) 0.5-2.5 (3) MG/3ML SOLN Take 3 mLs by nebulization 2 (two) times daily. Patient taking differently: Take 3 mLs by nebulization every 6 (six) hours as needed (sob and wheezing).  10/23/15   Barton Dubois, MD  JANUVIA 100 MG tablet Take 100 mg by mouth daily. 04/29/19   [provider]  LANTUS SOLOSTAR 100 UNIT/ML Solostar Pen Inject 30 Units into the skin at bedtime.  01/07/19   [provider]  losartan (COZAAR) 25 MG tablet Take 1 tablet (25 mg total) by mouth daily. 10/23/15   Barton Dubois, MD  LYRICA 200 MG capsule Take 200 mg by mouth 3 (three) times daily. 10/08/17   [provider]  metFORMIN (GLUCOPHAGE) 1000 MG tablet Take 1,000 mg by mouth 2 (two) times daily. 07/19/17   [provider]  omeprazole (PRILOSEC) 20 MG capsule Take 20 mg by mouth daily.  10/05/14   [provider]  ondansetron (ZOFRAN) 4 MG tablet Take 1 tablet (4 mg total)  by mouth every 6 (six) hours. Patient taking differently: Take 4 mg by mouth every 8 (eight) hours as needed for nausea or vomiting.  03/20/19   Kendrick, Caitlyn S, PA-C  SYMBICORT 80-4.5 MCG/ACT inhaler Inhale 2 puffs into the lungs 2 (two) times daily. 06/28/19   [provider]  topiramate (TOPAMAX) 100 MG tablet Take 100 mg by mouth daily. 04/29/19   [provider]  TRULICITY 1.61 WR/6.0AV SOPN Take 0.75 mg by mouth every Monday. 06/30/19   [provider]  VENTOLIN HFA 108 (90 Base) MCG/ACT inhaler Inhale 2 puffs into the lungs every 4 (four) hours as needed for wheezing or shortness of breath.  10/01/15   [provider]    Allergies    Patient has no known allergies.  Review of Systems   Review of Systems  All other systems reviewed and are negative.   Physical Exam Updated Vital Signs BP (!) 76/47   Pulse 71   Temp 97.7 F (36.5 C) (Oral)   Resp 18   Ht 5\' 4"  (1.626 m)   Wt 90.7 kg   SpO2 100%   BMI 34.33 kg/m   Physical Exam Vitals and nursing note reviewed.   69 year old female, resting comfortably and in no acute distress. Vital signs are significant for low blood pressure. Oxygen saturation is 100%, which is normal. Head is normocephalic and atraumatic. PERRLA, EOMI. Oropharynx is clear. Neck is nontender and supple without adenopathy or JVD. Back is nontender and there is no CVA tenderness. Lungs are clear without rales, wheezes, or rhonchi. Chest is nontender. Heart has regular rate and rhythm without murmur. Abdomen is soft, flat, nontender without masses or hepatosplenomegaly and peristalsis is normoactive. Extremities have no cyanosis or edema, full range of motion is present. Skin is warm and dry without rash. Neurologic: Mental status is normal, cranial nerves are intact, there are no motor or sensory deficits.  ED Results / Procedures / Treatments   Labs (all labs ordered are listed, but only abnormal results are  displayed) Labs Reviewed  COMPREHENSIVE METABOLIC PANEL  LACTIC ACID, PLASMA  LACTIC ACID, PLASMA  CBC WITH DIFFERENTIAL/PLATELET  RAPID URINE DRUG SCREEN, HOSP PERFORMED  TROPONIN I (HIGH SENSITIVITY)    EKG EKG Interpretation  Date/Time:  Saturday September 17 2019 23:18:36 EDT Ventricular Rate:  73 PR Interval:    QRS Duration: 87 QT Interval:  450 QTC Calculation: 496 R Axis:   40 Text Interpretation: Sinus rhythm Borderline T abnormalities, lateral leads Borderline prolonged QT interval When compared with ECG of 07/06/2019, QT has lengthened Confirmed by Delora Fuel ([id number]) on 09/17/2019 11:34:33 PM   Radiology No results found.  Procedures Procedures  CRITICAL CARE Performed by: Delora Fuel Total critical care time: 90 minutes Critical care time was exclusive of separately billable procedures and treating other patients. Critical care was necessary to treat or prevent imminent or life-threatening deterioration. Critical care was time spent personally by me on the following activities: development of treatment plan with patient and/or surrogate as well as nursing, discussions with consultants, evaluation of patient's response to treatment, examination of patient, obtaining history from patient or surrogate, ordering and performing treatments and interventions, ordering and review of laboratory studies, ordering and review of radiographic studies, pulse oximetry and re-evaluation of patient's condition.  Medications Ordered in ED Medications  sodium chloride 0.9 % bolus 1,000 mL (0 mLs Intravenous Stopped 09/18/19 0053)  lactated ringers bolus 1,000 mL (0 mLs Intravenous Stopped 09/18/19 0159)    ED Course  I have reviewed the triage vital signs and the nursing notes.  Pertinent labs & imaging results that were available during my care of the patient were reviewed by me and considered in my medical decision making (see chart for details).  Hypotension of uncertain cause.  Old  records are reviewed, and she had a similar presentation in January which was associated with acute renal failure and resolved with IV fluids.  During that hospitalization, she had a stroke involving the left occipital  lobe and negative work-up for source of stroke including negative TEE.  She will be given aggressive IV fluids and will check screening labs.  After 2 L of IV fluids, blood pressure has come up into the normal range.  Labs do show elevated lactic acid which is felt to be due to hypotension and not sepsis.  Urinalysis shows no evidence of infection.  Metabolic panel does show evidence of acute kidney injury with creatinine 2.92 compared with baseline of 1.1.  Also, metabolic acidosis consistent with known elevated lactic acid level.  Hemoglobin has increased compared with baseline, felt to be secondary to hemoconcentration.  Case is discussed with Dr. Ouida Sills of family practice service who agrees to admit the patient. MDM Rules/Calculators/A&P  Final Clinical Impression(s) / ED Diagnoses Final diagnoses:  Idiopathic hypotension  Acute kidney injury (nontraumatic) (HCC)  Elevated lactic acid level  Normocytic anemia  Thrombocytopenia ([place])    Rx / DC Orders ED Discharge Orders    None       Delora Fuel, MD 46/19/01 0211

## 2019-09-18 ENCOUNTER — Other Ambulatory Visit: Payer: Self-pay

## 2019-09-18 ENCOUNTER — Emergency Department (HOSPITAL_COMMUNITY): Payer: Medicare Other

## 2019-09-18 DIAGNOSIS — Z800 Family history of malignant neoplasm of digestive organs: Secondary | ICD-10-CM | POA: Diagnosis not present

## 2019-09-18 DIAGNOSIS — N179 Acute kidney failure, unspecified: Secondary | ICD-10-CM | POA: Diagnosis not present

## 2019-09-18 DIAGNOSIS — I5032 Chronic diastolic (congestive) heart failure: Secondary | ICD-10-CM | POA: Diagnosis present

## 2019-09-18 DIAGNOSIS — R531 Weakness: Secondary | ICD-10-CM | POA: Diagnosis not present

## 2019-09-18 DIAGNOSIS — Z66 Do not resuscitate: Secondary | ICD-10-CM | POA: Diagnosis present

## 2019-09-18 DIAGNOSIS — R519 Headache, unspecified: Secondary | ICD-10-CM | POA: Diagnosis present

## 2019-09-18 DIAGNOSIS — I950 Idiopathic hypotension: Secondary | ICD-10-CM | POA: Diagnosis not present

## 2019-09-18 DIAGNOSIS — F329 Major depressive disorder, single episode, unspecified: Secondary | ICD-10-CM | POA: Diagnosis present

## 2019-09-18 DIAGNOSIS — E860 Dehydration: Secondary | ICD-10-CM | POA: Diagnosis present

## 2019-09-18 DIAGNOSIS — N183 Chronic kidney disease, stage 3 unspecified: Secondary | ICD-10-CM | POA: Diagnosis present

## 2019-09-18 DIAGNOSIS — Z79899 Other long term (current) drug therapy: Secondary | ICD-10-CM | POA: Diagnosis not present

## 2019-09-18 DIAGNOSIS — R270 Ataxia, unspecified: Secondary | ICD-10-CM

## 2019-09-18 DIAGNOSIS — Z7951 Long term (current) use of inhaled steroids: Secondary | ICD-10-CM | POA: Diagnosis not present

## 2019-09-18 DIAGNOSIS — D696 Thrombocytopenia, unspecified: Secondary | ICD-10-CM | POA: Diagnosis present

## 2019-09-18 DIAGNOSIS — Z8673 Personal history of transient ischemic attack (TIA), and cerebral infarction without residual deficits: Secondary | ICD-10-CM | POA: Diagnosis not present

## 2019-09-18 DIAGNOSIS — R55 Syncope and collapse: Secondary | ICD-10-CM | POA: Diagnosis not present

## 2019-09-18 DIAGNOSIS — G4733 Obstructive sleep apnea (adult) (pediatric): Secondary | ICD-10-CM | POA: Diagnosis present

## 2019-09-18 DIAGNOSIS — E1122 Type 2 diabetes mellitus with diabetic chronic kidney disease: Secondary | ICD-10-CM | POA: Diagnosis present

## 2019-09-18 DIAGNOSIS — M1990 Unspecified osteoarthritis, unspecified site: Secondary | ICD-10-CM | POA: Diagnosis present

## 2019-09-18 DIAGNOSIS — K219 Gastro-esophageal reflux disease without esophagitis: Secondary | ICD-10-CM | POA: Diagnosis present

## 2019-09-18 DIAGNOSIS — I959 Hypotension, unspecified: Secondary | ICD-10-CM | POA: Diagnosis not present

## 2019-09-18 DIAGNOSIS — Z20822 Contact with and (suspected) exposure to covid-19: Secondary | ICD-10-CM | POA: Diagnosis present

## 2019-09-18 DIAGNOSIS — R030 Elevated blood-pressure reading, without diagnosis of hypertension: Secondary | ICD-10-CM | POA: Diagnosis not present

## 2019-09-18 DIAGNOSIS — E785 Hyperlipidemia, unspecified: Secondary | ICD-10-CM | POA: Diagnosis present

## 2019-09-18 DIAGNOSIS — Z7984 Long term (current) use of oral hypoglycemic drugs: Secondary | ICD-10-CM | POA: Diagnosis not present

## 2019-09-18 DIAGNOSIS — I130 Hypertensive heart and chronic kidney disease with heart failure and stage 1 through stage 4 chronic kidney disease, or unspecified chronic kidney disease: Secondary | ICD-10-CM | POA: Diagnosis present

## 2019-09-18 DIAGNOSIS — E1142 Type 2 diabetes mellitus with diabetic polyneuropathy: Secondary | ICD-10-CM | POA: Diagnosis present

## 2019-09-18 DIAGNOSIS — E872 Acidosis: Secondary | ICD-10-CM | POA: Diagnosis present

## 2019-09-18 LAB — TROPONIN I (HIGH SENSITIVITY)
Troponin I (High Sensitivity): 11 ng/L (ref <18)
Troponin I (High Sensitivity): 12 ng/L (ref <18)

## 2019-09-18 LAB — URINALYSIS, ROUTINE W REFLEX MICROSCOPIC
Bacteria, UA: NONE SEEN
Bilirubin Urine: NEGATIVE
Glucose, UA: NEGATIVE mg/dL
Hgb urine dipstick: NEGATIVE
Ketones, ur: NEGATIVE mg/dL
Leukocytes,Ua: NEGATIVE
Nitrite: NEGATIVE
Protein, ur: 30 mg/dL — AB
Specific Gravity, Urine: 1.017 (ref 1.005–1.030)
pH: 5 (ref 5.0–8.0)

## 2019-09-18 LAB — LACTIC ACID, PLASMA
Lactic Acid, Venous: 3 mmol/L (ref 0.5–1.9)
Lactic Acid, Venous: 2.7 mmol/L (ref 0.5–1.9)
Lactic Acid, Venous: 2.4 mmol/L (ref 0.5–1.9)

## 2019-09-18 LAB — BASIC METABOLIC PANEL
Anion gap: 11 (ref 5–15)
BUN: 29 mg/dL — ABNORMAL HIGH (ref 8–23)
CO2: 18 mmol/L — ABNORMAL LOW (ref 22–32)
Calcium: 9.1 mg/dL (ref 8.9–10.3)
Chloride: 112 mmol/L — ABNORMAL HIGH (ref 98–111)
Creatinine, Ser: 2.29 mg/dL — ABNORMAL HIGH (ref 0.44–1.00)
GFR calc Af Amer: 25 mL/min — ABNORMAL LOW (ref >60)
GFR calc non Af Amer: 22 mL/min — ABNORMAL LOW (ref >60)
Glucose, Bld: 156 mg/dL — ABNORMAL HIGH (ref 70–99)
Potassium: 3.4 mmol/L — ABNORMAL LOW (ref 3.5–5.1)
Sodium: 141 mmol/L (ref 135–145)

## 2019-09-18 LAB — CBC
HCT: 34.7 % — ABNORMAL LOW (ref 36.0–46.0)
Hemoglobin: 10.9 g/dL — ABNORMAL LOW (ref 12.0–15.0)
MCH: 25.1 pg — ABNORMAL LOW (ref 26.0–34.0)
MCHC: 31.4 g/dL (ref 30.0–36.0)
MCV: 80 fL (ref 80.0–100.0)
Platelets: 98 10*3/uL — ABNORMAL LOW (ref 150–400)
RBC: 4.34 MIL/uL (ref 3.87–5.11)
RDW: 23.1 % — ABNORMAL HIGH (ref 11.5–15.5)
WBC: 4.9 10*3/uL (ref 4.0–10.5)
nRBC: 0 % (ref 0.0–0.2)

## 2019-09-18 LAB — COMPREHENSIVE METABOLIC PANEL
ALT: 35 U/L (ref 0–44)
AST: 23 U/L (ref 15–41)
Albumin: 2.7 g/dL — ABNORMAL LOW (ref 3.5–5.0)
Alkaline Phosphatase: 71 U/L (ref 38–126)
Anion gap: 10 (ref 5–15)
BUN: 29 mg/dL — ABNORMAL HIGH (ref 8–23)
CO2: 16 mmol/L — ABNORMAL LOW (ref 22–32)
Calcium: 8.9 mg/dL (ref 8.9–10.3)
Chloride: 111 mmol/L (ref 98–111)
Creatinine, Ser: 2.92 mg/dL — ABNORMAL HIGH (ref 0.44–1.00)
GFR calc Af Amer: 19 mL/min — ABNORMAL LOW (ref >60)
GFR calc non Af Amer: 16 mL/min — ABNORMAL LOW (ref >60)
Glucose, Bld: 231 mg/dL — ABNORMAL HIGH (ref 70–99)
Potassium: 3.5 mmol/L (ref 3.5–5.1)
Sodium: 137 mmol/L (ref 135–145)
Total Bilirubin: 0.9 mg/dL (ref 0.3–1.2)
Total Protein: 6 g/dL — ABNORMAL LOW (ref 6.5–8.1)

## 2019-09-18 LAB — GLUCOSE, CAPILLARY
Glucose-Capillary: 174 mg/dL — ABNORMAL HIGH (ref 70–99)
Glucose-Capillary: 260 mg/dL — ABNORMAL HIGH (ref 70–99)
Glucose-Capillary: 332 mg/dL — ABNORMAL HIGH (ref 70–99)

## 2019-09-18 LAB — CBC WITH DIFFERENTIAL/PLATELET
Abs Immature Granulocytes: 0.02 10*3/uL (ref 0.00–0.07)
Basophils Absolute: 0 10*3/uL (ref 0.0–0.1)
Basophils Relative: 1 %
Eosinophils Absolute: 0.3 10*3/uL (ref 0.0–0.5)
Eosinophils Relative: 5 %
HCT: 35.1 % — ABNORMAL LOW (ref 36.0–46.0)
Hemoglobin: 10.8 g/dL — ABNORMAL LOW (ref 12.0–15.0)
Immature Granulocytes: 0 %
Lymphocytes Relative: 26 %
Lymphs Abs: 1.5 10*3/uL (ref 0.7–4.0)
MCH: 24.8 pg — ABNORMAL LOW (ref 26.0–34.0)
MCHC: 30.8 g/dL (ref 30.0–36.0)
MCV: 80.5 fL (ref 80.0–100.0)
Monocytes Absolute: 0.8 10*3/uL (ref 0.1–1.0)
Monocytes Relative: 13 %
Neutro Abs: 3.2 10*3/uL (ref 1.7–7.7)
Neutrophils Relative %: 55 %
Platelets: 116 10*3/uL — ABNORMAL LOW (ref 150–400)
RBC: 4.36 MIL/uL (ref 3.87–5.11)
RDW: 23.6 % — ABNORMAL HIGH (ref 11.5–15.5)
WBC: 5.7 10*3/uL (ref 4.0–10.5)
nRBC: 0 % (ref 0.0–0.2)

## 2019-09-18 LAB — TSH: TSH: 0.857 u[IU]/mL (ref 0.350–4.500)

## 2019-09-18 LAB — RAPID URINE DRUG SCREEN, HOSP PERFORMED
Amphetamines: NOT DETECTED
Barbiturates: NOT DETECTED
Benzodiazepines: NOT DETECTED
Cocaine: NOT DETECTED
Opiates: NOT DETECTED
Tetrahydrocannabinol: NOT DETECTED

## 2019-09-18 LAB — SARS CORONAVIRUS 2 (TAT 6-24 HRS): SARS Coronavirus 2: NEGATIVE

## 2019-09-18 MED ORDER — ENOXAPARIN SODIUM 30 MG/0.3ML ~~LOC~~ SOLN
30.0000 mg | SUBCUTANEOUS | Status: DC
  Administered 2019-09-18 – 2019-09-19 (×2): 30 mg via SUBCUTANEOUS
  Filled 2019-09-18 (×2): qty 0.3

## 2019-09-18 MED ORDER — IPRATROPIUM-ALBUTEROL 0.5-2.5 (3) MG/3ML IN SOLN: 3.0000 mL | Freq: Four times a day (QID) | RESPIRATORY_TRACT | Status: DC | PRN

## 2019-09-18 MED ORDER — DULOXETINE HCL 30 MG PO CPEP
30.0000 mg | ORAL_CAPSULE | Freq: Every day | ORAL | Status: DC
  Administered 2019-09-18 – 2019-09-19 (×2): 30 mg via ORAL
  Filled 2019-09-18 (×2): qty 1

## 2019-09-18 MED ORDER — ONDANSETRON HCL 4 MG PO TABS: 4.0000 mg | ORAL_TABLET | Freq: Three times a day (TID) | ORAL | Status: DC | PRN

## 2019-09-18 MED ORDER — FERROUS SULFATE 325 (65 FE) MG PO TABS
325.0000 mg | ORAL_TABLET | Freq: Every day | ORAL | Status: DC
  Administered 2019-09-18 – 2019-09-19 (×2): 325 mg via ORAL
  Filled 2019-09-18 (×2): qty 1

## 2019-09-18 MED ORDER — MOMETASONE FURO-FORMOTEROL FUM 100-5 MCG/ACT IN AERO
2.0000 | INHALATION_SPRAY | Freq: Two times a day (BID) | RESPIRATORY_TRACT | Status: DC
  Administered 2019-09-18 – 2019-09-19 (×3): 2 via RESPIRATORY_TRACT
  Filled 2019-09-18: qty 8.8

## 2019-09-18 MED ORDER — ATORVASTATIN CALCIUM 10 MG PO TABS
20.0000 mg | ORAL_TABLET | Freq: Every day | ORAL | Status: DC
  Administered 2019-09-18 – 2019-09-19 (×2): 20 mg via ORAL
  Filled 2019-09-18 (×2): qty 2

## 2019-09-18 MED ORDER — INSULIN GLARGINE 100 UNIT/ML ~~LOC~~ SOLN
15.0000 [IU] | Freq: Every day | SUBCUTANEOUS | Status: DC
  Administered 2019-09-18: 15 [IU] via SUBCUTANEOUS
  Filled 2019-09-18 (×2): qty 0.15

## 2019-09-18 MED ORDER — TRAZODONE HCL 50 MG PO TABS
50.0000 mg | ORAL_TABLET | Freq: Every day | ORAL | Status: DC
  Administered 2019-09-18: 50 mg via ORAL
  Filled 2019-09-18: qty 1

## 2019-09-18 MED ORDER — POLYETHYLENE GLYCOL 3350 17 G PO PACK: 17.0000 g | PACK | Freq: Every day | ORAL | Status: DC | PRN

## 2019-09-18 MED ORDER — PANTOPRAZOLE SODIUM 40 MG PO TBEC
40.0000 mg | DELAYED_RELEASE_TABLET | Freq: Every day | ORAL | Status: DC
  Administered 2019-09-18 – 2019-09-19 (×2): 40 mg via ORAL
  Filled 2019-09-18 (×2): qty 1

## 2019-09-18 MED ORDER — ACETAMINOPHEN 325 MG PO TABS: 650.0000 mg | ORAL_TABLET | Freq: Four times a day (QID) | ORAL | Status: DC | PRN

## 2019-09-18 MED ORDER — AMLODIPINE BESYLATE 10 MG PO TABS
10.0000 mg | ORAL_TABLET | Freq: Every day | ORAL | Status: DC
  Administered 2019-09-18 – 2019-09-19 (×2): 10 mg via ORAL
  Filled 2019-09-18 (×2): qty 1

## 2019-09-18 MED ORDER — DICLOFENAC SODIUM 1 % EX GEL: 2.0000 g | Freq: Four times a day (QID) | CUTANEOUS | Status: DC | PRN

## 2019-09-18 MED ORDER — LACTATED RINGERS IV BOLUS
1000.0000 mL | Freq: Once | INTRAVENOUS | Status: AC
  Administered 2019-09-18: 1000 mL via INTRAVENOUS

## 2019-09-18 MED ORDER — ACETAMINOPHEN 650 MG RE SUPP: 650.0000 mg | Freq: Four times a day (QID) | RECTAL | Status: DC | PRN

## 2019-09-18 MED ORDER — BOOST / RESOURCE BREEZE PO LIQD CUSTOM
1.0000 | Freq: Three times a day (TID) | ORAL | Status: DC
  Administered 2019-09-18 – 2019-09-19 (×4): 1 via ORAL

## 2019-09-18 NOTE — Progress Notes (Signed)
Patient arrived to 3W-32 from ER.  [place] [phone], F who is alert and oriented x2-3, she knew the current month was April and year 2021, but unsure of day of week and unsure of date and Easter holiday.  Patient stood and pivoted from stretcher and assisted with walker to restroom.  Patient oriented to room and use of call light.  Patient confirms her DNR status and purple arm band applied with charge RN witness to pt wishes. VSS.  Weight 78.1kg via bed scale. Call light in reach.    09/18/19 0745  Vitals  Temp 97.9 F (36.6 C)  Temp Source Oral  BP Location Left Arm  BP Method Automatic  Patient Position (if appropriate) Sitting  Pulse Rate 86  Pulse Rate Source Monitor  ECG Heart Rate 85  Cardiac Rhythm NSR  Resp 14  Level of Consciousness  Level of Consciousness Alert  Oxygen Therapy  SpO2 98 %  O2 Device Room Air  Patient Activity (if Appropriate) In bed  Pulse Oximetry Type Intermittent  Pain Assessment  Pain Scale 0-10  Pain Score 0  MEWS Score  MEWS Temp 0  MEWS Systolic 0  MEWS Pulse 0  MEWS RR 0  MEWS LOC 0  MEWS Score 0  MEWS Score Color Green

## 2019-09-18 NOTE — ED Notes (Signed)
Breakfast Ordered 

## 2019-09-18 NOTE — ED Notes (Signed)
Admitting @ bedside

## 2019-09-18 NOTE — ED Notes (Signed)
Report attempted, RN to call back for report when available

## 2019-09-18 NOTE — ED Notes (Signed)
Pt transported to MRI 

## 2019-09-18 NOTE — Plan of Care (Signed)
  Problem: Clinical Measurements: Goal: Ability to maintain clinical measurements within normal limits will improve Outcome: Progressing Goal: Diagnostic test results will improve Outcome: Progressing   Problem: Coping: Goal: Level of anxiety will decrease Outcome: Progressing   Problem: Skin Integrity: Goal: Risk for impaired skin integrity will decrease Outcome: Progressing

## 2019-09-18 NOTE — Evaluation (Signed)
Occupational Therapy Evaluation Patient Details Name: [name] MRN: [id number] DOB: 18-Aug-1950 Today's Date: 09/18/2019    History of Present Illness Patient is a 69 y/o female who presents with dizziness, weakness, and syncope. Found to have lactic acidosis and hypotension. PMH includes CVA, DM, HTN, CKD stage III, CHF, OSA.   Clinical Impression   PTA pt living alone with intermittent supervision from grandson. She reports using a RW, shower chair, and BSC consistently at baseline. At time of eval, pt presents with cognitive deficits in problem solving, attention and safety awareness that impacts BADL safety. Pt completed bed mobility at supervision level and sit <> stands with min A and RW. Increased cues and repetition needed for basic mobility tasks. Pt able to complete household level functional mobility with RW at min guard level at very slow and cautious pace. At this time recommend HHOT to continue BADL safety progression in home environment. Will continue to follow per POC listed below.     Follow Up Recommendations  Home health OT;Supervision/Assistance - 24 hour    Equipment Recommendations  None recommended by OT    Recommendations for Other Services       Precautions / Restrictions Precautions Precautions: Fall Restrictions Weight Bearing Restrictions: No      Mobility Bed Mobility Overal bed mobility: Needs Assistance Bed Mobility: Supine to Sit     Supine to sit: Supervision;HOB elevated     General bed mobility comments: Use of rail.  Transfers Overall transfer level: Needs assistance Equipment used: Rolling walker (2 wheeled) Transfers: Sit to/from Stand Sit to Stand: Min assist         General transfer comment: pt attempted to power up to standing with both hands on walker at mod A. Cued for onehand on bed, transfer improving to min A    Balance Overall balance assessment: Mild deficits observed, not formally tested                                          ADL either performed or assessed with clinical judgement   ADL Overall ADL's : Needs assistance/impaired Eating/Feeding: Set up;Sitting   Grooming: Set up;Sitting   Upper Body Bathing: Set up;Sitting   Lower Body Bathing: Minimal assistance;Sit to/from stand;Sitting/lateral leans   Upper Body Dressing : Sitting   Lower Body Dressing: Minimal assistance;Sit to/from stand;Sitting/lateral leans   Toilet Transfer: Min guard;Minimal assistance;Ambulation;Regular Toilet;Anterior/posterior;RW Toilet Transfer Details (indicate cue type and reason): pt with slow, cautious functional mobility. increased assist for steadying and min A to power up off of toilet Toileting- Clothing Manipulation and Hygiene: Minimal assistance;Sit to/from stand   Tub/ Shower Transfer: Minimal assistance;Ambulation;Shower seat   Functional mobility during ADLs: Min guard;Minimal assistance;Cueing for safety;Rolling walker       Vision Baseline Vision/History: Wears glasses Patient Visual Report: No change from baseline Vision Assessment?: No apparent visual deficits     Perception     Praxis      Pertinent Vitals/Pain Pain Assessment: No/denies pain     Hand Dominance Right   Extremity/Trunk Assessment Upper Extremity Assessment Upper Extremity Assessment: Generalized weakness   Lower Extremity Assessment Lower Extremity Assessment: Defer to PT evaluation       Communication Communication Communication: No difficulties   Cognition Arousal/Alertness: Awake/alert Behavior During Therapy: Flat affect Overall Cognitive Status: No family/caregiver present to determine baseline cognitive functioning Area of Impairment: Attention;Memory;Problem solving;Safety/judgement  Current Attention Level: Sustained Memory: Decreased short-term memory   Safety/Judgement: Decreased awareness of deficits   Problem Solving: Slow  processing;Requires verbal cues General Comments: Pt overall a poor historian. Needing increased time and cueing to perform simple tasks   General Comments  VSS on RA.    Exercises     Shoulder Instructions      Home Living Family/patient expects to be discharged to:: Private residence Living Arrangements: Alone Available Help at Discharge: Family Type of Home: House Home Access: Level entry     Home Layout: One level     Bathroom Shower/Tub: Teacher, early years/pre: Standard     Home Equipment: Grab bars - tub/shower;Walker - 2 wheels;Walker - 4 wheels;Cane - single point;Shower seat   Additional Comments: pt reports her grandson is "in and out" of the house, has additional family in the area that check on her      Prior Functioning/Environment Level of Independence: Independent with assistive device(s)        Comments: Reports using RW for ambulation; grandson assists with IADLs, grocery shopping and driving. Reports no falls however note from 2 months ago reports falls.        OT Problem List: Decreased strength;Decreased knowledge of use of DME or AE;Decreased activity tolerance;Decreased cognition;Obesity;Decreased safety awareness      OT Treatment/Interventions: Self-care/ADL training;Therapeutic exercise;Patient/family education;Balance training;Energy conservation;Therapeutic activities;DME and/or AE instruction    OT Goals(Current goals can be found in the care plan section) Acute Rehab OT Goals Patient Stated Goal: to go home Time For Goal Achievement: 10/02/19 Potential to Achieve Goals: Good  OT Frequency: Min 2X/week   Barriers to D/C:            Co-evaluation              AM-PAC OT "6 Clicks" Daily Activity     Outcome Measure Help from another person eating meals?: None Help from another person taking care of personal grooming?: None Help from another person toileting, which includes using toliet, bedpan, or urinal?: A  Little Help from another person bathing (including washing, rinsing, drying)?: A Little Help from another person to put on and taking off regular upper body clothing?: None Help from another person to put on and taking off regular lower body clothing?: A Little 6 Click Score: 21   End of Session Equipment Utilized During Treatment: Rolling walker Nurse Communication: Mobility status  Activity Tolerance: Patient tolerated treatment well Patient left: in bed;with call bell/phone within reach;with bed alarm set  OT Visit Diagnosis: Unsteadiness on feet (R26.81);Other abnormalities of gait and mobility (R26.89);Muscle weakness (generalized) (M62.81);Other symptoms and signs involving cognitive function                Time: 6160-7371 OT Time Calculation (min): 13 min Charges:  OT General Charges $OT Visit: 1 Visit OT Evaluation $OT Eval Moderate Complexity: Singer, MSOT, OTR/L [facility] Office Number: [phone] Pager: [phone]  Zenovia Jarred 09/18/2019, 5:44 PM

## 2019-09-18 NOTE — ED Notes (Signed)
Went to round on patient still in MRI

## 2019-09-18 NOTE — H&P (Signed)
[facility] Admission History and Physical Service Pager: [phone]  Patient name: [name] Medical record number: [id number] Date of birth: Oct 20, 1950 Age: 69 y.o. Gender: female  Primary Care Provider: [name], FNP Consultants: none Code Status: DNR  Chief Complaint: weakness and passing out  Assessment and Plan: [name] is a 69 y.o. 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  Confusion and gait abnormality [name] seem to have worsening symptoms for several months but acutely worsened in the past day with confusion possible syncopal episodes. When she presented to the emergency room, her vitals were remarkable for hypotension with a blood pressure of 88/52.  Her labs are remarkable for a creatinine of 2.9 (baseline 1.1), lactic acid 3.0.  The differential for her confusion and gait abnormality includes dehydration, new CVA, infection, intoxication.  UDS was entirely negative.  The her lactic acid is elevated, suspicion for infection remains low as her history and physical do not show any indications of infection and her white blood cell count remains unremarkable, no evidence of infection noted on UA or chest x-ray.  New CVA was ruled out with MRI brain.  Confusion and vasovagal syncope seem most likely to be related to dehydration due to her poor p.o. intake for the past months.  Her blood pressures responded nicely to a bolus of 2 L.  We will hold off on further IV rehydration at this time due to known heart failure and will encourage p.o. intake. -Admit to MedSurg, attending Dr. Erin Levine -Follow-up orthostatic vitals -Follow-up TSH -PT/OT consulted  Lactic acidosis No clear signs of infectious process or significant hyperglycemia.  Most likely related to her severe  dehydration as noted above.  Lactic acid is downtrending following fluid bolus.  We will continue to monitor.  CVA 07/06/2019 She is admitted to the hospital on 6/83/4196 for an embolic stroke.  She was discharged from the hospital on aspirin and Plavix for the 3 weeks after which time she should continue with aspirin monotherapy.  MR brain today ruled out a new stroke. -Continue aspirin  Hypotension, improved Blood pressure on admission was notable for 88/52.  Likely secondary to dehydration as noted above.  She is medicated for hypertension and her home medications include losartan, Lasix, amlodipine.  Lasix and losartan have been held due to her AKI.  We will continue amlodipine as her blood pressure seems to have recovered nicely with her fluid bolus. -Amlodipine 10 mg daily -Follow-up orthostatic vitals as above -Monitor creatinine  Diabetes, type II Home medications include glimepiride, Januvia, Lantus, Metformin, Trulicity.  CBG on admission was elevated to 231.  Mild elevation in blood glucose is unlikely contributing to her current hospitalization. -Glargine 15 units daily (half home dose) -Continue Januvia -Continue glimepiride -Hold metformin due to AKI -Hold Trulicity for now, next dose 4/5  Chronic bronchitis Home medication includes DuoNebs as needed and Symbicort twice daily. -DuoNebs as needed -Dulera twice daily  CAD -Continue atorvastatin 20 mg  Anxiety/sleep Home medications include Cymbalta and trazodone.  It appears that she is been having significant depression following the death of her daughter which is likely contributed to her current dehydration and hospitalization.  She would likely benefit from close follow-up to help her through this grieving process. -Continue Cymbalta -Continue trazodone nightly  Anemia, iron deficiency Admission labs show hemoglobin of 10.8 which is  an improvement from her previous measurement although is likely hemoconcentrated.   Plan to monitor closely and continue iron supplement. -Continue iron supplements  Chronic pain She is previously been seen by chronic pain specialist and been prescribed opioids.  During her recent hospitalization this past January, she was taken off her opiate medication and has done well without it.  Due to the significant side effect profile of this pain medication, she expressed an interest in moving forward without opioids for pain control. -Voltaren gel 4 times daily as needed -Tylenol as needed  FEN/GI: Carb modified diet Prophylaxis: Lovenox  Disposition: Discharge home pending PT/OT evaluation  History of Present Illness:  [name] is a 69 y.o. 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  [name] reports that she has been feeling weak and dizzy for 1 day and that she had high blood sugar.  She came to the emergency room today at the behest of her grandson who is concerned about her high blood sugar per [name]'s report.  [name] is alert and oriented x4 although seem to have trouble providing any of the details of her illness apart from what is noted above.  Her grandson was called to provide additional information.  [name], the grandson, reports that [name] has been eating and drinking very little for the past 2 months since the death of her daughter.  It is been very difficult to get her to eat anything because she will frequently throw food back up and has very little desire to eat.  He believes that she has experienced a roughly 40 pound weight loss.  The past 3 weeks have been particularly challenging as her NB/NB vomiting seems to have worsened.  He grew particularly concerned today when she demonstrated significant weakness and trouble walking.  He checked her blood sugar which she noted was mildly elevated in the 200s and  [name] recommended that she should have some candy to help her high blood sugar.  This is very unusual for her because she understands her blood sugar control and that eating candy would not fix a high blood sugar.  In addition to this confusion, she had extreme difficulty walking with her walker today.  He called EMS to come pick her up and have her evaluated in the ED because he was concerned for a new stroke.  When EMS arrived, they noted that her blood pressure was incredibly low and tried to help her walk with her walker.  During these attempts to walk, she ended up passing out without injury multiple times.  During these episodes while she passed out, the grandson noted left-sided facial drooping.  With all of these symptoms, the grandson grew increasingly concerned for possible new stroke.  In the ED, she was found to have a blood pressure of 88/52 and given 2 L of fluid boluses.  Review Of Systems: Per HPI with the following additions:   Review of Systems  Constitutional: Negative for diaphoresis and fever.  HENT: Negative for sore throat.   Eyes: Negative for blurred vision.  Respiratory: Negative for cough, sputum production and shortness of breath.   Cardiovascular: Negative for chest pain, palpitations and leg swelling.  Gastrointestinal: Negative for abdominal pain, constipation, diarrhea, nausea and vomiting.  Genitourinary: Negative for dysuria and frequency.  Musculoskeletal: Negative for myalgias.  Skin: Negative for rash.  Neurological: Positive for weakness. Negative for tremors,  speech change, focal weakness and headaches.    Patient Active Problem List   Diagnosis Date Noted  . Ataxia 09/18/2019  . Cerebral embolism with cerebral infarction 07/08/2019  . ARF (acute renal failure) ([place]) 07/07/2019  . Anemia associated with chronic renal failure 07/07/2019  . Acute bronchiolitis 10/10/2017  . Shortness of breath 10/21/2015  . Acute bronchitis and bronchiolitis  10/21/2015  . Lactic acidosis 10/21/2015  . Benign essential HTN 10/21/2015  . GERD (gastroesophageal reflux disease) 10/21/2015  . Diabetes mellitus with nephropathy ([place]) 10/21/2015  . Acute renal failure superimposed on stage 3 chronic kidney disease ([place]) 10/21/2015  . Chronic arthritis 10/21/2015  . SOB (shortness of breath) 10/21/2015  . OSA (obstructive sleep apnea) 10/11/2012  . Rotator cuff tear, non-traumatic 08/25/2012    Past Medical History: Past Medical History:  Diagnosis Date  . Arthritis   . Diabetes mellitus without complication ([place])   . GERD (gastroesophageal reflux disease)   . H/O hiatal hernia   . Headache(784.0)   . Hyperlipidemia   . Hypertension   . Neuromuscular disorder ([facility])    neuropathy feet   . Sleep apnea    recently diagnosed- wears cpap    Past Surgical History: Past Surgical History:  Procedure Laterality Date  . ABDOMINAL HYSTERECTOMY     partial  . BACK SURGERY    . BUBBLE STUDY  07/11/2019   Procedure: BUBBLE STUDY;  Surgeon: Dixie Dials, MD;  Location: [facility];  Service: Cardiovascular;;  . CHOLECYSTECTOMY    . COLONOSCOPY     age 67- doesnt know where or who   . left shoulder     x 2 surgeries  . right shoulder     x2  . SHOULDER OPEN ROTATOR CUFF REPAIR Right 08/25/2012   Procedure: ROTATOR CUFF REPAIR SHOULDER OPEN WITH ACROMIOPLASTY;  Surgeon: Tobi Bastos, MD;  Location: [facility];  Service: Orthopedics;  Laterality: Right;  . TEE WITHOUT CARDIOVERSION N/A 07/11/2019   Procedure: TRANSESOPHAGEAL ECHOCARDIOGRAM (TEE);  Surgeon: Dixie Dials, MD;  Location: [facility];  Service: Cardiovascular;  Laterality: N/A;  . TONSILLECTOMY      Social History: Social History   Tobacco Use  . Smoking status: Never Smoker  . Smokeless tobacco: Never Used  Substance Use Topics  . Alcohol use: Yes    Alcohol/week: 0.0 standard drinks    Comment: rarely  . Drug use: No   Additional social history: lives with grandson.  Recently lost her daughter.  Please also refer to relevant sections of EMR.  Family History: Family History  Problem Relation Age of Onset  . Colon cancer Father   . Colon cancer Paternal Grandfather   . Colon cancer Maternal Grandfather   . Bone cancer Maternal Uncle   . Colon polyps Neg Hx   . Esophageal cancer Neg Hx   . Rectal cancer Neg Hx   . Stomach cancer Neg Hx      Allergies and Medications: No Known Allergies No current facility-administered medications on file prior to encounter.   Current Outpatient Medications on File Prior to Encounter  Medication Sig Dispense Refill  . amLODipine (NORVASC) 10 MG tablet Take 10 mg by mouth daily.    Marland Kitchen atorvastatin (LIPITOR) 20 MG tablet Take 20 mg by mouth daily.    . cetirizine (ZYRTEC) 10 MG tablet Take 10 mg by mouth daily.     . cyclobenzaprine (FLEXERIL) 5 MG tablet Take 5 mg by mouth at bedtime.     . diclofenac sodium (  VOLTAREN) 1 % GEL Apply 2 g topically daily as needed (pain).     . DULoxetine (CYMBALTA) 30 MG capsule Take 30 mg by mouth daily.    . ferrous sulfate 325 (65 FE) MG tablet Take 1 tablet (325 mg total) by mouth daily with breakfast. 30 tablet 0  . furosemide (LASIX) 20 MG tablet Take 20 mg by mouth daily.   2  . glimepiride (AMARYL) 4 MG tablet Take 4 mg by mouth daily with breakfast.     . ipratropium-albuterol (DUONEB) 0.5-2.5 (3) MG/3ML SOLN Take 3 mLs by nebulization 2 (two) times daily. (Patient taking differently: Take 3 mLs by nebulization every 6 (six) hours as needed (sob and wheezing). ) 360 mL 1  . JANUVIA 100 MG tablet Take 100 mg by mouth daily.    Marland Kitchen LANTUS SOLOSTAR 100 UNIT/ML Solostar Pen Inject 30 Units into the skin at bedtime.     Marland Kitchen losartan (COZAAR) 25 MG tablet Take 1 tablet (25 mg total) by mouth daily. 30 tablet 1  . LYRICA 200 MG capsule Take 200 mg by mouth 3 (three) times daily.  1  . metFORMIN (GLUCOPHAGE) 1000 MG tablet Take 1,000 mg by mouth 2 (two) times daily.  0  . omeprazole  (PRILOSEC) 20 MG capsule Take 20 mg by mouth daily.     . ondansetron (ZOFRAN) 4 MG tablet Take 1 tablet (4 mg total) by mouth every 6 (six) hours. (Patient taking differently: Take 4 mg by mouth every 8 (eight) hours as needed for nausea or vomiting. ) 12 tablet 0  . SYMBICORT 80-4.5 MCG/ACT inhaler Inhale 2 puffs into the lungs 2 (two) times daily.    Marland Kitchen tiZANidine (ZANAFLEX) 4 MG tablet Take 4 mg by mouth 2 (two) times daily.    Marland Kitchen topiramate (TOPAMAX) 100 MG tablet Take 100 mg by mouth daily.    . traZODone (DESYREL) 50 MG tablet Take 50 mg by mouth at bedtime.    . TRULICITY 1.06 YI/9.4WN SOPN Take 0.75 mg by mouth every Monday.    . VENTOLIN HFA 108 (90 Base) MCG/ACT inhaler Inhale 2 puffs into the lungs every 4 (four) hours as needed for wheezing or shortness of breath.   5  . ACCU-CHEK AVIVA PLUS test strip USE BID FOR GLUCOSE TESTING  11  . allopurinol (ZYLOPRIM) 100 MG tablet Take 1 tablet (100 mg total) by mouth daily. (Patient not taking: Reported on 09/18/2019) 30 tablet 0    Objective: BP 117/66   Pulse 79   Temp 97.7 F (36.5 C) (Oral)   Resp 16   Ht 5\' 4"  (1.626 m)   Wt 90.7 kg   SpO2 100%   BMI 34.33 kg/m   Exam: Physical Exam Constitutional:      Appearance: Normal appearance. She is obese.     Comments: Oriented x4  HENT:     Head: Normocephalic and atraumatic.     Nose: Nose normal. No congestion or rhinorrhea.     Mouth/Throat:     Mouth: Mucous membranes are dry.     Pharynx: Oropharynx is clear. No oropharyngeal exudate or posterior oropharyngeal erythema.  Eyes:     General:        Right eye: No discharge.        Left eye: No discharge.     Extraocular Movements: Extraocular movements intact.     Conjunctiva/sclera: Conjunctivae normal.     Pupils: Pupils are equal, round, and reactive to light.  Cardiovascular:  Rate and Rhythm: Normal rate and regular rhythm.     Pulses: Normal pulses.     Heart sounds: Murmur present.  Pulmonary:     Effort:  Pulmonary effort is normal.     Breath sounds: Normal breath sounds.  Abdominal:     General: Abdomen is flat. Bowel sounds are normal. There is no distension.     Palpations: Abdomen is soft.     Tenderness: There is no abdominal tenderness. There is no guarding.  Musculoskeletal:        General: No swelling, tenderness, deformity or signs of injury. Normal range of motion.     Cervical back: Normal range of motion and neck supple.     Right lower leg: No edema.     Left lower leg: No edema.  Skin:    General: Skin is warm and dry.     Capillary Refill: Capillary refill takes less than 2 seconds.  Neurological:     Mental Status: She is alert and oriented to person, place, and time.     Cranial Nerves: No cranial nerve deficit.     Sensory: No sensory deficit.     Motor: No weakness.     Gait: Gait abnormal.  Psychiatric:        Mood and Affect: Mood normal.        Behavior: Behavior normal.      Labs and Imaging: CBC BMET  Recent Labs  Lab 09/17/19 2341  WBC 5.7  HGB 10.8*  HCT 35.1*  PLT 116*   Recent Labs  Lab 09/17/19 2341  NA 137  K 3.5  CL 111  CO2 16*  BUN 29*  CREATININE 2.92*  GLUCOSE 231*  CALCIUM 8.9     DG Chest Port 1 View  Result Date: 09/18/2019 CLINICAL DATA:  Syncopal episode and weakness EXAM: PORTABLE CHEST 1 VIEW COMPARISON:  07/06/2019 FINDINGS: The heart size and mediastinal contours are within normal limits. Both lungs are clear. The visualized skeletal structures are unremarkable. IMPRESSION: No active disease. Electronically Signed   By: Inez Catalina M.D.   On: 09/18/2019 02:35     Matilde Haymaker, MD 09/18/2019, 4:20 AM PGY-2, [place] Intern pager: [phone], text pages welcome

## 2019-09-18 NOTE — Evaluation (Signed)
Physical Therapy Evaluation Patient Details Name: [name] MRN: [id number] DOB: 04/18/1951 Today's Date: 09/18/2019   History of Present Illness  Patient is a 69 y/o female who presents with dizziness, weakness, and syncope. Found to have lactic acidosis and hypotension. PMH includes CVA, DM, HTN, CKD stage III, CHF, OSA.  Clinical Impression  Patient presents with generalized weakness, impaired balance, impaired cognition, decreased activity tolerance and impaired mobility s/p above. Pt lives alone and reports being Mod I with ADLs and walking with RW PTA. Has a grandson and family that check on her and assist with IADLs/driving. Pt not the best historian today. Pt following 1-2 step commands but difficulty with attention. Requires Mod A to stand from EOB using momentum and Min guard for ambulation with use of RW. Reports no dizziness this session and VSS. Recommend initial 24/7 supervision especially for OOB mobility as pt is a fall risk and not at functional baseline per report. Will follow acutely to maximize independence and mobility prior to return home.    Follow Up Recommendations Home health PT;Supervision for mobility/OOB;Supervision/Assistance - 24 hour    Equipment Recommendations  None recommended by PT    Recommendations for Other Services       Precautions / Restrictions Precautions Precautions: Fall Restrictions Weight Bearing Restrictions: No      Mobility  Bed Mobility Overal bed mobility: Needs Assistance Bed Mobility: Supine to Sit     Supine to sit: Supervision;HOB elevated     General bed mobility comments: Use of rail.  Transfers Overall transfer level: Needs assistance Equipment used: Rolling walker (2 wheeled) Transfers: Sit to/from Stand Sit to Stand: Mod assist         General transfer comment: Assist to power to standing with pt pulling up on RW despite cues for hand placement. Use of momentum and increased difficulty  standing.  Ambulation/Gait Ambulation/Gait assistance: Min guard Gait Distance (Feet): 80 Feet Assistive device: Rolling walker (2 wheeled) Gait Pattern/deviations: Step-through pattern;Decreased stride length Gait velocity: decreased   General Gait Details: Slow, guarded gait with use of RW; no dizziness.  Stairs            Wheelchair Mobility    Modified Rankin (Stroke Patients Only)       Balance Overall balance assessment: Mild deficits observed, not formally tested                                           Pertinent Vitals/Pain Pain Assessment: No/denies pain    Home Living Family/patient expects to be discharged to:: Private residence Living Arrangements: Alone Available Help at Discharge: Family Type of Home: House Home Access: Level entry   Entrance Stairs-Number of Steps: (P) 17 Home Layout: One level Home Equipment: Grab bars - tub/shower;Walker - 2 wheels;Walker - 4 wheels;Cane - single point Additional Comments: pt reports her grandson is "in and out" of the house, has additional family in the area that check on her    Prior Function Level of Independence: Independent with assistive device(s)         Comments: Reports using RW for ambulation; grandson assists with IADLs, grocery shopping and driving. Reports no falls however note from 2 months ago reports falls.     Hand Dominance   Dominant Hand: Right    Extremity/Trunk Assessment   Upper Extremity Assessment Upper Extremity Assessment: Defer to OT evaluation  Lower Extremity Assessment Lower Extremity Assessment: Generalized weakness(but functional; sensation WFL per report)       Communication   Communication: No difficulties  Cognition Arousal/Alertness: Awake/alert Behavior During Therapy: WFL for tasks assessed/performed Overall Cognitive Status: No family/caregiver present to determine baseline cognitive functioning                                  General Comments: Difficulty with sustaining attention; tangential with speech at times. Follows 1 step commands consistently. Not the best historian.      General Comments General comments (skin integrity, edema, etc.): VSS on RA.    Exercises     Assessment/Plan    PT Assessment Patient needs continued PT services  PT Problem List Decreased strength;Decreased mobility;Decreased balance;Decreased cognition       PT Treatment Interventions Therapeutic activities;Gait training;Therapeutic exercise;Patient/family education;Balance training;Functional mobility training    PT Goals (Current goals can be found in the Care Plan section)  Acute Rehab PT Goals Patient Stated Goal: to go home PT Goal Formulation: With patient Time For Goal Achievement: 10/02/19 Potential to Achieve Goals: Good    Frequency Min 3X/week   Barriers to discharge Decreased caregiver support lives alone    Co-evaluation               AM-PAC PT "6 Clicks" Mobility  Outcome Measure Help needed turning from your back to your side while in a flat bed without using bedrails?: None Help needed moving from lying on your back to sitting on the side of a flat bed without using bedrails?: A Little Help needed moving to and from a bed to a chair (including a wheelchair)?: A Little Help needed standing up from a chair using your arms (e.g., wheelchair or bedside chair)?: A Lot Help needed to walk in hospital room?: A Little Help needed climbing 3-5 steps with a railing? : A Lot 6 Click Score: 17    End of Session Equipment Utilized During Treatment: Gait belt Activity Tolerance: Patient tolerated treatment well;Patient limited by fatigue Patient left: in bed;with call bell/phone within reach;with bed alarm set(sitting EOB) Nurse Communication: Mobility status PT Visit Diagnosis: Muscle weakness (generalized) (M62.81);Difficulty in walking, not elsewhere classified (R26.2)    Time:  5035-4656 PT Time Calculation (min) (ACUTE ONLY): 15 min   Charges:   PT Evaluation $PT Eval Moderate Complexity: 1 Mod          Marisa Severin, PT, DPT Acute Rehabilitation Services Pager [phone] Office [place] 09/18/2019, 3:11 PM

## 2019-09-19 DIAGNOSIS — I950 Idiopathic hypotension: Secondary | ICD-10-CM

## 2019-09-19 LAB — CBC
HCT: 33.6 % — ABNORMAL LOW (ref 36.0–46.0)
Hemoglobin: 10.5 g/dL — ABNORMAL LOW (ref 12.0–15.0)
MCH: 24.7 pg — ABNORMAL LOW (ref 26.0–34.0)
MCHC: 31.3 g/dL (ref 30.0–36.0)
MCV: 79.1 fL — ABNORMAL LOW (ref 80.0–100.0)
Platelets: 104 10*3/uL — ABNORMAL LOW (ref 150–400)
RBC: 4.25 MIL/uL (ref 3.87–5.11)
RDW: 23.1 % — ABNORMAL HIGH (ref 11.5–15.5)
WBC: 5.1 10*3/uL (ref 4.0–10.5)
nRBC: 0 % (ref 0.0–0.2)

## 2019-09-19 LAB — GLUCOSE, CAPILLARY
Glucose-Capillary: 165 mg/dL — ABNORMAL HIGH (ref 70–99)
Glucose-Capillary: 210 mg/dL — ABNORMAL HIGH (ref 70–99)

## 2019-09-19 LAB — PHOSPHORUS: Phosphorus: 2.2 mg/dL — ABNORMAL LOW (ref 2.5–4.6)

## 2019-09-19 LAB — BASIC METABOLIC PANEL
Anion gap: 13 (ref 5–15)
BUN: 23 mg/dL (ref 8–23)
CO2: 16 mmol/L — ABNORMAL LOW (ref 22–32)
Calcium: 8.8 mg/dL — ABNORMAL LOW (ref 8.9–10.3)
Chloride: 111 mmol/L (ref 98–111)
Creatinine, Ser: 1.6 mg/dL — ABNORMAL HIGH (ref 0.44–1.00)
GFR calc Af Amer: 39 mL/min — ABNORMAL LOW (ref >60)
GFR calc non Af Amer: 33 mL/min — ABNORMAL LOW (ref >60)
Glucose, Bld: 212 mg/dL — ABNORMAL HIGH (ref 70–99)
Potassium: 2.8 mmol/L — ABNORMAL LOW (ref 3.5–5.1)
Sodium: 140 mmol/L (ref 135–145)

## 2019-09-19 LAB — MAGNESIUM: Magnesium: 1 mg/dL — ABNORMAL LOW (ref 1.7–2.4)

## 2019-09-19 LAB — PROTIME-INR
INR: 1.2 (ref 0.8–1.2)
Prothrombin Time: 14.6 seconds (ref 11.4–15.2)

## 2019-09-19 MED ORDER — ASPIRIN 81 MG PO CHEW: 81.0000 mg | CHEWABLE_TABLET | Freq: Every day | ORAL | 0 refills | Status: AC

## 2019-09-19 MED ORDER — MAGNESIUM SULFATE 2 GM/50ML IV SOLN
2.0000 g | Freq: Once | INTRAVENOUS | Status: AC
  Administered 2019-09-19: 2 g via INTRAVENOUS
  Filled 2019-09-19: qty 50

## 2019-09-19 MED ORDER — GLUCERNA SHAKE PO LIQD: 237.0000 mL | Freq: Three times a day (TID) | ORAL | 1 refills | Status: AC

## 2019-09-19 MED ORDER — GLUCERNA SHAKE PO LIQD: 237.0000 mL | Freq: Three times a day (TID) | ORAL | Status: DC

## 2019-09-19 MED ORDER — ASPIRIN 81 MG PO CHEW
81.0000 mg | CHEWABLE_TABLET | Freq: Every day | ORAL | Status: DC
  Administered 2019-09-19: 81 mg via ORAL
  Filled 2019-09-19: qty 1

## 2019-09-19 MED ORDER — ADULT MULTIVITAMIN W/MINERALS CH: 1.0000 | ORAL_TABLET | Freq: Every day | ORAL | Status: DC

## 2019-09-19 MED ORDER — POTASSIUM CHLORIDE CRYS ER 20 MEQ PO TBCR
40.0000 meq | EXTENDED_RELEASE_TABLET | ORAL | Status: AC
  Administered 2019-09-19 (×2): 40 meq via ORAL
  Filled 2019-09-19 (×2): qty 2

## 2019-09-19 MED ORDER — POTASSIUM & SODIUM PHOSPHATES 280-160-250 MG PO PACK
1.0000 | PACK | Freq: Three times a day (TID) | ORAL | Status: DC
  Administered 2019-09-19: 1 via ORAL
  Filled 2019-09-19 (×4): qty 1

## 2019-09-19 NOTE — Progress Notes (Signed)
Inpatient Diabetes Program Recommendations  AACE/ADA: New Consensus Statement on Inpatient Glycemic Control  Target Ranges:  Prepandial:   less than 140 mg/dL      Peak postprandial:   less than 180 mg/dL (1-2 hours)      Critically ill patients:  140 - 180 mg/dL   Results for [name], [name] (MRN [id number]) as of 09/19/2019 09:29  Ref. Range 09/18/2019 12:53 09/18/2019 16:21 09/18/2019 22:21 09/19/2019 06:48  Glucose-Capillary Latest Ref Range: 70 - 99 mg/dL 174 (H) 260 (H) 332 (H) 165 (H)   Review of Glycemic Control  Diabetes history: DM2 Outpatient Diabetes medications: Lantus 30 units QHS, Januvia 100 mg daily, Amaryl 4 mg QAM, Metformin 7793 mg BID, Trulicity 9.68 mg Qweek (Monday) Current orders for Inpatient glycemic control: Lantus 15 units QHS  Inpatient Diabetes Program Recommendations:   Correction (SSI): Please consider ordering CBGs with Novolog 0-9 units TID with meals and Novolog 0-5 units QHS.  Thanks, Barnie Alderman, RN, MSN, CDE Diabetes Coordinator Inpatient Diabetes Program [phone] (Team Pager from 8am to 5pm)

## 2019-09-19 NOTE — Discharge Summary (Signed)
[facility] Discharge Summary  Patient name: [name] Medical record number: [id number] Date of birth: 05/12/1951 Age: 69 y.o. Gender: female Date of Admission: 09/17/2019  Date of Discharge: 09/19/2019 Admitting Physician: Lind Covert, MD  Primary Care Provider: [name], FNP Consultants: None   Indication for Hospitalization: confusion, gait disturbance  Discharge Diagnoses/Problem List:  Active Problems:   Acute kidney injury (nontraumatic) ([place])   Ataxia   Idiopathic hypotension  Disposition: discharge home with [facility] PT/OT   Discharge Condition: improved, stable   Discharge Exam:  General: Alert and cooperative and appears to be in no acute distress Cardio: Normal S1 and S2, Rhythm is regular.   Pulm: Clear to auscultation bilaterally, no crackles, wheezing, or diminished breath sounds. Normal respiratory effort, stable on RA  Abdomen: Bowel sounds normal. Abdomen soft and non-tender.  Extremities: No peripheral edema. Warm/ well perfused.   Neuro: alert and oriented   Brief Hospital Course:  [name] is a 69 y.o. female who presented with weakness and syncopal episodes. 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  Confusion, Syncope and Gait Disturbance likely due to dehydration  At the time of admission, vitals were remarkable for hypotension with a blood pressure of 88/52.  Her labs are remarkable for a creatinine of 2.9 (baseline 1.1), lactic acid 3.0. Given the acuity in the development of her symptoms, stroke was ruled out with MRI brain. Patient was noted ot have been taking in less oral intake for the past few months. Patient had been noted to have depression after the passing of her daughter. She was treated with IV fluids, monitored for orthostatic vitals and lab work was completed to assess for other  potential causes for her acute confusion. Her lactic acid improved following IVFs. She was continued on home aspirin for her history of prior stroke earlier in the year. Hypotension responded well to fluids. By the next hospital day, the patient had returned to her neurological baseline and was ambulating well using a walker with physical therapy. OT/PT both recommended home health services upon discharge to which the patient was agreeable. Nutrition was also consulted and recommended Glucerna shake TID as well as multivitamin.  The patient was found to have prior imaging from her admission in January that was concerning for hepatic steatosis. This was discussed with the patient and the importance of follow up as outpatient was emphasized prior to discharge. In the setting of her recent weight loss, low albumin and fatigue, it is worth the investigation to determine the cause of the changes in her liver. INR was WNL during this admission.   Issues for Follow Up:  1. Recommend repeating BMP to check K as it was low on the day of discharge, patient received 40 mEq twice prior to dc.  2. Also recommend checking magnesium and phosphorus.  3. Please follow up with mood as family expressed concerns for depression prior to the hospitalization. Confirm patient has been able to take Cymbalta as prescribed.  4. Please follow up on hepatic imaging/LFTs. Patient had CT abd/pelvic in January of 2021 with findings concerning for hepatic steatosis and nodular contour as well as low albumin in the setting of recent weight loss.   Significant Procedures: none  Significant Labs and Imaging:  Recent Labs  Lab 09/17/19 2341 09/18/19 0502 09/19/19 0430  WBC 5.7 4.9 5.1  HGB 10.8* 10.9* 10.5*  HCT 35.1* 34.7* 33.6*  PLT 116* 98* 104*   Recent Labs  Lab 09/17/19 2341 09/17/19 2341 09/18/19 0502 09/19/19 0430 09/19/19 1008  NA 137  --  141 140  --   K 3.5   < > 3.4* 2.8*  --   CL 111  --  112* 111  --   CO2  16*  --  18* 16*  --   GLUCOSE 231*  --  156* 212*  --   BUN 29*  --  29* 23  --   CREATININE 2.92*  --  2.29* 1.60*  --   CALCIUM 8.9  --  9.1 8.8*  --   MG  --   --   --   --  1.0*  PHOS  --   --   --   --  2.2*  ALKPHOS 71  --   --   --   --   AST 23  --   --   --   --   ALT 35  --   --   --   --   ALBUMIN 2.7*  --   --   --   --    < > = values in this interval not displayed.     Results/Tests Pending at Time of Discharge: none   Discharge Medications:  Allergies as of 09/19/2019   No Known Allergies     Medication List    TAKE these medications   Accu-Chek Aviva Plus test strip Generic drug: glucose blood USE BID FOR GLUCOSE TESTING   allopurinol 100 MG tablet Commonly known as: ZYLOPRIM Take 1 tablet (100 mg total) by mouth daily.   amLODipine 10 MG tablet Commonly known as: NORVASC Take 10 mg by mouth daily.   aspirin 81 MG chewable tablet Chew 1 tablet (81 mg total) by mouth daily. Start taking on: September 20, 2019 Notes to patient: Take tomorrow.   atorvastatin 20 MG tablet Commonly known as: LIPITOR Take 20 mg by mouth daily.   cetirizine 10 MG tablet Commonly known as: ZYRTEC Take 10 mg by mouth daily.   cyclobenzaprine 5 MG tablet Commonly known as: FLEXERIL Take 5 mg by mouth at bedtime.   diclofenac sodium 1 % Gel Commonly known as: VOLTAREN Apply 2 g topically daily as needed (pain).   DULoxetine 30 MG capsule Commonly known as: CYMBALTA Take 30 mg by mouth daily.   feeding supplement (GLUCERNA SHAKE) Liqd Take 237 mLs by mouth 3 (three) times daily between meals.   ferrous sulfate 325 (65 FE) MG tablet Take 1 tablet (325 mg total) by mouth daily with breakfast.   furosemide 20 MG tablet Commonly known as: LASIX Take 20 mg by mouth daily.   glimepiride 4 MG tablet Commonly known as: AMARYL Take 4 mg by mouth daily with breakfast.   ipratropium-albuterol 0.5-2.5 (3) MG/3ML Soln Commonly known as: DUONEB Take 3 mLs by nebulization  2 (two) times daily. What changed:   when to take this  reasons to take this   Januvia 100 MG tablet Generic drug: sitaGLIPtin Take 100 mg by mouth daily.   Lantus SoloStar 100 UNIT/ML Solostar Pen Generic drug: insulin glargine Inject 30 Units into the skin at bedtime.   losartan 25 MG tablet Commonly known as: Cozaar Take 1 tablet (25 mg total) by mouth daily.   Lyrica 200 MG capsule Generic drug: pregabalin Take 200 mg by mouth 3 (three) times daily.   metFORMIN 1000 MG tablet Commonly  known as: GLUCOPHAGE Take 1,000 mg by mouth 2 (two) times daily.   omeprazole 20 MG capsule Commonly known as: PRILOSEC Take 20 mg by mouth daily.   ondansetron 4 MG tablet Commonly known as: ZOFRAN Take 1 tablet (4 mg total) by mouth every 6 (six) hours. What changed:   when to take this  reasons to take this   Symbicort 80-4.5 MCG/ACT inhaler Generic drug: budesonide-formoterol Inhale 2 puffs into the lungs 2 (two) times daily.   tiZANidine 4 MG tablet Commonly known as: ZANAFLEX Take 4 mg by mouth 2 (two) times daily.   topiramate 100 MG tablet Commonly known as: TOPAMAX Take 100 mg by mouth daily.   traZODone 50 MG tablet Commonly known as: DESYREL Take 50 mg by mouth at bedtime.   Trulicity 5.78 XB/8.4RQ Sopn Generic drug: Dulaglutide Take 0.75 mg by mouth every Monday.   Ventolin HFA 108 (90 Base) MCG/ACT inhaler Generic drug: albuterol Inhale 2 puffs into the lungs every 4 (four) hours as needed for wheezing or shortness of breath.       Discharge Instructions: Please refer to Patient Instructions section of EMR for full details.  Patient was counseled important signs and symptoms that should prompt return to medical care, changes in medications, dietary instructions, activity restrictions, and follow up appointments.   Follow-Up Appointments: Follow-up Information    [name], FNP. Schedule an appointment as soon as possible for a visit.    Specialty: Family Medicine Why: To discuss how depression is dramatically changing your eating habits and leading to this hospitalization. Contact information: [place] [address] [place], [facility] Follow up.   Specialty: Home Health Services Why: The home health agency will contact you for the first home visit. Contact information: [place] [address] [phone]           Stark Klein, MD 09/19/2019, 11:42 PM PGY-1, [place]

## 2019-09-19 NOTE — Discharge Instructions (Signed)
Thank you so much for allowing [place] to be part of your care.  You initially presented to the hospital due to concern about having another stroke, you were admitted for further evaluation of dehydration.  Reassuringly, you did not have evidence of a new stroke, we suspect that your symptoms were from being severely dehydrated due to less appetite/water intake. This can be dangerous when you also take medications that make you more dehydrated (like lasix). Please start taking a multivitamin to help keep your electrolyte levels in the appropriate ranges. I recommend that you follow up with your outpatient doctor to talk about depression treatment that might prevent future episodes this kind of hospitalization.

## 2019-09-19 NOTE — TOC Transition Note (Signed)
Transition of Care [facility]) - CM/SW Discharge Note   Patient Details  Name: [name] MRN: [id number] Date of Birth: 02/03/1951  Transition of Care [facility]) CM/SW Contact:  Pollie Friar, RN Phone Number: 09/19/2019, 2:14 PM   Clinical Narrative:    Pt discharging home with home health services through [facility].  PCP did not have a follow up appt but are going to try and fit her in and will call her with the appt.  No DME needs.  Pt has transportation home.    Final next level of care: Home w Home Health Services Barriers to Discharge: No Barriers Identified   Patient Goals and CMS Choice   CMS Medicare.gov Compare Post Acute Care list provided to:: Patient Choice offered to / list presented to : Patient  Discharge Placement                       Discharge Plan and Services                          HH Arranged: PT, OT [facility] Agency: [facility] Date [place]: 09/19/19 Time [place]: 1117 Representative spoke with at [place]: Cassie  Social Determinants of Health ([place]) Interventions     Readmission Risk Interventions No flowsheet data found.

## 2019-09-19 NOTE — Progress Notes (Signed)
   09/19/19 1100  Clinical Encounter Type  Visited With Patient  Visit Type Initial  Referral From Nurse  Consult/Referral To [place] responded to consult for prayer. Chaplain offered ministry of presence and prayer. Chaplains remain available for support as needs arise.   Chaplain Resident, Evelene Croon M Div [phone] on-call pager

## 2019-09-19 NOTE — TOC Transition Note (Signed)
Transition of Care [facility]) - CM/SW Discharge Note   Patient Details  Name: [name] MRN: [id number] Date of Birth: December 27, 1950  [facility]) CM/SW Contact:  Pollie Friar, RN Phone Number: 09/19/2019, 11:17 AM   Clinical Narrative:    DME at Home: CPAP, cane, walker, shower seat Pt is from home alone. Pt with [place] orders. CM provided choice and [facility] selected. Cassie with [facility] accepted the referral. Pt states she can have her granddaughter or daughter stay with her at home after d/c.  Grandson to provide transport home.    Final next level of care: Home w Home Health Services Barriers to Discharge: No Barriers Identified   Patient Goals and CMS Choice   CMS Medicare.gov Compare Post Acute Care list provided to:: Patient Choice offered to / list presented to : Patient  Discharge Placement                       Discharge Plan and Services                          HH Arranged: PT, OT [facility] Agency: [facility] Date [place]: 09/19/19 Time [place]: 1117 Representative spoke with at [place]: Cassie  Social Determinants of Health ([place]) Interventions     Readmission Risk Interventions No flowsheet data found.

## 2019-09-19 NOTE — Progress Notes (Addendum)
Initial Nutrition Assessment  DOCUMENTATION CODES:   Non-severe (moderate) malnutrition in context of social or environmental circumstances  INTERVENTION:  Glucerna Shake po TID, each supplement provides 220 kcal and 10 grams of protein  MVI daily   NUTRITION DIAGNOSIS:   Moderate Malnutrition related to social / environmental circumstances as evidenced by mild muscle depletion, mild fat depletion, percent weight loss.    GOAL:   Patient will meet greater than or equal to 90% of their needs    MONITOR:   Supplement acceptance, Labs, PO intake, Weight trends, I & O's  REASON FOR ASSESSMENT:   Malnutrition Screening Tool    ASSESSMENT:   Pt presenting with weakness and syncopal episodes. 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  Pt reports her appetite is better today than it has been in months. Pt states she has been eating very little for the last 2 months and mostly drinking Boost supplements for nourishment. Pt reports an unintentional 25lb weight loss over this 2 month time span. Pt states her poor appetite and weight loss were due to the passing of her daughter. RD provided emotional support.   Per weight readings, pt has experienced a 15.9% wt loss x3 months, which is significant for time frame.   No PO intake documented.   Medications reviewed and include: Boost Breeze TID, Ferrous Sulfate, Lantus, Phos-NaK  Labs reviewed: Potassium 2.8 (L), Phosphorus 2.2 (L), Magnesium 1.0 (L) CBGs 165-332 (Diabetes Coordinator following)  NUTRITION - FOCUSED PHYSICAL EXAM:    Most Recent Value  Orbital Region  No depletion  Upper Arm Region  Mild depletion  Thoracic and Lumbar Region  No depletion  Buccal Region  No depletion  Temple Region  Mild depletion  Clavicle Bone Region  Mild depletion  Clavicle and Acromion Bone Region  Mild depletion  Scapular Bone Region  No  depletion  Dorsal Hand  No depletion  Patellar Region  No depletion  Anterior Thigh Region  Mild depletion  Posterior Calf Region  Mild depletion  Edema (RD Assessment)  None  Hair  Reviewed  Eyes  Reviewed  Mouth  Reviewed  Skin  Reviewed  Nails  Reviewed       Diet Order:   Diet Order            Diet - low sodium heart healthy        Diet Carb Modified Fluid consistency: Thin; Room service appropriate? Yes  Diet effective now              EDUCATION NEEDS:   Not appropriate for education at this time  Skin:  Skin Assessment: Reviewed RN Assessment  Last BM:  09/18/19  Height:   Ht Readings from Last 1 Encounters:  09/18/19 5\' 4"  (1.626 m)    Weight:   Wt Readings from Last 1 Encounters:  09/18/19 78.1 kg   BMI:  Body mass index is 29.55 kg/m.  Estimated Nutritional Needs:   Kcal:  1800-2000  Protein:  90-105 grams  Fluid:  >1.8L    Larkin Ina, MS, RD, LDN RD pager number and weekend/on-call pager number located in [place].

## 2019-09-19 NOTE — Progress Notes (Signed)
PIV d/c'ed x 2 w/o complication. AVS reviewed with pt who had no questions. Pt confirms dentures sent home with family. Glasses, charger, phone, and clothing at bedside collected. Pt's grandchild arrived and escorted pt off unit via wheelchair to POV for discharge. Room visually inspected; no belongings left behind.

## 2019-09-20 DIAGNOSIS — I5032 Chronic diastolic (congestive) heart failure: Secondary | ICD-10-CM | POA: Diagnosis not present

## 2019-09-20 DIAGNOSIS — N183 Chronic kidney disease, stage 3 unspecified: Secondary | ICD-10-CM | POA: Diagnosis not present

## 2019-09-20 DIAGNOSIS — I2510 Atherosclerotic heart disease of native coronary artery without angina pectoris: Secondary | ICD-10-CM | POA: Diagnosis not present

## 2019-09-20 DIAGNOSIS — I69393 Ataxia following cerebral infarction: Secondary | ICD-10-CM | POA: Diagnosis not present

## 2019-09-20 DIAGNOSIS — E1122 Type 2 diabetes mellitus with diabetic chronic kidney disease: Secondary | ICD-10-CM | POA: Diagnosis not present

## 2019-09-20 DIAGNOSIS — E1165 Type 2 diabetes mellitus with hyperglycemia: Secondary | ICD-10-CM | POA: Diagnosis not present

## 2019-09-20 DIAGNOSIS — M6281 Muscle weakness (generalized): Secondary | ICD-10-CM | POA: Diagnosis not present

## 2019-09-20 DIAGNOSIS — I130 Hypertensive heart and chronic kidney disease with heart failure and stage 1 through stage 4 chronic kidney disease, or unspecified chronic kidney disease: Secondary | ICD-10-CM | POA: Diagnosis not present

## 2019-09-21 ENCOUNTER — Ambulatory Visit: Payer: Medicare Other | Admitting: Orthopedic Surgery

## 2019-09-21 DIAGNOSIS — I69393 Ataxia following cerebral infarction: Secondary | ICD-10-CM | POA: Diagnosis not present

## 2019-09-21 DIAGNOSIS — N183 Chronic kidney disease, stage 3 unspecified: Secondary | ICD-10-CM | POA: Diagnosis not present

## 2019-09-21 DIAGNOSIS — I5032 Chronic diastolic (congestive) heart failure: Secondary | ICD-10-CM | POA: Diagnosis not present

## 2019-09-21 DIAGNOSIS — I130 Hypertensive heart and chronic kidney disease with heart failure and stage 1 through stage 4 chronic kidney disease, or unspecified chronic kidney disease: Secondary | ICD-10-CM | POA: Diagnosis not present

## 2019-09-21 DIAGNOSIS — E1122 Type 2 diabetes mellitus with diabetic chronic kidney disease: Secondary | ICD-10-CM | POA: Diagnosis not present

## 2019-09-21 DIAGNOSIS — M6281 Muscle weakness (generalized): Secondary | ICD-10-CM | POA: Diagnosis not present

## 2019-09-21 DIAGNOSIS — E1165 Type 2 diabetes mellitus with hyperglycemia: Secondary | ICD-10-CM | POA: Diagnosis not present

## 2019-09-21 DIAGNOSIS — I2510 Atherosclerotic heart disease of native coronary artery without angina pectoris: Secondary | ICD-10-CM | POA: Diagnosis not present

## 2019-09-22 ENCOUNTER — Ambulatory Visit (INDEPENDENT_AMBULATORY_CARE_PROVIDER_SITE_OTHER): Payer: Medicare Other | Admitting: Neurology

## 2019-09-22 ENCOUNTER — Other Ambulatory Visit: Payer: Self-pay

## 2019-09-22 ENCOUNTER — Encounter: Payer: Self-pay | Admitting: Neurology

## 2019-09-22 VITALS — BP 104/62 | HR 81 | Temp 97.6°F | Ht 64.0 in | Wt 175.0 lb

## 2019-09-22 DIAGNOSIS — I639 Cerebral infarction, unspecified: Secondary | ICD-10-CM

## 2019-09-22 NOTE — Progress Notes (Signed)
[facility] [address] [place]. [place] [address] [phone]       OFFICE FOLLOW-UP NOTE  [name] Date of Birth:  07/07/1950 Medical Record Number:  [id number]   HPI: [name] is a 69 year old African-American lady seen today for initial office follow-up visit following hospital consult for stroke in January 2021. She has past medical history of obesity, sleep apnea, neuropathy, hypertension, hyperlipidemia, diabetes who presented to [facility] due to generalized weakness. The patient's grandson stated that she had episodes of passing out and difficulty with walking following episode of diarrhea few days prior to admission. She was found to be hypotensive in the emergency room with blood pressure 80 systolic. She was given fluid boluses and creatinine on admission was 5.6. She was also found to have mild confusion and hence an MRI scan of the brain was obtained which showed a small patchy left occipital embolic infarct. Patient had no focal neurological deficits related to this. She improved with IV fluids and hydration. Carotid ultrasound showed moderate 40 to 59% left ICA and mild right-sided carotid stenosis. Transcranial Doppler study showed diffuse increase in pulsatility indicis bilaterally. Transthoracic echo was unremarkable. LDL cholesterol was low at 20 mg percent. Hemoglobin A1c was elevated at 12.3. Transesophageal echocardiogram showed no cardiac source of embolism or PFO. Patient was started on dual antiplatelet therapy aspirin and Plavix for 3 weeks and is currently on aspirin alone. Plans were to get a loop recorder placed but that has not yet been done. Patient states she has done well without recurrent stroke or TIA symptoms. However she was readmitted on 09/17/2019 with dehydration and hypotension with blood pressure 88 systolic and creatinine of 2.9. She had another MRI scan of the brain performed on 09/19/2019 which showed no  acute infarct. Her hypotension responded well to fluids. She states she is to remain on aspirin is tolerating well. Blood pressure remains low and today it is 106/62. She is tolerating Lipitor well without muscle aches and pains. She complains of tingling numbness and burning in her feet and has chronic peripheral neuropathy related to her diabetes which is stable. She has not yet had loop recorder placed for paroxysmal A. fib ROS:   14 system review of systems is positive for numbness, tingling, gait imbalance, confusion, loss of consciousness and all other systems negative PMH:  Past Medical History:  Diagnosis Date  . Arthritis   . Diabetes mellitus without complication ([place])   . GERD (gastroesophageal reflux disease)   . H/O hiatal hernia   . Headache(784.0)   . Hyperlipidemia   . Hypertension   . Neuromuscular disorder (HCC)    neuropathy feet   . Sleep apnea    recently diagnosed- wears cpap    Social History:  Social History   Socioeconomic History  . Marital status: Divorced    Spouse name: Not on file  . Number of children: 3  . Years of education: Not on file  . Highest education level: Not on file  Occupational History  . Occupation: Haematologist: [employer]  Tobacco Use  . Smoking status: Never Smoker  . Smokeless tobacco: Never Used  Substance and Sexual Activity  . Alcohol use: Yes    Alcohol/week: 0.0 standard drinks    Comment: rarely  . Drug use: No  . Sexual activity: Not Currently  Other Topics Concern  . Not on file  Social History Narrative  . Not on file  Social Determinants of Health   Financial Resource Strain:   . Difficulty of Paying Living Expenses:   Food Insecurity:   . Worried About Charity fundraiser in the Last Year:   . Arboriculturist in the Last Year:   Transportation Needs:   . Film/video editor (Medical):   Marland Kitchen Lack of Transportation (Non-Medical):   Physical Activity:   . Days of Exercise per Week:   . Minutes  of Exercise per Session:   Stress:   . Feeling of Stress :   Social Connections:   . Frequency of Communication with Friends and Family:   . Frequency of Social Gatherings with Friends and Family:   . Attends Religious Services:   . Active Member of Clubs or Organizations:   . Attends Archivist Meetings:   Marland Kitchen Marital Status:   Intimate Partner Violence:   . Fear of Current or Ex-Partner:   . Emotionally Abused:   Marland Kitchen Physically Abused:   . Sexually Abused:     Medications:   Current Outpatient Medications on File Prior to Visit  Medication Sig Dispense Refill  . ACCU-CHEK AVIVA PLUS test strip USE BID FOR GLUCOSE TESTING  11  . amLODipine (NORVASC) 10 MG tablet Take 10 mg by mouth daily.    Marland Kitchen aspirin 81 MG chewable tablet Chew 1 tablet (81 mg total) by mouth daily. 30 tablet 0  . atorvastatin (LIPITOR) 20 MG tablet Take 20 mg by mouth daily.    . cetirizine (ZYRTEC) 10 MG tablet Take 10 mg by mouth daily.     . cyclobenzaprine (FLEXERIL) 5 MG tablet Take 5 mg by mouth at bedtime.     . diclofenac sodium (VOLTAREN) 1 % GEL Apply 2 g topically daily as needed (pain).     . DULoxetine (CYMBALTA) 30 MG capsule Take 30 mg by mouth daily.    . feeding supplement, GLUCERNA SHAKE, (GLUCERNA SHAKE) LIQD Take 237 mLs by mouth 3 (three) times daily between meals. 21330 mL 1  . furosemide (LASIX) 20 MG tablet Take 20 mg by mouth daily.   2  . glimepiride (AMARYL) 4 MG tablet Take 4 mg by mouth daily with breakfast.     . ipratropium-albuterol (DUONEB) 0.5-2.5 (3) MG/3ML SOLN Take 3 mLs by nebulization 2 (two) times daily. (Patient taking differently: Take 3 mLs by nebulization every 6 (six) hours as needed (sob and wheezing). ) 360 mL 1  . JANUVIA 100 MG tablet Take 100 mg by mouth daily.    Marland Kitchen LANTUS SOLOSTAR 100 UNIT/ML Solostar Pen Inject 30 Units into the skin at bedtime.     Marland Kitchen losartan (COZAAR) 25 MG tablet Take 1 tablet (25 mg total) by mouth daily. 30 tablet 1  . LYRICA 200 MG  capsule Take 200 mg by mouth 3 (three) times daily.  1  . metFORMIN (GLUCOPHAGE) 1000 MG tablet Take 1,000 mg by mouth 2 (two) times daily.  0  . omeprazole (PRILOSEC) 20 MG capsule Take 20 mg by mouth daily.     . ondansetron (ZOFRAN) 4 MG tablet Take 1 tablet (4 mg total) by mouth every 6 (six) hours. (Patient taking differently: Take 4 mg by mouth every 8 (eight) hours as needed for nausea or vomiting. ) 12 tablet 0  . SYMBICORT 80-4.5 MCG/ACT inhaler Inhale 2 puffs into the lungs 2 (two) times daily.    Marland Kitchen tiZANidine (ZANAFLEX) 4 MG tablet Take 4 mg by mouth 2 (two) times daily.    Marland Kitchen  topiramate (TOPAMAX) 100 MG tablet Take 100 mg by mouth daily.    . traZODone (DESYREL) 50 MG tablet Take 50 mg by mouth at bedtime.    . TRULICITY 5.95 GL/8.7FI SOPN Take 0.75 mg by mouth every Monday.    . VENTOLIN HFA 108 (90 Base) MCG/ACT inhaler Inhale 2 puffs into the lungs every 4 (four) hours as needed for wheezing or shortness of breath.   5  . allopurinol (ZYLOPRIM) 100 MG tablet Take 1 tablet (100 mg total) by mouth daily. (Patient not taking: Reported on 09/18/2019) 30 tablet 0  . ferrous sulfate 325 (65 FE) MG tablet Take 1 tablet (325 mg total) by mouth daily with breakfast. 30 tablet 0   No current facility-administered medications on file prior to visit.    Allergies:  No Known Allergies  Physical Exam General: Obese middle-aged African-American lady seated, in no evident distress Head: head normocephalic and atraumatic.  Neck: supple with no carotid or supraclavicular bruits Cardiovascular: regular rate and rhythm, no murmurs Musculoskeletal: no deformity Skin:  no rash/petichiae Vascular:  Normal pulses all extremities Vitals:   09/22/19 1509  BP: 104/62  Pulse: 81  Temp: 97.6 F (36.4 C)   Neurologic Exam Mental Status: Awake and fully alert. Oriented to place and time. Recent and remote memory intact. Attention span, concentration and fund of knowledge appropriate. Mood and affect  appropriate.  Cranial Nerves: Fundoscopic exam reveals sharp disc margins. Pupils equal, briskly reactive to light. Extraocular movements full without nystagmus. Visual fields full to confrontation. Hearing intact. Facial sensation intact. Face, tongue, palate moves normally and symmetrically.  Motor: Normal bulk and tone. Normal strength in all tested extremity muscles. Sensory.: intact to touch ,pinprick .but diminished position and vibratory sensation in both feet from ankle down.  Coordination: Rapid alternating movements normal in all extremities. Finger-to-nose and heel-to-shin performed accurately bilaterally. Gait and Station: Arises from chair without difficulty. Stance is broad-based. Gait demonstrates mild imbalance and broad-based.. Unknown able to heel, toe and tandem walk with  difficulty.  Reflexes: 1+ and symmetric. Toes downgoing.   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 Vascular risk factors of diabetes, hypertension hyperlipidemia and mild obesity     PLAN: I had a long d/w patient and her son about her recent cryptogenic stroke, risk for recurrent stroke/TIAs, personally independently reviewed imaging studies and stroke evaluation results and answered questions.Continue aspirin 81 mg daily  for secondary stroke prevention and maintain strict control of hypertension with blood pressure goal below 130/90, diabetes with hemoglobin A1c goal below 6.5% and lipids with LDL cholesterol goal below 70 mg/dL. I also advised the patient to eat a healthy diet with plenty of whole grains, cereals, fruits and vegetables, exercise regularly and maintain ideal body weight.  Plan to check loop recorder for paroxysmal atrial fibrillation.  Patient may also consider participation in the [place] trial for stroke prevention if interested.  She will be given information to review and  decide.  Followup in the future with my nurse practitioner Janett Billow in 6 months or call earlier if necessary. Greater than 50% of time during this 30 minute visit was spent on counseling,explanation of diagnosis, planning of further management, discussion with patient and family and coordination of care Antony Contras, MD  [facility] [address] [place], [place] [address]  Phone [phone] Fax [phone] Note: This document was prepared with digital dictation and  possible smart Company secretary. Any transcriptional errors that result from this process are unintentional

## 2019-09-22 NOTE — Patient Instructions (Signed)
I had a long d/w patient and her son about her recent cryptogenic stroke, risk for recurrent stroke/TIAs, personally independently reviewed imaging studies and stroke evaluation results and answered questions.Continue aspirin 81 mg daily  for secondary stroke prevention and maintain strict control of hypertension with blood pressure goal below 130/90, diabetes with hemoglobin A1c goal below 6.5% and lipids with LDL cholesterol goal below 70 mg/dL. I also advised the patient to eat a healthy diet with plenty of whole grains, cereals, fruits and vegetables, exercise regularly and maintain ideal body weight.  Plan to check loop recorder for paroxysmal atrial fibrillation.  Patient may also consider participation in the [place] trial for stroke prevention if interested.  She will be given information to review and decide.  Followup in the future with my nurse practitioner Janett Billow in 6 months or call earlier if necessary.  Stroke Prevention Some medical conditions and behaviors are associated with a higher chance of having a stroke. You can help prevent a stroke by making nutrition, lifestyle, and other changes, including managing any medical conditions you may have. What nutrition changes can be made?   Eat healthy foods. You can do this by: ? Choosing foods high in fiber, such as fresh fruits and vegetables and whole grains. ? Eating at least 5 or more servings of fruits and vegetables a day. Try to fill half of your plate at each meal with fruits and vegetables. ? Choosing lean protein foods, such as lean cuts of meat, poultry without skin, fish, tofu, beans, and nuts. ? Eating low-fat dairy products. ? Avoiding foods that are high in salt (sodium). This can help lower blood pressure. ? Avoiding foods that have saturated fat, trans fat, and cholesterol. This can help prevent high cholesterol. ? Avoiding processed and premade foods.  Follow your health care provider's specific guidelines for losing  weight, controlling high blood pressure (hypertension), lowering high cholesterol, and managing diabetes. These may include: ? Reducing your daily calorie intake. ? Limiting your daily sodium intake to 1,500 milligrams (mg). ? Using only healthy fats for cooking, such as olive oil, canola oil, or sunflower oil. ? Counting your daily carbohydrate intake. What lifestyle changes can be made?  Maintain a healthy weight. Talk to your health care provider about your ideal weight.  Get at least 30 minutes of moderate physical activity at least 5 days a week. Moderate activity includes brisk walking, biking, and swimming.  Do not use any products that contain nicotine or tobacco, such as cigarettes and e-cigarettes. If you need help quitting, ask your health care provider. It may also be helpful to avoid exposure to secondhand smoke.  Limit alcohol intake to no more than 1 drink a day for nonpregnant women and 2 drinks a day for men. One drink equals 12 oz of beer, 5 oz of wine, or 1 oz of hard liquor.  Stop any illegal drug use.  Avoid taking birth control pills. Talk to your health care provider about the risks of taking birth control pills if: ? You are over 57 years old. ? You smoke. ? You get migraines. ? You have ever had a blood clot. What other changes can be made?  Manage your cholesterol levels. ? Eating a healthy diet is important for preventing high cholesterol. If cholesterol cannot be managed through diet alone, you may also need to take medicines. ? Take any prescribed medicines to control your cholesterol as told by your health care provider.  Manage your diabetes. ? Eating a  healthy diet and exercising regularly are important parts of managing your blood sugar. If your blood sugar cannot be managed through diet and exercise, you may need to take medicines. ? Take any prescribed medicines to control your diabetes as told by your health care provider.  Control your  hypertension. ? To reduce your risk of stroke, try to keep your blood pressure below 130/80. ? Eating a healthy diet and exercising regularly are an important part of controlling your blood pressure. If your blood pressure cannot be managed through diet and exercise, you may need to take medicines. ? Take any prescribed medicines to control hypertension as told by your health care provider. ? Ask your health care provider if you should monitor your blood pressure at home. ? Have your blood pressure checked every year, even if your blood pressure is normal. Blood pressure increases with age and some medical conditions.  Get evaluated for sleep disorders (sleep apnea). Talk to your health care provider about getting a sleep evaluation if you snore a lot or have excessive sleepiness.  Take over-the-counter and prescription medicines only as told by your health care provider. Aspirin or blood thinners (antiplatelets or anticoagulants) may be recommended to reduce your risk of forming blood clots that can lead to stroke.  Make sure that any other medical conditions you have, such as atrial fibrillation or atherosclerosis, are managed. What are the warning signs of a stroke? The warning signs of a stroke can be easily remembered as BEFAST.  B is for balance. Signs include: ? Dizziness. ? Loss of balance or coordination. ? Sudden trouble walking.  E is for eyes. Signs include: ? A sudden change in vision. ? Trouble seeing.  F is for face. Signs include: ? Sudden weakness or numbness of the face. ? The face or eyelid drooping to one side.  A is for arms. Signs include: ? Sudden weakness or numbness of the arm, usually on one side of the body.  S is for speech. Signs include: ? Trouble speaking (aphasia). ? Trouble understanding.  T is for time. ? These symptoms may represent a serious problem that is an emergency. Do not wait to see if the symptoms will go away. Get medical help right  away. Call your local emergency services (911 in the [place] Do not drive yourself to the hospital.  Other signs of stroke may include: ? A sudden, severe headache with no known cause. ? Nausea or vomiting. ? Seizure. Where to find more information For more information, visit:  American Stroke Association: www.strokeassociation.org  National Stroke Association: www.stroke.org Summary  You can prevent a stroke by eating healthy, exercising, not smoking, limiting alcohol intake, and managing any medical conditions you may have.  Do not use any products that contain nicotine or tobacco, such as cigarettes and e-cigarettes. If you need help quitting, ask your health care provider. It may also be helpful to avoid exposure to secondhand smoke.  Remember BEFAST for warning signs of stroke. Get help right away if you or a loved one has any of these signs. This information is not intended to replace advice given to you by your health care provider. Make sure you discuss any questions you have with your health care provider. Document Revised: 05/15/2017 Document Reviewed: 07/08/2016 Elsevier Patient Education  2020 [employer].

## 2019-09-27 ENCOUNTER — Other Ambulatory Visit: Payer: Self-pay

## 2019-09-27 ENCOUNTER — Encounter (HOSPITAL_COMMUNITY): Payer: Self-pay | Admitting: *Deleted

## 2019-09-27 DIAGNOSIS — E1165 Type 2 diabetes mellitus with hyperglycemia: Secondary | ICD-10-CM | POA: Diagnosis not present

## 2019-09-27 DIAGNOSIS — I130 Hypertensive heart and chronic kidney disease with heart failure and stage 1 through stage 4 chronic kidney disease, or unspecified chronic kidney disease: Secondary | ICD-10-CM | POA: Diagnosis not present

## 2019-09-27 DIAGNOSIS — N183 Chronic kidney disease, stage 3 unspecified: Secondary | ICD-10-CM | POA: Diagnosis not present

## 2019-09-27 DIAGNOSIS — E1122 Type 2 diabetes mellitus with diabetic chronic kidney disease: Secondary | ICD-10-CM | POA: Diagnosis not present

## 2019-09-27 DIAGNOSIS — R0902 Hypoxemia: Secondary | ICD-10-CM | POA: Diagnosis not present

## 2019-09-27 DIAGNOSIS — Z5321 Procedure and treatment not carried out due to patient leaving prior to being seen by health care provider: Secondary | ICD-10-CM | POA: Insufficient documentation

## 2019-09-27 DIAGNOSIS — I69393 Ataxia following cerebral infarction: Secondary | ICD-10-CM | POA: Diagnosis not present

## 2019-09-27 DIAGNOSIS — E161 Other hypoglycemia: Secondary | ICD-10-CM | POA: Diagnosis not present

## 2019-09-27 DIAGNOSIS — Z794 Long term (current) use of insulin: Secondary | ICD-10-CM | POA: Insufficient documentation

## 2019-09-27 DIAGNOSIS — I2510 Atherosclerotic heart disease of native coronary artery without angina pectoris: Secondary | ICD-10-CM | POA: Diagnosis not present

## 2019-09-27 DIAGNOSIS — E162 Hypoglycemia, unspecified: Secondary | ICD-10-CM | POA: Diagnosis not present

## 2019-09-27 DIAGNOSIS — I5032 Chronic diastolic (congestive) heart failure: Secondary | ICD-10-CM | POA: Diagnosis not present

## 2019-09-27 DIAGNOSIS — I959 Hypotension, unspecified: Secondary | ICD-10-CM | POA: Diagnosis not present

## 2019-09-27 DIAGNOSIS — M6281 Muscle weakness (generalized): Secondary | ICD-10-CM | POA: Diagnosis not present

## 2019-09-27 DIAGNOSIS — Z743 Need for continuous supervision: Secondary | ICD-10-CM | POA: Diagnosis not present

## 2019-09-27 LAB — CBG MONITORING, ED
Glucose-Capillary: 34 mg/dL — CL (ref 70–99)
Glucose-Capillary: 141 mg/dL — ABNORMAL HIGH (ref 70–99)

## 2019-09-27 MED ORDER — DEXTROSE 50 % IV SOLN
INTRAVENOUS | Status: AC
  Filled 2019-09-27: qty 50

## 2019-09-27 NOTE — ED Triage Notes (Signed)
Pt says that she has not taken her insulin since yesterday. She has taken her metformin. On triage assessment, pt hypoglycemic, spoke with EDP, order to feed patient and give amp of dextrose, will reassess. Pt is alert and oriented.

## 2019-09-27 NOTE — ED Triage Notes (Signed)
Pt arrives via [facility] from home. Having trouble regulating her glucose today, CBG 35 today on arrival, she was given oral glucose, lastest glucose 82. EMS was out previously today, she has taken her metformin after EMS went out. 106/50, hr 80, 18 resp, 99% RA.

## 2019-09-28 ENCOUNTER — Emergency Department (HOSPITAL_COMMUNITY)
Admission: EM | Admit: 2019-09-28 | Discharge: 2019-09-28 | Disposition: A | Discharge status: Home / Self Care | Payer: Medicare Other | Attending: Emergency Medicine | Admitting: Emergency Medicine

## 2019-09-28 DIAGNOSIS — K7460 Unspecified cirrhosis of liver: Secondary | ICD-10-CM | POA: Diagnosis not present

## 2019-09-28 DIAGNOSIS — I69393 Ataxia following cerebral infarction: Secondary | ICD-10-CM | POA: Diagnosis not present

## 2019-09-28 DIAGNOSIS — Z0000 Encounter for general adult medical examination without abnormal findings: Secondary | ICD-10-CM | POA: Diagnosis not present

## 2019-09-28 DIAGNOSIS — E1122 Type 2 diabetes mellitus with diabetic chronic kidney disease: Secondary | ICD-10-CM | POA: Diagnosis not present

## 2019-09-28 DIAGNOSIS — I5032 Chronic diastolic (congestive) heart failure: Secondary | ICD-10-CM | POA: Diagnosis not present

## 2019-09-28 DIAGNOSIS — I2510 Atherosclerotic heart disease of native coronary artery without angina pectoris: Secondary | ICD-10-CM | POA: Diagnosis not present

## 2019-09-28 DIAGNOSIS — N184 Chronic kidney disease, stage 4 (severe): Secondary | ICD-10-CM | POA: Diagnosis not present

## 2019-09-28 DIAGNOSIS — I130 Hypertensive heart and chronic kidney disease with heart failure and stage 1 through stage 4 chronic kidney disease, or unspecified chronic kidney disease: Secondary | ICD-10-CM | POA: Diagnosis not present

## 2019-09-28 DIAGNOSIS — E1165 Type 2 diabetes mellitus with hyperglycemia: Secondary | ICD-10-CM | POA: Diagnosis not present

## 2019-09-28 DIAGNOSIS — M6281 Muscle weakness (generalized): Secondary | ICD-10-CM | POA: Diagnosis not present

## 2019-09-28 DIAGNOSIS — N183 Chronic kidney disease, stage 3 unspecified: Secondary | ICD-10-CM | POA: Diagnosis not present

## 2019-09-28 DIAGNOSIS — E11649 Type 2 diabetes mellitus with hypoglycemia without coma: Secondary | ICD-10-CM | POA: Diagnosis not present

## 2019-09-28 DIAGNOSIS — S0003XA Contusion of scalp, initial encounter: Secondary | ICD-10-CM | POA: Diagnosis not present

## 2019-09-28 DIAGNOSIS — Z79899 Other long term (current) drug therapy: Secondary | ICD-10-CM | POA: Diagnosis not present

## 2019-09-28 LAB — CBG MONITORING, ED
Glucose-Capillary: 142 mg/dL — ABNORMAL HIGH (ref 70–99)
Glucose-Capillary: 143 mg/dL — ABNORMAL HIGH (ref 70–99)

## 2019-09-28 NOTE — ED Notes (Signed)
The patient says her son has to go to work and she has to leave. She says she has an appointment with her doctor in the morning at 9am. Encouraged pt to return if she wishes.

## 2019-09-29 DIAGNOSIS — M6281 Muscle weakness (generalized): Secondary | ICD-10-CM | POA: Diagnosis not present

## 2019-09-29 DIAGNOSIS — I130 Hypertensive heart and chronic kidney disease with heart failure and stage 1 through stage 4 chronic kidney disease, or unspecified chronic kidney disease: Secondary | ICD-10-CM | POA: Diagnosis not present

## 2019-09-29 DIAGNOSIS — I69393 Ataxia following cerebral infarction: Secondary | ICD-10-CM | POA: Diagnosis not present

## 2019-09-29 DIAGNOSIS — I2510 Atherosclerotic heart disease of native coronary artery without angina pectoris: Secondary | ICD-10-CM | POA: Diagnosis not present

## 2019-09-29 DIAGNOSIS — N183 Chronic kidney disease, stage 3 unspecified: Secondary | ICD-10-CM | POA: Diagnosis not present

## 2019-09-29 DIAGNOSIS — E1165 Type 2 diabetes mellitus with hyperglycemia: Secondary | ICD-10-CM | POA: Diagnosis not present

## 2019-09-29 DIAGNOSIS — E1122 Type 2 diabetes mellitus with diabetic chronic kidney disease: Secondary | ICD-10-CM | POA: Diagnosis not present

## 2019-09-29 DIAGNOSIS — I5032 Chronic diastolic (congestive) heart failure: Secondary | ICD-10-CM | POA: Diagnosis not present

## 2019-09-30 DIAGNOSIS — I2510 Atherosclerotic heart disease of native coronary artery without angina pectoris: Secondary | ICD-10-CM | POA: Diagnosis not present

## 2019-09-30 DIAGNOSIS — M6281 Muscle weakness (generalized): Secondary | ICD-10-CM | POA: Diagnosis not present

## 2019-09-30 DIAGNOSIS — E1122 Type 2 diabetes mellitus with diabetic chronic kidney disease: Secondary | ICD-10-CM | POA: Diagnosis not present

## 2019-09-30 DIAGNOSIS — I69393 Ataxia following cerebral infarction: Secondary | ICD-10-CM | POA: Diagnosis not present

## 2019-09-30 DIAGNOSIS — N183 Chronic kidney disease, stage 3 unspecified: Secondary | ICD-10-CM | POA: Diagnosis not present

## 2019-09-30 DIAGNOSIS — I130 Hypertensive heart and chronic kidney disease with heart failure and stage 1 through stage 4 chronic kidney disease, or unspecified chronic kidney disease: Secondary | ICD-10-CM | POA: Diagnosis not present

## 2019-09-30 DIAGNOSIS — E1165 Type 2 diabetes mellitus with hyperglycemia: Secondary | ICD-10-CM | POA: Diagnosis not present

## 2019-09-30 DIAGNOSIS — I5032 Chronic diastolic (congestive) heart failure: Secondary | ICD-10-CM | POA: Diagnosis not present

## 2019-10-03 DIAGNOSIS — I5032 Chronic diastolic (congestive) heart failure: Secondary | ICD-10-CM | POA: Diagnosis not present

## 2019-10-03 DIAGNOSIS — I130 Hypertensive heart and chronic kidney disease with heart failure and stage 1 through stage 4 chronic kidney disease, or unspecified chronic kidney disease: Secondary | ICD-10-CM | POA: Diagnosis not present

## 2019-10-03 DIAGNOSIS — M6281 Muscle weakness (generalized): Secondary | ICD-10-CM | POA: Diagnosis not present

## 2019-10-03 DIAGNOSIS — I69393 Ataxia following cerebral infarction: Secondary | ICD-10-CM | POA: Diagnosis not present

## 2019-10-03 DIAGNOSIS — I2510 Atherosclerotic heart disease of native coronary artery without angina pectoris: Secondary | ICD-10-CM | POA: Diagnosis not present

## 2019-10-03 DIAGNOSIS — N183 Chronic kidney disease, stage 3 unspecified: Secondary | ICD-10-CM | POA: Diagnosis not present

## 2019-10-03 DIAGNOSIS — E1165 Type 2 diabetes mellitus with hyperglycemia: Secondary | ICD-10-CM | POA: Diagnosis not present

## 2019-10-03 DIAGNOSIS — E1122 Type 2 diabetes mellitus with diabetic chronic kidney disease: Secondary | ICD-10-CM | POA: Diagnosis not present

## 2019-10-05 DIAGNOSIS — I69393 Ataxia following cerebral infarction: Secondary | ICD-10-CM | POA: Diagnosis not present

## 2019-10-05 DIAGNOSIS — E1165 Type 2 diabetes mellitus with hyperglycemia: Secondary | ICD-10-CM | POA: Diagnosis not present

## 2019-10-05 DIAGNOSIS — I2510 Atherosclerotic heart disease of native coronary artery without angina pectoris: Secondary | ICD-10-CM | POA: Diagnosis not present

## 2019-10-05 DIAGNOSIS — N183 Chronic kidney disease, stage 3 unspecified: Secondary | ICD-10-CM | POA: Diagnosis not present

## 2019-10-05 DIAGNOSIS — M6281 Muscle weakness (generalized): Secondary | ICD-10-CM | POA: Diagnosis not present

## 2019-10-05 DIAGNOSIS — I5032 Chronic diastolic (congestive) heart failure: Secondary | ICD-10-CM | POA: Diagnosis not present

## 2019-10-05 DIAGNOSIS — I130 Hypertensive heart and chronic kidney disease with heart failure and stage 1 through stage 4 chronic kidney disease, or unspecified chronic kidney disease: Secondary | ICD-10-CM | POA: Diagnosis not present

## 2019-10-05 DIAGNOSIS — E1122 Type 2 diabetes mellitus with diabetic chronic kidney disease: Secondary | ICD-10-CM | POA: Diagnosis not present

## 2019-10-06 DIAGNOSIS — E1165 Type 2 diabetes mellitus with hyperglycemia: Secondary | ICD-10-CM | POA: Diagnosis not present

## 2019-10-06 DIAGNOSIS — M6281 Muscle weakness (generalized): Secondary | ICD-10-CM | POA: Diagnosis not present

## 2019-10-06 DIAGNOSIS — E1122 Type 2 diabetes mellitus with diabetic chronic kidney disease: Secondary | ICD-10-CM | POA: Diagnosis not present

## 2019-10-06 DIAGNOSIS — N183 Chronic kidney disease, stage 3 unspecified: Secondary | ICD-10-CM | POA: Diagnosis not present

## 2019-10-06 DIAGNOSIS — I2510 Atherosclerotic heart disease of native coronary artery without angina pectoris: Secondary | ICD-10-CM | POA: Diagnosis not present

## 2019-10-06 DIAGNOSIS — I130 Hypertensive heart and chronic kidney disease with heart failure and stage 1 through stage 4 chronic kidney disease, or unspecified chronic kidney disease: Secondary | ICD-10-CM | POA: Diagnosis not present

## 2019-10-06 DIAGNOSIS — I5032 Chronic diastolic (congestive) heart failure: Secondary | ICD-10-CM | POA: Diagnosis not present

## 2019-10-06 DIAGNOSIS — I69393 Ataxia following cerebral infarction: Secondary | ICD-10-CM | POA: Diagnosis not present

## 2019-10-10 ENCOUNTER — Ambulatory Visit: Payer: Self-pay

## 2019-10-10 ENCOUNTER — Encounter: Payer: Self-pay | Admitting: Orthopedic Surgery

## 2019-10-10 ENCOUNTER — Other Ambulatory Visit: Payer: Self-pay

## 2019-10-10 ENCOUNTER — Ambulatory Visit (INDEPENDENT_AMBULATORY_CARE_PROVIDER_SITE_OTHER): Payer: Medicare Other | Admitting: Orthopedic Surgery

## 2019-10-10 VITALS — Ht 64.0 in | Wt 175.0 lb

## 2019-10-10 DIAGNOSIS — I69393 Ataxia following cerebral infarction: Secondary | ICD-10-CM | POA: Diagnosis not present

## 2019-10-10 DIAGNOSIS — G8929 Other chronic pain: Secondary | ICD-10-CM

## 2019-10-10 DIAGNOSIS — N183 Chronic kidney disease, stage 3 unspecified: Secondary | ICD-10-CM | POA: Diagnosis not present

## 2019-10-10 DIAGNOSIS — M170 Bilateral primary osteoarthritis of knee: Secondary | ICD-10-CM

## 2019-10-10 DIAGNOSIS — I5032 Chronic diastolic (congestive) heart failure: Secondary | ICD-10-CM | POA: Diagnosis not present

## 2019-10-10 DIAGNOSIS — I130 Hypertensive heart and chronic kidney disease with heart failure and stage 1 through stage 4 chronic kidney disease, or unspecified chronic kidney disease: Secondary | ICD-10-CM | POA: Diagnosis not present

## 2019-10-10 DIAGNOSIS — E1122 Type 2 diabetes mellitus with diabetic chronic kidney disease: Secondary | ICD-10-CM | POA: Diagnosis not present

## 2019-10-10 DIAGNOSIS — E1165 Type 2 diabetes mellitus with hyperglycemia: Secondary | ICD-10-CM | POA: Diagnosis not present

## 2019-10-10 DIAGNOSIS — I2510 Atherosclerotic heart disease of native coronary artery without angina pectoris: Secondary | ICD-10-CM | POA: Diagnosis not present

## 2019-10-10 DIAGNOSIS — Z9889 Other specified postprocedural states: Secondary | ICD-10-CM

## 2019-10-10 DIAGNOSIS — M5410 Radiculopathy, site unspecified: Secondary | ICD-10-CM

## 2019-10-10 DIAGNOSIS — M25561 Pain in right knee: Secondary | ICD-10-CM

## 2019-10-10 DIAGNOSIS — M6281 Muscle weakness (generalized): Secondary | ICD-10-CM | POA: Diagnosis not present

## 2019-10-11 DIAGNOSIS — E1122 Type 2 diabetes mellitus with diabetic chronic kidney disease: Secondary | ICD-10-CM | POA: Diagnosis not present

## 2019-10-11 DIAGNOSIS — N184 Chronic kidney disease, stage 4 (severe): Secondary | ICD-10-CM | POA: Diagnosis not present

## 2019-10-11 DIAGNOSIS — Z008 Encounter for other general examination: Secondary | ICD-10-CM | POA: Diagnosis not present

## 2019-10-11 DIAGNOSIS — I129 Hypertensive chronic kidney disease with stage 1 through stage 4 chronic kidney disease, or unspecified chronic kidney disease: Secondary | ICD-10-CM | POA: Diagnosis not present

## 2019-10-13 DIAGNOSIS — N183 Chronic kidney disease, stage 3 unspecified: Secondary | ICD-10-CM | POA: Diagnosis not present

## 2019-10-13 DIAGNOSIS — E1122 Type 2 diabetes mellitus with diabetic chronic kidney disease: Secondary | ICD-10-CM | POA: Diagnosis not present

## 2019-10-13 DIAGNOSIS — I130 Hypertensive heart and chronic kidney disease with heart failure and stage 1 through stage 4 chronic kidney disease, or unspecified chronic kidney disease: Secondary | ICD-10-CM | POA: Diagnosis not present

## 2019-10-13 DIAGNOSIS — I5032 Chronic diastolic (congestive) heart failure: Secondary | ICD-10-CM | POA: Diagnosis not present

## 2019-10-13 DIAGNOSIS — E1165 Type 2 diabetes mellitus with hyperglycemia: Secondary | ICD-10-CM | POA: Diagnosis not present

## 2019-10-13 DIAGNOSIS — I2510 Atherosclerotic heart disease of native coronary artery without angina pectoris: Secondary | ICD-10-CM | POA: Diagnosis not present

## 2019-10-13 DIAGNOSIS — M6281 Muscle weakness (generalized): Secondary | ICD-10-CM | POA: Diagnosis not present

## 2019-10-13 DIAGNOSIS — I69393 Ataxia following cerebral infarction: Secondary | ICD-10-CM | POA: Diagnosis not present

## 2019-10-14 DIAGNOSIS — N183 Chronic kidney disease, stage 3 unspecified: Secondary | ICD-10-CM | POA: Diagnosis not present

## 2019-10-14 DIAGNOSIS — M6281 Muscle weakness (generalized): Secondary | ICD-10-CM | POA: Diagnosis not present

## 2019-10-14 DIAGNOSIS — I2510 Atherosclerotic heart disease of native coronary artery without angina pectoris: Secondary | ICD-10-CM | POA: Diagnosis not present

## 2019-10-14 DIAGNOSIS — E1122 Type 2 diabetes mellitus with diabetic chronic kidney disease: Secondary | ICD-10-CM | POA: Diagnosis not present

## 2019-10-14 DIAGNOSIS — I69393 Ataxia following cerebral infarction: Secondary | ICD-10-CM | POA: Diagnosis not present

## 2019-10-14 DIAGNOSIS — E1165 Type 2 diabetes mellitus with hyperglycemia: Secondary | ICD-10-CM | POA: Diagnosis not present

## 2019-10-14 DIAGNOSIS — I5032 Chronic diastolic (congestive) heart failure: Secondary | ICD-10-CM | POA: Diagnosis not present

## 2019-10-14 DIAGNOSIS — I130 Hypertensive heart and chronic kidney disease with heart failure and stage 1 through stage 4 chronic kidney disease, or unspecified chronic kidney disease: Secondary | ICD-10-CM | POA: Diagnosis not present

## 2019-10-16 ENCOUNTER — Encounter: Payer: Self-pay | Admitting: Orthopedic Surgery

## 2019-10-16 NOTE — Progress Notes (Signed)
Office Visit Note   Patient: [name]           Date of Birth: Sep 14, 1950           MRN: [id number] Visit Date: 10/10/2019 Requested by: [name], FNP [place],  [place] [id number] PCP: [name], FNP  Subjective: Chief Complaint  Patient presents with  . Right Leg - Pain  . Left Leg - Pain    HPI: [name] is a 69 y.o. female who presents to the office complaining of bilateral leg pain.  Patient notes history of chronic pain for multiple years.  She complains of low back pain with lateral thigh pain that travels down her legs.  Her right leg bothers her more than her left.  She describes a "sharp, burning pain".  She has years of bilateral leg radicular pain.  She notes gradual worsening of this pain.  She has a history of a two-level fusion in 2010.  Her pain is worse with spine flexion/extension.  She denies any groin pain.  She also notes numbness and tingling in the bilateral legs and her feet and her thighs as well as subjective weakness of both legs.  She has seen her primary care physician who ordered MRIs of the left knee, left thigh, left hip.  She has a history of knee arthritis, for which she had a knee injection without significant relief 2 years ago.  She has no injections recently..                ROS:  All systems reviewed are negative as they relate to the chief complaint within the history of present illness.  Patient denies fevers or chills.  Assessment & Plan: Visit Diagnoses:  1. Radiculopathy of leg   2. Chronic pain of right knee   3. Personal history of spine surgery   4. Bilateral primary osteoarthritis of knee     Plan: Patient is a 69 year old female presents complaint of bilateral leg pain with low back pain.  Symptoms are worsening gradually but steadily.  She has history of 2 level fusion of the lumbar spine in 2010.  She has increasing subjective weakness of her legs as well as numbness and tingling  in both of her legs.  MRI of the left knee does show osteoarthritis.  Radiographs were taken today due to a soft tissue concavity that was found but there were no correlating findings on x-ray.  She does have bilateral knee osteoarthritis.  Offered right knee injection.  Patient accepted and tolerated procedure well.  She does have a history of diabetes so she may follow-up in the future if she decides she wants her left knee injection.  Impression is that the majority of her symptoms from the back with radiation down the legs is coming from her lumbar spine.  Ordered MRI of the lumbar spine with and without contrast.  Will call patient with results and refer her to Dr. Vertell Limber.  Patient agreed with this plan.  Follow-Up Instructions: No follow-ups on file.   Orders:  Orders Placed This Encounter  Procedures  . XR Lumbar Spine 2-3 Views  . XR Knee Complete 4 Views Right  . MR LUMBAR SPINE W WO CONTRAST  . Ambulatory referral to Neurosurgery   No orders of the defined types were placed in this encounter.     Procedures: No procedures performed   Clinical Data: No additional findings.  Objective: Vital Signs: Ht  5\' 4"  (1.626 m)   Wt 175 lb (79.4 kg)   BMI 30.04 kg/m   Physical Exam:  Constitutional: Patient appears well-developed HEENT:  Head: Normocephalic Eyes:EOM are normal Neck: Normal range of motion Cardiovascular: Normal rate Pulmonary/chest: Effort normal Neurologic: Patient is alert Skin: Skin is warm Psychiatric: Patient has normal mood and affect  Ortho Exam:  Bilateral knee Exam Tender to palpation over the medial and lateral joint lines.  Soft tissue deficiency on the medial aspect of the proximal shin.  Tender to palpation over this location. No effusion Extensor mechanism intact No TTP over quad tendon, patellar tendon, pes anserinus, patella, tibial tubercle, LCL/MCL insertions Stable to varus/valgus stresses.  Stable to anterior/posterior drawer Extension  to 0 degrees Flexion > 90 degrees  Tender to palpation over the axial lumbar spine and paraspinal musculature, more so on the right side of the lumbar spine.  5/5 motor strength of the bilateral hip flexors, quadriceps, hamstring, dorsiflexion, plantarflexion.  Worse pain with spine flexion/extension.  Tenderness to palpation over the right trochanteric bursa.  Sensation intact through all dermatomes of the bilateral lower extremities.  No evidence of hypo or hyperreflexia.  Specialty Comments:  No specialty comments available.  Imaging: No results found.   PMFS History: Patient Active Problem List   Diagnosis Date Noted  . Idiopathic hypotension   . Ataxia 09/18/2019  . Cerebral embolism with cerebral infarction 07/08/2019  . Acute kidney injury (nontraumatic) ([facility]) 07/07/2019  . Anemia associated with chronic renal failure 07/07/2019  . Acute bronchiolitis 10/10/2017  . Shortness of breath 10/21/2015  . Acute bronchitis and bronchiolitis 10/21/2015  . Lactic acidosis 10/21/2015  . Benign essential HTN 10/21/2015  . GERD (gastroesophageal reflux disease) 10/21/2015  . Diabetes mellitus with nephropathy ([facility]) 10/21/2015  . Acute renal failure superimposed on stage 3 chronic kidney disease ([facility]) 10/21/2015  . Chronic arthritis 10/21/2015  . SOB (shortness of breath) 10/21/2015  . OSA (obstructive sleep apnea) 10/11/2012  . Rotator cuff tear, non-traumatic 08/25/2012   Past Medical History:  Diagnosis Date  . Arthritis   . Diabetes mellitus without complication ([facility])   . GERD (gastroesophageal reflux disease)   . H/O hiatal hernia   . Headache(784.0)   . Hyperlipidemia   . Hypertension   . Neuromuscular disorder ([facility])    neuropathy feet   . Sleep apnea    recently diagnosed- wears cpap    Family History  Problem Relation Age of Onset  . Colon cancer Father   . Colon cancer Paternal Grandfather   . Colon cancer Maternal Grandfather   . Bone cancer Maternal Uncle   .  Colon polyps Neg Hx   . Esophageal cancer Neg Hx   . Rectal cancer Neg Hx   . Stomach cancer Neg Hx     Past Surgical History:  Procedure Laterality Date  . ABDOMINAL HYSTERECTOMY     partial  . BACK SURGERY    . BUBBLE STUDY  07/11/2019   Procedure: BUBBLE STUDY;  Surgeon: Dixie Dials, MD;  Location: [facility];  Service: Cardiovascular;;  . CHOLECYSTECTOMY    . COLONOSCOPY     age 82- doesnt know where or who   . left shoulder     x 2 surgeries  . right shoulder     x2  . SHOULDER OPEN ROTATOR CUFF REPAIR Right 08/25/2012   Procedure: ROTATOR CUFF REPAIR SHOULDER OPEN WITH ACROMIOPLASTY;  Surgeon: Tobi Bastos, MD;  Location: [facility];  Service: Orthopedics;  Laterality: Right;  .  TEE WITHOUT CARDIOVERSION N/A 07/11/2019   Procedure: TRANSESOPHAGEAL ECHOCARDIOGRAM (TEE);  Surgeon: Dixie Dials, MD;  Location: [facility] ENDOSCOPY;  Service: Cardiovascular;  Laterality: N/A;  . TONSILLECTOMY     Social History   Occupational History  . Occupation: Haematologist: [employer]  Tobacco Use  . Smoking status: Never Smoker  . Smokeless tobacco: Never Used  Substance and Sexual Activity  . Alcohol use: Yes    Alcohol/week: 0.0 standard drinks    Comment: rarely  . Drug use: No  . Sexual activity: Not Currently

## 2019-10-18 DIAGNOSIS — I2510 Atherosclerotic heart disease of native coronary artery without angina pectoris: Secondary | ICD-10-CM | POA: Diagnosis not present

## 2019-10-18 DIAGNOSIS — I5032 Chronic diastolic (congestive) heart failure: Secondary | ICD-10-CM | POA: Diagnosis not present

## 2019-10-18 DIAGNOSIS — N183 Chronic kidney disease, stage 3 unspecified: Secondary | ICD-10-CM | POA: Diagnosis not present

## 2019-10-18 DIAGNOSIS — E1165 Type 2 diabetes mellitus with hyperglycemia: Secondary | ICD-10-CM | POA: Diagnosis not present

## 2019-10-18 DIAGNOSIS — I69393 Ataxia following cerebral infarction: Secondary | ICD-10-CM | POA: Diagnosis not present

## 2019-10-18 DIAGNOSIS — M6281 Muscle weakness (generalized): Secondary | ICD-10-CM | POA: Diagnosis not present

## 2019-10-18 DIAGNOSIS — E1122 Type 2 diabetes mellitus with diabetic chronic kidney disease: Secondary | ICD-10-CM | POA: Diagnosis not present

## 2019-10-18 DIAGNOSIS — I130 Hypertensive heart and chronic kidney disease with heart failure and stage 1 through stage 4 chronic kidney disease, or unspecified chronic kidney disease: Secondary | ICD-10-CM | POA: Diagnosis not present

## 2019-10-19 DIAGNOSIS — I69393 Ataxia following cerebral infarction: Secondary | ICD-10-CM | POA: Diagnosis not present

## 2019-10-19 DIAGNOSIS — N183 Chronic kidney disease, stage 3 unspecified: Secondary | ICD-10-CM | POA: Diagnosis not present

## 2019-10-19 DIAGNOSIS — I5032 Chronic diastolic (congestive) heart failure: Secondary | ICD-10-CM | POA: Diagnosis not present

## 2019-10-19 DIAGNOSIS — I2510 Atherosclerotic heart disease of native coronary artery without angina pectoris: Secondary | ICD-10-CM | POA: Diagnosis not present

## 2019-10-19 DIAGNOSIS — M6281 Muscle weakness (generalized): Secondary | ICD-10-CM | POA: Diagnosis not present

## 2019-10-19 DIAGNOSIS — E1122 Type 2 diabetes mellitus with diabetic chronic kidney disease: Secondary | ICD-10-CM | POA: Diagnosis not present

## 2019-10-19 DIAGNOSIS — E1165 Type 2 diabetes mellitus with hyperglycemia: Secondary | ICD-10-CM | POA: Diagnosis not present

## 2019-10-19 DIAGNOSIS — I130 Hypertensive heart and chronic kidney disease with heart failure and stage 1 through stage 4 chronic kidney disease, or unspecified chronic kidney disease: Secondary | ICD-10-CM | POA: Diagnosis not present

## 2019-10-25 DIAGNOSIS — M6281 Muscle weakness (generalized): Secondary | ICD-10-CM | POA: Diagnosis not present

## 2019-10-25 DIAGNOSIS — E1122 Type 2 diabetes mellitus with diabetic chronic kidney disease: Secondary | ICD-10-CM | POA: Diagnosis not present

## 2019-10-25 DIAGNOSIS — I5032 Chronic diastolic (congestive) heart failure: Secondary | ICD-10-CM | POA: Diagnosis not present

## 2019-10-25 DIAGNOSIS — E1165 Type 2 diabetes mellitus with hyperglycemia: Secondary | ICD-10-CM | POA: Diagnosis not present

## 2019-10-25 DIAGNOSIS — I69393 Ataxia following cerebral infarction: Secondary | ICD-10-CM | POA: Diagnosis not present

## 2019-10-25 DIAGNOSIS — N183 Chronic kidney disease, stage 3 unspecified: Secondary | ICD-10-CM | POA: Diagnosis not present

## 2019-10-25 DIAGNOSIS — I2510 Atherosclerotic heart disease of native coronary artery without angina pectoris: Secondary | ICD-10-CM | POA: Diagnosis not present

## 2019-10-25 DIAGNOSIS — I130 Hypertensive heart and chronic kidney disease with heart failure and stage 1 through stage 4 chronic kidney disease, or unspecified chronic kidney disease: Secondary | ICD-10-CM | POA: Diagnosis not present

## 2019-10-26 ENCOUNTER — Other Ambulatory Visit: Payer: Self-pay

## 2019-10-26 ENCOUNTER — Encounter: Payer: Self-pay | Admitting: Internal Medicine

## 2019-10-26 ENCOUNTER — Ambulatory Visit (INDEPENDENT_AMBULATORY_CARE_PROVIDER_SITE_OTHER): Payer: Medicare Other | Admitting: Internal Medicine

## 2019-10-26 VITALS — BP 112/64 | HR 82 | Ht 64.0 in | Wt 173.2 lb

## 2019-10-26 DIAGNOSIS — I639 Cerebral infarction, unspecified: Secondary | ICD-10-CM | POA: Diagnosis not present

## 2019-10-26 DIAGNOSIS — I119 Hypertensive heart disease without heart failure: Secondary | ICD-10-CM

## 2019-10-26 DIAGNOSIS — E782 Mixed hyperlipidemia: Secondary | ICD-10-CM | POA: Diagnosis not present

## 2019-10-26 HISTORY — PX: OTHER SURGICAL HISTORY: SHX169

## 2019-10-26 NOTE — Patient Instructions (Addendum)
Medication Instructions:  Your physician recommends that you continue on your current medications as directed. Please refer to the Current Medication list given to you today.  *If you need a refill on your cardiac medications before your next appointment, please call your pharmacy*   Lab Work: None ordered   Testing/Procedures: None ordered   Follow-Up: Your physician recommends that you schedule a follow-up appointment in: 7-10 days for a wound check.  The office will call you to arrange this appointment.  At [facility], you and your health needs are our priority.  As part of our continuing mission to provide you with exceptional heart care, we have created designated Provider Care Teams.  These Care Teams include your primary Cardiologist (physician) and Advanced Practice Providers (APPs -  Physician Assistants and Nurse Practitioners) who all work together to provide you with the care you need, when you need it.  We recommend signing up for the patient portal called "MyChart".  Sign up information is provided on this After Visit Summary.  MyChart is used to connect with patients for Virtual Visits (Telemedicine).  Patients are able to view lab/test results, encounter notes, upcoming appointments, etc.  Non-urgent messages can be sent to your provider as well.   To learn more about what you can do with MyChart, go to [email or web address].    Your next appointment:    As needed    The format for your next appointment:   In Person  Provider:   You may see Dr. Rayann Heman or one of the following Advanced Practice Providers on your designated Care Team:    Chanetta Marshall, NP  Tommye Standard, PA-C  Legrand Como "[place]" Allgood, [place]    Thank you for choosing [facility]!!    Other Instructions   Implantable Loop Recorder Placement  An implantable loop recorder is a small electronic device that is placed under the skin of your chest. It is about the size of an AA ("double A")  battery. The device records the electrical activity of your heart over a long period of time. Your health care provider can download these recordings to monitor your heart. You may need an implantable loop recorder if you have periods of abnormal heart activity (arrhythmias) or unexplained fainting (syncope). The recorder can be left in place for 1 year or longer. Tell a health care provider about:  Any allergies you have.  All medicines you are taking, including vitamins, herbs, eye drops, creams, and over-the-counter medicines.  Any problems you or family members have had with anesthetic medicines.  Any blood disorders you have.  Any surgeries you have had.  Any medical conditions you have.  Whether you are pregnant or may be pregnant. What are the risks? Generally, this is a safe procedure. However, problems may occur, including:  Infection.  Bleeding.  Allergic reactions to anesthetic medicines.  Damage to nerves or blood vessels.  Failure of the device to work. This could require another surgery to replace it. What happens before the procedure?   You may have a physical exam, blood tests, and imaging tests of your heart, such as a chest X-ray.  Follow instructions from your health care provider about eating or drinking restrictions.  Ask your health care provider about: ? Changing or stopping your regular medicines. This is especially important if you are taking diabetes medicines or blood thinners. ? Taking medicines such as aspirin and ibuprofen. These medicines can thin your blood. Do not take these medicines unless your health care provider  tells you to take them. ? Taking over-the-counter medicines, vitamins, herbs, and supplements.  Ask your health care provider how your surgical site will be marked or identified.  Ask your health care provider what steps will be taken to help prevent infection. These may include: ? Removing hair at the surgery site. ? Washing  skin with a germ-killing soap.  Plan to have someone take you home from the hospital or clinic.  Plan to have a responsible adult care for you for at least 24 hours after you leave the hospital or clinic. This is important.  Do not use any products that contain nicotine or tobacco, such as cigarettes and e-cigarettes. If you need help quitting, ask your health care provider. What happens during the procedure?  An IV will be inserted into one of your veins.  You may be given one or more of the following: ? A medicine to help you relax (sedative). ? A medicine to numb the area (local anesthetic).  A small incision will be made on the left side of your upper chest.  A pocket will be created under your skin.  The device will be placed in the pocket.  The incision will be closed with stitches (sutures) or adhesive strips.  A bandage (dressing) will be placed over the incision. The procedure may vary among health care providers and hospitals. What happens after the procedure?  Your blood pressure, heart rate, breathing rate, and blood oxygen level will be monitored until you leave the hospital or clinic.  You may be able to go home on the day of your surgery. Before you go home: ? Your health care provider will program your recorder. ? You will learn how to trigger your device with a handheld activator. ? You will learn how to send recordings to your health care provider. ? You will get an ID card for your device, and you will be told when to use it.  Do not drive for 24 hours if you were given a sedative during your procedure. Summary  An implantable loop recorder is a small electronic device that is placed under the skin of your chest to monitor your heart over a long period of time.  The recorder can be left in place for 1 year or longer.  Plan to have someone take you home from the hospital or clinic. This information is not intended to replace advice given to you by your  health care provider. Make sure you discuss any questions you have with your health care provider. Document Revised: 08/06/2017 Document Reviewed: 07/18/2017 Elsevier Patient Education  [place] Placement, Care After This sheet gives you information about how to care for yourself after your procedure. Your health care provider may also give you more specific instructions. If you have problems or questions, contact your health care provider. What can I expect after the procedure? After the procedure, it is common to have:  Soreness or discomfort near the incision.  Some swelling or bruising near the incision. Follow these instructions at home: Incision care   Follow instructions from your health care provider about how to take care of your incision. Make sure you: ? Wash your hands with soap and water before you change your bandage (dressing). If soap and water are not available, use hand sanitizer. ? Change your dressing as told by your health care provider. ? Keep your dressing dry. ? Leave stitches (sutures), skin glue, or adhesive strips in place.  These skin closures may need to stay in place for 2 weeks or longer. If adhesive strip edges start to loosen and curl up, you may trim the loose edges. Do not remove adhesive strips completely unless your health care provider tells you to do that.  Check your incision area every day for signs of infection. Check for: ? Redness, swelling, or pain. ? Fluid or blood. ? Warmth. ? Pus or a bad smell.  Do not take baths, swim, or use a hot tub until your health care provider approves. Ask your health care provider if you can take showers. Activity   Return to your normal activities as told by your health care provider. Ask your health care provider what activities are safe for you.  Do not drive for 24 hours if you were given a sedative during your procedure. General instructions  Follow instructions  from your health care provider about how to manage your implantable loop recorder and transmit the information. Learn how to activate a recording if this is necessary for your type of device.  Do not go through a metal detection gate, and do not let someone hold a metal detector over your chest. Show your ID card.  Do not have an MRI unless you check with your health care provider first.  Take over-the-counter and prescription medicines only as told by your health care provider.  Keep all follow-up visits as told by your health care provider. This is important. Contact a health care provider if:  You have redness, swelling, or pain around your incision.  You have a fever.  You have pain that is not relieved by your pain medicine.  You have triggered your device because of fainting (syncope) or because of a heartbeat that feels like it is racing, slow, fluttering, or skipping (palpitations). Get help right away if you have:  Chest pain.  Difficulty breathing. Summary  After the procedure, it is common to have soreness or discomfort near the incision.  Change your dressing as told by your health care provider.  Follow instructions from your health care provider about how to manage your implantable loop recorder and transmit the information.  Keep all follow-up visits as told by your health care provider. This is important. This information is not intended to replace advice given to you by your health care provider. Make sure you discuss any questions you have with your health care provider. Document Revised: 07/18/2017 Document Reviewed: 07/18/2017 Elsevier Patient Education  2020 [employer].

## 2019-10-26 NOTE — Progress Notes (Signed)
Electrophysiology Office Note   Date:  10/26/2019   ID:  [name], DOB 11-24-1950, MRN [id number]  PCP:  [name], FNP    Primary Electrophysiologist: Thompson Grayer, MD    CC: stroke   History of Present Illness: [name] is a 69 y.o. female who presents today for electrophysiology evaluation.   She is referred by Dr Leonie Man for EP evaluation for possible arrhythmogenic cause for her stroke.  She has OA, HTN, and DM.  She was found to have left occipital stroke 1/21.  This is felt to be embolic.  No cause has been identified.  Today, she denies symptoms of palpitations, chest pain, shortness of breath, orthopnea, PND, lower extremity edema, claudication, dizziness, presyncope, syncope, bleeding, or neurologic sequela. The patient is tolerating medications without difficulties and is otherwise without complaint today.    Past Medical History:  Diagnosis Date  . Arthritis   . Diabetes mellitus without complication ([place])   . GERD (gastroesophageal reflux disease)   . H/O hiatal hernia   . Headache(784.0)   . Hyperlipidemia   . Hypertension   . Neuromuscular disorder (HCC)    neuropathy feet   . Sleep apnea    recently diagnosed- wears cpap   Past Surgical History:  Procedure Laterality Date  . ABDOMINAL HYSTERECTOMY     partial  . BACK SURGERY    . BUBBLE STUDY  07/11/2019   Procedure: BUBBLE STUDY;  Surgeon: Dixie Dials, MD;  Location: [place];  Service: Cardiovascular;;  . CHOLECYSTECTOMY    . COLONOSCOPY     age 37- doesnt know where or who   . left shoulder     x 2 surgeries  . right shoulder     x2  . SHOULDER OPEN ROTATOR CUFF REPAIR Right 08/25/2012   Procedure: ROTATOR CUFF REPAIR SHOULDER OPEN WITH ACROMIOPLASTY;  Surgeon: Tobi Bastos, MD;  Location: [facility];  Service: Orthopedics;  Laterality: Right;  . TEE WITHOUT CARDIOVERSION N/A 07/11/2019   Procedure: TRANSESOPHAGEAL ECHOCARDIOGRAM (TEE);  Surgeon: Dixie Dials, MD;  Location: [facility];  Service: Cardiovascular;  Laterality: N/A;  . TONSILLECTOMY       Current Outpatient Medications  Medication Sig Dispense Refill  . ACCU-CHEK AVIVA PLUS test strip USE BID FOR GLUCOSE TESTING  11  . amLODipine (NORVASC) 10 MG tablet Take 10 mg by mouth daily.    Marland Kitchen aspirin 81 MG chewable tablet Chew 1 tablet (81 mg total) by mouth daily. 30 tablet 0  . atorvastatin (LIPITOR) 20 MG tablet Take 20 mg by mouth daily.    . cetirizine (ZYRTEC) 10 MG tablet Take 10 mg by mouth daily.     . clopidogrel (PLAVIX) 75 MG tablet Take 75 mg by mouth daily.    . cyclobenzaprine (FLEXERIL) 5 MG tablet Take 5 mg by mouth at bedtime.     . diclofenac sodium (VOLTAREN) 1 % GEL Apply 2 g topically daily as needed (pain).     . DULoxetine (CYMBALTA) 30 MG capsule Take 30 mg by mouth daily.    . feeding supplement, GLUCERNA SHAKE, (GLUCERNA SHAKE) LIQD Take 237 mLs by mouth 3 (three) times daily between meals. 21330 mL 1  . furosemide (LASIX) 20 MG tablet Take 20 mg by mouth daily.   2  . glimepiride (AMARYL) 4 MG tablet Take 4 mg by mouth daily with breakfast.     . ipratropium-albuterol (DUONEB) 0.5-2.5 (3) MG/3ML SOLN Take 3 mLs by nebulization 2 (two)  times daily. (Patient taking differently: Take 3 mLs by nebulization every 6 (six) hours as needed (sob and wheezing). ) 360 mL 1  . JANUVIA 100 MG tablet Take 100 mg by mouth daily.    Marland Kitchen LANTUS SOLOSTAR 100 UNIT/ML Solostar Pen Inject 30 Units into the skin at bedtime.     Marland Kitchen losartan (COZAAR) 25 MG tablet Take 1 tablet (25 mg total) by mouth daily. 30 tablet 1  . LYRICA 200 MG capsule Take 200 mg by mouth 3 (three) times daily.  1  . metFORMIN (GLUCOPHAGE) 1000 MG tablet Take 1,000 mg by mouth 2 (two) times daily.  0  . omeprazole (PRILOSEC) 20 MG capsule Take 20 mg by mouth daily.     . ondansetron (ZOFRAN) 4 MG tablet Take 1 tablet (4 mg total) by mouth every 6 (six) hours. (Patient taking differently: Take 4 mg by mouth  every 8 (eight) hours as needed for nausea or vomiting. ) 12 tablet 0  . SYMBICORT 80-4.5 MCG/ACT inhaler Inhale 2 puffs into the lungs 2 (two) times daily.    Marland Kitchen tiZANidine (ZANAFLEX) 4 MG tablet Take 4 mg by mouth 2 (two) times daily.    Marland Kitchen topiramate (TOPAMAX) 100 MG tablet Take 100 mg by mouth daily.    . traZODone (DESYREL) 50 MG tablet Take 50 mg by mouth at bedtime.    . TRULICITY 6.29 BM/8.4XL SOPN Take 0.75 mg by mouth every Monday.    . VENTOLIN HFA 108 (90 Base) MCG/ACT inhaler Inhale 2 puffs into the lungs every 4 (four) hours as needed for wheezing or shortness of breath.   5  . allopurinol (ZYLOPRIM) 100 MG tablet Take 1 tablet (100 mg total) by mouth daily. (Patient not taking: Reported on 09/18/2019) 30 tablet 0  . ferrous sulfate 325 (65 FE) MG tablet Take 1 tablet (325 mg total) by mouth daily with breakfast. 30 tablet 0   No current facility-administered medications for this visit.    Allergies:   Patient has no known allergies.   Social History:  The patient  reports that she has never smoked. She has never used smokeless tobacco. She reports current alcohol use. She reports that she does not use drugs.   Family History:  The patient's  family history includes Bone cancer in her maternal uncle; Colon cancer in her father, maternal grandfather, and paternal grandfather.    ROS:  Please see the history of present illness.   All other systems are personally reviewed and negative.    PHYSICAL EXAM: VS:  BP 112/64   Pulse 82   Ht 5\' 4"  (1.626 m)   Wt 173 lb 3.2 oz (78.6 kg)   SpO2 96%   BMI 29.73 kg/m  , BMI Body mass index is 29.73 kg/m. GEN: in a wheelchair today, overweight, in no acute distress HEENT: normal Cardiac: RRR Respiratory:   normal work of breathing GI: soft  MS: no deformity or atrophy Skin: warm and dry  Neuro:  Strength and sensation are intact Psych: euthymic mood, full affect  EKG:  EKGs in epic are reviewed and reveal sinus with first degree  AV block  Prior TEE was unrevealing  Recent Labs: 09/17/2019: ALT 35 09/18/2019: TSH 0.857 09/19/2019: BUN 23; Creatinine, Ser 1.60; Hemoglobin 10.5; Magnesium 1.0; Platelets 104; Potassium 2.8; Sodium 140  personally reviewed   Lipid Panel     Component Value Date/Time   CHOL 91 07/08/2019 0658   TRIG 196 (H) 07/08/2019 0658   HDL  32 (L) 07/08/2019 0658   CHOLHDL 2.8 07/08/2019 0658   VLDL 39 07/08/2019 0658   LDLCALC 20 07/08/2019 0658   personally reviewed   Wt Readings from Last 3 Encounters:  10/26/19 173 lb 3.2 oz (78.6 kg)  10/10/19 175 lb (79.4 kg)  09/22/19 175 lb (79.4 kg)      Other studies personally reviewed: Additional studies/ records that were reviewed today include: Dr Clydene Fake notes, prior ecgs, TEE,  MRI brain 07/07/19 Review of the above records today demonstrates: as above  Assessment and Plan:  1. Cryptogenic stroke The patient presents with cryptogenic stroke. She has worn telemetry with no afib detected.  I spoke at length with the patient about an implantable loop recorder.  Risks, benefits, and alteratives to implantable loop recorder were discussed with the patient today.   At this time, the patient is very clear in their decision to proceed with implantable loop recorder.  Continue ASA and plavix for now  2. Hypertensive cardiovascular disease Stable No change required today  3. HL Continue lipitor   Risks, benefits and potential toxicities for medications prescribed and/or refilled reviewed with patient today.       ILR implant:  Informed written consent was obtained.  The patient required no sedation for the procedure today.  The patients left chest was prepped and draped. Mapping over the patient's chest was performed to identify the appropriate ILR site.  This area was found to be the left parasternal region over the 3rd-4th intercostal space.  The skin overlying this region was infiltrated with lidocaine for local analgesia.  A 0.5-cm incision  was made at the implant site.  A subcutaneous ILR pocket was fashioned using a combination of sharp and blunt dissection.  A Medtronic Reveal Linq model [id number]) implantable loop recorder was then placed into the pocket R waves were very prominent and measured > 0.2 mV. EBL<1 ml.  Steri- Strips and a sterile dressing were then applied.  There were no early apparent complications.     CONCLUSIONS:   1. Successful implantation of a Medtronic Reveal LINQ implantable loop recorder for cryptogenic stroke  2. No early apparent complications.   Thompson Grayer MD, [facility] 10/26/2019 9:50 AM

## 2019-10-27 DIAGNOSIS — N183 Chronic kidney disease, stage 3 unspecified: Secondary | ICD-10-CM | POA: Diagnosis not present

## 2019-10-27 DIAGNOSIS — I69393 Ataxia following cerebral infarction: Secondary | ICD-10-CM | POA: Diagnosis not present

## 2019-10-27 DIAGNOSIS — E1165 Type 2 diabetes mellitus with hyperglycemia: Secondary | ICD-10-CM | POA: Diagnosis not present

## 2019-10-27 DIAGNOSIS — M6281 Muscle weakness (generalized): Secondary | ICD-10-CM | POA: Diagnosis not present

## 2019-10-27 DIAGNOSIS — I5032 Chronic diastolic (congestive) heart failure: Secondary | ICD-10-CM | POA: Diagnosis not present

## 2019-10-27 DIAGNOSIS — I2510 Atherosclerotic heart disease of native coronary artery without angina pectoris: Secondary | ICD-10-CM | POA: Diagnosis not present

## 2019-10-27 DIAGNOSIS — I130 Hypertensive heart and chronic kidney disease with heart failure and stage 1 through stage 4 chronic kidney disease, or unspecified chronic kidney disease: Secondary | ICD-10-CM | POA: Diagnosis not present

## 2019-10-27 DIAGNOSIS — G4733 Obstructive sleep apnea (adult) (pediatric): Secondary | ICD-10-CM | POA: Diagnosis not present

## 2019-10-27 DIAGNOSIS — E1122 Type 2 diabetes mellitus with diabetic chronic kidney disease: Secondary | ICD-10-CM | POA: Diagnosis not present

## 2019-11-01 DIAGNOSIS — M25559 Pain in unspecified hip: Secondary | ICD-10-CM | POA: Diagnosis not present

## 2019-11-01 DIAGNOSIS — M1990 Unspecified osteoarthritis, unspecified site: Secondary | ICD-10-CM | POA: Diagnosis not present

## 2019-11-01 DIAGNOSIS — M48062 Spinal stenosis, lumbar region with neurogenic claudication: Secondary | ICD-10-CM | POA: Diagnosis not present

## 2019-11-01 DIAGNOSIS — J45909 Unspecified asthma, uncomplicated: Secondary | ICD-10-CM | POA: Diagnosis not present

## 2019-11-01 DIAGNOSIS — M179 Osteoarthritis of knee, unspecified: Secondary | ICD-10-CM | POA: Diagnosis not present

## 2019-11-01 DIAGNOSIS — E1140 Type 2 diabetes mellitus with diabetic neuropathy, unspecified: Secondary | ICD-10-CM | POA: Diagnosis not present

## 2019-11-01 DIAGNOSIS — Z8249 Family history of ischemic heart disease and other diseases of the circulatory system: Secondary | ICD-10-CM | POA: Diagnosis not present

## 2019-11-01 DIAGNOSIS — G4730 Sleep apnea, unspecified: Secondary | ICD-10-CM | POA: Diagnosis not present

## 2019-11-01 DIAGNOSIS — G4700 Insomnia, unspecified: Secondary | ICD-10-CM | POA: Diagnosis not present

## 2019-11-01 DIAGNOSIS — G894 Chronic pain syndrome: Secondary | ICD-10-CM | POA: Diagnosis not present

## 2019-11-07 DIAGNOSIS — N184 Chronic kidney disease, stage 4 (severe): Secondary | ICD-10-CM | POA: Diagnosis not present

## 2019-11-07 DIAGNOSIS — G4733 Obstructive sleep apnea (adult) (pediatric): Secondary | ICD-10-CM | POA: Diagnosis not present

## 2019-11-10 ENCOUNTER — Other Ambulatory Visit: Payer: Self-pay

## 2019-11-10 ENCOUNTER — Ambulatory Visit (INDEPENDENT_AMBULATORY_CARE_PROVIDER_SITE_OTHER): Payer: Medicare Other | Admitting: Emergency Medicine

## 2019-11-10 DIAGNOSIS — I639 Cerebral infarction, unspecified: Secondary | ICD-10-CM

## 2019-11-10 LAB — CUP PACEART INCLINIC DEVICE CHECK
Date Time Interrogation Session: 20210527151033
Implantable Pulse Generator Implant Date: 20210512

## 2019-11-10 NOTE — Progress Notes (Signed)
ILR wound check in clinic. Steri strips removed. Wound well healed. R-waves 0.8 mV. Home monitor transmitting nightly. No episodes. Questions answered.

## 2019-11-15 ENCOUNTER — Ambulatory Visit
Admission: RE | Admit: 2019-11-15 | Discharge: 2019-11-15 | Disposition: A | Discharge status: Home / Self Care | Payer: Medicare Other | Source: Ambulatory Visit | Attending: Orthopedic Surgery | Admitting: Orthopedic Surgery

## 2019-11-15 ENCOUNTER — Other Ambulatory Visit: Payer: Self-pay

## 2019-11-15 DIAGNOSIS — M48061 Spinal stenosis, lumbar region without neurogenic claudication: Secondary | ICD-10-CM | POA: Diagnosis not present

## 2019-11-15 DIAGNOSIS — M5410 Radiculopathy, site unspecified: Secondary | ICD-10-CM

## 2019-11-15 MED ORDER — GADOBENATE DIMEGLUMINE 529 MG/ML IV SOLN
15.0000 mL | Freq: Once | INTRAVENOUS | Status: AC | PRN
  Administered 2019-11-15: 15 mL via INTRAVENOUS

## 2019-11-17 ENCOUNTER — Ambulatory Visit: Payer: Medicare Other | Admitting: Orthopedic Surgery

## 2019-11-18 DIAGNOSIS — E119 Type 2 diabetes mellitus without complications: Secondary | ICD-10-CM | POA: Diagnosis not present

## 2019-11-22 DIAGNOSIS — Z1231 Encounter for screening mammogram for malignant neoplasm of breast: Secondary | ICD-10-CM | POA: Diagnosis not present

## 2019-11-23 ENCOUNTER — Ambulatory Visit (INDEPENDENT_AMBULATORY_CARE_PROVIDER_SITE_OTHER): Payer: Medicare Other | Admitting: Orthopedic Surgery

## 2019-11-23 DIAGNOSIS — M5410 Radiculopathy, site unspecified: Secondary | ICD-10-CM

## 2019-11-23 DIAGNOSIS — G8929 Other chronic pain: Secondary | ICD-10-CM | POA: Diagnosis not present

## 2019-11-23 DIAGNOSIS — M25561 Pain in right knee: Secondary | ICD-10-CM | POA: Diagnosis not present

## 2019-11-23 MED ORDER — GABAPENTIN 100 MG PO CAPS: 100.0000 mg | ORAL_CAPSULE | Freq: Two times a day (BID) | ORAL | 0 refills | Status: DC

## 2019-11-25 ENCOUNTER — Encounter: Payer: Self-pay | Admitting: Orthopedic Surgery

## 2019-11-25 NOTE — Progress Notes (Signed)
Office Visit Note   Patient: [name]           Date of Birth: Oct 20, 1950           MRN: [id number] Visit Date: 11/23/2019 Requested by: [name], FNP [place],  [place] [address] PCP: [name], FNP   Subjective: Chief Complaint  Patient presents with  . Follow-up    HPI: Anastasija is a patient with low back pain.  She also had a right knee injection which did not help her much.  Since have seen her she describes thigh burning.  She has had therapy.  MRI scan of the lumbar spine shows L4 S1 posterior interbody fusion with no residual spinal canal or neuroforaminal stenosis.  There is some new mild spinal canal stenosis at L2-3 and unchanged mild L3-4 spinal canal stenosis.  In general she is getting some adjacent segment disease.              ROS: All systems reviewed are negative as they relate to the chief complaint within the history of present illness.  Patient denies  fevers or chills.   Assessment & Plan: Visit Diagnoses:  1. Radiculopathy of leg     Plan: Impression is no help from right knee injection.  She is likely getting some upper leg symptoms from L2-3 and L3-4 mild spinal stenosis.  Plan is Neurontin along with physical therapy for her low back pain 1 time a week for 6 weeks plus a home exercise program as well as referral to Dr. Ernestina Patches for L spine ESI for L2-3 stenosis which is likely the culprit in this case.  Follow-Up Instructions: No follow-ups on file.   Orders:  Orders Placed This Encounter  Procedures  . Ambulatory referral to Physical Medicine Rehab  . Ambulatory referral to Physical Therapy   Meds ordered this encounter  Medications  . gabapentin (NEURONTIN) 100 MG capsule    Sig: Take 1 capsule (100 mg total) by mouth 2 (two) times daily.    Dispense:  60 capsule    Refill:  0      Procedures: No procedures performed   Clinical Data: No additional findings.  Objective: Vital Signs: There were no  vitals taken for this visit.  Physical Exam:   Constitutional: Patient appears well-developed HEENT:  Head: Normocephalic Eyes:EOM are normal Neck: Normal range of motion Cardiovascular: Normal rate Pulmonary/chest: Effort normal Neurologic: Patient is alert Skin: Skin is warm Psychiatric: Patient has normal mood and affect    Ortho Exam: Ortho exam demonstrates no knee effusion with full range of motion bilaterally.  Stable collateral and cruciate ligaments.  Mild pain with forward lateral bending.  No trochanteric tenderness is noted.  No definite paresthesias L1 S1 bilaterally.  No muscle atrophy in the legs.  Specialty Comments:  No specialty comments available.  Imaging: No results found.   PMFS History: Patient Active Problem List   Diagnosis Date Noted  . Idiopathic hypotension   . Ataxia 09/18/2019  . Cerebral embolism with cerebral infarction 07/08/2019  . Acute kidney injury (nontraumatic) ([place]) 07/07/2019  . Anemia associated with chronic renal failure 07/07/2019  . Acute bronchiolitis 10/10/2017  . Shortness of breath 10/21/2015  . Acute bronchitis and bronchiolitis 10/21/2015  . Lactic acidosis 10/21/2015  . Benign essential HTN 10/21/2015  . GERD (gastroesophageal reflux disease) 10/21/2015  . 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 Acute renal failure superimposed on stage 3 chronic kidney disease (  [place]) 10/21/2015  . Chronic arthritis 10/21/2015  . SOB (shortness of breath) 10/21/2015  . OSA (obstructive sleep apnea) 10/11/2012  . Rotator cuff tear, non-traumatic 08/25/2012   Past Medical History:  Diagnosis Date  . Arthritis   . Diabetes mellitus without complication ([place])   . GERD (gastroesophageal reflux disease)   . H/O hiatal hernia   . Headache(784.0)   . Hyperlipidemia   . Hypertension   . Neuromuscular disorder (HCC)    neuropathy feet   . Sleep apnea    recently diagnosed- wears cpap    Family History  Problem  Relation Age of Onset  . Colon cancer Father   . Colon cancer Paternal Grandfather   . Colon cancer Maternal Grandfather   . Bone cancer Maternal Uncle   . Colon polyps Neg Hx   . Esophageal cancer Neg Hx   . Rectal cancer Neg Hx   . Stomach cancer Neg Hx     Past Surgical History:  Procedure Laterality Date  . ABDOMINAL HYSTERECTOMY     partial  . BACK SURGERY    . BUBBLE STUDY  07/11/2019   Procedure: BUBBLE STUDY;  Surgeon: Dixie Dials, MD;  Location: [place];  Service: Cardiovascular;;  . CHOLECYSTECTOMY    . COLONOSCOPY     age 88- doesnt know where or who   . implantable loop recorder placement  10/26/2019   Medtronic Reveal [place] model [phone]) implantable loop recorder implanted by Dr Rayann Heman for cryptogenic stroke  . left shoulder     x 2 surgeries  . right shoulder     x2  . SHOULDER OPEN ROTATOR CUFF REPAIR Right 08/25/2012   Procedure: ROTATOR CUFF REPAIR SHOULDER OPEN WITH ACROMIOPLASTY;  Surgeon: Tobi Bastos, MD;  Location: [facility];  Service: Orthopedics;  Laterality: Right;  . TEE WITHOUT CARDIOVERSION N/A 07/11/2019   Procedure: TRANSESOPHAGEAL ECHOCARDIOGRAM (TEE);  Surgeon: Dixie Dials, MD;  Location: [facility] END[facility]COPY;  Service: Cardiovascular;  Laterality: N/A;  . TONSILLECTOMY     Social History   Occupational History  . Occupation: Haematologist: [employer]  Tobacco Use  . Smoking status: Never Smoker  . Smokeless tobacco: Never Used  Vaping Use  . Vaping Use: Never used  Substance and Sexual Activity  . Alcohol use: Yes    Alcohol/week: 0.0 standard drinks    Comment: rarely  . Drug use: No  . Sexual activity: Not Currently

## 2019-11-27 ENCOUNTER — Encounter: Payer: Self-pay | Admitting: Orthopedic Surgery

## 2019-11-28 ENCOUNTER — Ambulatory Visit (INDEPENDENT_AMBULATORY_CARE_PROVIDER_SITE_OTHER): Payer: Medicare Other | Admitting: *Deleted

## 2019-11-28 DIAGNOSIS — I6310 Cerebral infarction due to embolism of unspecified precerebral artery: Secondary | ICD-10-CM

## 2019-11-29 LAB — CUP PACEART REMOTE DEVICE CHECK
Date Time Interrogation Session: 20210614002929
Implantable Pulse Generator Implant Date: 20210512

## 2019-11-29 NOTE — Progress Notes (Signed)
Carelink Summary Report / Loop Recorder 

## 2019-12-13 DIAGNOSIS — M25561 Pain in right knee: Secondary | ICD-10-CM | POA: Diagnosis not present

## 2019-12-13 DIAGNOSIS — G894 Chronic pain syndrome: Secondary | ICD-10-CM | POA: Diagnosis not present

## 2019-12-13 DIAGNOSIS — M79672 Pain in left foot: Secondary | ICD-10-CM | POA: Diagnosis not present

## 2019-12-13 DIAGNOSIS — Z79899 Other long term (current) drug therapy: Secondary | ICD-10-CM | POA: Diagnosis not present

## 2019-12-13 DIAGNOSIS — M79671 Pain in right foot: Secondary | ICD-10-CM | POA: Diagnosis not present

## 2019-12-13 DIAGNOSIS — M25562 Pain in left knee: Secondary | ICD-10-CM | POA: Diagnosis not present

## 2019-12-19 ENCOUNTER — Other Ambulatory Visit: Payer: Self-pay | Admitting: Orthopedic Surgery

## 2019-12-20 NOTE — Telephone Encounter (Signed)
Pls advise.  

## 2020-01-02 ENCOUNTER — Ambulatory Visit (INDEPENDENT_AMBULATORY_CARE_PROVIDER_SITE_OTHER): Payer: Medicare (Managed Care) | Admitting: *Deleted

## 2020-01-02 DIAGNOSIS — I639 Cerebral infarction, unspecified: Secondary | ICD-10-CM | POA: Diagnosis not present

## 2020-01-02 LAB — CUP PACEART REMOTE DEVICE CHECK
Date Time Interrogation Session: 20210718231152
Implantable Pulse Generator Implant Date: 20210512

## 2020-01-04 NOTE — Progress Notes (Signed)
Carelink Summary Report / Loop Recorder 

## 2020-01-10 ENCOUNTER — Other Ambulatory Visit: Payer: Self-pay | Admitting: Surgical

## 2020-01-11 NOTE — Telephone Encounter (Signed)
Pls advise.  

## 2020-01-19 ENCOUNTER — Encounter (INDEPENDENT_AMBULATORY_CARE_PROVIDER_SITE_OTHER): Payer: Medicare (Managed Care) | Admitting: Ophthalmology

## 2020-02-06 ENCOUNTER — Ambulatory Visit (INDEPENDENT_AMBULATORY_CARE_PROVIDER_SITE_OTHER): Payer: Medicare (Managed Care) | Admitting: *Deleted

## 2020-02-06 ENCOUNTER — Other Ambulatory Visit: Payer: Self-pay | Admitting: Surgical

## 2020-02-06 DIAGNOSIS — I6310 Cerebral infarction due to embolism of unspecified precerebral artery: Secondary | ICD-10-CM | POA: Diagnosis not present

## 2020-02-06 LAB — CUP PACEART REMOTE DEVICE CHECK
Date Time Interrogation Session: 20210820231214
Implantable Pulse Generator Implant Date: 20210512

## 2020-02-06 NOTE — Telephone Encounter (Signed)
Pls advise. Thanks.  

## 2020-02-08 ENCOUNTER — Other Ambulatory Visit: Payer: Self-pay | Admitting: Surgical

## 2020-02-13 NOTE — Progress Notes (Signed)
Carelink Summary Report / Loop Recorder 

## 2020-02-19 IMAGING — CT CT HEAD W/O CM
4 series · 17 of 47 positions shown, 19 images · non-contrast
Comparison: Head CT 05/31/2019

CLINICAL DATA: Ataxia.

EXAM:
CT HEAD WITHOUT CONTRAST
TECHNIQUE: Contiguous axial images were obtained from the base of the skull
through the vertex without intravenous contrast.

[Series 3: head wo · axial · 0.40mm/px · z∈[-158,-38]mm · 7 of 34 slices shown, 9 images]
[im 5/34  brain]
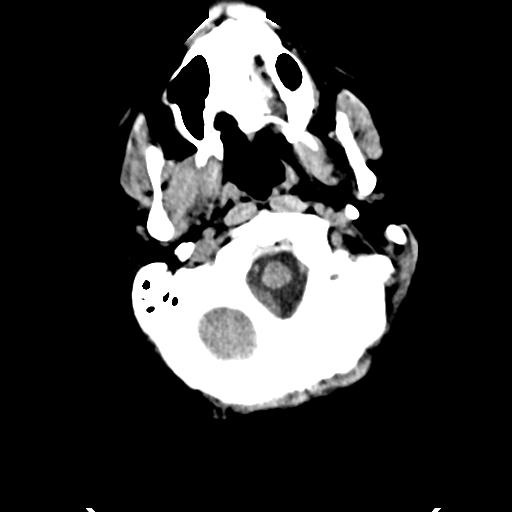
[im 5/34  bone]
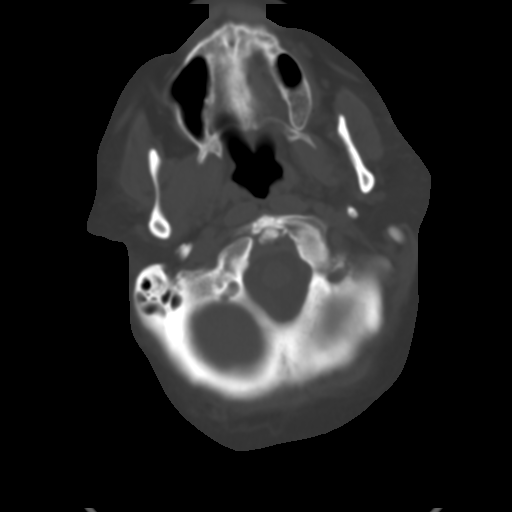
[im 9/34  brain]
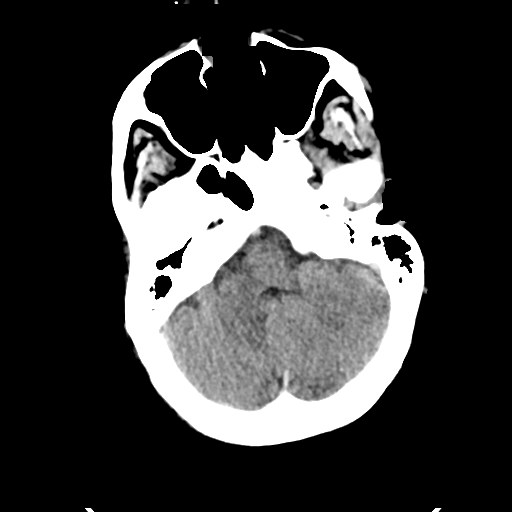
[im 13/34  brain]
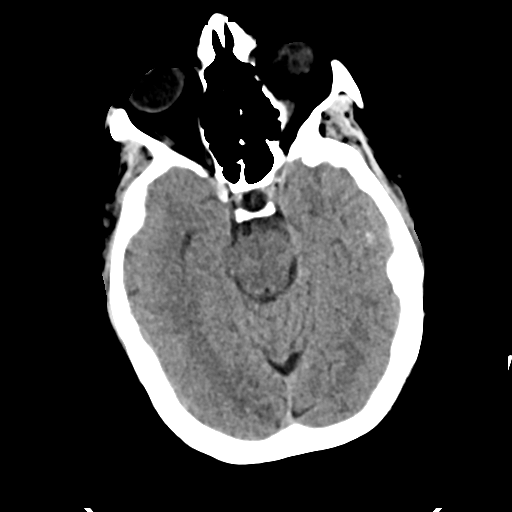
[im 17/34  brain]
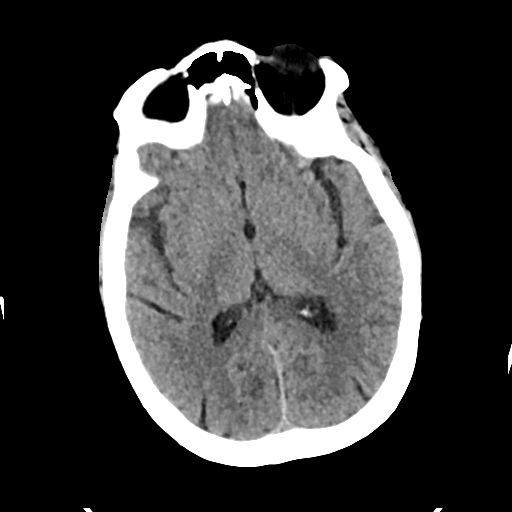
[im 21/34  brain]
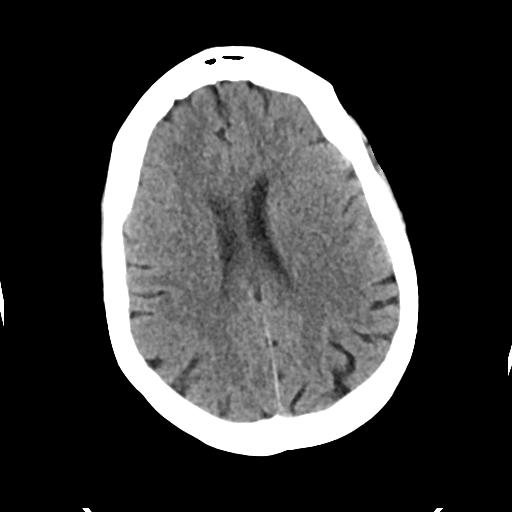
[im 21/34  bone]
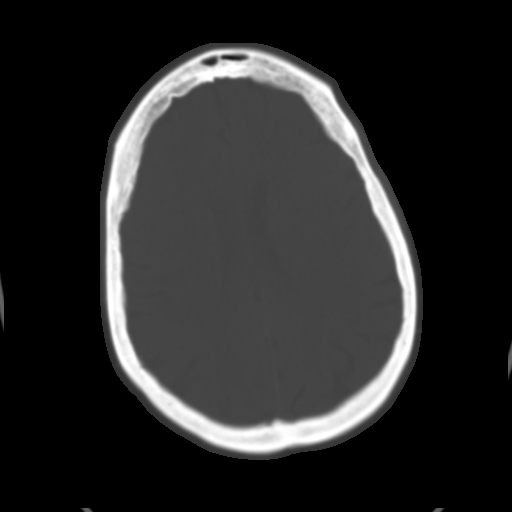
[im 25/34  brain]
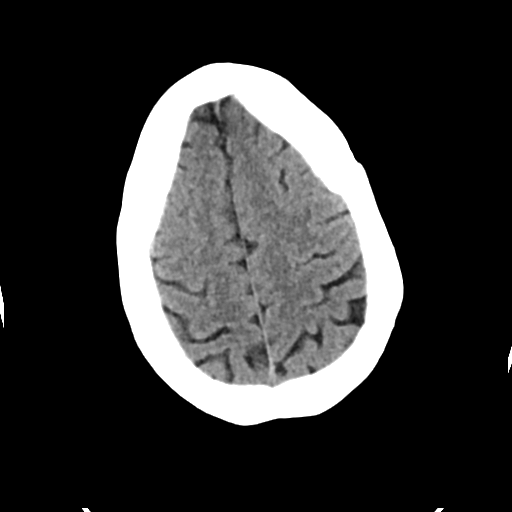
[im 29/34  brain]
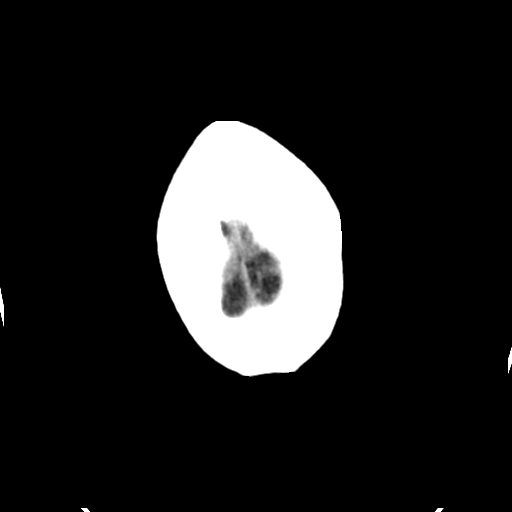

[Series 4: head bone · axial · 0.40mm/px · z∈[-162,-104]mm · 4 of 85 slices shown]
[im 9/85  bone]
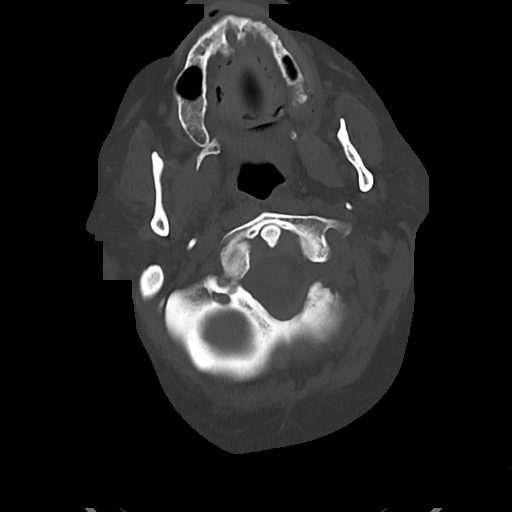
[im 17/85  bone]
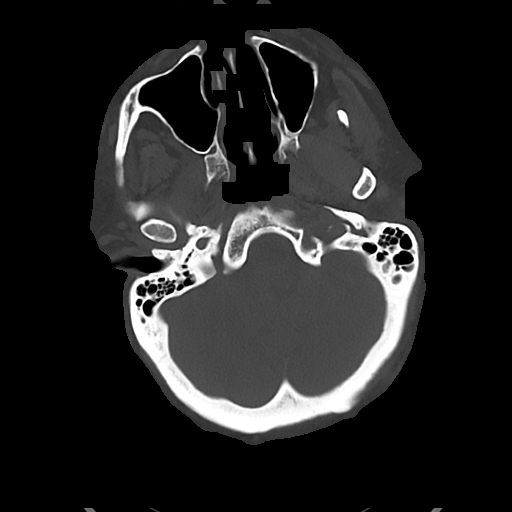
[im 26/85  bone]
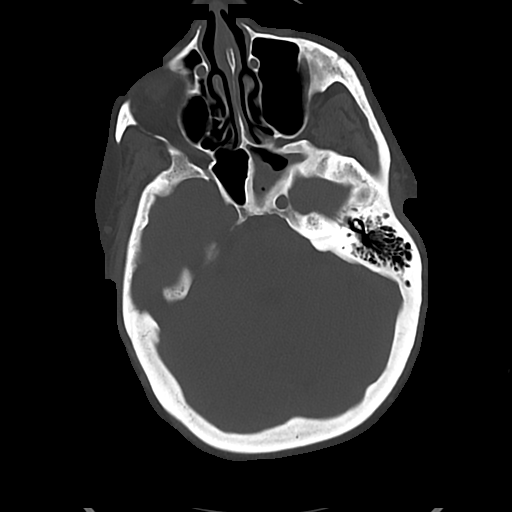
[im 38/85  bone]
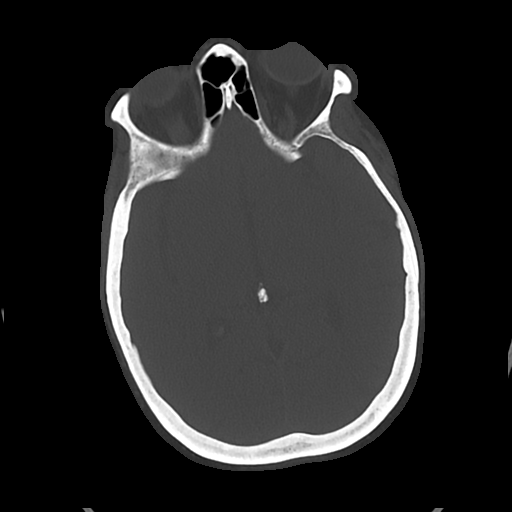

[Series 5: cor soft · coronal · 0.33mm/px · 3 of 66 slices shown]
[im 22/66  brain]
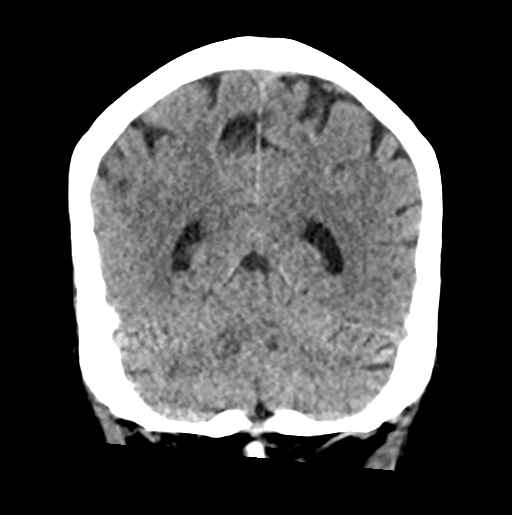
[im 29/66  brain]
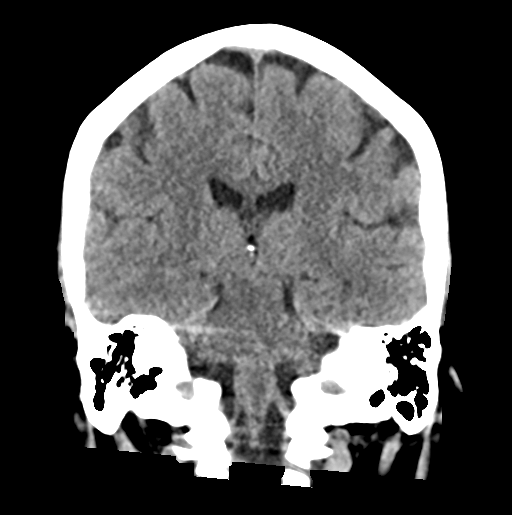
[im 37/66  brain]
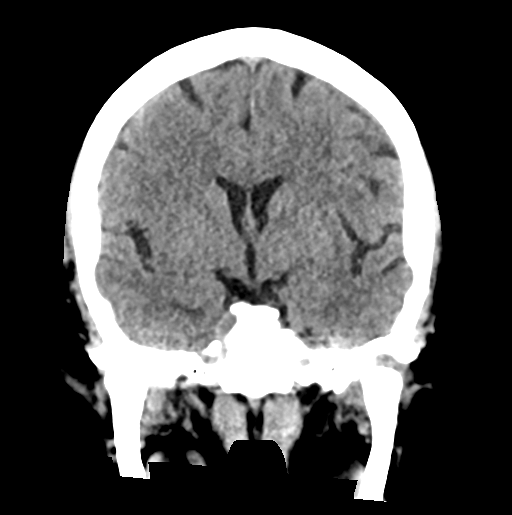

[Series 6: sag soft · sagittal · 0.33mm/px · 3 of 51 slices shown]
[im 17/51  brain]
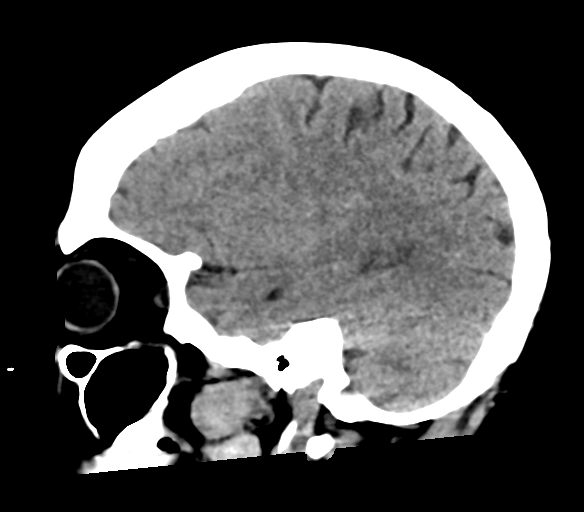
[im 26/51  brain]
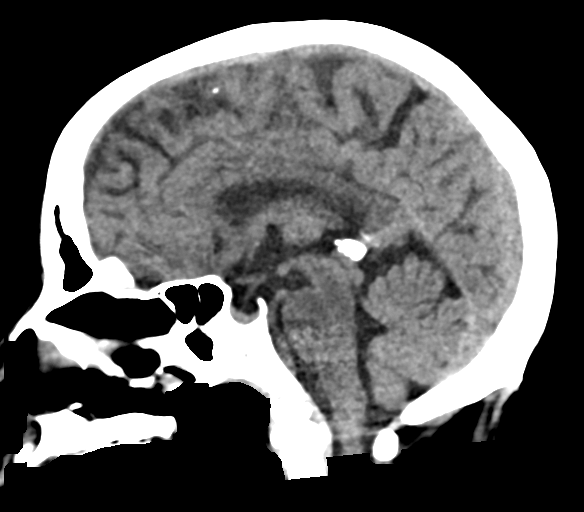
[im 34/51  brain]
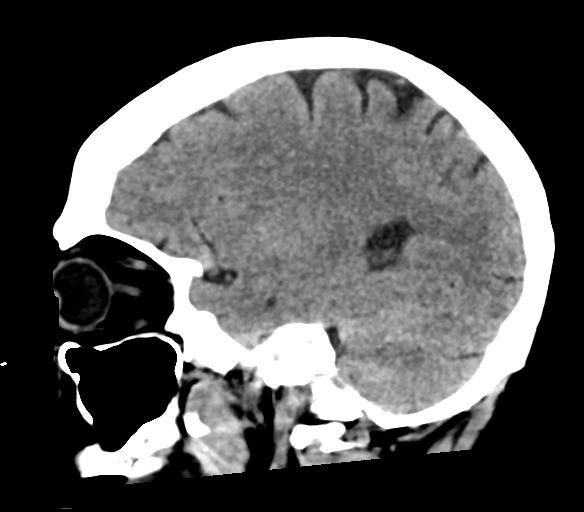

[17 of 47 positions shown; findings below may reference images not displayed]

FINDINGS: Brain: Normal brain volume for age. No intracranial hemorrhage, mass
effect, or midline shift. No hydrocephalus. The basilar cisterns are
patent. No evidence of territorial infarct or acute ischemia. No
extra-axial or intracranial fluid collection.

Vascular: No hyperdense vessel or unexpected calcification.

Skull: No fracture or focal lesion.

Sinuses/Orbits: Chronic left sphenoid sinus sinusitis. Acute
findings.

Other: None.
IMPRESSION: 1. No acute intracranial abnormality.
2. Chronic left sphenoid sinusitis.

## 2020-03-01 ENCOUNTER — Other Ambulatory Visit: Payer: Self-pay

## 2020-03-01 ENCOUNTER — Ambulatory Visit
Admission: RE | Admit: 2020-03-01 | Discharge: 2020-03-01 | Disposition: A | Discharge status: Home / Self Care | Payer: Medicare Other | Source: Ambulatory Visit | Attending: Family | Admitting: Family

## 2020-03-01 ENCOUNTER — Other Ambulatory Visit: Payer: Self-pay | Admitting: Family

## 2020-03-01 DIAGNOSIS — R0989 Other specified symptoms and signs involving the circulatory and respiratory systems: Secondary | ICD-10-CM

## 2020-03-11 LAB — CUP PACEART REMOTE DEVICE CHECK
Date Time Interrogation Session: 20210922233433
Implantable Pulse Generator Implant Date: 20210512

## 2020-03-12 ENCOUNTER — Ambulatory Visit (INDEPENDENT_AMBULATORY_CARE_PROVIDER_SITE_OTHER): Payer: Medicare Other | Admitting: Emergency Medicine

## 2020-03-12 DIAGNOSIS — I6310 Cerebral infarction due to embolism of unspecified precerebral artery: Secondary | ICD-10-CM

## 2020-03-15 NOTE — Progress Notes (Signed)
Carelink Summary Report / Loop Recorder 

## 2020-03-27 ENCOUNTER — Ambulatory Visit: Payer: Medicare Other | Admitting: Adult Health

## 2020-04-12 ENCOUNTER — Other Ambulatory Visit: Payer: Self-pay

## 2020-04-12 ENCOUNTER — Ambulatory Visit (INDEPENDENT_AMBULATORY_CARE_PROVIDER_SITE_OTHER): Payer: Medicare Other | Admitting: Adult Health

## 2020-04-12 ENCOUNTER — Encounter: Payer: Self-pay | Admitting: Adult Health

## 2020-04-12 VITALS — BP 101/66 | HR 86 | Ht 62.0 in | Wt 160.0 lb

## 2020-04-12 DIAGNOSIS — I10 Essential (primary) hypertension: Secondary | ICD-10-CM | POA: Diagnosis not present

## 2020-04-12 DIAGNOSIS — E785 Hyperlipidemia, unspecified: Secondary | ICD-10-CM | POA: Diagnosis not present

## 2020-04-12 DIAGNOSIS — I639 Cerebral infarction, unspecified: Secondary | ICD-10-CM

## 2020-04-12 DIAGNOSIS — E1121 Type 2 diabetes mellitus with diabetic nephropathy: Secondary | ICD-10-CM

## 2020-04-12 NOTE — Progress Notes (Signed)
[facility] [address] [place]. [name] [address] [phone]       OFFICE FOLLOW-UP NOTE  [name] Date of Birth:  05-23-1951 Medical Record Number:  [id number]    CC: Stroke f/u  HPI:   Today, 04/12/2020, [name] returns for stroke follow-up accompanied by her daughter.  She has been stable since prior visit without residual deficits and denies new or reoccurring stroke/TIA symptoms.  She remains on aspirin and atorvastatin for secondary stroke prevention without side effects.  Blood pressure today 101/66.  Routinely monitors at home which has been stable.  Glucose levels stable at home typically ranging 85-100.  Loop recorder implanted by Dr. Rayann Levine on 10/26/2019 which has not shown atrial fibrillation thus far.  No concerns at this time.    History provided for reference purposes only Initial visit 09/22/2019 Dr. Leonie Levine: [name] is a 69 year old African-American lady seen today for initial office follow-up visit following hospital consult for stroke in January 2021. She has past medical history of obesity, sleep apnea, neuropathy, hypertension, hyperlipidemia, diabetes who presented to [facility] due to generalized weakness. The patient's grandson stated that she had episodes of passing out and difficulty with walking following episode of diarrhea few days prior to admission. She was found to be hypotensive in the emergency room with blood pressure 80 systolic. She was given fluid boluses and creatinine on admission was 5.6. She was also found to have mild confusion and hence an MRI scan of the brain was obtained which showed a small patchy left occipital embolic infarct. Patient had no focal neurological deficits related to this. She improved with IV fluids and hydration. Carotid ultrasound showed moderate 40 to 59% left ICA and mild right-sided carotid stenosis. Transcranial Doppler study showed diffuse increase in pulsatility  indicis bilaterally. Transthoracic echo was unremarkable. LDL cholesterol was low at 20 mg percent. Hemoglobin A1c was elevated at 12.3. Transesophageal echocardiogram showed no cardiac source of embolism or PFO. Patient was started on dual antiplatelet therapy aspirin and Plavix for 3 weeks and is currently on aspirin alone. Plans were to get a loop recorder placed but that has not yet been done. Patient states she has done well without recurrent stroke or TIA symptoms. However she was readmitted on 09/17/2019 with dehydration and hypotension with blood pressure 88 systolic and creatinine of 2.9. She had another MRI scan of the brain performed on 09/19/2019 which showed no acute infarct. Her hypotension responded well to fluids. She states she is to remain on aspirin is tolerating well. Blood pressure remains low and today it is 106/62. She is tolerating Lipitor well without muscle aches and pains. She complains of tingling numbness and burning in her feet and has chronic peripheral neuropathy related to her diabetes which is stable. She has not yet had loop recorder placed for paroxysmal A. Fib    ROS:   14 system review of systems is positive for no complaints and all other systems negative PMH:  Past Medical History:  Diagnosis Date  . Arthritis   . Diabetes mellitus without complication ([place])   . GERD (gastroesophageal reflux disease)   . H/O hiatal hernia   . Headache(784.0)   . Hyperlipidemia   . Hypertension   . Neuromuscular disorder (HCC)    neuropathy feet   . Sleep apnea    recently diagnosed- wears cpap    Social History:  Social History   Socioeconomic History  . Marital status: Divorced    Spouse  name: Not on file  . Number of children: 3  . Years of education: Not on file  . Highest education level: Not on file  Occupational History  . Occupation: Haematologist: [employer]  Tobacco Use  . Smoking status: Never Smoker  . Smokeless tobacco: Never Used  Vaping Use   . Vaping Use: Never used  Substance and Sexual Activity  . Alcohol use: Yes    Alcohol/week: 0.0 standard drinks    Comment: rarely  . Drug use: No  . Sexual activity: Not Currently  Other Topics Concern  . Not on file  Social History Narrative  . Not on file   Social Determinants of Health   Financial Resource Strain:   . Difficulty of Paying Living Expenses: Not on file  Food Insecurity:   . Worried About Charity fundraiser in the Last Year: Not on file  . Ran Out of Food in the Last Year: Not on file  Transportation Needs:   . Lack of Transportation (Medical): Not on file  . Lack of Transportation (Non-Medical): Not on file  Physical Activity:   . Days of Exercise per Week: Not on file  . Minutes of Exercise per Session: Not on file  Stress:   . Feeling of Stress : Not on file  Social Connections:   . Frequency of Communication with Friends and Family: Not on file  . Frequency of Social Gatherings with Friends and Family: Not on file  . Attends Religious Services: Not on file  . Active Member of Clubs or Organizations: Not on file  . Attends Archivist Meetings: Not on file  . Marital Status: Not on file  Intimate Partner Violence:   . Fear of Current or Ex-Partner: Not on file  . Emotionally Abused: Not on file  . Physically Abused: Not on file  . Sexually Abused: Not on file    Medications:   Current Outpatient Medications on File Prior to Visit  Medication Sig Dispense Refill  . ACCU-CHEK AVIVA PLUS test strip USE BID FOR GLUCOSE TESTING  11  . amLODipine (NORVASC) 10 MG tablet Take 10 mg by mouth daily.    Marland Kitchen aspirin 81 MG chewable tablet Chew 1 tablet (81 mg total) by mouth daily. 30 tablet 0  . atorvastatin (LIPITOR) 20 MG tablet Take 20 mg by mouth daily.    . cyclobenzaprine (FLEXERIL) 5 MG tablet Take 5 mg by mouth at bedtime.     . diclofenac sodium (VOLTAREN) 1 % GEL Apply 2 g topically daily as needed (pain).     . feeding supplement,  GLUCERNA SHAKE, (GLUCERNA SHAKE) LIQD Take 237 mLs by mouth 3 (three) times daily between meals. 21330 mL 1  . gabapentin (NEURONTIN) 100 MG capsule TAKE 1 CAPSULE(100 MG) BY MOUTH TWICE DAILY 60 capsule 0  . glimepiride (AMARYL) 4 MG tablet Take 4 mg by mouth daily with breakfast.     . losartan (COZAAR) 25 MG tablet Take 1 tablet (25 mg total) by mouth daily. 30 tablet 1  . metFORMIN (GLUCOPHAGE) 1000 MG tablet Take 1,000 mg by mouth 2 (two) times daily.  0  . omeprazole (PRILOSEC) 20 MG capsule Take 20 mg by mouth daily.     . SYMBICORT 80-4.5 MCG/ACT inhaler Inhale 2 puffs into the lungs 2 (two) times daily.    Marland Kitchen tiZANidine (ZANAFLEX) 4 MG tablet Take 4 mg by mouth 2 (two) times daily.    Marland Kitchen topiramate (TOPAMAX)  100 MG tablet Take 100 mg by mouth daily.    . TRULICITY 8.65 HQ/4.6NG SOPN Take 0.75 mg by mouth every Monday.    . VENTOLIN HFA 108 (90 Base) MCG/ACT inhaler Inhale 2 puffs into the lungs every 4 (four) hours as needed for wheezing or shortness of breath.   5  . allopurinol (ZYLOPRIM) 100 MG tablet Take 1 tablet (100 mg total) by mouth daily. (Patient not taking: Reported on 09/18/2019) 30 tablet 0  . ferrous sulfate 325 (65 FE) MG tablet Take 1 tablet (325 mg total) by mouth daily with breakfast. 30 tablet 0   No current facility-administered medications on file prior to visit.    Allergies:  No Known Allergies  Physical Exam Today's Vitals   04/12/20 1425  BP: 101/66  Pulse: 86  Weight: 160 lb (72.6 kg)  Height: 5\' 2"  (1.575 m)   Body mass index is 29.26 kg/m.   General: Obese middle-aged African-American lady seated, in no evident distress Head: head normocephalic and atraumatic.  Neck: supple with no carotid or supraclavicular bruits Cardiovascular: regular rate and rhythm, no murmurs Musculoskeletal: no deformity Skin:  no rash/petichiae Vascular:  Normal pulses all extremities  Neurologic Exam Mental Status: Awake and fully alert. Oriented to place and time.  Recent and remote memory intact. Attention span, concentration and fund of knowledge appropriate. Mood and affect appropriate.  Cranial Nerves: Pupils equal, briskly reactive to light. Extraocular movements full without nystagmus. Visual fields full to confrontation. Hearing intact. Facial sensation intact. Face, tongue, palate moves normally and symmetrically.  Motor: Normal bulk and tone. Normal strength in all tested extremity muscles. Sensory.: intact to touch ,pinprick .but diminished position and vibratory sensation in both feet from ankle down.  Coordination: Rapid alternating movements normal in all extremities. Finger-to-nose and heel-to-shin performed accurately bilaterally. Gait and Station: Arises from chair without difficulty. Stance is broad-based. Gait demonstrates mild imbalance and broad-based. Difficulty performing heel, toe and tandem walk  Reflexes: 1+ and symmetric. Toes downgoing.      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  Loop recorder placed on 10/26/2019 by Dr. Rayann Levine which has not shown atrial fibrillation thus far.  Vascular risk factors of diabetes, hypertension hyperlipidemia and mild obesity   -Continue aspirin 81 mg daily and atorvastatin for secondary stroke prevention  -Loop recorder will continue to be monitored for possible atrial fibrillation -Discussed secondary stroke prevention measures and and importance of close PCP follow-up with maintaining strict control of hypertension with blood pressure goal below 130/90, diabetes with hemoglobin A1c goal below 7% and lipids with LDL cholesterol goal below 70 mg/dL.   Follow-up in 6 months or call earlier if needed  CC:  [facility] provider: Dr. Darden Dates, Malva Limes., FNP    I spent 30 minutes of face-to-face and non-face-to-face time with patient and daughter.  This included previsit chart review, lab review, study  review, order entry, electronic health record documentation, patient education and discussion regarding prior stroke, placement of loop recorder and indication, secondary stroke prevention measures and importance of managing stroke risk factors and answered all other questions to patient and daughter satisfaction   Frann Rider, AGNP-BC  [facility] [address] [place], [place] [address]  Phone [phone] Fax [phone] Note: This document was prepared with digital dictation and possible smart phrase technology. Any transcriptional errors that result from this process are unintentional.

## 2020-04-13 NOTE — Progress Notes (Signed)
I agree with the above plan 

## 2020-04-14 LAB — CUP PACEART REMOTE DEVICE CHECK
Date Time Interrogation Session: 20211026000217
Implantable Pulse Generator Implant Date: 20210512

## 2020-04-16 ENCOUNTER — Ambulatory Visit (INDEPENDENT_AMBULATORY_CARE_PROVIDER_SITE_OTHER): Payer: Medicare Other

## 2020-04-16 DIAGNOSIS — I639 Cerebral infarction, unspecified: Secondary | ICD-10-CM | POA: Diagnosis not present

## 2020-04-19 NOTE — Progress Notes (Signed)
Carelink Summary Report / Loop Recorder 

## 2020-05-02 IMAGING — DX DG CHEST 1V PORT
1 series · 1 of 1 positions shown · non-contrast
Comparison: 07/06/2019

CLINICAL DATA: Syncopal episode and weakness

EXAM:
PORTABLE CHEST 1 VIEW

[chest ap]
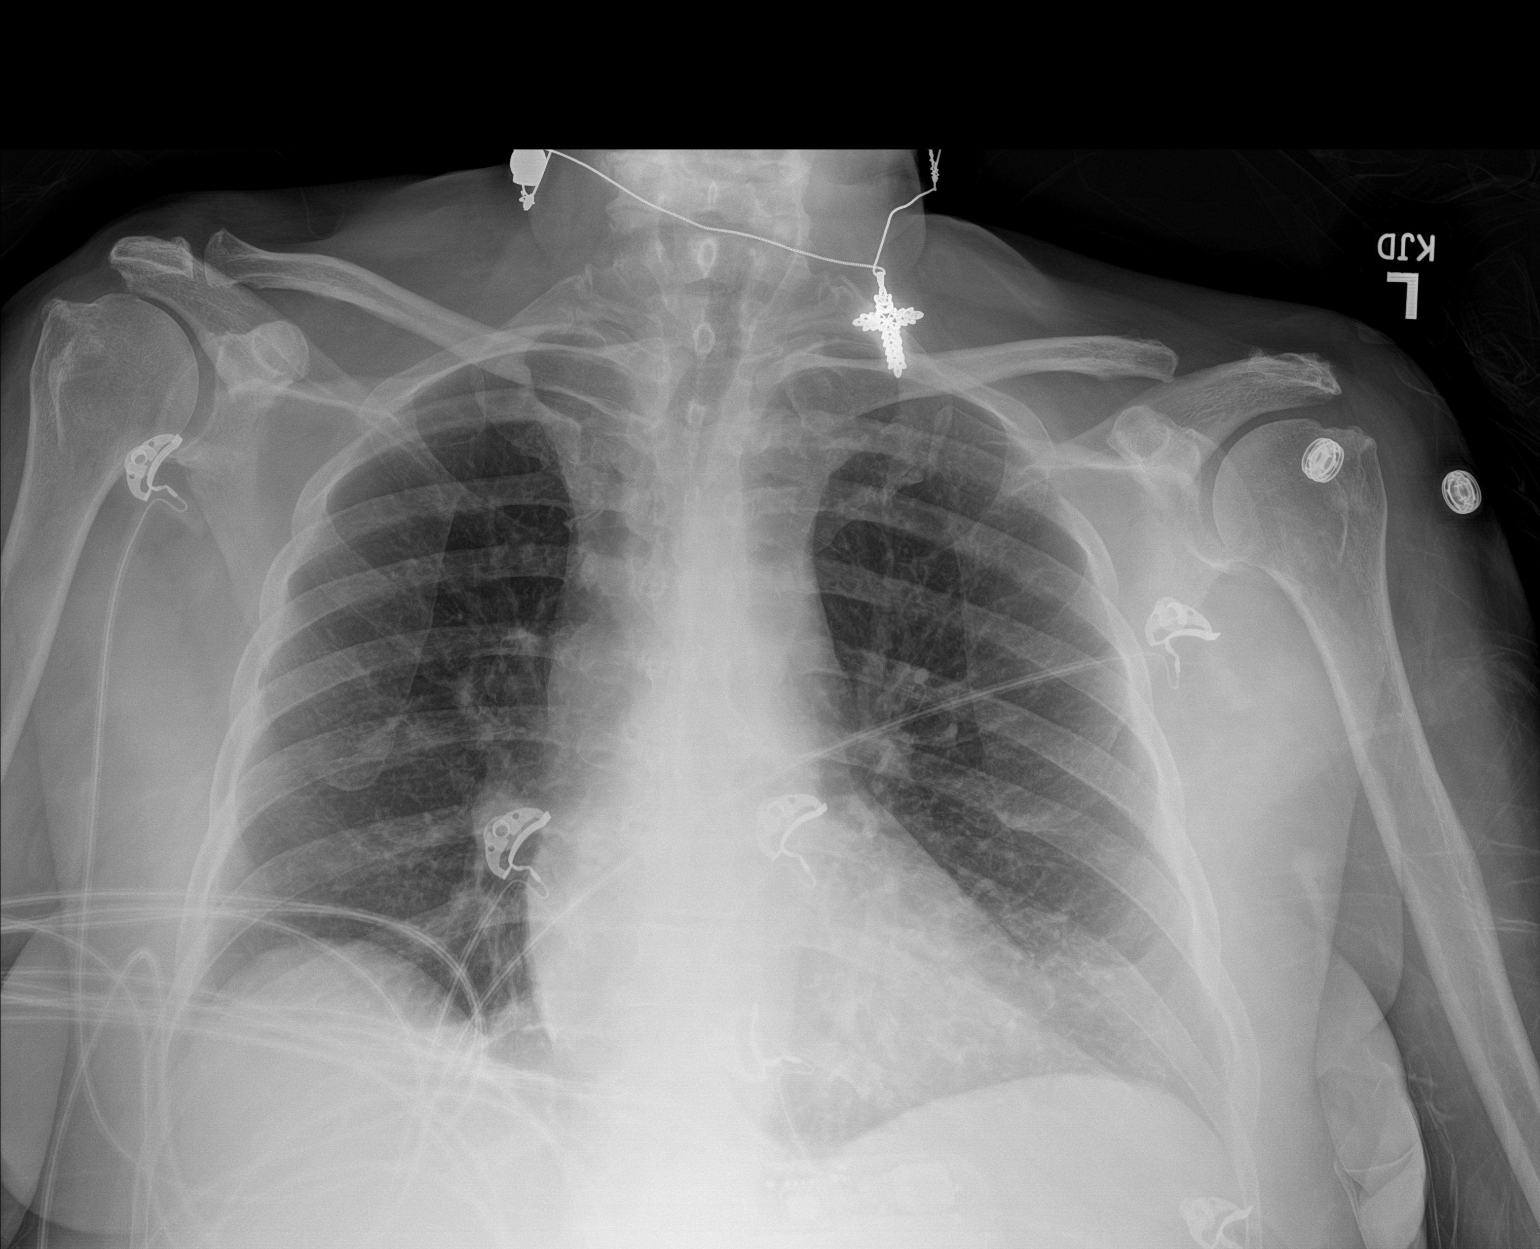

[1 of 1 positions shown; findings below may reference images not displayed]

FINDINGS: The heart size and mediastinal contours are within normal limits.
Both lungs are clear. The visualized skeletal structures are
unremarkable.
IMPRESSION: No active disease.

## 2020-05-19 LAB — CUP PACEART REMOTE DEVICE CHECK
Date Time Interrogation Session: 20211127231245
Implantable Pulse Generator Implant Date: 20210512

## 2020-05-21 ENCOUNTER — Ambulatory Visit (INDEPENDENT_AMBULATORY_CARE_PROVIDER_SITE_OTHER): Payer: Medicare Other

## 2020-05-21 DIAGNOSIS — I639 Cerebral infarction, unspecified: Secondary | ICD-10-CM

## 2020-05-31 NOTE — Progress Notes (Signed)
Carelink Summary Report / Loop Recorder 

## 2020-06-25 ENCOUNTER — Ambulatory Visit (INDEPENDENT_AMBULATORY_CARE_PROVIDER_SITE_OTHER): Payer: Medicare Other

## 2020-06-25 DIAGNOSIS — I639 Cerebral infarction, unspecified: Secondary | ICD-10-CM | POA: Diagnosis not present

## 2020-06-25 LAB — CUP PACEART REMOTE DEVICE CHECK
Date Time Interrogation Session: 20220108233627
Implantable Pulse Generator Implant Date: 20210512

## 2020-07-09 NOTE — Progress Notes (Signed)
Carelink Summary Report / Loop Recorder 

## 2020-07-16 ENCOUNTER — Other Ambulatory Visit: Payer: Self-pay | Admitting: Family

## 2020-07-16 DIAGNOSIS — M25512 Pain in left shoulder: Secondary | ICD-10-CM

## 2020-07-16 DIAGNOSIS — G8929 Other chronic pain: Secondary | ICD-10-CM

## 2020-07-28 LAB — CUP PACEART REMOTE DEVICE CHECK
Date Time Interrogation Session: 20220210234750
Implantable Pulse Generator Implant Date: 20210512

## 2020-07-30 ENCOUNTER — Ambulatory Visit (INDEPENDENT_AMBULATORY_CARE_PROVIDER_SITE_OTHER): Payer: 59

## 2020-07-30 DIAGNOSIS — I639 Cerebral infarction, unspecified: Secondary | ICD-10-CM | POA: Diagnosis not present

## 2020-08-06 ENCOUNTER — Other Ambulatory Visit: Payer: Self-pay

## 2020-08-06 ENCOUNTER — Ambulatory Visit
Admission: RE | Admit: 2020-08-06 | Discharge: 2020-08-06 | Disposition: A | Discharge status: Home / Self Care | Payer: Medicare Other | Source: Ambulatory Visit | Attending: Family | Admitting: Family

## 2020-08-06 DIAGNOSIS — M25512 Pain in left shoulder: Secondary | ICD-10-CM

## 2020-08-06 DIAGNOSIS — G8929 Other chronic pain: Secondary | ICD-10-CM

## 2020-08-06 NOTE — Progress Notes (Signed)
Carelink Summary Report / Loop Recorder 

## 2020-08-30 LAB — CUP PACEART REMOTE DEVICE CHECK
Date Time Interrogation Session: 20220316005225
Implantable Pulse Generator Implant Date: 20210512

## 2020-09-03 ENCOUNTER — Ambulatory Visit (INDEPENDENT_AMBULATORY_CARE_PROVIDER_SITE_OTHER): Payer: 59

## 2020-09-03 DIAGNOSIS — I639 Cerebral infarction, unspecified: Secondary | ICD-10-CM

## 2020-09-11 NOTE — Progress Notes (Signed)
Carelink Summary Report / Loop Recorder 

## 2020-09-14 ENCOUNTER — Other Ambulatory Visit: Payer: Self-pay | Admitting: Surgical

## 2020-09-17 ENCOUNTER — Encounter: Payer: Self-pay | Admitting: Neurology

## 2020-09-17 ENCOUNTER — Ambulatory Visit (INDEPENDENT_AMBULATORY_CARE_PROVIDER_SITE_OTHER): Payer: 59 | Admitting: Neurology

## 2020-09-17 VITALS — BP 136/73 | HR 80 | Ht 64.0 in | Wt 161.5 lb

## 2020-09-17 DIAGNOSIS — R413 Other amnesia: Secondary | ICD-10-CM | POA: Diagnosis not present

## 2020-09-17 DIAGNOSIS — Z8673 Personal history of transient ischemic attack (TIA), and cerebral infarction without residual deficits: Secondary | ICD-10-CM

## 2020-09-17 NOTE — Progress Notes (Signed)
[facility] [address] [place]. [place] [address] [phone]       OFFICE CONSULT NOTE  [name] Date of Birth:  Sep 06, 1950 Medical Record Number:  [id number]   Referring MD: Dustin Folks Reason for Referral: Memory loss HPI: [name] is a 70 year old pleasant African-American lady seen today for initial office consultation visit for memory loss.  History is obtained from the patient and her granddaughter [name] who is accompanying her.  I personally reviewed referral notes, electronic medical records and pertinent imaging films in PACS.  She has past medical history of diabetes, hypertension, hyperlipidemia, sleep apnea and arthritis.  She has been having memory difficulties for over a year.  She forgets her appointments as well as her medications.  She often gets disoriented.  She has good days and bad days.  She lives alone but her granddaughter keeps a close eye on her and helps her.  She is off later needed more help.  She has not been driving.  She can cook somewhat but grandchildren have been helping.  She needs supervision for a bath.  She has not had any hallucinations, delusions, violence or unsafe behavior.  She does get irritable and upset easily.  She denies any headaches, gait difficulties, double vision, focal extremity weakness numbness.  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  Loop recorder insertion has not shown paroxysmal A. fib yet.  She remains on aspirin for stroke prevention which is tolerating well without bruising or bleeding.  Her blood pressures well controlled today it is 136/73.  She cannot tell me when last lipid profile was checked or what her last hemoglobin A1c was.  There is no family Struve Alzheimer's or dementia.  ROS:   14 system review of systems is positive for memory loss, forgetfulness,  disorientation and all other systems negative PMH:  Past Medical History:  Diagnosis Date  . Arthritis   . Diabetes mellitus without complication ([place])   . GERD (gastroesophageal reflux disease)   . H/O hiatal hernia   . Headache(784.0)   . Hyperlipidemia   . Hypertension   . Neuromuscular disorder (HCC)    neuropathy feet   . Sleep apnea    recently diagnosed- wears cpap  . Stroke [facility]) 06/2019    Social History:  Social History   Socioeconomic History  . Marital status: Divorced    Spouse name: Not on file  . Number of children: 3  . Years of education: Not on file  . Highest education level: Not on file  Occupational History  . Occupation: retired    Fish farm manager: [employer]  Tobacco Use  . Smoking status: Never Smoker  . Smokeless tobacco: Never Used  Vaping Use  . Vaping Use: Never used  Substance and Sexual Activity  . Alcohol use: Yes    Alcohol/week: 0.0 standard drinks    Comment: rarely  . Drug use: No  . Sexual activity: Not Currently  Other Topics Concern  . Not on file  Social History Narrative   Lives alone   Right Handed   Drinks caffeine very seldomly   Social Determinants of Health   Financial Resource Strain: Not on file  Food Insecurity: Not on file  Transportation Needs: Not on file  Physical Activity: Not on file  Stress: Not on file  Social Connections: Not on file  Intimate  Partner Violence: Not on file    Medications:   Current Outpatient Medications on File Prior to Visit  Medication Sig Dispense Refill  . ACCU-CHEK AVIVA PLUS test strip USE BID FOR GLUCOSE TESTING  11  . amLODipine (NORVASC) 10 MG tablet Take 10 mg by mouth daily.    Marland Kitchen aspirin 81 MG chewable tablet Chew 1 tablet (81 mg total) by mouth daily. 30 tablet 0  . atorvastatin (LIPITOR) 20 MG tablet Take 20 mg by mouth daily.    . cyclobenzaprine (FLEXERIL) 5 MG tablet Take 5 mg by mouth at bedtime.     . diclofenac sodium (VOLTAREN) 1 % GEL Apply 2 g topically daily  as needed (pain).     . feeding supplement, GLUCERNA SHAKE, (GLUCERNA SHAKE) LIQD Take 237 mLs by mouth 3 (three) times daily between meals. 21330 mL 1  . gabapentin (NEURONTIN) 100 MG capsule TAKE 1 CAPSULE(100 MG) BY MOUTH TWICE DAILY 60 capsule 0  . glimepiride (AMARYL) 4 MG tablet Take 4 mg by mouth daily with breakfast.     . losartan (COZAAR) 25 MG tablet Take 1 tablet (25 mg total) by mouth daily. 30 tablet 1  . omeprazole (PRILOSEC) 20 MG capsule Take 20 mg by mouth daily.     . SYMBICORT 80-4.5 MCG/ACT inhaler Inhale 2 puffs into the lungs 2 (two) times daily.    Marland Kitchen tiZANidine (ZANAFLEX) 4 MG tablet Take 4 mg by mouth 2 (two) times daily.    Marland Kitchen topiramate (TOPAMAX) 100 MG tablet Take 100 mg by mouth daily.    . TRULICITY 6.29 UT/6.5YY SOPN Take 0.75 mg by mouth every Monday.    . VENTOLIN HFA 108 (90 Base) MCG/ACT inhaler Inhale 2 puffs into the lungs every 4 (four) hours as needed for wheezing or shortness of breath.   5  . allopurinol (ZYLOPRIM) 100 MG tablet Take 1 tablet (100 mg total) by mouth daily. (Patient not taking: Reported on 09/18/2019) 30 tablet 0  . ferrous sulfate 325 (65 FE) MG tablet Take 1 tablet (325 mg total) by mouth daily with breakfast. 30 tablet 0  . metFORMIN (GLUCOPHAGE) 1000 MG tablet Take 1,000 mg by mouth 2 (two) times daily.  0   No current facility-administered medications on file prior to visit.    Allergies:  No Known Allergies  Physical Exam General: Pleasant middle-aged African-American lady, seated, in no evident distress Head: head normocephalic and atraumatic.   Neck: supple with no carotid or supraclavicular bruits Cardiovascular: regular rate and rhythm, no murmurs Musculoskeletal: no deformity Skin:  no rash/petichiae Vascular:  Normal pulses all extremities  Neurologic Exam Mental Status: Awake and fully alert. Oriented to place and time. Recent and remote memory diminished. Attention span, concentration and fund of knowledge appropriate.  Mood and affect appropriate.  Mini-Mental status exam score 26/30.  Diminished recall 1/3.  Able to name only 7 animals which can walk on 4 legs.  Clock drawing 2/4.  Slight difficulty with copying intersecting pentagons. Cranial Nerves: Fundoscopic exam reveals sharp disc margins. Pupils equal, briskly reactive to light. Extraocular movements full without nystagmus. Visual fields full to confrontation. Hearing intact. Facial sensation intact. Face, tongue, palate moves normally and symmetrically.  Motor: Normal bulk and tone. Normal strength in all tested extremity muscles. Sensory.: intact to touch , pinprick , position and vibratory sensation.  Coordination: Rapid alternating movements normal in all extremities. Finger-to-nose and heel-to-shin performed accurately bilaterally. Gait and Station: Arises from chair without difficulty. Stance is normal. Gait  demonstrates normal stride length and balance . Able to heel, toe and tandem walk without difficulty.  Reflexes: 1+ and symmetric. Toes downgoing.       ASSESSMENT: 70 year old lady with subacute progressive memory and cognitive worsening over the last 1 year likely due to mild cognitive impairment.  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  Vascular risk factors of diabetes, hyperlipidemia, hypertension and obesity.     PLAN: I had a long discussion with the patient and her granddaughter regarding her memory loss or cognitive impairment and discussed plan for evaluation and treatment and answered questions.  Recommend check memory panel labs, EEG, MRI scan of the brain with and without contrast, hemoglobin A1c and lipid profile.  She was encouraged to increase participation in cognitively challenging activities like solving crossword puzzles, playing bridge and sodoku and we also discussed memory compensation strategies.  Continue aspirin for stroke prevention and maintain aggressive risk factor  modification with strict control of hypertension with blood pressure goal below 130/90, lipids with LDL cholesterol goal below 70 mg percent and diabetes with hemoglobin A1c goal below 6.5%.  She will return for follow-up in the future in 2 months or call earlier if necessary.  Greater than 50% time during this 45-minute consultation visit was spent in counseling and coordination of care about her memory loss and mild cognitive impairment and answering questions. Antony Contras, MD Note: This document was prepared with digital dictation and possible smart phrase technology. Any transcriptional errors that result from this process are unintentional.

## 2020-09-17 NOTE — Patient Instructions (Addendum)
I had a long discussion with the patient and her granddaughter regarding her memory loss or cognitive impairment and discussed plan for evaluation and treatment and answered questions.  Recommend check memory panel labs, EEG, MRI scan of the brain with and without contrast, hemoglobin A1c and lipid profile.  She was encouraged to increase participation in cognitively challenging activities like solving crossword puzzles, playing bridge and sodoku and we also discussed memory compensation strategies.  Continue aspirin for stroke prevention and maintain aggressive risk factor modification with strict control of hypertension with blood pressure goal below 130/90, lipids with LDL cholesterol goal below 70 mg percent and diabetes with hemoglobin A1c goal below 6.5%.  She will return for follow-up in the future in 2 months or call earlier if necessary.

## 2020-09-18 ENCOUNTER — Telehealth: Payer: Self-pay | Admitting: Neurology

## 2020-09-18 LAB — LIPID PANEL
Chol/HDL Ratio: 2.1 ratio (ref 0.0–4.4)
Cholesterol, Total: 160 mg/dL (ref 100–199)
HDL: 77 mg/dL (ref >39)
LDL Chol Calc (NIH): 58 mg/dL (ref 0–99)
Triglycerides: 154 mg/dL — ABNORMAL HIGH (ref 0–149)
VLDL Cholesterol Cal: 25 mg/dL (ref 5–40)

## 2020-09-18 LAB — DEMENTIA PANEL
Homocysteine: 20.9 umol/L — ABNORMAL HIGH (ref 0.0–17.2)
RPR Ser Ql: NONREACTIVE
TSH: 1.27 u[IU]/mL (ref 0.450–4.500)
Vitamin B-12: 408 pg/mL (ref 232–1245)

## 2020-09-18 LAB — HEMOGLOBIN A1C
Est. average glucose Bld gHb Est-mCnc: 151 mg/dL
Hgb A1c MFr Bld: 6.9 % — ABNORMAL HIGH (ref 4.8–5.6)

## 2020-09-18 NOTE — Telephone Encounter (Signed)
[employer] medicare/medicaid order sent to [facility]. No auth they will reach out to the patient to schedule.  °

## 2020-09-19 ENCOUNTER — Telehealth: Payer: Self-pay | Admitting: *Deleted

## 2020-09-19 NOTE — Progress Notes (Signed)
Kindly advise the patient that lab work for reversible causes of memory loss suggested slight elevation in homocysteine level in the blood.  In case she is smoking she needs to quit smoking.  She needs to take folic acid 1 mg daily and recheck this lab in 2 months and it should come down.  Cholesterol profile was quite satisfactory.  Screening test for diabetes was borderline but acceptable and improved from a year ago.

## 2020-09-19 NOTE — Telephone Encounter (Signed)
Called daughter Blima Rich on [place],  Advised her the patient' s lab work for reversible causes of memory loss suggested slight elevation in homocysteine level in the blood. In case she is smoking she needs to quit smoking. She needs to take folic acid 1 mg daily and recheck this lab in 2 months and it should come down. Cholesterol profile was quite satisfactory. Screening test for diabetes was borderline but acceptable and improved from a year ago. She never smoked, advised she can buy folic acid OTC.  Daughter  verbalized understanding, appreciation.

## 2020-09-21 NOTE — Telephone Encounter (Signed)
Recommend discuss with PCP if she would like to continue Gabapentin. We haven't seen her in almost one year

## 2020-09-29 ENCOUNTER — Other Ambulatory Visit: Payer: Self-pay

## 2020-09-29 ENCOUNTER — Ambulatory Visit
Admission: RE | Admit: 2020-09-29 | Discharge: 2020-09-29 | Disposition: A | Discharge status: Home / Self Care | Payer: 59 | Source: Ambulatory Visit | Attending: Neurology | Admitting: Neurology

## 2020-09-29 DIAGNOSIS — R413 Other amnesia: Secondary | ICD-10-CM

## 2020-09-29 MED ORDER — GADOBENATE DIMEGLUMINE 529 MG/ML IV SOLN
15.0000 mL | Freq: Once | INTRAVENOUS | Status: AC | PRN
  Administered 2020-09-29: 15 mL via INTRAVENOUS

## 2020-10-01 ENCOUNTER — Ambulatory Visit (INDEPENDENT_AMBULATORY_CARE_PROVIDER_SITE_OTHER): Payer: 59

## 2020-10-01 ENCOUNTER — Other Ambulatory Visit: Payer: 59

## 2020-10-01 DIAGNOSIS — I639 Cerebral infarction, unspecified: Secondary | ICD-10-CM | POA: Diagnosis not present

## 2020-10-03 LAB — CUP PACEART REMOTE DEVICE CHECK
Date Time Interrogation Session: 20220418010924
Implantable Pulse Generator Implant Date: 20210512

## 2020-10-08 ENCOUNTER — Other Ambulatory Visit: Payer: Self-pay | Admitting: Surgical

## 2020-10-10 ENCOUNTER — Ambulatory Visit (INDEPENDENT_AMBULATORY_CARE_PROVIDER_SITE_OTHER): Payer: 59 | Admitting: Neurology

## 2020-10-10 DIAGNOSIS — R413 Other amnesia: Secondary | ICD-10-CM

## 2020-10-10 DIAGNOSIS — R410 Disorientation, unspecified: Secondary | ICD-10-CM | POA: Diagnosis not present

## 2020-10-11 ENCOUNTER — Ambulatory Visit: Payer: Medicare Other | Admitting: Adult Health

## 2020-10-17 NOTE — Progress Notes (Signed)
Kindly inform the patient that MR scan of the brain shows evidence of mild hardening of the arteries and shrinkage of the brain.  No definite new stroke or any worrisome finding.

## 2020-10-18 ENCOUNTER — Telehealth: Payer: Self-pay | Admitting: Neurology

## 2020-10-18 NOTE — Progress Notes (Signed)
Carelink Summary Report / Loop Recorder 

## 2020-10-18 NOTE — Telephone Encounter (Signed)
-----   Message from Garvin Fila, MD sent at 10/17/2020  7:04 AM EDT ----- Mitchell Heir inform the patient that MR scan of the brain shows evidence of mild hardening of the arteries and shrinkage of the brain.  No definite new stroke or any worrisome finding.

## 2020-10-18 NOTE — Telephone Encounter (Signed)
Pt called, inquiring about my EEG results. Would like a call from the nurse.

## 2020-10-18 NOTE — Telephone Encounter (Signed)
Called patient and discussed Dr. Clydene Fake review and findings regarding MRI.    Patient denied further questions, verbalized understanding and expressed appreciation for the phone call.

## 2020-10-19 NOTE — Progress Notes (Signed)
Kindly inform the patient that brainwave study showed mild slowing of brain activity which may be seen in patients with memory loss.  No definite seizure activity was noted.

## 2020-10-23 ENCOUNTER — Telehealth: Payer: Self-pay | Admitting: Emergency Medicine

## 2020-10-23 NOTE — Telephone Encounter (Signed)
-----   Message from Garvin Fila, MD sent at 10/19/2020  9:52 AM EDT ----- Mitchell Heir inform the patient that brainwave study showed mild slowing of brain activity which may be seen in patients with memory loss.  No definite seizure activity was noted.

## 2020-10-23 NOTE — Telephone Encounter (Signed)
Called patient and discussed Dr. Clydene Fake review and findings for EEG study.  Patient denied further questions, verbalized understanding and expressed appreciation for the phone call.

## 2020-11-01 ENCOUNTER — Encounter (INDEPENDENT_AMBULATORY_CARE_PROVIDER_SITE_OTHER): Payer: 59 | Admitting: Ophthalmology

## 2020-11-05 ENCOUNTER — Ambulatory Visit (INDEPENDENT_AMBULATORY_CARE_PROVIDER_SITE_OTHER): Payer: 59

## 2020-11-05 DIAGNOSIS — I639 Cerebral infarction, unspecified: Secondary | ICD-10-CM

## 2020-11-06 LAB — CUP PACEART REMOTE DEVICE CHECK
Date Time Interrogation Session: 20220521010741
Implantable Pulse Generator Implant Date: 20210512

## 2020-11-13 ENCOUNTER — Ambulatory Visit: Payer: Self-pay | Admitting: Adult Health

## 2020-11-15 ENCOUNTER — Ambulatory Visit (INDEPENDENT_AMBULATORY_CARE_PROVIDER_SITE_OTHER): Payer: 59 | Admitting: Ophthalmology

## 2020-11-15 ENCOUNTER — Encounter (INDEPENDENT_AMBULATORY_CARE_PROVIDER_SITE_OTHER): Payer: Self-pay | Admitting: Ophthalmology

## 2020-11-15 ENCOUNTER — Other Ambulatory Visit: Payer: Self-pay

## 2020-11-15 DIAGNOSIS — H25043 Posterior subcapsular polar age-related cataract, bilateral: Secondary | ICD-10-CM | POA: Diagnosis not present

## 2020-11-15 DIAGNOSIS — E1121 Type 2 diabetes mellitus with diabetic nephropathy: Secondary | ICD-10-CM

## 2020-11-15 DIAGNOSIS — H43813 Vitreous degeneration, bilateral: Secondary | ICD-10-CM | POA: Insufficient documentation

## 2020-11-15 DIAGNOSIS — H2513 Age-related nuclear cataract, bilateral: Secondary | ICD-10-CM

## 2020-11-15 NOTE — Assessment & Plan Note (Signed)
The patient has diabetes without any evidence of retinopathy. The patient advised to maintain good blood glucose control, excellent blood pressure control, and favorable levels of cholesterol, low density lipoprotein, and high density lipoproteins. Follow up in 1 year was recommended. Explained that fluctuations in visual acuity , or "out of focus", may result from large variations of blood sugar control.   No detectable diabetic retinopathy OU

## 2020-11-15 NOTE — Assessment & Plan Note (Signed)
With concomitant PSC cataract, needs cataract evaluation and consideration

## 2020-11-15 NOTE — Assessment & Plan Note (Signed)
Minor and seen on OCT alone.

## 2020-11-15 NOTE — Progress Notes (Signed)
11/15/2020     CHIEF COMPLAINT Patient presents for Retina Follow Up (1 Year overdue F/U OU//Pt c/o blurry VA OU upon waking x "a while." Pt sts VA OU eventually clears up. No ocular pain, flashes, or floaters OU./A1c: unknown to pt/LBS: 89 this AM)   HISTORY OF PRESENT ILLNESS: [name] is a 70 y.o. female who presents to the clinic today for:   HPI    Retina Follow Up    Diagnosis: Other   Laterality: both eyes   Onset: 1 year ago   Severity: moderate   Duration: 1 year   Course: gradually worsening   Comments: 1 Year overdue F/U OU  Pt c/o blurry VA OU upon waking x "a while." Pt sts VA OU eventually clears up. No ocular pain, flashes, or floaters OU. A1c: unknown to pt LBS: 89 this AM       Last edited by Rockie Neighbours, [place] on 11/15/2020  2:13 PM. (History)      Referring physician: [name] [place],  [place] [id number]  HISTORICAL INFORMATION:   Selected notes from the MEDICAL RECORD NUMBER    Lab Results  Component Value Date   HGBA1C 6.9 (H) 09/17/2020     CURRENT MEDICATIONS: No current outpatient medications on file. (Ophthalmic Drugs)   No current facility-administered medications for this visit. (Ophthalmic Drugs)   Current Outpatient Medications (Other)  Medication Sig  . ACCU-CHEK AVIVA PLUS test strip USE BID FOR GLUCOSE TESTING  . allopurinol (ZYLOPRIM) 100 MG tablet Take 1 tablet (100 mg total) by mouth daily. (Patient not taking: Reported on 09/18/2019)  . amLODipine (NORVASC) 10 MG tablet Take 10 mg by mouth daily.  Marland Kitchen aspirin 81 MG chewable tablet Chew 1 tablet (81 mg total) by mouth daily.  Marland Kitchen atorvastatin (LIPITOR) 20 MG tablet Take 20 mg by mouth daily.  . cyclobenzaprine (FLEXERIL) 5 MG tablet Take 5 mg by mouth at bedtime.   . diclofenac sodium (VOLTAREN) 1 % GEL Apply 2 g topically daily as needed (pain).   . feeding supplement, GLUCERNA SHAKE, (GLUCERNA SHAKE) LIQD Take 237 mLs by mouth 3 (three)  times daily between meals.  . ferrous sulfate 325 (65 FE) MG tablet Take 1 tablet (325 mg total) by mouth daily with breakfast.  . gabapentin (NEURONTIN) 100 MG capsule TAKE 1 CAPSULE(100 MG) BY MOUTH TWICE DAILY  . glimepiride (AMARYL) 4 MG tablet Take 4 mg by mouth daily with breakfast.   . losartan (COZAAR) 25 MG tablet Take 1 tablet (25 mg total) by mouth daily.  . metFORMIN (GLUCOPHAGE) 1000 MG tablet Take 1,000 mg by mouth 2 (two) times daily.  Marland Kitchen omeprazole (PRILOSEC) 20 MG capsule Take 20 mg by mouth daily.   . SYMBICORT 80-4.5 MCG/ACT inhaler Inhale 2 puffs into the lungs 2 (two) times daily.  Marland Kitchen tiZANidine (ZANAFLEX) 4 MG tablet Take 4 mg by mouth 2 (two) times daily.  Marland Kitchen topiramate (TOPAMAX) 100 MG tablet Take 100 mg by mouth daily.  . TRULICITY 7.62 GB/1.5VV SOPN Take 0.75 mg by mouth every Monday.  . VENTOLIN HFA 108 (90 Base) MCG/ACT inhaler Inhale 2 puffs into the lungs every 4 (four) hours as needed for wheezing or shortness of breath.    No current facility-administered medications for this visit. (Other)      REVIEW OF SYSTEMS:    ALLERGIES No Known Allergies  PAST MEDICAL HISTORY Past Medical History:  Diagnosis Date  . Arthritis   .  Diabetes mellitus without complication ([place])   . GERD (gastroesophageal reflux disease)   . H/O hiatal hernia   . Headache(784.0)   . Hyperlipidemia   . Hypertension   . Neuromuscular disorder (HCC)    neuropathy feet   . Sleep apnea    recently diagnosed- wears cpap  . Stroke [facility]) 06/2019   Past Surgical History:  Procedure Laterality Date  . ABDOMINAL HYSTERECTOMY     partial  . BACK SURGERY    . BUBBLE STUDY  07/11/2019   Procedure: BUBBLE STUDY;  Surgeon: Dixie Dials, MD;  Location: [place];  Service: Cardiovascular;;  . CHOLECYSTECTOMY    . COLONOSCOPY     age 54- doesnt know where or who   . implantable loop recorder placement  10/26/2019   Medtronic Reveal [place] model [id number]) implantable loop  recorder implanted by Dr Rayann Heman for cryptogenic stroke  . left shoulder     x 2 surgeries  . right shoulder     x2  . SHOULDER OPEN ROTATOR CUFF REPAIR Right 08/25/2012   Procedure: ROTATOR CUFF REPAIR SHOULDER OPEN WITH ACROMIOPLASTY;  Surgeon: Tobi Bastos, MD;  Location: [facility];  Service: Orthopedics;  Laterality: Right;  . TEE WITHOUT CARDIOVERSION N/A 07/11/2019   Procedure: TRANSESOPHAGEAL ECHOCARDIOGRAM (TEE);  Surgeon: Dixie Dials, MD;  Location: [facility];  Service: Cardiovascular;  Laterality: N/A;  . TONSILLECTOMY      FAMILY HISTORY Family History  Problem Relation Age of Onset  . Colon cancer Father   . Colon cancer Paternal Grandfather   . Colon cancer Maternal Grandfather   . Bone cancer Maternal Uncle   . Colon polyps Neg Hx   . Esophageal cancer Neg Hx   . Rectal cancer Neg Hx   . Stomach cancer Neg Hx     SOCIAL HISTORY Social History   Tobacco Use  . Smoking status: Never Smoker  . Smokeless tobacco: Never Used  Vaping Use  . Vaping Use: Never used  Substance Use Topics  . Alcohol use: Yes    Alcohol/week: 0.0 standard drinks    Comment: rarely  . Drug use: No         OPHTHALMIC EXAM: Base Eye Exam    Visual Acuity (ETDRS)      Right Left   Dist cc 20/30 -2 20/25 +2   Dist ph cc 20/25    Correction: Glasses       Tonometry (Tonopen, 2:19 PM)      Right Left   Pressure 14 14       Pupils      Pupils Dark Light Shape React APD   Right PERRL 5 4 Round Sluggish None   Left PERRL 5 4 Round Sluggish None       Visual Fields (Counting fingers)      Left Right    Full Full       Extraocular Movement      Right Left    Full Full       Neuro/Psych    Oriented x3: Yes   Mood/Affect: Normal       Dilation    Both eyes: 1.0% Mydriacyl, 2.5% Phenylephrine @ 2:19 PM        Slit Lamp and Fundus Exam    External Exam      Right Left   External Normal Normal       Slit Lamp Exam      Right Left   Lids/Lashes Normal  Normal  Conjunctiva/Sclera White and quiet White and quiet   Cornea Clear Clear   Anterior Chamber Deep and quiet Deep and quiet   Iris Round and reactive Round and reactive   Lens 2+ Nuclear sclerosis, 2+ Posterior subcapsular cataract 1+ Nuclear sclerosis   Anterior Vitreous Normal Normal       Fundus Exam      Right Left   Posterior Vitreous Normal Normal   Disc Normal Normal   C/D Ratio 0.4 0.35   Macula Normal, no hemorrhage, no macular thickening Normal, no hemorrhage, no macular thickening   Vessels no DR no DR   Periphery Normal Normal          IMAGING AND PROCEDURES  Imaging and Procedures for 11/15/20  OCT, Retina - OU - Both Eyes       Right Eye Quality was good. Central Foveal Thickness: 211. Progression has been stable. Findings include normal foveal contour.   Left Eye Quality was good. Scan locations included subfoveal. Central Foveal Thickness: 208. Progression has been stable. Findings include normal foveal contour.                 ASSESSMENT/PLAN:  Diabetes mellitus with nephropathy ([place]) The patient has diabetes without any evidence of retinopathy. The patient advised to maintain good blood glucose control, excellent blood pressure control, and favorable levels of cholesterol, low density lipoprotein, and high density lipoproteins. Follow up in 1 year was recommended. Explained that fluctuations in visual acuity , or "out of focus", may result from large variations of blood sugar control.   No detectable diabetic retinopathy OU  Cataract, nuclear sclerotic, both eyes With concomitant PSC cataract, needs cataract evaluation and consideration  Posterior subcapsular age-related cataract, both eyes In the visual axis, will need cataract evaluation  Posterior vitreous detachment of both eyes Minor and seen on OCT alone.      ICD-10-CM   1. Posterior vitreous detachment, both eyes  H43.813 OCT, Retina - OU - Both Eyes  2. Diabetes mellitus  with nephropathy (HCC)  E11.21   3. Cataract, nuclear sclerotic, both eyes  H25.13   4. Posterior subcapsular age-related cataract, both eyes  H25.043   5. Posterior vitreous detachment of both eyes  H43.813     1.  No detectable diabetic retinopathy in either eye.  2.  Patient does have centrally located posterior subcapsular cataract which accounts for her visual function and acuity.  I explained to the patient that these are the FAZ type of cataract growing and that she might notice a fall progressively developing over her eyes in the coming months right eye currently worse than the left eye  3.  I suggest cataract evaluation introduction to the concepts and the possible requirements in the coming months and will refer to ophthalmology  Ophthalmic Meds Ordered this visit:  No orders of the defined types were placed in this encounter.      Return in about 1 year (around 11/15/2021) for DILATE OU, COLOR FP.  There are no Patient Instructions on file for this visit.   Explained the diagnoses, plan, and follow up with the patient and they expressed understanding.  Patient expressed understanding of the importance of proper follow up care.   Clent Demark Becky Colan M.D. Diseases & Surgery of the Retina and Vitreous Retina & Diabetic [place] 11/15/20     Abbreviations: M myopia (nearsighted); A astigmatism; H hyperopia (farsighted); P presbyopia; Mrx spectacle prescription;  CTL contact lenses; OD right eye; OS left eye; OU  both eyes  XT exotropia; ET esotropia; PEK punctate epithelial keratitis; PEE punctate epithelial erosions; DES dry eye syndrome; MGD meibomian gland dysfunction; ATs artificial tears; PFAT's preservative free artificial tears; [place] nuclear sclerotic cataract; PSC posterior subcapsular cataract; ERM epi-retinal membrane; PVD posterior vitreous detachment; RD retinal detachment; DM diabetes mellitus; DR diabetic retinopathy; NPDR non-proliferative diabetic retinopathy; PDR  proliferative diabetic retinopathy; CSME clinically significant macular edema; DME diabetic macular edema; dbh dot blot hemorrhages; CWS cotton wool spot; POAG primary open angle glaucoma; C/D cup-to-disc ratio; HVF humphrey visual field; GVF goldmann visual field; OCT optical coherence tomography; IOP intraocular pressure; BRVO Branch retinal vein occlusion; CRVO central retinal vein occlusion; CRAO central retinal artery occlusion; BRAO branch retinal artery occlusion; RT retinal tear; SB scleral buckle; PPV pars plana vitrectomy; VH Vitreous hemorrhage; PRP panretinal laser photocoagulation; IVK intravitreal kenalog; VMT vitreomacular traction; MH Macular hole;  NVD neovascularization of the disc; NVE neovascularization elsewhere; AREDS age related eye disease study; ARMD age related macular degeneration; POAG primary open angle glaucoma; EBMD epithelial/anterior basement membrane dystrophy; ACIOL anterior chamber intraocular lens; IOL intraocular lens; PCIOL posterior chamber intraocular lens; Phaco/IOL phacoemulsification with intraocular lens placement; [place] photorefractive keratectomy; LASIK laser assisted in situ keratomileusis; HTN hypertension; DM diabetes mellitus; COPD chronic obstructive pulmonary disease

## 2020-11-15 NOTE — Assessment & Plan Note (Signed)
In the visual axis, will need cataract evaluation

## 2020-11-27 NOTE — Progress Notes (Signed)
Carelink Summary Report / Loop Recorder 

## 2020-11-29 ENCOUNTER — Ambulatory Visit (INDEPENDENT_AMBULATORY_CARE_PROVIDER_SITE_OTHER): Payer: 59 | Admitting: Neurology

## 2020-11-29 ENCOUNTER — Other Ambulatory Visit: Payer: Self-pay

## 2020-11-29 ENCOUNTER — Encounter: Payer: Self-pay | Admitting: Neurology

## 2020-11-29 VITALS — BP 114/72 | HR 94 | Ht 64.0 in | Wt 164.4 lb

## 2020-11-29 DIAGNOSIS — R413 Other amnesia: Secondary | ICD-10-CM | POA: Diagnosis not present

## 2020-11-29 DIAGNOSIS — F0280 Dementia in other diseases classified elsewhere without behavioral disturbance: Secondary | ICD-10-CM | POA: Diagnosis not present

## 2020-11-29 DIAGNOSIS — G300 Alzheimer's disease with early onset: Secondary | ICD-10-CM

## 2020-11-29 MED ORDER — FOLIC ACID 1 MG PO TABS: 1.0000 mg | ORAL_TABLET | Freq: Every day | ORAL | 1 refills | Status: AC

## 2020-11-29 MED ORDER — DONEPEZIL HCL 10 MG PO TABS: 10.0000 mg | ORAL_TABLET | Freq: Every day | ORAL | 3 refills | Status: DC

## 2020-11-29 NOTE — Patient Instructions (Signed)
I had a long discussion with the patient regarding the results of her lab work and EEG and answered questions.  I recommend she start folic acid 1 mg daily to help with her elevated homocystine and not smoke.  Also start Aricept 5 mg at night for 4 weeks and increase if tolerated without side effects to 10 mg daily.  She will stay on aspirin for stroke prevention and maintain aggressive risk factor modification with strict control of hypertension with blood pressure goal below 130/90, diabetes with hemoglobin A1c goal below 6.5% and lipids with LDL cholesterol goal below 70 mg percent.  She will return for follow-up in the future in 2 months or call earlier if necessary. Alzheimer's Disease Alzheimer's disease is a brain disease that affects memory, thinking, language, and behavior. People with Alzheimer's disease lose mental abilities, and [name] gets worse over time. Alzheimer's disease is a form of dementia. What are the causes? This condition develops when a protein called beta-amyloid forms deposits inthe brain. It is not known what causes these deposits to form. Alzheimer's disease may also be caused by a gene mutation that is inherited from one parent or both parents. A gene mutation is a harmful change in a gene.Not everyone who inherits the genetic mutation will get the disease. What increases the risk? You are more likely to develop this condition if you: Are older than age 66. Are female. Have any of these medical conditions: High blood pressure. Diabetes. Heart or blood vessel disease. Smoke. Have obesity. Have had a brain injury. Have had a stroke. Have a family history of dementia. What are the signs or symptoms? Symptoms of this condition may happen in three stages, which often overlap. Early stage In this stage, you may continue to be independent. You may still be able to drive, work, and be social. Symptoms in this stage include: Minor memory problems, such as forgetting a  name, words, or what you did recently. Difficulty with: Paying attention. Communicating. Doing familiar tasks. Problem solving or doing calculations. Following instructions. Learning new things. Anxiety. Social withdrawal. Loss of motivation. Moderate stage In this stage, you will start to need care. Symptoms in this stage include: Difficulty with expressing thoughts. Memory loss that affects daily life. This can include forgetting: Recent events that have happened. If you have taken medicines or eaten. Familiar places. You may get lost while walking or driving. To pay bills or manage finances. Personal hygiene such as bathing or using the bathroom. Confusion about where you are or what time it is. Difficulty in judging distance. Changes in personality, mood, and behavior. You may be moody, irritable, angry, frustrated, fearful, anxious, or suspicious. Poor reasoning and judgment. Delusions or hallucinations. Changes in sleep patterns. Severe stage In the final stage, you will need help with your personal care and daily activities. Symptoms in this stage include: Worsening memory loss. Personality changes. Loss of awareness of your surroundings. Changes in physical abilities, including the ability to walk, sit, and swallow. Difficulty in communicating. Inability to control your bladder and bowels. Increasing confusion. Increasing behavior changes. How is this diagnosed?  This condition is diagnosed by a health care provider who specializes in diseases of the nervous system (neurologist) or one who specializes in care of the elderly (geriatrician or geriatric psychiatrist). Other causes of dementia may also be ruled out. Your health care provider will talk with you and your family, friends, or caregivers about your historyand symptoms. A thorough medical history will be taken, and you  will have a physical exam and tests. Tests may include: Lab tests, such as blood or urine  tests. Imaging tests, such as a CT scan, a PET scan, or an MRI. A lumbar puncture. This test involves removing and testing a small amount of the fluid that surrounds the brain and spinal cord. An electroencephalogram (EEG). In this test, small metal discs are used to measure electrical activity in the brain. Memory tests, cognitive tests, and neuropsychological tests. These tests evaluate brain function. Genetic testing. This may be done if you have early onset of the disease (before age 69) or if other family members have the disease. How is this treated? At this time, there is no treatment to cure Alzheimer's disease or stop it from getting worse. The goals of treatment are: To manage behavioral changes. To provide you with a safe environment. To help manage daily life for you and your caregivers. The following treatment options are available: Medicines. Medicines may help the memory work better and manage behavioral symptoms. Cognitive therapy. Cognitive therapy provides you with education, support, and memory aids. It is most helpful in the early stages of the condition. Counseling or spiritual guidance. It is normal to have a lot of feelings, including anger, relief, fear, and isolation. Counseling and guidance can help you deal with these feelings. Caregiving. This involves having caregivers help you with your daily activities. Family support groups. These provide education, emotional support, and information about community resources to family members who are taking care of you. Follow these instructions at home:  Medicines Take over-the-counter and prescription medicines only as told by your health care provider. Use a pill organizer or pill reminder to help you manage your medicines. Avoid taking medicines that can affect thinking, such as pain medicines or sleeping medicines. Lifestyle Make healthy lifestyle choices: Be physically active as told by your health care provider.  Regular exercise may help improve symptoms. Do not use any products that contain nicotine or tobacco, such as cigarettes, e-cigarettes, and chewing tobacco. If you need help quitting, ask your health care provider. Do not drink alcohol. Eat a healthy diet. Practice stress-management techniques when you get stressed. Stay social. Drink enough fluid to keep your urine pale yellow. Make sure to get quality sleep. Avoid taking long naps during the day. Take short naps of 30 minutes or less if needed. Keep your sleeping area dark and cool. Avoid exercising during the few hours before you go to bed. Avoid caffeine products in the afternoon and evening. General instructions Work with your health care provider to determine what you need help with and what your safety needs are. If you were given a bracelet that identifies you as a person with memory loss or tracks your location, make sure to wear it at all times. Talk with your health care provider about whether it is safe for you to drive. Work with your family to make important decisions, such as advance directives, medical power of attorney, or a living will. Keep all follow-up visits. This is important. Where to find more information The Alzheimer's Association: Call the 24-hour helpline at 1-430-799-2146, or visit [employer] Contact a health care provider if: You have nausea, vomiting, or trouble with eating related to a medicine. You have worsening mood or behavior changes, such as depression, anxiety, or hallucinations. You or your family members become concerned for your safety. Get help right away if: You become less responsive or are difficult to wake up. Your memory suddenly gets worse. You feel  that you want to harm yourself. If you ever feel like you may hurt yourself or others, or have thoughts about taking your own life, get help right away. Go to your nearest emergency department or: Call your local emergency services (911 in  the [place] Call a suicide crisis helpline, such as the [place] at 737-424-9222. This is open 24 hours a day in the [place] Text the Crisis Text Line at (770)366-6119 (in the [place].). Summary Alzheimer's disease is a brain disease that affects memory, thinking, language, and behavior. Alzheimer's disease is a form of dementia. This condition is diagnosed by a specialist in diseases of the nervous system (neurologist) or one who specializes in care of the elderly. At this time, there is no treatment to cure Alzheimer's disease or stop it from getting worse. The goal of treatment is to help you manage any symptoms. Work with your family to make important decisions, such as advance directives, medical power of attorney, or a living will. This information is not intended to replace advice given to you by your health care provider. Make sure you discuss any questions you have with your healthcare provider. Document Revised: 09/19/2019 Document Reviewed: 09/19/2019 Elsevier Patient Education  2022 [employer].

## 2020-11-29 NOTE — Progress Notes (Signed)
[facility] [address] [place]. [place] [address] [phone]       OFFICE FOLLOW UP VISIT NOTE  [name] Date of Birth:  January 17, 1951 Medical Record Number:  [id number]   Referring MD: Dustin Folks Reason for Referral: Memory loss HPI: Initial visit 09/22/2019:Ms. Platt is a 70 year old pleasant African-American lady seen today for initial office consultation visit for memory loss.  History is obtained from the patient and her granddaughter Danae Chen who is accompanying her.  I personally reviewed referral notes, electronic medical records and pertinent imaging films in PACS.  She has past medical history of diabetes, hypertension, hyperlipidemia, sleep apnea and arthritis.  She has been having memory difficulties for over a year.  She forgets her appointments as well as her medications.  She often gets disoriented.  She has good days and bad days.  She lives alone but her granddaughter keeps a close eye on her and helps her.  She is off later needed more help.  She has not been driving.  She can cook somewhat but grandchildren have been helping.  She needs supervision for a bath.  She has not had any hallucinations, delusions, violence or unsafe behavior.  She does get irritable and upset easily.  She denies any headaches, gait difficulties, double vision, focal extremity weakness numbness.  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  Loop recorder insertion has not shown paroxysmal A. fib yet.  She remains on aspirin for stroke prevention which is tolerating well without bruising or bleeding.  Her blood pressures well controlled today it is 136/73.  She cannot tell me when last lipid profile was checked or what her last hemoglobin A1c was.  There is no family Struve Alzheimer's or dementia. Update 11/29/2020: She returns for follow-up after last  visit 2 months ago.  She continues to have memory difficulties but she states this may be getting better.  She has been perspiring and doing memory challenging activities.  She continues to live alone.  Her grandson helps her and checks on her daily.  She is still able to do most actives of daily living for herself.  She denies any delusions, hallucinations, agitation or unsafe behavior.  She had lab work done at last visit on 09/17/2020 which showed normal vitamin B12, TSH.  RPR was negative.  Homocystine was elevated at 20.9.  I had advised her to start taking folic acid but she has not done that yet.  LDL cholesterol was 58 mg percent and hemoglobin A1c was 6.9.  EEG done on 10/16/2020 showed mild generalized slowing which is nonspecific.  She has not been tried yet on medication like Aricept or Namenda but is willing to try ROS:   14 system review of systems is positive for memory loss, forgetfulness, disorientation and all other systems negative PMH:  Past Medical History:  Diagnosis Date   Arthritis    Diabetes mellitus without complication (HCC)    GERD (gastroesophageal reflux disease)    H/O hiatal hernia    Headache(784.0)    Hyperlipidemia    Hypertension    Neuromuscular disorder ([place])    neuropathy feet    Sleep apnea    recently diagnosed- wears cpap   Stroke ([place]) 06/2019    Social History:  Social History   Socioeconomic History   Marital status: Divorced    Spouse name: Not on  file   Number of children: 3   Years of education: Not on file   Highest education level: Not on file  Occupational History   Occupation: retired    Fish farm manager: [employer]  Tobacco Use   Smoking status: Never   Smokeless tobacco: Never  Vaping Use   Vaping Use: Never used  Substance and Sexual Activity   Alcohol use: Yes    Alcohol/week: 0.0 standard drinks    Comment: rarely   Drug use: No   Sexual activity: Not Currently  Other Topics Concern   Not on file  Social History Narrative    Lives alone   Right Handed   Drinks caffeine very seldomly   Social Determinants of Health   Financial Resource Strain: Not on file  Food Insecurity: Not on file  Transportation Needs: Not on file  Physical Activity: Not on file  Stress: Not on file  Social Connections: Not on file  Intimate Partner Violence: Not on file    Medications:   Current Outpatient Medications on File Prior to Visit  Medication Sig Dispense Refill   ACCU-CHEK AVIVA PLUS test strip USE BID FOR GLUCOSE TESTING  11   amLODipine (NORVASC) 10 MG tablet Take 10 mg by mouth daily.     aspirin 81 MG chewable tablet Chew 1 tablet (81 mg total) by mouth daily. 30 tablet 0   atorvastatin (LIPITOR) 20 MG tablet Take 20 mg by mouth daily.     cyclobenzaprine (FLEXERIL) 5 MG tablet Take 5 mg by mouth at bedtime.      diclofenac sodium (VOLTAREN) 1 % GEL Apply 2 g topically daily as needed (pain).      feeding supplement, GLUCERNA SHAKE, (GLUCERNA SHAKE) LIQD Take 237 mLs by mouth 3 (three) times daily between meals. 21330 mL 1   gabapentin (NEURONTIN) 100 MG capsule TAKE 1 CAPSULE(100 MG) BY MOUTH TWICE DAILY 60 capsule 0   glimepiride (AMARYL) 4 MG tablet Take 4 mg by mouth daily with breakfast.      losartan (COZAAR) 25 MG tablet Take 1 tablet (25 mg total) by mouth daily. 30 tablet 1   metFORMIN (GLUCOPHAGE) 1000 MG tablet Take 1,000 mg by mouth 2 (two) times daily.  0   omeprazole (PRILOSEC) 20 MG capsule Take 20 mg by mouth daily.      SYMBICORT 80-4.5 MCG/ACT inhaler Inhale 2 puffs into the lungs 2 (two) times daily.     tiZANidine (ZANAFLEX) 4 MG tablet Take 4 mg by mouth 2 (two) times daily.     topiramate (TOPAMAX) 100 MG tablet Take 100 mg by mouth daily.     TRULICITY 9.62 IW/9.7LG SOPN Take 0.75 mg by mouth every Monday.     VENTOLIN HFA 108 (90 Base) MCG/ACT inhaler Inhale 2 puffs into the lungs every 4 (four) hours as needed for wheezing or shortness of breath.   5   allopurinol (ZYLOPRIM) 100 MG tablet  Take 1 tablet (100 mg total) by mouth daily. (Patient not taking: Reported on 09/18/2019) 30 tablet 0   ferrous sulfate 325 (65 FE) MG tablet Take 1 tablet (325 mg total) by mouth daily with breakfast. 30 tablet 0   No current facility-administered medications on file prior to visit.    Allergies:  No Known Allergies  Physical Exam General: Pleasant middle-aged African-American lady, seated, in no evident distress Head: head normocephalic and atraumatic.   Neck: supple with no carotid or supraclavicular bruits Cardiovascular: regular rate and rhythm, no murmurs Musculoskeletal:  no deformity Skin:  no rash/petichiae Vascular:  Normal pulses all extremities  Neurologic Exam Mental Status: Awake and fully alert. Oriented to place and time. Recent and remote memory diminished. Attention span, concentration and fund of knowledge appropriate. Mood and affect appropriate.  Mini-Mental status exam score 23/30 ( last visit 26/30.)  Diminished recall 1/3.  Able to name only 8 animals which can walk on 4 legs.  Clock drawing 2/4.  Slight difficulty with copying intersecting pentagons. Cranial Nerves: Fundoscopic exam  not done. Pupils equal, briskly reactive to light. Extraocular movements full without nystagmus. Visual fields full to confrontation. Hearing intact. Facial sensation intact. Face, tongue, palate moves normally and symmetrically.  Motor: Normal bulk and tone. Normal strength in all tested extremity muscles. Sensory.: intact to touch , pinprick , position and vibratory sensation.  Coordination: Rapid alternating movements normal in all extremities. Finger-to-nose and heel-to-shin performed accurately bilaterally. Gait and Station: Arises from chair without difficulty. Stance is normal. Gait demonstrates normal stride length and balance . Able to heel, toe and tandem walk without difficulty.  Reflexes: 1+ and symmetric. Toes downgoing.       ASSESSMENT: 70 year old lady with subacute  progressive memory and cognitive worsening over the last 1 year likely due to mild cognitive impairment.  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  Vascular risk factors of diabetes, hyperlipidemia, hypertension, mild hyper homocystinemia and obesity.     PLAN: I had a long discussion with the patient regarding the results of her lab work and EEG and answered questions.  I recommend she start folic acid 1 mg daily to help with her elevated homocystine and not smoke.  Also start Aricept 5 mg at night for 4 weeks and increase if tolerated without side effects to 10 mg daily.  She will stay on aspirin for stroke prevention and maintain aggressive risk factor modification with strict control of hypertension with blood pressure goal below 130/90, diabetes with hemoglobin A1c goal below 6.5% and lipids with LDL cholesterol goal below 70 mg percent.  She will return for follow-up in the future in 2 months or call earlier if necessary. Greater than 50% time during this 35-minute  visit was spent in counseling and coordination of care about her memory loss and mild cognitive impairment and answering questions. Antony Contras, MD Note: This document was prepared with digital dictation and possible smart phrase technology. Any transcriptional errors that result from this process are unintentional.

## 2021-01-10 ENCOUNTER — Ambulatory Visit (INDEPENDENT_AMBULATORY_CARE_PROVIDER_SITE_OTHER): Payer: 59

## 2021-01-10 DIAGNOSIS — I639 Cerebral infarction, unspecified: Secondary | ICD-10-CM

## 2021-01-11 LAB — CUP PACEART REMOTE DEVICE CHECK
Date Time Interrogation Session: 20220726011547
Implantable Pulse Generator Implant Date: 20210512

## 2021-01-21 ENCOUNTER — Ambulatory Visit: Payer: Self-pay | Admitting: Adult Health

## 2021-02-05 NOTE — Progress Notes (Signed)
Carelink Summary Report / Loop Recorder 

## 2021-02-12 ENCOUNTER — Ambulatory Visit (INDEPENDENT_AMBULATORY_CARE_PROVIDER_SITE_OTHER): Payer: 59

## 2021-02-12 DIAGNOSIS — I639 Cerebral infarction, unspecified: Secondary | ICD-10-CM

## 2021-02-12 LAB — CUP PACEART REMOTE DEVICE CHECK
Date Time Interrogation Session: 20220828011931
Implantable Pulse Generator Implant Date: 20210512

## 2021-02-25 ENCOUNTER — Ambulatory Visit: Payer: Self-pay | Admitting: Adult Health

## 2021-02-25 NOTE — Progress Notes (Signed)
Carelink Summary Report / Loop Recorder 

## 2021-03-12 ENCOUNTER — Other Ambulatory Visit: Payer: Self-pay | Admitting: Neurology

## 2021-04-01 ENCOUNTER — Ambulatory Visit: Payer: Self-pay | Admitting: Adult Health

## 2021-04-02 ENCOUNTER — Ambulatory Visit: Payer: 59 | Admitting: Neurology

## 2021-05-06 ENCOUNTER — Ambulatory Visit: Payer: Self-pay | Admitting: Adult Health

## 2021-05-23 DIAGNOSIS — J45909 Unspecified asthma, uncomplicated: Secondary | ICD-10-CM | POA: Insufficient documentation

## 2021-05-23 DIAGNOSIS — K760 Fatty (change of) liver, not elsewhere classified: Secondary | ICD-10-CM | POA: Insufficient documentation

## 2021-05-23 DIAGNOSIS — E785 Hyperlipidemia, unspecified: Secondary | ICD-10-CM | POA: Insufficient documentation

## 2021-05-23 DIAGNOSIS — G8929 Other chronic pain: Secondary | ICD-10-CM | POA: Insufficient documentation

## 2021-06-28 ENCOUNTER — Other Ambulatory Visit: Payer: Self-pay

## 2021-06-28 ENCOUNTER — Encounter: Payer: Self-pay | Admitting: Pulmonary Disease

## 2021-06-28 ENCOUNTER — Ambulatory Visit (INDEPENDENT_AMBULATORY_CARE_PROVIDER_SITE_OTHER): Payer: Commercial Managed Care - HMO | Admitting: Pulmonary Disease

## 2021-06-28 VITALS — BP 116/68 | HR 65 | Ht 64.0 in | Wt 178.2 lb

## 2021-06-28 DIAGNOSIS — Z9989 Dependence on other enabling machines and devices: Secondary | ICD-10-CM | POA: Diagnosis not present

## 2021-06-28 DIAGNOSIS — G4733 Obstructive sleep apnea (adult) (pediatric): Secondary | ICD-10-CM

## 2021-06-28 NOTE — Patient Instructions (Signed)
We will call your medical supply company to see whether you need a new study  We will schedule you for home sleep test if one is needed  We will update you with results once reviewed  I will see you back in about 4 to 6 months  Call us with significant concerns   Continue using your current machine at present

## 2021-06-28 NOTE — Progress Notes (Addendum)
[name]    [id number]    August 04, 1950  Primary Care Physician:Nguyen, Maudie Mercury, NP  Referring Physician: Arthur Holms, NP [place],  [place] [address]  Chief complaint:   Patient with a history of obstructive sleep apnea  HPI:  History of obstructive sleep apnea, current machine almost 69 years old, does not seem to be working as well as it did before  She does have snoring, headaches in the morning, dryness of her mouth in the morning  She tries to use the machine nightly but does not feel it is working as well  Usually goes to bed between 1030 and 11 Takes about 50 minutes to fall asleep one awakening Usual wake up time between 8:39 AM  Weight is up about 20 pounds  No family history of obstructive sleep apnea  She does have a history of hypertension, asthma, diabetes, previous stroke  She was last seen in the office in 2014  Was using a CPAP regularly with good response Outpatient Encounter Medications as of 06/28/2021  Medication Sig   ACCU-CHEK AVIVA PLUS test strip USE BID FOR GLUCOSE TESTING   allopurinol (ZYLOPRIM) 100 MG tablet Take 1 tablet (100 mg total) by mouth daily.   amLODipine (NORVASC) 10 MG tablet Take 10 mg by mouth daily.   aspirin 81 MG chewable tablet Chew 1 tablet (81 mg total) by mouth daily.   atorvastatin (LIPITOR) 20 MG tablet Take 20 mg by mouth daily.   cyclobenzaprine (FLEXERIL) 5 MG tablet Take 5 mg by mouth at bedtime.    diclofenac sodium (VOLTAREN) 1 % GEL Apply 2 g topically daily as needed (pain).    donepezil (ARICEPT) 10 MG tablet TAKE 1 TABLET BY MOUTH AT BEDTIME   feeding supplement, GLUCERNA SHAKE, (GLUCERNA SHAKE) LIQD Take 237 mLs by mouth 3 (three) times daily between meals.   ferrous sulfate 325 (65 FE) MG tablet Take 1 tablet (325 mg total) by mouth daily with breakfast.   folic acid (FOLVITE) 1 MG tablet Take 1 tablet (1 mg total) by mouth daily.   gabapentin (NEURONTIN) 100 MG capsule TAKE 1  CAPSULE(100 MG) BY MOUTH TWICE DAILY   glimepiride (AMARYL) 4 MG tablet Take 4 mg by mouth daily with breakfast.    losartan (COZAAR) 25 MG tablet Take 1 tablet (25 mg total) by mouth daily.   metFORMIN (GLUCOPHAGE) 1000 MG tablet Take 1,000 mg by mouth 2 (two) times daily.   omeprazole (PRILOSEC) 20 MG capsule Take 20 mg by mouth daily.    SYMBICORT 80-4.5 MCG/ACT inhaler Inhale 2 puffs into the lungs 2 (two) times daily.   tiZANidine (ZANAFLEX) 4 MG tablet Take 4 mg by mouth 2 (two) times daily.   topiramate (TOPAMAX) 100 MG tablet Take 100 mg by mouth daily.   VENTOLIN HFA 108 (90 Base) MCG/ACT inhaler Inhale 2 puffs into the lungs every 4 (four) hours as needed for wheezing or shortness of breath.    [DISCONTINUED] TRULICITY 2.29 NL/8.9QJ SOPN Take 0.75 mg by mouth every Monday.   No facility-administered encounter medications on file as of 06/28/2021.    Allergies as of 06/28/2021   (No Known Allergies)    Past Medical History:  Diagnosis Date   Arthritis    Diabetes mellitus without complication (HCC)    GERD (gastroesophageal reflux disease)    H/O hiatal hernia    Headache(784.0)    Hyperlipidemia    Hypertension    Neuromuscular disorder (  [place])    neuropathy feet    Sleep apnea    recently diagnosed- wears cpap   Stroke ([place]) 06/2019    Past Surgical History:  Procedure Laterality Date   ABDOMINAL HYSTERECTOMY     partial   BACK SURGERY     BUBBLE STUDY  07/11/2019   Procedure: BUBBLE STUDY;  Surgeon: Dixie Dials, MD;  Location: [facility] ENDOSCOPY;  Service: Cardiovascular;;   CHOLECYSTECTOMY     COLONOSCOPY     age 39- doesnt know where or who    implantable loop recorder placement  10/26/2019   Medtronic Reveal [place] model [id number] ([id number]) implantable loop recorder implanted by Dr Rayann Heman for cryptogenic stroke   left shoulder     x 2 surgeries   right shoulder     x2   SHOULDER OPEN ROTATOR CUFF REPAIR Right 08/25/2012   Procedure: ROTATOR CUFF REPAIR SHOULDER  OPEN WITH ACROMIOPLASTY;  Surgeon: Tobi Bastos, MD;  Location: [facility] ORS;  Service: Orthopedics;  Laterality: Right;   TEE WITHOUT CARDIOVERSION N/A 07/11/2019   Procedure: TRANSESOPHAGEAL ECHOCARDIOGRAM (TEE);  Surgeon: Dixie Dials, MD;  Location: [facility] ENDOSCOPY;  Service: Cardiovascular;  Laterality: N/A;   TONSILLECTOMY      Family History  Problem Relation Age of Onset   Colon cancer Father    Colon cancer Paternal Grandfather    Colon cancer Maternal Grandfather    Bone cancer Maternal Uncle    Colon polyps Neg Hx    Esophageal cancer Neg Hx    Rectal cancer Neg Hx    Stomach cancer Neg Hx     Social History   Socioeconomic History   Marital status: Divorced    Spouse name: Not on file   Number of children: 3   Years of education: Not on file   Highest education level: Not on file  Occupational History   Occupation: retired    Fish farm manager: [employer]  Tobacco Use   Smoking status: Never   Smokeless tobacco: Never  Vaping Use   Vaping Use: Never used  Substance and Sexual Activity   Alcohol use: Yes    Alcohol/week: 0.0 standard drinks    Comment: rarely   Drug use: No   Sexual activity: Not Currently  Other Topics Concern   Not on file  Social History Narrative   Lives alone   Right Handed   Drinks caffeine very seldomly   Social Determinants of Health   Financial Resource Strain: Not on file  Food Insecurity: Not on file  Transportation Needs: Not on file  Physical Activity: Not on file  Stress: Not on file  Social Connections: Not on file  Intimate Partner Violence: Not on file    Review of Systems  Constitutional:  Positive for fatigue.  Psychiatric/Behavioral:  Positive for sleep disturbance.    Vitals:   06/28/21 1015  BP: 116/68  Pulse: 65  SpO2: 100%   Physical Exam Constitutional:      Appearance: She is obese.  HENT:     Head: Normocephalic.     Nose: No congestion.     Mouth/Throat:     Mouth: Mucous membranes are moist.      Comments: Crowded oropharynx, macroglossia Cardiovascular:     Rate and Rhythm: Normal rate and regular rhythm.     Heart sounds: No murmur heard.   No friction rub.  Pulmonary:     Effort: No respiratory distress.     Breath sounds: No stridor. No wheezing or rhonchi.  Musculoskeletal:     Cervical back: No rigidity or tenderness.  Neurological:     Mental Status: She is alert.  Psychiatric:        Mood and Affect: Mood normal.   Results of the Epworth flowsheet 06/28/2021  Sitting and reading 0  Watching TV 0  Sitting, inactive in a public place (e.g. a theatre or a meeting) 0  As a passenger in a car for an hour without a break 0  Lying down to rest in the afternoon when circumstances permit 3  Sitting and talking to someone 0  Sitting quietly after a lunch without alcohol 0  In a car, while stopped for a few minutes in traffic 0  Total score 3     Data Reviewed: She does not have a smart card in the machine for download Previous documented download reviewed from 2014-was on auto titrating CPAP  Assessment:  Obstructive sleep apnea -Compliant with CPAP -Patient does not seem to be working as well  Obesity  Excessive daytime sleepiness  Pathophysiology of sleep disordered breathing discussed with the patient Options moving forward discussed with her  Plan/Recommendations: We will confirm with DME company whether she needs a new study  Auto titrating CPAP may work for her  Weight loss efforts encouraged  Encouraged to call us with any significant concerns  We may order a home sleep test if one is needed as last study was about 10 years ago   Sherrilyn Rist MD [facility] 06/28/2021, 10:35 AM  CC: Arthur Holms, NP  We will go ahead and order auto titrating CPAP 5-15, order will be sent to DME company  She is using CPAP nightly and benefiting from treatment, just feels machine is not working as optimally as previously

## 2021-06-28 NOTE — Addendum Note (Signed)
Addended by: Valerie Salts on: 06/28/2021 10:48 AM   Modules accepted: Orders

## 2021-07-01 ENCOUNTER — Ambulatory Visit (INDEPENDENT_AMBULATORY_CARE_PROVIDER_SITE_OTHER): Payer: Commercial Managed Care - HMO

## 2021-07-01 DIAGNOSIS — I639 Cerebral infarction, unspecified: Secondary | ICD-10-CM | POA: Diagnosis not present

## 2021-07-01 LAB — CUP PACEART REMOTE DEVICE CHECK
Date Time Interrogation Session: 20230115231102
Implantable Pulse Generator Implant Date: 20210512

## 2021-07-10 ENCOUNTER — Telehealth: Payer: Self-pay | Admitting: Cardiology

## 2021-07-10 NOTE — Telephone Encounter (Signed)
Patient is going to have an MRI, she wants to know what type of device she has implanted.

## 2021-07-10 NOTE — Telephone Encounter (Signed)
Successful telephone encounter to patient to discuss need for MRI and device compatibility. Informed patient that she may have MRI based on device information below. Provided patient with Device Clinic contact including fax number for MRI provider to contact if needed. Patient appreciative of follow up.   Medtronic [id number] Reveal [place] [id number] S Implant Date: 10/26/2019 for CVA

## 2021-07-11 ENCOUNTER — Other Ambulatory Visit (HOSPITAL_COMMUNITY): Payer: Self-pay | Admitting: Neurology

## 2021-07-11 ENCOUNTER — Other Ambulatory Visit: Payer: Self-pay | Admitting: Neurology

## 2021-07-11 DIAGNOSIS — G4459 Other complicated headache syndrome: Secondary | ICD-10-CM

## 2021-07-12 NOTE — Progress Notes (Signed)
Carelink Summary Report / Loop Recorder 

## 2021-07-15 ENCOUNTER — Other Ambulatory Visit: Payer: Self-pay | Admitting: Neurology

## 2021-07-15 NOTE — Telephone Encounter (Signed)
Rx refilled.

## 2021-07-16 ENCOUNTER — Other Ambulatory Visit: Payer: Self-pay

## 2021-07-16 ENCOUNTER — Ambulatory Visit (HOSPITAL_COMMUNITY)
Admission: RE | Admit: 2021-07-16 | Discharge: 2021-07-16 | Disposition: A | Discharge status: Home / Self Care | Payer: Medicare Other | Source: Ambulatory Visit | Attending: Neurology | Admitting: Neurology

## 2021-07-16 DIAGNOSIS — G4459 Other complicated headache syndrome: Secondary | ICD-10-CM | POA: Diagnosis not present

## 2021-07-24 ENCOUNTER — Ambulatory Visit: Payer: Medicaid Other | Admitting: Neurology

## 2021-08-05 ENCOUNTER — Ambulatory Visit (INDEPENDENT_AMBULATORY_CARE_PROVIDER_SITE_OTHER): Payer: Medicare Other

## 2021-08-05 DIAGNOSIS — I639 Cerebral infarction, unspecified: Secondary | ICD-10-CM | POA: Diagnosis not present

## 2021-08-05 LAB — CUP PACEART REMOTE DEVICE CHECK
Date Time Interrogation Session: 20230219230743
Implantable Pulse Generator Implant Date: 20210512

## 2021-08-12 NOTE — Progress Notes (Signed)
Carelink Summary Report / Loop Recorder 

## 2021-08-20 ENCOUNTER — Telehealth: Payer: Self-pay | Admitting: *Deleted

## 2021-08-20 ENCOUNTER — Encounter: Payer: Self-pay | Admitting: Neurology

## 2021-08-20 ENCOUNTER — Ambulatory Visit (INDEPENDENT_AMBULATORY_CARE_PROVIDER_SITE_OTHER): Payer: Medicare Other | Admitting: Neurology

## 2021-08-20 VITALS — BP 122/71 | HR 80 | Ht 64.0 in | Wt 180.1 lb

## 2021-08-20 DIAGNOSIS — R202 Paresthesia of skin: Secondary | ICD-10-CM | POA: Diagnosis not present

## 2021-08-20 DIAGNOSIS — G3184 Mild cognitive impairment, so stated: Secondary | ICD-10-CM

## 2021-08-20 DIAGNOSIS — M5412 Radiculopathy, cervical region: Secondary | ICD-10-CM | POA: Insufficient documentation

## 2021-08-20 DIAGNOSIS — R079 Chest pain, unspecified: Secondary | ICD-10-CM | POA: Insufficient documentation

## 2021-08-20 DIAGNOSIS — N189 Chronic kidney disease, unspecified: Secondary | ICD-10-CM | POA: Insufficient documentation

## 2021-08-20 DIAGNOSIS — M48061 Spinal stenosis, lumbar region without neurogenic claudication: Secondary | ICD-10-CM | POA: Diagnosis not present

## 2021-08-20 DIAGNOSIS — G629 Polyneuropathy, unspecified: Secondary | ICD-10-CM

## 2021-08-20 DIAGNOSIS — E1140 Type 2 diabetes mellitus with diabetic neuropathy, unspecified: Secondary | ICD-10-CM | POA: Insufficient documentation

## 2021-08-20 DIAGNOSIS — G8929 Other chronic pain: Secondary | ICD-10-CM | POA: Insufficient documentation

## 2021-08-20 DIAGNOSIS — L309 Dermatitis, unspecified: Secondary | ICD-10-CM | POA: Insufficient documentation

## 2021-08-20 DIAGNOSIS — I10 Essential (primary) hypertension: Secondary | ICD-10-CM | POA: Insufficient documentation

## 2021-08-20 DIAGNOSIS — K580 Irritable bowel syndrome with diarrhea: Secondary | ICD-10-CM | POA: Insufficient documentation

## 2021-08-20 DIAGNOSIS — M25562 Pain in left knee: Secondary | ICD-10-CM | POA: Insufficient documentation

## 2021-08-20 DIAGNOSIS — D638 Anemia in other chronic diseases classified elsewhere: Secondary | ICD-10-CM | POA: Insufficient documentation

## 2021-08-20 DIAGNOSIS — R413 Other amnesia: Secondary | ICD-10-CM

## 2021-08-20 MED ORDER — TOPIRAMATE 25 MG PO TABS
25.0000 mg | ORAL_TABLET | Freq: Every day | ORAL | 2 refills | Status: DC
Start: 2021-08-20 — End: 2021-08-20

## 2021-08-20 MED ORDER — TOPIRAMATE 100 MG PO TABS: 100.0000 mg | ORAL_TABLET | Freq: Two times a day (BID) | ORAL | 0 refills | Status: DC

## 2021-08-20 NOTE — Progress Notes (Signed)
[facility] [address] [place] [address] [phone]       OFFICE FOLLOW UP VISIT NOTE  [name] Date of Birth:  02-Jul-1950 Medical Record Number:  [id number]   Referring MD: Dustin Folks Reason for Referral: Memory loss HPI: Initial visit 09/22/2019:[name] is a 71 year old pleasant African-American lady seen today for initial office consultation visit for memory loss.  History is obtained from the patient and her granddaughter Danae Chen who is accompanying her.  I personally reviewed referral notes, electronic medical records and pertinent imaging films in PACS.  She has past medical history of diabetes, hypertension, hyperlipidemia, sleep apnea and arthritis.  She has been having memory difficulties for over a year.  She forgets her appointments as well as her medications.  She often gets disoriented.  She has good days and bad days.  She lives alone but her granddaughter keeps a close eye on her and helps her.  She is off later needed more help.  She has not been driving.  She can cook somewhat but grandchildren have been helping.  She needs supervision for a bath.  She has not had any hallucinations, delusions, violence or unsafe behavior.  She does get irritable and upset easily.  She denies any headaches, gait difficulties, double vision, focal extremity weakness numbness.  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  Loop recorder insertion has not shown paroxysmal A. fib yet.  She remains on aspirin for stroke prevention which is tolerating well without bruising or bleeding.  Her blood pressures well controlled today it is 136/73.  She cannot tell me when last lipid profile was checked or what her last hemoglobin A1c was.  There is no family Struve Alzheimer's or dementia. Update 11/29/2020: She returns for follow-up after last  visit 2 months ago.  She continues to have memory difficulties but she states this may be getting better.  She has been perspiring and doing memory challenging activities.  She continues to live alone.  Her grandson helps her and checks on her daily.  She is still able to do most actives of daily living for herself.  She denies any delusions, hallucinations, agitation or unsafe behavior.  She had lab work done at last visit on 09/17/2020 which showed normal vitamin B12, TSH.  RPR was negative.  Homocystine was elevated at 20.9.  I had advised her to start taking folic acid but she has not done that yet.  LDL cholesterol was 58 mg percent and hemoglobin A1c was 6.9.  EEG done on 10/16/2020 showed mild generalized slowing which is nonspecific.  She has not been tried yet on medication like Aricept or Namenda but is willing to try. Update 08/20/2021 : Patient is referred back today by her nurse practitioner Maudie Mercury for evaluation for bilateral foot and right hand paresthesias.  Patient states this has been going on for several years.  She does have a history of a lumbar spinal stenosis and had a previous MRI done few years ago which had shown mild spinal stenosis.  However she denies any significant back pain, radicular pain, symptoms of neurogenic claudication.  She has had no recent back injury or fall.  She complains of mostly tingling and numbness in her feet bilaterally right more than left and occasionally in the right hand as well.  No specific triggers.  She has been started  on gabapentin 303 times daily which she seems to be tolerating well but feels is not particularly helping.  She denies any difficulty with walking balance falls or injuries.  She does have diabetes but feels it is under good control and last hemoglobin A1c checked in January this year was satisfactory as per the patient but I do not have the results to share.  Patient was started by me on Aricept at last visit for cognitive impairment and seems to  be doing well on that.  She likes it as not having any side effects and she feels her memory is much improved and she is more independent in activities of daily living and can manage more by herself.  She still lives alone.  She denies any delusion, hallucinations, unsafe behavior.  She denies any nausea, vomiting, dizziness.  She denies any recurrent symptoms of strokes or TIAs.  She remains on aspirin which is tolerating well without bleeding or bruising.  She states her blood pressures under good control today it is 122/71.  She is tolerating her statin as well as diabetes medicines well. ROS:   14 system review of systems is positive for memory loss, forgetfulness, disorientation, numbness, tingling and all other systems negative PMH:  Past Medical History:  Diagnosis Date   Arthritis    Diabetes mellitus without complication (HCC)    GERD (gastroesophageal reflux disease)    H/O hiatal hernia    Headache(784.0)    Hyperlipidemia    Hypertension    Neuromuscular disorder (HCC)    neuropathy feet    Sleep apnea    recently diagnosed- wears cpap   Stroke ([place]) 06/2019    Social History:  Social History   Socioeconomic History   Marital status: Divorced    Spouse name: Not on file   Number of children: 3   Years of education: Not on file   Highest education level: Not on file  Occupational History   Occupation: retired    Fish farm manager: [employer]  Tobacco Use   Smoking status: Never   Smokeless tobacco: Never  Vaping Use   Vaping Use: Never used  Substance and Sexual Activity   Alcohol use: Yes    Alcohol/week: 0.0 standard drinks    Comment: rarely   Drug use: No   Sexual activity: Not Currently  Other Topics Concern   Not on file  Social History Narrative   Lives alone   Right Handed   Drinks caffeine very seldomly   Social Determinants of Health   Financial Resource Strain: Not on file  Food Insecurity: Not on file  Transportation Needs: Not on file  Physical  Activity: Not on file  Stress: Not on file  Social Connections: Not on file  Intimate Partner Violence: Not on file    Medications:   Current Outpatient Medications on File Prior to Visit  Medication Sig Dispense Refill   ACCU-CHEK AVIVA PLUS test strip USE BID FOR GLUCOSE TESTING  11   amLODipine (NORVASC) 10 MG tablet Take 10 mg by mouth daily.     aspirin 81 MG chewable tablet Chew 1 tablet (81 mg total) by mouth daily. 30 tablet 0   atorvastatin (LIPITOR) 20 MG tablet Take 20 mg by mouth daily.     cyclobenzaprine (FLEXERIL) 5 MG tablet Take 5 mg by mouth at bedtime.      diclofenac sodium (VOLTAREN) 1 % GEL Apply 2 g topically daily as needed (pain).      donepezil (ARICEPT)  10 MG tablet TAKE 1 TABLET BY MOUTH AT BEDTIME 30 tablet 3   feeding supplement, GLUCERNA SHAKE, (GLUCERNA SHAKE) LIQD Take 237 mLs by mouth 3 (three) times daily between meals. 32951 mL 1   folic acid (FOLVITE) 1 MG tablet Take 1 tablet (1 mg total) by mouth daily. 90 tablet 1   glimepiride (AMARYL) 4 MG tablet Take 4 mg by mouth daily with breakfast.      losartan (COZAAR) 25 MG tablet Take 1 tablet (25 mg total) by mouth daily. 30 tablet 1   omeprazole (PRILOSEC) 20 MG capsule Take 20 mg by mouth daily.      tiZANidine (ZANAFLEX) 4 MG tablet Take 4 mg by mouth 2 (two) times daily.     allopurinol (ZYLOPRIM) 100 MG tablet Take 1 tablet (100 mg total) by mouth daily. 30 tablet 0   ferrous sulfate 325 (65 FE) MG tablet Take 1 tablet (325 mg total) by mouth daily with breakfast. 30 tablet 0   No current facility-administered medications on file prior to visit.    Allergies:  No Known Allergies  Physical Exam General: Pleasant middle-aged African-American lady, seated, in no evident distress Head: head normocephalic and atraumatic.   Neck: supple with no carotid or supraclavicular bruits Cardiovascular: regular rate and rhythm, no murmurs Musculoskeletal: no deformity Skin:  no rash/petichiae Vascular:   Normal pulses all extremities  Neurologic Exam Mental Status: Awake and fully alert. Oriented to place and time. Recent and remote memory diminished. Attention span, concentration and fund of knowledge appropriate. Mood and affect appropriate.  Mini-Mental status exam score 25/30 ( last visit 26330.)  Diminished recall 1/3.  Able to name only 8 animals which can walk on 4 legs.  Clock drawing 4/4.  Slight difficulty with copying intersecting pentagons. Cranial Nerves: Fundoscopic exam  not done. Pupils equal, briskly reactive to light. Extraocular movements full without nystagmus. Visual fields full to confrontation. Hearing intact. Facial sensation intact. Face, tongue, palate moves normally and symmetrically.  Motor: Normal bulk and tone. Normal strength in all tested extremity muscles. Sensory.:  Hyperesthesia to touch and pinprick from ankle down bilaterally.  Position sense is normal.  Impaired vibration over toes bilaterally.  Negative Romberg sign Coordination: Rapid alternating movements normal in all extremities. Finger-to-nose and heel-to-shin performed accurately bilaterally. Gait and Station: Arises from chair without difficulty. Stance is normal. Gait demonstrates normal stride length and balance . Able to heel, toe and tandem walk without difficulty.  Reflexes: 1+ and symmetric. Toes downgoing.       ASSESSMENT: 71 year old lady with subacute progressive memory and cognitive worsening over the last 1 year likely due to mild cognitive impairment.  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  Vascular risk factors of diabetes, hyperlipidemia, hypertension, mild hyper homocystinemia and obesity.  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  She does have history of lumbar spinal stenosis but no active symptoms.     PLAN: I had a long discussion with the patient regarding her new  complaints of bilateral foot and right hand paresthesias which likely are due to small fiber neuropathy from her diabetes need further evaluation check an EMG nerve conduction study.  Trial of Topamax 25 mg twice daily to help with paresthesias.  Continue gabapentin the current dose for now but may consider reducing it in the future if Topamax is effective.  Strict control of diabetes with hemoglobin A1c goal below 6.5%.  Continue Aricept and the current dose of 10 mg daily for her memory loss and cognitive impairment which appears stable.  She was also encouraged to increase participation in cognitively challenging activities like solving crossword puzzles, playing bridge and sudoku.  Continue aspirin for stroke prevention with aggressive risk factor modification with strict control of hypertension with blood pressure goal below 130/90, lipids with LDL cholesterol goal below 70 mg percent and diabetes with hemoglobin A1c goal below 6.5% she will return for follow-up in the future in 3 months or call earlier if necessary.Greater than 50% time during this 45-minute prolonged visit was spent in counseling and coordination of care about her paresthesias and likely underlying diabetic neuropathy and lumbar spinal stenosis memory loss and mild cognitive impairment and answering questions. Antony Contras, MD Note: This document was prepared with digital dictation and possible smart phrase technology. Any transcriptional errors that result from this process are unintentional.

## 2021-08-20 NOTE — Telephone Encounter (Signed)
Pt was seen today for paresthesia and provided a trial of topiramate '25mg'$ , one tab BID. ? ?I got a call from [facility] that the patient is already taking toprimate '100mg'$ , one cap QHS for migraine prevention. ? ?I spoke to the patient and she confirmed this is correct. She did not realize she was being prescribed the same medication today and failed to mention she was already on it.  ? ?She is also taking gabapentin '300mg'$ , one cap TID.  ? ?She would like to try an alternate medication for the paresthesia.  ?

## 2021-08-20 NOTE — Patient Instructions (Signed)
I had a long discussion with the patient regarding her new complaints of bilateral foot and right hand paresthesias which likely are due to small fiber neuropathy from her diabetes need further evaluation check an EMG nerve conduction study.  Trial of Topamax 25 mg twice daily to help with paresthesias.  Continue gabapentin the current dose for now but may consider reducing it in the future if Topamax is effective.  Strict control of diabetes with hemoglobin A1c goal below 6.5%.  Continue Aricept and the current dose of 10 mg daily for her memory loss and cognitive impairment which appears stable.  She was also encouraged to increase participation in cognitively challenging activities like solving crossword puzzles, playing bridge and sudoku.  She will return for follow-up in the future in 3 months or call earlier if necessary. ? ?

## 2021-08-20 NOTE — Addendum Note (Signed)
Addended by: Noberto Retort C on: 08/20/2021 02:49 PM ? ? Modules accepted: Orders ? ?

## 2021-08-20 NOTE — Telephone Encounter (Signed)
She is agreeable to try the increased dose. She will start taking topiramate '100mg'$ , one tab BID. She will contact our office towards the end of the prescription to let us know if it working well. ?

## 2021-09-09 ENCOUNTER — Ambulatory Visit (INDEPENDENT_AMBULATORY_CARE_PROVIDER_SITE_OTHER): Payer: Medicare Other

## 2021-09-09 DIAGNOSIS — I639 Cerebral infarction, unspecified: Secondary | ICD-10-CM

## 2021-09-10 ENCOUNTER — Other Ambulatory Visit: Payer: Self-pay | Admitting: Neurology

## 2021-09-10 LAB — CUP PACEART REMOTE DEVICE CHECK
Date Time Interrogation Session: 20230325000527
Implantable Pulse Generator Implant Date: 20210512

## 2021-09-12 ENCOUNTER — Telehealth: Payer: Self-pay | Admitting: Neurology

## 2021-09-12 NOTE — Telephone Encounter (Signed)
Referral sent to [facility] [phone]. ?

## 2021-09-19 NOTE — Progress Notes (Signed)
Carelink Summary Report / Loop Recorder 

## 2021-09-25 ENCOUNTER — Other Ambulatory Visit: Payer: Self-pay | Admitting: Neurology

## 2021-09-28 ENCOUNTER — Emergency Department (HOSPITAL_COMMUNITY): Payer: Medicare Other

## 2021-09-28 ENCOUNTER — Emergency Department (HOSPITAL_COMMUNITY)
Admission: EM | Admit: 2021-09-28 | Discharge: 2021-09-28 | Disposition: A | Discharge status: Home / Self Care | Payer: Medicare Other | Attending: Emergency Medicine | Admitting: Emergency Medicine

## 2021-09-28 DIAGNOSIS — W182XXA Fall in (into) shower or empty bathtub, initial encounter: Secondary | ICD-10-CM | POA: Insufficient documentation

## 2021-09-28 DIAGNOSIS — Y93E1 Activity, personal bathing and showering: Secondary | ICD-10-CM | POA: Insufficient documentation

## 2021-09-28 DIAGNOSIS — S0003XA Contusion of scalp, initial encounter: Secondary | ICD-10-CM | POA: Diagnosis not present

## 2021-09-28 DIAGNOSIS — Z7902 Long term (current) use of antithrombotics/antiplatelets: Secondary | ICD-10-CM | POA: Insufficient documentation

## 2021-09-28 DIAGNOSIS — M25562 Pain in left knee: Secondary | ICD-10-CM | POA: Diagnosis not present

## 2021-09-28 DIAGNOSIS — M545 Low back pain, unspecified: Secondary | ICD-10-CM | POA: Diagnosis not present

## 2021-09-28 DIAGNOSIS — Z7982 Long term (current) use of aspirin: Secondary | ICD-10-CM | POA: Insufficient documentation

## 2021-09-28 DIAGNOSIS — S0990XA Unspecified injury of head, initial encounter: Secondary | ICD-10-CM | POA: Diagnosis present

## 2021-09-28 DIAGNOSIS — R42 Dizziness and giddiness: Secondary | ICD-10-CM

## 2021-09-28 LAB — COMPREHENSIVE METABOLIC PANEL
ALT: 27 U/L (ref 0–44)
AST: 16 U/L (ref 15–41)
Albumin: 3.2 g/dL — ABNORMAL LOW (ref 3.5–5.0)
Alkaline Phosphatase: 76 U/L (ref 38–126)
Anion gap: 6 (ref 5–15)
BUN: 32 mg/dL — ABNORMAL HIGH (ref 8–23)
CO2: 22 mmol/L (ref 22–32)
Calcium: 9.3 mg/dL (ref 8.9–10.3)
Chloride: 109 mmol/L (ref 98–111)
Creatinine, Ser: 1.42 mg/dL — ABNORMAL HIGH (ref 0.44–1.00)
GFR, Estimated: 40 mL/min — ABNORMAL LOW (ref >60)
Glucose, Bld: 143 mg/dL — ABNORMAL HIGH (ref 70–99)
Potassium: 4.3 mmol/L (ref 3.5–5.1)
Sodium: 137 mmol/L (ref 135–145)
Total Bilirubin: 0.8 mg/dL (ref 0.3–1.2)
Total Protein: 6.5 g/dL (ref 6.5–8.1)

## 2021-09-28 LAB — CBC WITH DIFFERENTIAL/PLATELET
Abs Immature Granulocytes: 0.02 10*3/uL (ref 0.00–0.07)
Basophils Absolute: 0.1 10*3/uL (ref 0.0–0.1)
Basophils Relative: 1 %
Eosinophils Absolute: 0.6 10*3/uL — ABNORMAL HIGH (ref 0.0–0.5)
Eosinophils Relative: 11 %
HCT: 39.8 % (ref 36.0–46.0)
Hemoglobin: 13 g/dL (ref 12.0–15.0)
Immature Granulocytes: 0 %
Lymphocytes Relative: 31 %
Lymphs Abs: 1.6 10*3/uL (ref 0.7–4.0)
MCH: 30.2 pg (ref 26.0–34.0)
MCHC: 32.7 g/dL (ref 30.0–36.0)
MCV: 92.6 fL (ref 80.0–100.0)
Monocytes Absolute: 0.6 10*3/uL (ref 0.1–1.0)
Monocytes Relative: 11 %
Neutro Abs: 2.4 10*3/uL (ref 1.7–7.7)
Neutrophils Relative %: 46 %
Platelets: 89 10*3/uL — ABNORMAL LOW (ref 150–400)
RBC: 4.3 MIL/uL (ref 3.87–5.11)
RDW: 13.4 % (ref 11.5–15.5)
WBC: 5.1 10*3/uL (ref 4.0–10.5)
nRBC: 0 % (ref 0.0–0.2)

## 2021-09-28 MED ORDER — MECLIZINE HCL 25 MG PO TABS: 25.0000 mg | ORAL_TABLET | Freq: Three times a day (TID) | ORAL | 0 refills | Status: DC | PRN

## 2021-09-28 MED ORDER — MECLIZINE HCL 25 MG PO TABS
25.0000 mg | ORAL_TABLET | Freq: Once | ORAL | Status: AC
  Administered 2021-09-28: 25 mg via ORAL
  Filled 2021-09-28: qty 1

## 2021-09-28 MED ORDER — SODIUM CHLORIDE 0.9 % IV BOLUS
1000.0000 mL | Freq: Once | INTRAVENOUS | Status: AC
  Administered 2021-09-28: 1000 mL via INTRAVENOUS

## 2021-09-28 NOTE — Progress Notes (Signed)
Orthopedic Tech Progress Note ?Patient Details:  ?[name] ?October 01, 1950 ?[id number] ? ?Level 2 trauma ? ?Patient ID: [name], female   DOB: 1950/07/17, 71 y.o.   MRN: [id number] ? ?[name] ?09/28/2021, 11:07 AM ? ?

## 2021-09-28 NOTE — ED Provider Notes (Signed)
?[facility] ?Provider Note ? ? ?CSN: [id number] ?Arrival date & time: 09/28/21  1044 ? ?  ? ?History ? ?No chief complaint on file. ? ? ?[name] is a 71 y.o. female. ? ?71 yo F with a chief complaints of a fall.  The patient actually fell yesterday she was trying to get out of the shower and she hit the lip of it and fell down and landed on her face.  She has no complaints.  Tells me that her head does not hurt she cannot see out of that eye but only because it is swollen.  She denies any pain to her neck or back.  She then recants and says that she does have some low back pain.  Has been going on for a while but is a bit worse than it is typically.  She also has some pain to her left knee. ? ? ? ?  ? ?Home Medications ?Prior to Admission medications   ?Medication Sig Start Date End Date Taking? Authorizing Provider  ?meclizine (ANTIVERT) 25 MG tablet Take 1 tablet (25 mg total) by mouth 3 (three) times daily as needed for dizziness. 09/28/21  Yes Deno Etienne, DO  ?ACCU-CHEK AVIVA PLUS test strip USE BID FOR GLUCOSE TESTING 12/19/14   [provider]  ?allopurinol (ZYLOPRIM) 100 MG tablet Take 1 tablet (100 mg total) by mouth daily. 07/12/19 06/28/21  Manuella Ghazi, Pratik D, DO  ?amLODipine (NORVASC) 10 MG tablet Take 10 mg by mouth daily. 04/15/19   [provider]  ?aspirin 81 MG chewable tablet Chew 1 tablet (81 mg total) by mouth daily. 09/20/19   Simmons-Robinson, Makiera, MD  ?atorvastatin (LIPITOR) 20 MG tablet Take 20 mg by mouth daily. 05/03/19   [provider]  ?cyclobenzaprine (FLEXERIL) 5 MG tablet Take 5 mg by mouth at bedtime.  01/17/19   [provider]  ?diclofenac sodium (VOLTAREN) 1 % GEL Apply 2 g topically daily as needed (pain).  01/17/19   [provider]  ?donepezil (ARICEPT) 10 MG tablet TAKE 1 TABLET BY MOUTH AT BEDTIME 07/15/21   Garvin Fila, MD  ?feeding supplement, GLUCERNA SHAKE, (GLUCERNA SHAKE) LIQD Take  237 mLs by mouth 3 (three) times daily between meals. 09/19/19   Simmons-Robinson, Riki Sheer, MD  ?ferrous sulfate 325 (65 FE) MG tablet Take 1 tablet (325 mg total) by mouth daily with breakfast. 07/12/19 06/28/21  Heath Lark D, DO  ?folic acid (FOLVITE) 1 MG tablet Take 1 tablet (1 mg total) by mouth daily. 11/29/20   Garvin Fila, MD  ?gabapentin (NEURONTIN) 300 MG capsule Take 300 mg by mouth 3 (three) times daily.    [provider]  ?glimepiride (AMARYL) 4 MG tablet Take 4 mg by mouth daily with breakfast.  11/23/18   [provider]  ?losartan (COZAAR) 25 MG tablet Take 1 tablet (25 mg total) by mouth daily. 10/23/15   Barton Dubois, MD  ?omeprazole (PRILOSEC) 20 MG capsule Take 20 mg by mouth daily.  10/05/14   [provider]  ?tiZANidine (ZANAFLEX) 4 MG tablet Take 4 mg by mouth 2 (two) times daily. 09/16/19   [provider]  ?topiramate (TOPAMAX) 100 MG tablet TAKE 1 TABLET BY MOUTH 2 TIMES DAILY 09/12/21   Garvin Fila, MD  ?   ? ?Allergies    ?Patient has no known allergies.   ? ?Review of Systems   ?Review of Systems ? ?Physical Exam ?Updated Vital Signs ?BP Marland Kitchen)  147/81   Pulse 74   Temp 98.6 ?F (37 ?C) (Oral)   Resp 14   Ht '5\' 3"'$  (1.6 m)   Wt 77.1 kg   SpO2 99%   BMI 30.11 kg/m?  ?Physical Exam ?Vitals and nursing note reviewed.  ?Constitutional:   ?   General: She is not in acute distress. ?   Appearance: She is well-developed. She is not diaphoretic.  ?HENT:  ?   Head: Normocephalic.  ?   Comments: Left periorbital hematoma.  Extraocular motion intact.  Visual acuity intact. ?Eyes:  ?   Pupils: Pupils are equal, round, and reactive to light.  ?Cardiovascular:  ?   Rate and Rhythm: Normal rate and regular rhythm.  ?   Heart sounds: No murmur heard. ?  No friction rub. No gallop.  ?Pulmonary:  ?   Effort: Pulmonary effort is normal.  ?   Breath sounds: No wheezing or rales.  ?Abdominal:  ?   General: There is no distension.  ?   Palpations: Abdomen is soft.  ?    Tenderness: There is no abdominal tenderness.  ?Musculoskeletal:     ?   General: No tenderness.  ?   Cervical back: Normal range of motion and neck supple.  ?   Comments: Palpated from head to toe without any specific areas of bony tenderness.  ?Skin: ?   General: Skin is warm and dry.  ?Neurological:  ?   Mental Status: She is alert and oriented to person, place, and time.  ?Psychiatric:     ?   Behavior: Behavior normal.  ? ? ?ED Results / Procedures / Treatments   ?Labs ?(all labs ordered are listed, but only abnormal results are displayed) ?Labs Reviewed  ?CBC WITH DIFFERENTIAL/PLATELET - Abnormal; Notable for the following components:  ?    Result Value  ? Platelets 89 (*)   ? Eosinophils Absolute 0.6 (*)   ? All other components within normal limits  ?COMPREHENSIVE METABOLIC PANEL - Abnormal; Notable for the following components:  ? Glucose, Bld 143 (*)   ? BUN 32 (*)   ? Creatinine, Ser 1.42 (*)   ? Albumin 3.2 (*)   ? GFR, Estimated 40 (*)   ? All other components within normal limits  ? ? ?EKG ?None ? ?Radiology ?DG Lumbar Spine Complete ? ?Result Date: 09/28/2021 ?CLINICAL DATA:  Status post fall with low back pain. EXAM: LUMBAR SPINE - COMPLETE 5 VIEW COMPARISON:  December 12, 2020 FINDINGS: There is no evidence of lumbar spine fracture. Scoliosis of the spine is identified. Posterior fusion with intervertebral disc placement are identified at L4 through S1 unchanged. IMPRESSION: No acute fracture or dislocation. Electronically Signed   By: Abelardo Diesel M.D.   On: 09/28/2021 12:16  ? ?CT Head Wo Contrast ? ?Result Date: 09/28/2021 ?CLINICAL DATA:  Head injury. EXAM: CT HEAD WITHOUT CONTRAST TECHNIQUE: Contiguous axial images were obtained from the base of the skull through the vertex without intravenous contrast. RADIATION DOSE REDUCTION: This exam was performed according to the departmental dose-optimization program which includes automated exposure control, adjustment of the mA and/or kV according to  patient size and/or use of iterative reconstruction technique. COMPARISON:  07/07/2019. FINDINGS: Brain: No evidence of acute infarction, hemorrhage, hydrocephalus, extra-axial collection or mass lesion/mass effect. Vascular: No hyperdense vessel or unexpected calcification. Skull: Normal. Negative for fracture or focal lesion. Sinuses/Orbits: Left periorbital soft tissue swelling/hemorrhage extending from the left forehead. Visualized globes and orbits are otherwise unremarkable. Left sphenoid  sinus is mostly opacified. Remaining visualized sinuses are clear. Other: Small left frontal scalp contusion/hematoma. IMPRESSION: 1. No acute intracranial abnormalities.  No skull fracture. 2. Left frontal scalp contusion/hematoma extending to the preseptal periorbital soft tissues on the left. Electronically Signed   By: Lajean Manes M.D.   On: 09/28/2021 11:29   ? ?Procedures ?Procedures  ? ? ?Medications Ordered in ED ?Medications  ?sodium chloride 0.9 % bolus 1,000 mL (1,000 mLs Intravenous New Bag/Given 09/28/21 1100)  ?meclizine (ANTIVERT) tablet 25 mg (25 mg Oral Given 09/28/21 1208)  ? ? ?ED Course/ Medical Decision Making/ A&P ?  ?                        ?Medical Decision Making ?Amount and/or Complexity of Data Reviewed ?Labs: ordered. ?Radiology: ordered. ?ECG/medicine tests: ordered. ? ? ?71 yO F with a chief complaint of a fall.  The patient had a nonsyncopal fall yesterday while trying to get out of the shower.  Arrived as a level 2 trauma due to her taking Plavix for prior stroke.  We will obtain a CT scan of the head.  Patient complaining of some worsening low back pain will obtain a plain film of the low back.  She has been somewhat dizzy per EMS this is a chronic thing for her and seems to come and go.  We will give a dose of meclizine bolus of IV fluids and reassess. ? ?Patient was able to ambulate independently.  She feels subjectively dizzy.  Has trouble describing this.  On repeat assessment she tells me  she would like to try and go home at this time.  Lab work without significant anemia no significant electrolyte abnormality.  CT scan of the head without acute intracranial injury.  The patient asked about the med

## 2021-09-28 NOTE — Discharge Instructions (Signed)
Eat and drink consider how she can for the next 48 hours.  Please follow-up with your family doctor in the office.  Please return for worsening weakness or inability to walk. ?

## 2021-09-28 NOTE — ED Triage Notes (Signed)
Pt arrives via [facility] from home for Level 2 fall on thinners. Pt reportedly fell last night after exiting shower. Pt denies LOC. Pt reportedly has been having increasing leg weakness over the last two weeks. Pt on Plavix. L eye swollen on arrival.  ? ?#20 L AC by EMS ?

## 2021-09-28 NOTE — TOC Initial Note (Addendum)
Transition of Care (TOC) - Initial/Assessment Note  ? ? ?Patient Details  ?Name: [name] ?MRN: [id number] ?Date of Birth: 1950-08-08 ? ?Transition of Care (TOC) CM/SW Contact:    ?Verdell Carmine, RN ?Phone Number: ?09/28/2021, 2:14 PM ? ?Clinical Narrative:                 ? ?Patient had a fall with blood thinners on board. Discussed home health with patient. She does not drive, rarely gets out of house except MD appts, grandchildren drive her. Discussed with Dr Tyrone Nine orders in, Patient has [facility] Medicare, called [facility] for acceptance, left message.  ? [facility] cannot accept-  ?called [facility] for acceptance- [facility] cannot accept ?[facility]-awaiting reply ?[facility]- cannot leave message ?[facility]- cannot accept ?Called patient back and informed her about lack of acceptances. Encouraged her to speak to her PCP on Monday ? ?  ? ? ?Patient Goals and CMS Choice ?  ?  ?  ? ?Expected Discharge Plan and Services ?  ?  ? Discharged home ?  ?  ?                ?  ?  ?  ?  ?  ?  ?  ?  ?  ?  ? ?Prior Living Arrangements/Services ?  ?  ?  ?       ?  ?  ?  ?  ? ?Activities of Daily Living ?  ?  ? ?Permission Sought/Granted ?  ?  ?   ?   ?   ?   ? ?Emotional Assessment ?  ?  ?  ?  ?  ?  ? ?Admission diagnosis:  Level 2 ?Patient Active Problem List  ? Diagnosis Date Noted  ? Anemia of chronic disease 08/20/2021  ? Cervical radiculopathy 08/20/2021  ? Chest pain 08/20/2021  ? Chronic kidney disease 08/20/2021  ? Chronic pain of both knees 08/20/2021  ? Dermatitis 08/20/2021  ? Diabetic neuropathy ([place]) 08/20/2021  ? Hypertension 08/20/2021  ? Irritable bowel syndrome with diarrhea 08/20/2021  ? Asthma 05/23/2021  ? Chronic low back pain 05/23/2021  ? Hepatic steatosis 05/23/2021  ? Hyperlipidemia 05/23/2021  ? Cataract, nuclear sclerotic, both eyes 11/15/2020  ? Posterior subcapsular age-related cataract, both eyes 11/15/2020  ? Posterior vitreous detachment of both eyes 11/15/2020  ? Idiopathic  hypotension   ? Ataxia 09/18/2019  ? Cerebral embolism with cerebral infarction 07/08/2019  ? Acute kidney injury (nontraumatic) ([place]) 07/07/2019  ? Anemia associated with chronic renal failure 07/07/2019  ? Acute bronchiolitis 10/10/2017  ? Shortness of breath 10/21/2015  ? Acute bronchitis and bronchiolitis 10/21/2015  ? Lactic acidosis 10/21/2015  ? Benign essential HTN 10/21/2015  ? GERD (gastroesophageal reflux disease) 10/21/2015  ? Diabetes mellitus with nephropathy ([place]) 10/21/2015  ? Acute renal failure superimposed on stage 3 chronic kidney disease ([place]) 10/21/2015  ? Chronic arthritis 10/21/2015  ? SOB (shortness of breath) 10/21/2015  ? OSA (obstructive sleep apnea) 10/11/2012  ? Rotator cuff tear, non-traumatic 08/25/2012  ? ?PCP:  Arthur Holms, NP ?Pharmacy:   ?[facility] - [place], [place] - [address] ?[address] ?[place] [address] ?Phone: [phone] Fax: [phone] ? ? ? ? ?Social Determinants of Health (SDOH) Interventions ?  ? ?Readmission Risk Interventions ?   ? View : No data to display.  ?  ?  ?  ? ? ? ?

## 2021-09-28 NOTE — Progress Notes (Signed)
?   09/28/21 1104  ?Clinical Encounter Type  ?Visited With Health care provider  ?Visit Type Initial;ED;Trauma  ?Referral From Nurse  ? ?Chaplain responded to a trauma in the ED - level II fall from previous night. Per EMS, family knows the patient is here and someone should be on the way. Spiritual care services available as needed.  ? ?Jeri Lager, Chaplain ? ?

## 2021-09-28 NOTE — ED Notes (Signed)
Pt ambulated to bathroom with +1 assist. Pt reported dizziness, but otherwise steady ?

## 2021-09-28 NOTE — ED Notes (Addendum)
Trauma Response Nurse Documentation ? ? ?[name] is a 71 y.o. female arriving to [facility] ED via EMS ? ?On clopidogrel 75 mg daily. Trauma was activated as a Level 2 based on the following trauma criteria Elderly patients > 65 with head trauma on anti-coagulation (excluding ASA). Trauma team at the bedside on patient arrival. Patient cleared for CT by Dr. Tyrone Nine. Patient to CT with team. GCS 15. ? ?Per patient was getting out of the shower, slipped and fell hitting her forehead on the floor. Her son happened to be staying with her and was able to get her up to bed. This morning she felt fine but noticed she was having some gait issues while walking. Patient takes Plavix. Large hematoma to left side of forehead, swollen left eye. No pain to her eye. ? ?History  ? Past Medical History:  ?Diagnosis Date  ? Arthritis   ? Diabetes mellitus without complication ([place])   ? GERD (gastroesophageal reflux disease)   ? H/O hiatal hernia   ? Headache(784.0)   ? Hyperlipidemia   ? Hypertension   ? Neuromuscular disorder ([place])   ? neuropathy feet   ? Sleep apnea   ? recently diagnosed- wears cpap  ? Stroke [facility]) 06/2019  ?  ? Past Surgical History:  ?Procedure Laterality Date  ? ABDOMINAL HYSTERECTOMY    ? partial  ? BACK SURGERY    ? BUBBLE STUDY  07/11/2019  ? Procedure: BUBBLE STUDY;  Surgeon: Dixie Dials, MD;  Location: [facility];  Service: Cardiovascular;;  ? CHOLECYSTECTOMY    ? COLONOSCOPY    ? age 35- doesnt know where or who   ? implantable loop recorder placement  10/26/2019  ? Medtronic Reveal [place] model [id number]) implantable loop recorder implanted by Dr Rayann Heman for cryptogenic stroke  ? left shoulder    ? x 2 surgeries  ? right shoulder    ? x2  ? SHOULDER OPEN ROTATOR CUFF REPAIR Right 08/25/2012  ? Procedure: ROTATOR CUFF REPAIR SHOULDER OPEN WITH ACROMIOPLASTY;  Surgeon: Tobi Bastos, MD;  Location: [facility];  Service: Orthopedics;  Laterality: Right;  ? TEE WITHOUT CARDIOVERSION N/A  07/11/2019  ? Procedure: TRANSESOPHAGEAL ECHOCARDIOGRAM (TEE);  Surgeon: Dixie Dials, MD;  Location: [facility] END[facility]COPY;  Service: Cardiovascular;  Laterality: N/A;  ? TONSILLECTOMY    ?  ? ?Initial Focused Assessment (If applicable, or please see trauma documentation): ?A&Ox4, GCS 15, hematoma to left forehead, eye swollen shut ?No other obvious trauma, some minor lower back pain ? ?CT's Completed:   ?CT Head  ? ?Interventions:  ?IV, labs ?CT Head/XR Lspine ? ?Plan for disposition:  ?Discharge home  ? ?Event Summary: ?Imaging all negative, patient ambulated and to discharge home. ? ?Bedside handoff with ED RN Abe People.   ? ?Park Pope Quentavious Rittenhouse  ?Trauma Response RN ? ?Please call TRN at [phone] for further assistance. ?  ?

## 2021-09-29 ENCOUNTER — Telehealth: Payer: Self-pay

## 2021-09-29 NOTE — Telephone Encounter (Signed)
[facility] and gave them the message regarding unable to find Home Health at this time. Message was sent to provider, and ED CM number given if they have any questions.  ?

## 2021-10-01 ENCOUNTER — Telehealth: Payer: Self-pay | Admitting: *Deleted

## 2021-10-01 NOTE — Telephone Encounter (Signed)
[employer] Worker Loretha Stapler) returned call following up on message left from weekend [facility] CM.  RNCM reviewed chart to find that we were unable to secure home health services and is reaching out to PCP office for additional resources.  Loretha Stapler states she will make some phone calls to see if she can secure an agency for this pt to receive home health.  RNCM will continue working on it as well. ?

## 2021-10-07 ENCOUNTER — Other Ambulatory Visit: Payer: Self-pay | Admitting: Neurology

## 2021-10-10 LAB — CUP PACEART REMOTE DEVICE CHECK
Date Time Interrogation Session: 20230427002121
Implantable Pulse Generator Implant Date: 20210512

## 2021-10-14 ENCOUNTER — Ambulatory Visit (INDEPENDENT_AMBULATORY_CARE_PROVIDER_SITE_OTHER): Payer: Medicare Other

## 2021-10-14 DIAGNOSIS — I639 Cerebral infarction, unspecified: Secondary | ICD-10-CM | POA: Diagnosis not present

## 2021-10-17 ENCOUNTER — Other Ambulatory Visit: Payer: Self-pay | Admitting: Neurology

## 2021-10-28 NOTE — Progress Notes (Signed)
Carelink Summary Report / Loop Recorder 

## 2021-10-29 ENCOUNTER — Ambulatory Visit: Payer: Medicaid Other | Admitting: Cardiology

## 2021-10-30 ENCOUNTER — Telehealth: Payer: Self-pay | Admitting: Neurology

## 2021-10-30 NOTE — Telephone Encounter (Signed)
Rescheduled 6/28 appt with pt over the phone- Dr. Leonie Man out. ?

## 2021-11-18 ENCOUNTER — Ambulatory Visit (INDEPENDENT_AMBULATORY_CARE_PROVIDER_SITE_OTHER): Payer: Medicare Other

## 2021-11-18 DIAGNOSIS — I639 Cerebral infarction, unspecified: Secondary | ICD-10-CM

## 2021-11-18 LAB — CUP PACEART REMOTE DEVICE CHECK
Date Time Interrogation Session: 20230529230818
Implantable Pulse Generator Implant Date: 20210512

## 2021-11-20 ENCOUNTER — Other Ambulatory Visit: Payer: Self-pay | Admitting: Neurology

## 2021-11-21 ENCOUNTER — Encounter (INDEPENDENT_AMBULATORY_CARE_PROVIDER_SITE_OTHER): Payer: Medicare Other | Admitting: Ophthalmology

## 2021-12-05 NOTE — Progress Notes (Signed)
Carelink Summary Report / Loop Recorder 

## 2021-12-10 ENCOUNTER — Ambulatory Visit: Payer: Medicare Other | Admitting: Neurology

## 2021-12-11 ENCOUNTER — Ambulatory Visit: Payer: Medicare Other | Admitting: Neurology

## 2021-12-12 ENCOUNTER — Encounter: Payer: Medicaid Other | Admitting: Cardiology

## 2021-12-12 DIAGNOSIS — I639 Cerebral infarction, unspecified: Secondary | ICD-10-CM

## 2021-12-12 DIAGNOSIS — I10 Essential (primary) hypertension: Secondary | ICD-10-CM

## 2021-12-18 ENCOUNTER — Other Ambulatory Visit: Payer: Self-pay | Admitting: Registered Nurse

## 2021-12-18 DIAGNOSIS — Z1231 Encounter for screening mammogram for malignant neoplasm of breast: Secondary | ICD-10-CM

## 2021-12-20 LAB — CUP PACEART REMOTE DEVICE CHECK
Date Time Interrogation Session: 20230701230711
Implantable Pulse Generator Implant Date: 20210512

## 2021-12-23 ENCOUNTER — Ambulatory Visit (INDEPENDENT_AMBULATORY_CARE_PROVIDER_SITE_OTHER): Payer: Medicare Other

## 2021-12-23 DIAGNOSIS — I639 Cerebral infarction, unspecified: Secondary | ICD-10-CM

## 2021-12-24 ENCOUNTER — Ambulatory Visit: Payer: Medicare Other | Admitting: Neurology

## 2021-12-24 ENCOUNTER — Encounter: Payer: Self-pay | Admitting: Neurology

## 2021-12-24 ENCOUNTER — Other Ambulatory Visit: Payer: Self-pay | Admitting: Neurology

## 2021-12-26 ENCOUNTER — Ambulatory Visit: Payer: Medicare Other

## 2021-12-30 ENCOUNTER — Other Ambulatory Visit: Payer: Self-pay | Admitting: Neurology

## 2021-12-31 ENCOUNTER — Telehealth: Payer: Self-pay | Admitting: Neurology

## 2021-12-31 ENCOUNTER — Encounter (INDEPENDENT_AMBULATORY_CARE_PROVIDER_SITE_OTHER): Payer: Medicare Other | Admitting: Ophthalmology

## 2021-12-31 MED ORDER — TOPIRAMATE 100 MG PO TABS: 100.0000 mg | ORAL_TABLET | Freq: Two times a day (BID) | ORAL | 5 refills | Status: DC

## 2021-12-31 MED ORDER — DONEPEZIL HCL 10 MG PO TABS
10.0000 mg | ORAL_TABLET | Freq: Every day | ORAL | 5 refills | Status: DC
Start: 2021-12-31 — End: 2022-07-22

## 2021-12-31 NOTE — Telephone Encounter (Signed)
Pt called and made yearly appointment. Pt asked that provider give enough medication until Dec 14 th appointment. Pt is requesting a refill on donepezil (ARICEPT) 10 MG tablet and topiramate (TOPAMAX) 100 MG tablet. Pt would like the prescriptions called in to [facility]

## 2021-12-31 NOTE — Telephone Encounter (Signed)
I left a VM for the patient (as per DPR) for call back. As per the last office visit note,   "She will start taking topiramate '100mg'$ , one tab BID. She will contact our office towards the end of the prescription to let us know if it working well."   I asked for a return call to see how the medication is working for paresthesias. Dr. Leonie Man noted that he may increase the dosage, if appropriate.

## 2021-12-31 NOTE — Telephone Encounter (Addendum)
I spoke to the patient. She is doing well on topiramate '100mg'$ , one tablet twice daily. She could not get her follow up until 05/29/22. She will need refills of her topiramate and donepezil to avoid disruption to treatment. Prescriptions sent.

## 2021-12-31 NOTE — Addendum Note (Signed)
Addended by: Noberto Retort C on: 12/31/2021 12:27 PM   Modules accepted: Orders

## 2022-01-01 ENCOUNTER — Telehealth: Payer: Self-pay | Admitting: Neurology

## 2022-01-01 NOTE — Telephone Encounter (Signed)
I spoke with [name] at [facility] to confirm that the patient's topiramate was sent to [facility] yesterday. She verbalized understanding/appreciation for the call.

## 2022-01-01 NOTE — Telephone Encounter (Signed)
I spoke with care advocate Paramedic at [facility]. She attempted to connect me with [name] who was on another call. She will return the call once available.

## 2022-01-01 NOTE — Telephone Encounter (Signed)
[name] from [employer] is Calling and wants to know why Pt topiramate (TOPAMAX) 100 MG tablet was not approved and should they approved. [name] is requesting a returning calling.

## 2022-01-13 ENCOUNTER — Ambulatory Visit
Admission: RE | Admit: 2022-01-13 | Discharge: 2022-01-13 | Disposition: A | Discharge status: Home / Self Care | Payer: Medicare Other | Source: Ambulatory Visit | Attending: Registered Nurse | Admitting: Registered Nurse

## 2022-01-13 DIAGNOSIS — Z1231 Encounter for screening mammogram for malignant neoplasm of breast: Secondary | ICD-10-CM

## 2022-01-17 LAB — CUP PACEART REMOTE DEVICE CHECK
Date Time Interrogation Session: 20230803231608
Implantable Pulse Generator Implant Date: 20210512

## 2022-01-17 NOTE — Progress Notes (Signed)
Carelink Summary Report / Loop Recorder 

## 2022-01-20 ENCOUNTER — Ambulatory Visit (INDEPENDENT_AMBULATORY_CARE_PROVIDER_SITE_OTHER): Payer: Medicare Other

## 2022-01-20 DIAGNOSIS — I639 Cerebral infarction, unspecified: Secondary | ICD-10-CM

## 2022-02-19 LAB — CUP PACEART REMOTE DEVICE CHECK
Date Time Interrogation Session: 20230905232125
Implantable Pulse Generator Implant Date: 20210512

## 2022-02-20 NOTE — Progress Notes (Signed)
Carelink Summary Report / Loop Recorder 

## 2022-02-24 ENCOUNTER — Ambulatory Visit (INDEPENDENT_AMBULATORY_CARE_PROVIDER_SITE_OTHER): Payer: Medicare Other

## 2022-02-24 DIAGNOSIS — I639 Cerebral infarction, unspecified: Secondary | ICD-10-CM

## 2022-03-13 NOTE — Progress Notes (Signed)
Carelink Summary Report / Loop Recorder 

## 2022-03-27 LAB — CUP PACEART REMOTE DEVICE CHECK
Date Time Interrogation Session: 20231008232033
Implantable Pulse Generator Implant Date: 20210512

## 2022-03-31 ENCOUNTER — Ambulatory Visit (INDEPENDENT_AMBULATORY_CARE_PROVIDER_SITE_OTHER): Payer: Medicare Other

## 2022-03-31 DIAGNOSIS — I639 Cerebral infarction, unspecified: Secondary | ICD-10-CM | POA: Diagnosis not present

## 2022-04-08 ENCOUNTER — Encounter: Payer: Self-pay | Admitting: Gastroenterology

## 2022-04-23 NOTE — Progress Notes (Signed)
Carelink Summary Report / Loop Recorder 

## 2022-05-05 ENCOUNTER — Ambulatory Visit (INDEPENDENT_AMBULATORY_CARE_PROVIDER_SITE_OTHER): Payer: Medicare Other

## 2022-05-05 DIAGNOSIS — I639 Cerebral infarction, unspecified: Secondary | ICD-10-CM

## 2022-05-06 LAB — CUP PACEART REMOTE DEVICE CHECK
Date Time Interrogation Session: 20231119232024
Implantable Pulse Generator Implant Date: 20210512

## 2022-05-22 ENCOUNTER — Other Ambulatory Visit: Payer: Self-pay | Admitting: Registered Nurse

## 2022-05-22 ENCOUNTER — Ambulatory Visit
Admission: RE | Admit: 2022-05-22 | Discharge: 2022-05-22 | Disposition: A | Discharge status: Home / Self Care | Payer: Medicare Other | Source: Ambulatory Visit | Attending: Registered Nurse | Admitting: Registered Nurse

## 2022-05-22 DIAGNOSIS — M25559 Pain in unspecified hip: Secondary | ICD-10-CM

## 2022-05-22 DIAGNOSIS — M545 Low back pain, unspecified: Secondary | ICD-10-CM

## 2022-05-22 DIAGNOSIS — R52 Pain, unspecified: Secondary | ICD-10-CM

## 2022-05-29 ENCOUNTER — Ambulatory Visit: Payer: Medicare Other | Admitting: Neurology

## 2022-06-10 ENCOUNTER — Ambulatory Visit (INDEPENDENT_AMBULATORY_CARE_PROVIDER_SITE_OTHER): Payer: Medicare Other

## 2022-06-10 DIAGNOSIS — I639 Cerebral infarction, unspecified: Secondary | ICD-10-CM

## 2022-06-10 LAB — CUP PACEART REMOTE DEVICE CHECK
Date Time Interrogation Session: 20231225231408
Implantable Pulse Generator Implant Date: 20210512

## 2022-06-13 NOTE — Progress Notes (Signed)
Carelink Summary Report / Loop Recorder 

## 2022-06-27 ENCOUNTER — Telehealth: Payer: Self-pay | Admitting: Adult Health

## 2022-06-27 NOTE — Telephone Encounter (Signed)
Spoke with pt and scheduled OV with Dr. Ander Slade on 07/17/22. Nothing further needed at this time.

## 2022-06-27 NOTE — Telephone Encounter (Signed)
[name]; [name], [name],  Patient called in on 06-24-2022 asking to  move on with her cpap order. her order was sent in a year ago. At that time we processed the order and attempted to contact her to set her up, but we never were able to contact her and she never returned out calls.  We will need a new order and OV face 2 face notes to provide setup. Face 2 face notes need to explain why she was not setup after her sleep study.   Once these items are received we will review and process.  Thank you,  Demetrius Charity

## 2022-07-07 NOTE — Progress Notes (Signed)
Carelink Summary Report / Loop Recorder

## 2022-07-14 ENCOUNTER — Ambulatory Visit: Payer: 59

## 2022-07-14 DIAGNOSIS — I639 Cerebral infarction, unspecified: Secondary | ICD-10-CM

## 2022-07-15 LAB — CUP PACEART REMOTE DEVICE CHECK
Date Time Interrogation Session: 20240127231510
Implantable Pulse Generator Implant Date: 20210512

## 2022-07-17 ENCOUNTER — Ambulatory Visit: Payer: Medicare Other | Admitting: Pulmonary Disease

## 2022-07-21 ENCOUNTER — Other Ambulatory Visit: Payer: Self-pay | Admitting: Neurology

## 2022-08-01 ENCOUNTER — Encounter: Payer: Self-pay | Admitting: Pulmonary Disease

## 2022-08-01 ENCOUNTER — Ambulatory Visit (INDEPENDENT_AMBULATORY_CARE_PROVIDER_SITE_OTHER): Payer: 59

## 2022-08-01 ENCOUNTER — Ambulatory Visit (INDEPENDENT_AMBULATORY_CARE_PROVIDER_SITE_OTHER): Payer: 59 | Admitting: Pulmonary Disease

## 2022-08-01 VITALS — BP 122/82 | HR 91 | Ht 64.0 in | Wt 163.2 lb

## 2022-08-01 DIAGNOSIS — R0602 Shortness of breath: Secondary | ICD-10-CM | POA: Diagnosis not present

## 2022-08-01 DIAGNOSIS — G4733 Obstructive sleep apnea (adult) (pediatric): Secondary | ICD-10-CM

## 2022-08-01 NOTE — Progress Notes (Signed)
[name]    SW:[id number]    [birth year]  Primary Care Physician:Nguyen, Maudie Mercury, NP  Referring Physician: Arthur Holms, NP [address] [place],  [place] [address]  Chief complaint:   Patient with a history of obstructive sleep apnea  HPI:  Has not been able to get a new machine yet  Has been having some shortness of breath with activity Past history of stroke States she does not have any significant deficits from a stroke but limited with activities of daily living from shortness of breath Exercise tolerance less than 50 yards Denies any history of lung disease, denies heart disease Never smoker  Current machine is not working well  Snoring, morning headaches, dryness of the mouth in the mornings  Prescription was sent during the last visit to the office  Usually goes to bed between 1030 and 11 Takes about 50 minutes to fall asleep one awakening Usual wake up time between 8:39 AM  Weight is up about 20 pounds  No family history of obstructive sleep apnea  She does have a history of hypertension, asthma, diabetes, previous stroke  She was last seen in the office in 2014  Was using a CPAP regularly with good response Outpatient Encounter Medications as of 08/01/2022  Medication Sig   ACCU-CHEK AVIVA PLUS test strip USE BID FOR GLUCOSE TESTING   albuterol (VENTOLIN HFA) 108 (90 Base) MCG/ACT inhaler 1 puff inhaled 1-4 times daily PRN for 30 days   amLODipine (NORVASC) 10 MG tablet Take 10 mg by mouth daily.   aspirin 81 MG chewable tablet Chew 1 tablet (81 mg total) by mouth daily.   atorvastatin (LIPITOR) 20 MG tablet Take 20 mg by mouth daily.   cyclobenzaprine (FLEXERIL) 5 MG tablet Take 5 mg by mouth at bedtime.    diclofenac sodium (VOLTAREN) 1 % GEL Apply 2 g topically daily as needed (pain).    folic acid (FOLVITE) 1 MG tablet Take 1 tablet (1 mg total) by mouth daily.   losartan (COZAAR) 25 MG tablet Take 1 tablet (25 mg total) by mouth  daily.   meclizine (ANTIVERT) 25 MG tablet Take 1 tablet (25 mg total) by mouth 3 (three) times daily as needed for dizziness.   omeprazole (PRILOSEC) 20 MG capsule Take 20 mg by mouth daily.    tiZANidine (ZANAFLEX) 4 MG tablet Take 4 mg by mouth 2 (two) times daily.   topiramate (TOPAMAX) 100 MG tablet Take 1 tablet (100 mg total) by mouth 2 (two) times daily.   allopurinol (ZYLOPRIM) 100 MG tablet Take 1 tablet (100 mg total) by mouth daily.   donepezil (ARICEPT) 10 MG tablet TAKE 1 TABLET BY MOUTH AT BEDTIME (Patient not taking: Reported on 08/01/2022)   feeding supplement, GLUCERNA SHAKE, (GLUCERNA SHAKE) LIQD Take 237 mLs by mouth 3 (three) times daily between meals. (Patient not taking: Reported on 08/01/2022)   ferrous sulfate 325 (65 FE) MG tablet Take 1 tablet (325 mg total) by mouth daily with breakfast.   gabapentin (NEURONTIN) 300 MG capsule Take 300 mg by mouth 3 (three) times daily. (Patient not taking: Reported on 08/01/2022)   glimepiride (AMARYL) 4 MG tablet Take 4 mg by mouth daily with breakfast.  (Patient not taking: Reported on 08/01/2022)   No facility-administered encounter medications on file as of 08/01/2022.    Allergies as of 08/01/2022   (No Known Allergies)    Past Medical History:  Diagnosis Date   Arthritis  Diabetes mellitus without complication (HCC)    GERD (gastroesophageal reflux disease)    H/O hiatal hernia    Headache(784.0)    Hyperlipidemia    Hypertension    Neuromuscular disorder ([place])    neuropathy feet    Sleep apnea    recently diagnosed- wears cpap   Stroke ([place]) 06/2019    Past Surgical History:  Procedure Laterality Date   ABDOMINAL HYSTERECTOMY     partial   BACK SURGERY     BUBBLE STUDY  07/11/2019   Procedure: BUBBLE STUDY;  Surgeon: Dixie Dials, MD;  Location: [facility];  Service: Cardiovascular;;   CHOLECYSTECTOMY     COLONOSCOPY     age 72- doesnt know where or who    implantable loop recorder placement  10/26/2019    Medtronic Reveal [place] model [id number] S) implantable loop recorder implanted by Dr Rayann Heman for cryptogenic stroke   left shoulder     x 2 surgeries   right shoulder     x2   SHOULDER OPEN ROTATOR CUFF REPAIR Right 08/25/2012   Procedure: ROTATOR CUFF REPAIR SHOULDER OPEN WITH ACROMIOPLASTY;  Surgeon: Tobi Bastos, MD;  [facility];  Service: Orthopedics;  Laterality: Right;   TEE WITHOUT CARDIOVERSION N/A 07/11/2019   Procedure: TRANSESOPHAGEAL ECHOCARDIOGRAM (TEE);  Surgeon: Dixie Dials, MD;  Location[facility] END[facility]COPY;  Service: Cardiovascular;  Laterality: N/A;   TONSILLECTOMY      Family History  Problem Relation Age of Onset   Colon cancer Father    Colon cancer Paternal Grandfather    Colon cancer Maternal Grandfather    Bone cancer Maternal Uncle    Colon polyps Neg Hx    Esophageal cancer Neg Hx    Rectal cancer Neg Hx    Stomach cancer Neg Hx     Social History   Socioeconomic History   Marital status: Divorced    Spouse name: Not on file   Number of children: 3   Years of education: Not on file   Highest education level: Not on file  Occupational History   Occupation: retired    Fish farm manager: [employer]  Tobacco Use   Smoking status: Never   Smokeless tobacco: Never  Vaping Use   Vaping Use: Never used  Substance and Sexual Activity   Alcohol use: Yes    Alcohol/week: 0.0 standard drinks of alcohol    Comment: rarely   Drug use: No   Sexual activity: Not Currently  Other Topics Concern   Not on file  Social History Narrative   Lives alone   Right Handed   Drinks caffeine very seldomly   Social Determinants of Health   Financial Resource Strain: Not on file  Food Insecurity: Not on file  Transportation Needs: Not on file  Physical Activity: Not on file  Stress: Not on file  Social Connections: Not on file  Intimate Partner Violence: Not on file    Review of Systems  Constitutional:  Positive for fatigue.  Respiratory:  Positive  for shortness of breath.   Psychiatric/Behavioral:  Positive for sleep disturbance.     Vitals:   08/01/22 0954  BP: 122/82  Pulse: 91  SpO2: 98%   Physical Exam Constitutional:      Appearance: She is obese.  HENT:     Head: Normocephalic.     Nose: No congestion.     Mouth/Throat:     Mouth: Mucous membranes are moist.     Comments: Crowded oropharynx, macroglossia Eyes:  General: No scleral icterus.    Pupils: Pupils are equal, round, and reactive to light.  Cardiovascular:     Rate and Rhythm: Normal rate and regular rhythm.     Heart sounds: No murmur heard.    No friction rub.  Pulmonary:     Effort: No respiratory distress.     Breath sounds: No stridor. No wheezing or rhonchi.  Musculoskeletal:     Cervical back: No rigidity or tenderness.  Neurological:     Mental Status: She is alert.  Psychiatric:        Mood and Affect: Mood normal.       08/01/2022    9:00 AM 06/28/2021   10:00 AM  Results of the Epworth flowsheet  Sitting and reading 1 0  Watching TV 3 0  Sitting, inactive in a public place (e.g. a theatre or a meeting) 3 0  As a passenger in a car for an hour without a break 1 0  Lying down to rest in the afternoon when circumstances permit 3 3  Sitting and talking to someone 0 0  Sitting quietly after a lunch without alcohol 0 0  In a car, while stopped for a few minutes in traffic 0 0  Total score 11 3     Data Reviewed: She does not have a smart card in the machine for download Previous documented download reviewed from 2014-was on auto titrating CPAP  No recent chest x-ray or PFT on record  Assessment:  Obstructive sleep apnea -Has not been using CPAP as machine is dysfunctional  She does have excessive daytime sleepiness  Shortness of breath on exertion -Likely multifactorial -May be related to significant deconditioning  Obesity  Pathophysiology of sleep disordered breathing discussed with the patient Options moving  forward discussed with her  Plan/Recommendations:  DME referral for auto CPAP, settings of 5-15  Obtain a pulmonary function test  Chest x-ray today  Encouraged regarding graded exercises as tolerated  Encouraged to give [place] a call if any significant concerns  Sherrilyn Rist MD [facility] 08/01/2022, 10:13 AM  CC: Arthur Holms, NP

## 2022-08-01 NOTE — Patient Instructions (Addendum)
  DME referral for auto CPAP 5-15  Chest x-ray today  PFT at next visit  Follow-up in 3 months  Graded exercises as tolerated  Make sure you are exercising regularly  Call us with significant concerns

## 2022-08-04 ENCOUNTER — Telehealth: Payer: Self-pay | Admitting: Neurology

## 2022-08-04 NOTE — Telephone Encounter (Signed)
Pt is calling. Stated she needs a refill on her memory medication. Pt doesn't know the name of the medication. Stated she doesn't have the bottle anymore.

## 2022-08-15 ENCOUNTER — Ambulatory Visit: Payer: 59

## 2022-08-15 DIAGNOSIS — I639 Cerebral infarction, unspecified: Secondary | ICD-10-CM | POA: Diagnosis not present

## 2022-08-15 LAB — CUP PACEART REMOTE DEVICE CHECK
Date Time Interrogation Session: 20240229231857
Implantable Pulse Generator Implant Date: 20210512

## 2022-08-20 ENCOUNTER — Emergency Department (HOSPITAL_COMMUNITY): Payer: 59

## 2022-08-20 ENCOUNTER — Emergency Department (HOSPITAL_COMMUNITY)
Admission: EM | Admit: 2022-08-20 | Discharge: 2022-08-20 | Disposition: A | Discharge status: Home / Self Care | Payer: 59 | Attending: Emergency Medicine | Admitting: Emergency Medicine

## 2022-08-20 ENCOUNTER — Other Ambulatory Visit: Payer: Self-pay

## 2022-08-20 DIAGNOSIS — R410 Disorientation, unspecified: Secondary | ICD-10-CM | POA: Diagnosis present

## 2022-08-20 DIAGNOSIS — Z79899 Other long term (current) drug therapy: Secondary | ICD-10-CM | POA: Insufficient documentation

## 2022-08-20 DIAGNOSIS — T50905A Adverse effect of unspecified drugs, medicaments and biological substances, initial encounter: Secondary | ICD-10-CM

## 2022-08-20 DIAGNOSIS — I959 Hypotension, unspecified: Secondary | ICD-10-CM

## 2022-08-20 DIAGNOSIS — T391X5A Adverse effect of 4-Aminophenol derivatives, initial encounter: Secondary | ICD-10-CM | POA: Diagnosis not present

## 2022-08-20 DIAGNOSIS — Z7982 Long term (current) use of aspirin: Secondary | ICD-10-CM | POA: Diagnosis not present

## 2022-08-20 DIAGNOSIS — R4781 Slurred speech: Secondary | ICD-10-CM

## 2022-08-20 LAB — CBC WITH DIFFERENTIAL/PLATELET
Abs Immature Granulocytes: 0.01 10*3/uL (ref 0.00–0.07)
Basophils Absolute: 0.1 10*3/uL (ref 0.0–0.1)
Basophils Relative: 1 %
Eosinophils Absolute: 0.3 10*3/uL (ref 0.0–0.5)
Eosinophils Relative: 7 %
HCT: 32.4 % — ABNORMAL LOW (ref 36.0–46.0)
Hemoglobin: 10.5 g/dL — ABNORMAL LOW (ref 12.0–15.0)
Immature Granulocytes: 0 %
Lymphocytes Relative: 31 %
Lymphs Abs: 1.3 10*3/uL (ref 0.7–4.0)
MCH: 29.7 pg (ref 26.0–34.0)
MCHC: 32.4 g/dL (ref 30.0–36.0)
MCV: 91.8 fL (ref 80.0–100.0)
Monocytes Absolute: 0.4 10*3/uL (ref 0.1–1.0)
Monocytes Relative: 10 %
Neutro Abs: 2.2 10*3/uL (ref 1.7–7.7)
Neutrophils Relative %: 51 %
Platelets: 89 10*3/uL — ABNORMAL LOW (ref 150–400)
RBC: 3.53 MIL/uL — ABNORMAL LOW (ref 3.87–5.11)
RDW: 14.2 % (ref 11.5–15.5)
WBC: 4.3 10*3/uL (ref 4.0–10.5)
nRBC: 0 % (ref 0.0–0.2)

## 2022-08-20 LAB — COMPREHENSIVE METABOLIC PANEL
ALT: 14 U/L (ref 0–44)
AST: 13 U/L — ABNORMAL LOW (ref 15–41)
Albumin: 3 g/dL — ABNORMAL LOW (ref 3.5–5.0)
Alkaline Phosphatase: 51 U/L (ref 38–126)
Anion gap: 7 (ref 5–15)
BUN: 22 mg/dL (ref 8–23)
CO2: 20 mmol/L — ABNORMAL LOW (ref 22–32)
Calcium: 8.6 mg/dL — ABNORMAL LOW (ref 8.9–10.3)
Chloride: 110 mmol/L (ref 98–111)
Creatinine, Ser: 1.5 mg/dL — ABNORMAL HIGH (ref 0.44–1.00)
GFR, Estimated: 37 mL/min — ABNORMAL LOW (ref >60)
Glucose, Bld: 135 mg/dL — ABNORMAL HIGH (ref 70–99)
Potassium: 3.5 mmol/L (ref 3.5–5.1)
Sodium: 137 mmol/L (ref 135–145)
Total Bilirubin: 0.5 mg/dL (ref 0.3–1.2)
Total Protein: 5.8 g/dL — ABNORMAL LOW (ref 6.5–8.1)

## 2022-08-20 LAB — LACTIC ACID, PLASMA
Lactic Acid, Venous: 1.6 mmol/L (ref 0.5–1.9)
Lactic Acid, Venous: 1.8 mmol/L (ref 0.5–1.9)

## 2022-08-20 LAB — TROPONIN I (HIGH SENSITIVITY)
Troponin I (High Sensitivity): 5 ng/L (ref <18)
Troponin I (High Sensitivity): 7 ng/L (ref <18)

## 2022-08-20 LAB — APTT: aPTT: 31 seconds (ref 24–36)

## 2022-08-20 LAB — PROTIME-INR
INR: 1.1 (ref 0.8–1.2)
Prothrombin Time: 14.4 seconds (ref 11.4–15.2)

## 2022-08-20 MED ORDER — SODIUM CHLORIDE 0.9 % IV BOLUS
1000.0000 mL | Freq: Once | INTRAVENOUS | Status: AC
  Administered 2022-08-20: 1000 mL via INTRAVENOUS

## 2022-08-20 NOTE — Discharge Instructions (Addendum)
Your blood pressure was low initially.  Stop taking your Norvasc blood pressure medication for the next few days.  Follow-up with your primary care doctor to have that rechecked.  Return as needed for recurrent or worsening symptoms

## 2022-08-20 NOTE — ED Provider Notes (Signed)
Patient initially seen by Dr. Armandina Gemma.  Please see his note.  Patient presented for an episode of slurred speech.  Patient had taken Percocet in addition to her blood pressure medications.  Patient was initially noted to have low blood pressure and pinpoint pupils.  Patient states right now she feels fine.  She feels completely back to normal.  She denies any pain anywhere.  She is not feeling lightheaded or dizzy.  Her ED workup is reassuring.  She has anemia that appears stable compared to previous values.  Her metabolic panel is normal.  Her lactic acid level is normal.  Her troponin is normal.  Urinalysis was ordered however patient went to the bathroom and she was not aspirate sample.  She does not have any urinary symptoms.  Her chest x-ray and head CT are negative.  Possible patient may have had hypotension and transient change in her mental status related to her blood pressure medications and the Percocet.  She is completely back to normal at this time and is feels comfortable going home.  At this time I have low suspicion for acute sepsis or stroke TIA.  Will have her decrease her Norvasc medication.  Have her follow-up with her doctor closely to have her blood pressure rechecked.     Dorie Rank, MD 08/22/22 [phone]

## 2022-08-20 NOTE — ED Triage Notes (Signed)
Pt BIB [facility] from home due to family finding patient being very lethargic.  Pt could barely keep eyes open and had slurred speech.  Pt does have history of arthritis and stroke (left sided deficits).  Pt takes Plavix.  Family reports when she takes her blood pressure medicine along with her percocet she becomes hypotensive.  EMS noted pinpoint pupils and slow respirations.  [facility] administered 532m NS and '2mg'$  of Narcan intravenously.  20g left forearm.

## 2022-08-20 NOTE — ED Notes (Signed)
Patient transported to CT 

## 2022-08-20 NOTE — ED Provider Notes (Addendum)
[facility] Provider Note   CSN: [id number] Arrival date & time: 08/20/22  1422     History  Chief Complaint  Patient presents with   Drug Overdose    [name] is a 72 y.o. female.  HPI   72 year old female presenting to the emergency department with altered mental status and slurred speech.  The patient had taken a Percocet after her antihypertensives and EMS felt the patient had pinpoint pupils and slow respirations and so administered 2 mg of IV Narcan.  The patient was also hypotensive and received 500 cc of normal saline with EMS.  She states that she has had decreased oral intake over the past few days.  She does endorse a stroke with minimal left-sided deficits.  Denies any new focal deficits today.  On arrival, the patient had no slurred speech, was mildly somnolent but arousable, no facial droop, no weakness or numbness that was new.  She was hypotensive on arrival and was administered continued fluid resuscitation.  Denies any cough, chest pain, fever or chills.  No abdominal pain, nausea or vomiting.  Home Medications Prior to Admission medications   Medication Sig Start Date End Date Taking? Authorizing Provider  ACCU-CHEK AVIVA PLUS test strip USE BID FOR GLUCOSE TESTING 12/19/14   [provider]  albuterol (VENTOLIN HFA) 108 (90 Base) MCG/ACT inhaler 1 puff inhaled 1-4 times daily PRN for 30 days 02/17/17   [provider]  allopurinol (ZYLOPRIM) 100 MG tablet Take 1 tablet (100 mg total) by mouth daily. 07/12/19 06/28/21  Manuella Ghazi, Pratik D, DO  amLODipine (NORVASC) 10 MG tablet Take 10 mg by mouth daily. 04/15/19   [provider]  aspirin 81 MG chewable tablet Chew 1 tablet (81 mg total) by mouth daily. 09/20/19   Simmons-Robinson, Makiera, MD  atorvastatin (LIPITOR) 20 MG tablet Take 20 mg by mouth daily. 05/03/19   [provider]  cyclobenzaprine (FLEXERIL) 5 MG tablet Take 5 mg by  mouth at bedtime.  01/17/19   [provider]  diclofenac sodium (VOLTAREN) 1 % GEL Apply 2 g topically daily as needed (pain).  01/17/19   [provider]  donepezil (ARICEPT) 10 MG tablet TAKE 1 TABLET BY MOUTH AT BEDTIME Patient not taking: Reported on 08/01/2022 07/22/22   Garvin Fila, MD  feeding supplement, GLUCERNA SHAKE, (GLUCERNA SHAKE) LIQD Take 237 mLs by mouth 3 (three) times daily between meals. Patient not taking: Reported on 08/01/2022 09/19/19   Simmons-Robinson, Riki Sheer, MD  ferrous sulfate 325 (65 FE) MG tablet Take 1 tablet (325 mg total) by mouth daily with breakfast. 07/12/19 06/28/21  Manuella Ghazi, Pratik D, DO  folic acid (FOLVITE) 1 MG tablet Take 1 tablet (1 mg total) by mouth daily. 11/29/20   Garvin Fila, MD  gabapentin (NEURONTIN) 300 MG capsule Take 300 mg by mouth 3 (three) times daily. Patient not taking: Reported on 08/01/2022    [provider]  glimepiride (AMARYL) 4 MG tablet Take 4 mg by mouth daily with breakfast.  Patient not taking: Reported on 08/01/2022 11/23/18   [provider]  losartan (COZAAR) 25 MG tablet Take 1 tablet (25 mg total) by mouth daily. 10/23/15   Barton Dubois, MD  meclizine (ANTIVERT) 25 MG tablet Take 1 tablet (25 mg total) by mouth 3 (three) times daily as needed for dizziness. 09/28/21   Deno Etienne, DO  omeprazole (PRILOSEC) 20 MG capsule Take 20 mg by mouth daily.  10/05/14  [provider]  tiZANidine (ZANAFLEX) 4 MG tablet Take 4 mg by mouth 2 (two) times daily. 09/16/19   [provider]  topiramate (TOPAMAX) 100 MG tablet Take 1 tablet (100 mg total) by mouth 2 (two) times daily. 12/31/21   Garvin Fila, MD      Allergies    Patient has no known allergies.    Review of Systems   Review of Systems  All other systems reviewed and are negative.   Physical Exam Updated Vital Signs BP 97/60 (BP Location: Right Arm)   Pulse (!) 50   Temp 97.6 F (36.4 C) (Oral)   Resp 12   Ht '5\' 4"'$   (1.626 m)   Wt 71.2 kg   SpO2 97%   BMI 26.95 kg/m  Physical Exam Vitals and nursing note reviewed.  Constitutional:      General: She is not in acute distress.    Appearance: She is well-developed.  HENT:     Head: Normocephalic and atraumatic.  Eyes:     Conjunctiva/sclera: Conjunctivae normal.  Cardiovascular:     Rate and Rhythm: Regular rhythm. Bradycardia present.  Pulmonary:     Effort: Pulmonary effort is normal. No respiratory distress.     Breath sounds: Normal breath sounds.  Abdominal:     Palpations: Abdomen is soft.     Tenderness: There is no abdominal tenderness.  Musculoskeletal:        General: No swelling.     Cervical back: Neck supple.     Right lower leg: No edema.     Left lower leg: No edema.  Skin:    General: Skin is warm and dry.     Capillary Refill: Capillary refill takes less than 2 seconds.  Neurological:     Mental Status: She is alert.     Comments: MENTAL STATUS EXAM:    Orientation: Alert and oriented to person, place and time.  Memory: Cooperative, follows commands well.  Language: Speech is clear and language is normal.   CRANIAL NERVES:    CN 2 (Optic): Visual fields intact to confrontation.  CN 3,4,6 (EOM): Pupils equal and reactive to light. Full extraocular eye movement without nystagmus.  CN 5 (Trigeminal): Facial sensation is normal, no weakness of masticatory muscles.  CN 7 (Facial): No facial weakness or asymmetry.  CN 8 (Auditory): Auditory acuity grossly normal.  CN 9,10 (Glossophar): The uvula is midline, the palate elevates symmetrically.  CN 11 (spinal access): Normal sternocleidomastoid and trapezius strength.  CN 12 (Hypoglossal): The tongue is midline. No atrophy or fasciculations.Marland Kitchen   MOTOR:  Muscle Strength: 5/5RUE, 5/5LUE, 5/5RLE, 5/5LLE.   COORDINATION:   Intact finger-to-nose, no tremor.   SENSATION:   Intact to light touch all four extremities.     Psychiatric:        Mood and Affect: Mood normal.      ED Results / Procedures / Treatments   Labs (all labs ordered are listed, but only abnormal results are displayed) Labs Reviewed  CBC WITH DIFFERENTIAL/PLATELET - Abnormal; Notable for the following components:      Result Value   RBC 3.53 (*)    Hemoglobin 10.5 (*)    HCT 32.4 (*)    Platelets 89 (*)    All other components within normal limits  CULTURE, BLOOD (ROUTINE X 2)  CULTURE, BLOOD (ROUTINE X 2)  LACTIC ACID, PLASMA  PROTIME-INR  APTT  LACTIC ACID, PLASMA  URINALYSIS, W/ REFLEX TO CULTURE (INFECTION SUSPECTED)  COMPREHENSIVE  METABOLIC PANEL  TROPONIN I (HIGH SENSITIVITY)    EKG EKG Interpretation  Date/Time:  Wednesday August 20 2022 14:44:09 EST Ventricular Rate:  51 PR Interval:  238 QRS Duration: 77 QT Interval:  494 QTC Calculation: 455 R Axis:   41 Text Interpretation: Sinus bradycardia Prolonged PR interval Reconfirmed by Regan Lemming ([id number]) on 08/20/2022 4:28:54 PM  Radiology DG Chest Port 1 View  Result Date: 08/20/2022 CLINICAL DATA:  Sepsis EXAM: PORTABLE CHEST 1 VIEW COMPARISON:  08/01/2022 FINDINGS: Lung volumes are small there is mild left basilar atelectasis. The lungs are otherwise clear. No pneumothorax or pleural effusion. Cardiac size within normal limits. Pulmonary vascularity is normal. IMPRESSION: 1. Pulmonary hypoinflation. Electronically Signed   By: Fidela Salisbury M.D.   On: 08/20/2022 15:25    Procedures Procedures    Medications Ordered in ED Medications  sodium chloride 0.9 % bolus 1,000 mL (0 mLs Intravenous Stopped 08/20/22 1543)    ED Course/ Medical Decision Making/ A&P                             Medical Decision Making Amount and/or Complexity of Data Reviewed Labs: ordered. Radiology: ordered. ECG/medicine tests: ordered.     72 year old female presenting to the emergency department with altered mental status and slurred speech.  The patient had taken a Percocet after her antihypertensives and EMS felt the  patient had pinpoint pupils and slow respirations and so administered 2 mg of IV Narcan.  The patient was also hypotensive and received 500 cc of normal saline with EMS.  She states that she has had decreased oral intake over the past few days.  She does endorse a stroke with minimal left-sided deficits.  Denies any new focal deficits today.  On arrival, the patient had no slurred speech, was mildly somnolent but arousable, no facial droop, no weakness or numbness that was new.  She was hypotensive on arrival and was administered continued fluid resuscitation.  Denies any cough, chest pain, fever or chills.  No abdominal pain, nausea or vomiting.  On arrival, the patient was afebrile, bradycardic heart rate 54, RR 14, hypotensive BP 81/52, saturating 99% on room air.  Patient was GCS 14, neurologically intact, no concern for acute CVA.  Concern for confusion and slurred speech in the setting of hypertension and opiate administration.  She is status post Narcan with no decreased respirations, no pinpoint pupils at this time.  She was administered a 1 L IV fluid bolus on arrival with subsequent improvement in her blood pressure.  Septic workup initiated given the patient's hypotension and mild confusion, however pt not meeting SIRS criteria. Additional differential includes hypovolemia from poor p.o. intake.  Standard other toxic metabolic derangement sepsis electrolyte abnormality, hypoglycemia.  Initial EKG revealed sinus bradycardia, ventricular rate 5 1, no acute ischemic changes noted, prolonged PR interval, normal QTc.  Chest x-ray was performed revealed pulmonary hypoinflation, no focal consolidation to suggest pneumonia, no pneumothorax.  Laboratory evaluation to include CBC, troponin, lactic acid, INR, CMP, urinalysis, urine culture, blood cultures x 2 collected.   CBC without a leukocytosis, anemia present to 10.5, close to patient's previous old baseline.  Lactic acid was initially normal at 1.6.  Low  concern for shock.  Troponin initially negative at 5.  Laboratory testing pending at time of signout.  CT head ordered and pending at time of signout.  Plan at signout to follow-up remainder of labs and imaging, reassess the patient  following fluid resuscitation.   Final Clinical Impression(s) / ED Diagnoses Final diagnoses:  Hypotension, unspecified hypotension type  Confusion  Slurred speech  Adverse effect of drug, initial encounter    Rx / DC Orders ED Discharge Orders     None         Regan Lemming, MD 08/20/22 1630    Regan Lemming, MD 08/20/22 [phone]

## 2022-08-23 ENCOUNTER — Emergency Department (HOSPITAL_COMMUNITY)
Admission: EM | Admit: 2022-08-23 | Discharge: 2022-08-23 | Disposition: A | Discharge status: Home / Self Care | Payer: 59 | Attending: Emergency Medicine | Admitting: Emergency Medicine

## 2022-08-23 ENCOUNTER — Emergency Department (HOSPITAL_COMMUNITY): Payer: 59

## 2022-08-23 ENCOUNTER — Other Ambulatory Visit: Payer: Self-pay

## 2022-08-23 ENCOUNTER — Encounter (HOSPITAL_COMMUNITY): Payer: Self-pay | Admitting: Emergency Medicine

## 2022-08-23 DIAGNOSIS — R531 Weakness: Secondary | ICD-10-CM | POA: Insufficient documentation

## 2022-08-23 DIAGNOSIS — Z7982 Long term (current) use of aspirin: Secondary | ICD-10-CM | POA: Diagnosis not present

## 2022-08-23 DIAGNOSIS — Z8673 Personal history of transient ischemic attack (TIA), and cerebral infarction without residual deficits: Secondary | ICD-10-CM | POA: Insufficient documentation

## 2022-08-23 DIAGNOSIS — R42 Dizziness and giddiness: Secondary | ICD-10-CM | POA: Insufficient documentation

## 2022-08-23 DIAGNOSIS — I10 Essential (primary) hypertension: Secondary | ICD-10-CM | POA: Diagnosis not present

## 2022-08-23 DIAGNOSIS — Z79899 Other long term (current) drug therapy: Secondary | ICD-10-CM | POA: Diagnosis not present

## 2022-08-23 DIAGNOSIS — Z1152 Encounter for screening for COVID-19: Secondary | ICD-10-CM | POA: Diagnosis not present

## 2022-08-23 LAB — TROPONIN I (HIGH SENSITIVITY)
Troponin I (High Sensitivity): 4 ng/L (ref <18)
Troponin I (High Sensitivity): 5 ng/L (ref <18)

## 2022-08-23 LAB — COMPREHENSIVE METABOLIC PANEL
ALT: 14 U/L (ref 0–44)
AST: 13 U/L — ABNORMAL LOW (ref 15–41)
Albumin: 3 g/dL — ABNORMAL LOW (ref 3.5–5.0)
Alkaline Phosphatase: 49 U/L (ref 38–126)
Anion gap: 4 — ABNORMAL LOW (ref 5–15)
BUN: 20 mg/dL (ref 8–23)
CO2: 20 mmol/L — ABNORMAL LOW (ref 22–32)
Calcium: 8.3 mg/dL — ABNORMAL LOW (ref 8.9–10.3)
Chloride: 112 mmol/L — ABNORMAL HIGH (ref 98–111)
Creatinine, Ser: 1.26 mg/dL — ABNORMAL HIGH (ref 0.44–1.00)
GFR, Estimated: 46 mL/min — ABNORMAL LOW (ref >60)
Glucose, Bld: 193 mg/dL — ABNORMAL HIGH (ref 70–99)
Potassium: 3.9 mmol/L (ref 3.5–5.1)
Sodium: 136 mmol/L (ref 135–145)
Total Bilirubin: 0.6 mg/dL (ref 0.3–1.2)
Total Protein: 5.7 g/dL — ABNORMAL LOW (ref 6.5–8.1)

## 2022-08-23 LAB — CBC WITH DIFFERENTIAL/PLATELET
Abs Immature Granulocytes: 0 10*3/uL (ref 0.00–0.07)
Basophils Absolute: 0.1 10*3/uL (ref 0.0–0.1)
Basophils Relative: 1 %
Eosinophils Absolute: 0.2 10*3/uL (ref 0.0–0.5)
Eosinophils Relative: 5 %
HCT: 34.7 % — ABNORMAL LOW (ref 36.0–46.0)
Hemoglobin: 10.9 g/dL — ABNORMAL LOW (ref 12.0–15.0)
Immature Granulocytes: 0 %
Lymphocytes Relative: 27 %
Lymphs Abs: 1.1 10*3/uL (ref 0.7–4.0)
MCH: 29.3 pg (ref 26.0–34.0)
MCHC: 31.4 g/dL (ref 30.0–36.0)
MCV: 93.3 fL (ref 80.0–100.0)
Monocytes Absolute: 0.4 10*3/uL (ref 0.1–1.0)
Monocytes Relative: 9 %
Neutro Abs: 2.2 10*3/uL (ref 1.7–7.7)
Neutrophils Relative %: 58 %
Platelets: 108 10*3/uL — ABNORMAL LOW (ref 150–400)
RBC: 3.72 MIL/uL — ABNORMAL LOW (ref 3.87–5.11)
RDW: 13.9 % (ref 11.5–15.5)
WBC: 3.9 10*3/uL — ABNORMAL LOW (ref 4.0–10.5)
nRBC: 0 % (ref 0.0–0.2)

## 2022-08-23 LAB — LACTIC ACID, PLASMA: Lactic Acid, Venous: 1.5 mmol/L (ref 0.5–1.9)

## 2022-08-23 LAB — RESP PANEL BY RT-PCR (RSV, FLU A&B, COVID)  RVPGX2
Influenza A by PCR: NEGATIVE
Influenza B by PCR: NEGATIVE
Resp Syncytial Virus by PCR: NEGATIVE
SARS Coronavirus 2 by RT PCR: NEGATIVE

## 2022-08-23 MED ORDER — ACETAMINOPHEN 325 MG PO TABS
650.0000 mg | ORAL_TABLET | Freq: Once | ORAL | Status: AC
  Administered 2022-08-23: 650 mg via ORAL
  Filled 2022-08-23: qty 2

## 2022-08-23 MED ORDER — SODIUM CHLORIDE 0.9 % IV BOLUS
1000.0000 mL | Freq: Once | INTRAVENOUS | Status: AC
  Administered 2022-08-23: 1000 mL via INTRAVENOUS

## 2022-08-23 NOTE — Discharge Instructions (Addendum)
You were seen in the emergency department for dizziness.  Your lab work was unremarkable and your blood pressure improved with some fluids.  Please continue to hold your amlodipine and if your blood pressure remains low will need to hold your losartan.  Keep well-hydrated.  Follow-up with your regular doctor.  Return to the emergency department if any worsening or concerning symptoms.

## 2022-08-23 NOTE — ED Provider Notes (Signed)
[facility] Provider Note   CSN: [id number] Arrival date & time: 08/23/22  1246     History {Add pertinent medical, surgical, social history, OB history to HPI:1} Chief Complaint  Patient presents with   Dizziness   Weakness    [name] is a 72 y.o. female.  She is presenting for by ambulance from home after feeling weak and dizzy this morning.  Has prior history of stroke hypertension.  Was seen here 3 days ago for altered mental status slurred speech.  They attributed it to low blood pressure or possibly overmedication and possibly related to taking a pain medication.  Her workup was otherwise unremarkable and she has not taken her Norvasc since discharge.  She said yesterday she was fine and today every time she stood up she felt dizzy lightheaded like she was going to fall down.  She denies any strokelike symptoms, no blurry vision focal weakness also no chest pain shortness of breath nausea vomiting diarrhea or urinary symptoms.  No fevers or chills.  She said at baseline she does not eat very well and does not drink much fluids.  She denies having taken any Percocet today.  The history is provided by the patient.  Dizziness Quality:  Lightheadedness Severity:  Moderate Timing:  Intermittent Progression:  Unchanged Chronicity:  Recurrent Context: standing up   Relieved by:  None tried Worsened by:  Standing up Ineffective treatments:  None tried Associated symptoms: no chest pain, no headaches and no shortness of breath   Risk factors: hx of stroke        Home Medications Prior to Admission medications   Medication Sig Start Date End Date Taking? Authorizing Provider  ACCU-CHEK AVIVA PLUS test strip USE BID FOR GLUCOSE TESTING 12/19/14   [provider]  albuterol (VENTOLIN HFA) 108 (90 Base) MCG/ACT inhaler 1 puff inhaled 1-4 times daily PRN for 30 days 02/17/17   [provider]  allopurinol  (ZYLOPRIM) 100 MG tablet Take 1 tablet (100 mg total) by mouth daily. 07/12/19 06/28/21  Manuella Ghazi, Pratik D, DO  amLODipine (NORVASC) 10 MG tablet Take 10 mg by mouth daily. 04/15/19   [provider]  aspirin 81 MG chewable tablet Chew 1 tablet (81 mg total) by mouth daily. 09/20/19   Simmons-Robinson, Makiera, MD  atorvastatin (LIPITOR) 20 MG tablet Take 20 mg by mouth daily. 05/03/19   [provider]  cyclobenzaprine (FLEXERIL) 5 MG tablet Take 5 mg by mouth at bedtime.  01/17/19   [provider]  diclofenac sodium (VOLTAREN) 1 % GEL Apply 2 g topically daily as needed (pain).  01/17/19   [provider]  donepezil (ARICEPT) 10 MG tablet TAKE 1 TABLET BY MOUTH AT BEDTIME Patient not taking: Reported on 08/01/2022 07/22/22   Garvin Fila, MD  feeding supplement, GLUCERNA SHAKE, (GLUCERNA SHAKE) LIQD Take 237 mLs by mouth 3 (three) times daily between meals. Patient not taking: Reported on 08/01/2022 09/19/19   Simmons-Robinson, Riki Sheer, MD  ferrous sulfate 325 (65 FE) MG tablet Take 1 tablet (325 mg total) by mouth daily with breakfast. 07/12/19 06/28/21  Manuella Ghazi, Pratik D, DO  folic acid (FOLVITE) 1 MG tablet Take 1 tablet (1 mg total) by mouth daily. 11/29/20   Garvin Fila, MD  gabapentin (NEURONTIN) 300 MG capsule Take 300 mg by mouth 3 (three) times daily. Patient not taking: Reported on 08/01/2022    [provider]  glimepiride (AMARYL) 4 MG tablet Take  4 mg by mouth daily with breakfast.  Patient not taking: Reported on 08/01/2022 11/23/18   [provider]  losartan (COZAAR) 25 MG tablet Take 1 tablet (25 mg total) by mouth daily. 10/23/15   Barton Dubois, MD  meclizine (ANTIVERT) 25 MG tablet Take 1 tablet (25 mg total) by mouth 3 (three) times daily as needed for dizziness. 09/28/21   Deno Etienne, DO  omeprazole (PRILOSEC) 20 MG capsule Take 20 mg by mouth daily.  10/05/14   [provider]  tiZANidine (ZANAFLEX) 4 MG tablet Take 4 mg by mouth 2  (two) times daily. 09/16/19   [provider]  topiramate (TOPAMAX) 100 MG tablet Take 1 tablet (100 mg total) by mouth 2 (two) times daily. 12/31/21   Garvin Fila, MD      Allergies    Patient has no known allergies.    Review of Systems   Review of Systems  Constitutional:  Negative for fever.  HENT:  Negative for sore throat.   Eyes:  Negative for visual disturbance.  Respiratory:  Negative for shortness of breath.   Cardiovascular:  Negative for chest pain.  Gastrointestinal:  Negative for abdominal pain.  Genitourinary:  Negative for dysuria.  Skin:  Negative for rash.  Neurological:  Positive for dizziness and light-headedness. Negative for speech difficulty and headaches.    Physical Exam Updated Vital Signs There were no vitals taken for this visit. Physical Exam Vitals and nursing note reviewed.  Constitutional:      General: She is not in acute distress.    Appearance: Normal appearance. She is well-developed.  HENT:     Head: Normocephalic and atraumatic.  Eyes:     Conjunctiva/sclera: Conjunctivae normal.  Cardiovascular:     Rate and Rhythm: Normal rate and regular rhythm.     Heart sounds: No murmur heard. Pulmonary:     Effort: Pulmonary effort is normal. No respiratory distress.     Breath sounds: Normal breath sounds.  Abdominal:     Palpations: Abdomen is soft.     Tenderness: There is no abdominal tenderness. There is no guarding or rebound.  Musculoskeletal:        General: No swelling.     Cervical back: Neck supple.  Skin:    General: Skin is warm and dry.     Capillary Refill: Capillary refill takes less than 2 seconds.  Neurological:     General: No focal deficit present.     Mental Status: She is alert and oriented to person, place, and time.     Cranial Nerves: No cranial nerve deficit.     Sensory: No sensory deficit.     Motor: No weakness.     ED Results / Procedures / Treatments   Labs (all labs ordered are listed, but  only abnormal results are displayed) Labs Reviewed  RESP PANEL BY RT-PCR (RSV, FLU A&B, COVID)  [id number]  COMPREHENSIVE METABOLIC PANEL  CBC WITH DIFFERENTIAL/PLATELET  LACTIC ACID, PLASMA  LACTIC ACID, PLASMA  URINALYSIS, ROUTINE W REFLEX MICROSCOPIC  TROPONIN I (HIGH SENSITIVITY)    EKG None  Radiology No results found.  Procedures Procedures  {Document cardiac monitor, telemetry assessment procedure when appropriate:1}  Medications Ordered in ED Medications  sodium chloride 0.9 % bolus 1,000 mL (has no administration in time range)    ED Course/ Medical Decision Making/ A&P   {   Click here for ABCD2, HEART and other calculatorsREFRESH Note before signing :1}  Medical Decision Making Amount and/or Complexity of Data Reviewed Labs: ordered.   This patient complains of ***; this involves an extensive number of treatment Options and is a complaint that carries with it a high risk of complications and morbidity. The differential includes ***  I ordered, reviewed and interpreted labs, which included *** I ordered medication *** and reviewed PMP when indicated. I ordered imaging studies which included *** and I independently    visualized and interpreted imaging which showed *** Additional history obtained from *** Previous records obtained and reviewed *** I consulted *** and discussed lab and imaging findings and discussed disposition.  Cardiac monitoring reviewed, *** Social determinants considered, *** Critical Interventions: ***  After the interventions stated above, I reevaluated the patient and found *** Admission and further testing considered, ***   {Document critical care time when appropriate:1} {Document review of labs and clinical decision tools ie heart score, Chads2Vasc2 etc:1}  {Document your independent review of radiology images, and any outside records:1} {Document your discussion with family members, caretakers, and with  consultants:1} {Document social determinants of health affecting pt's care:1} {Document your decision making why or why not admission, treatments were needed:1} Final Clinical Impression(s) / ED Diagnoses Final diagnoses:  None    Rx / DC Orders ED Discharge Orders     None

## 2022-08-23 NOTE — ED Triage Notes (Signed)
Pt came via EMS from home and is complaining of dizziness and weakness. Her BP was 80 systolic when she moved to get to the stretcher.  HR 60 O2 97 CBG 176 BP 114/70

## 2022-08-25 LAB — CULTURE, BLOOD (ROUTINE X 2)
Culture: NO GROWTH
Culture: NO GROWTH

## 2022-08-28 NOTE — Progress Notes (Signed)
Carelink Summary Report / Loop Recorder 

## 2022-09-12 NOTE — Progress Notes (Signed)
Carelink Summary Report / Loop Recorder 

## 2022-09-17 ENCOUNTER — Ambulatory Visit (INDEPENDENT_AMBULATORY_CARE_PROVIDER_SITE_OTHER): Payer: 59

## 2022-09-17 DIAGNOSIS — I639 Cerebral infarction, unspecified: Secondary | ICD-10-CM

## 2022-09-17 LAB — CUP PACEART REMOTE DEVICE CHECK
Date Time Interrogation Session: 20240403002228
Implantable Pulse Generator Implant Date: 20210512

## 2022-09-29 ENCOUNTER — Ambulatory Visit (INDEPENDENT_AMBULATORY_CARE_PROVIDER_SITE_OTHER): Payer: 59 | Admitting: Neurology

## 2022-09-29 VITALS — BP 80/45 | HR 66 | Ht 64.0 in | Wt 160.1 lb

## 2022-09-29 DIAGNOSIS — G629 Polyneuropathy, unspecified: Secondary | ICD-10-CM | POA: Diagnosis not present

## 2022-09-29 DIAGNOSIS — R202 Paresthesia of skin: Secondary | ICD-10-CM

## 2022-09-29 DIAGNOSIS — G3184 Mild cognitive impairment, so stated: Secondary | ICD-10-CM | POA: Diagnosis not present

## 2022-09-29 MED ORDER — TOPIRAMATE 100 MG PO TABS: 150.0000 mg | ORAL_TABLET | Freq: Two times a day (BID) | ORAL | 11 refills | Status: DC

## 2022-09-29 NOTE — Progress Notes (Signed)
[facility] [address] [place]. [place] [address] [phone]       OFFICE FOLLOW UP VISIT NOTE  [name] Date of Birth:  1950-09-04 Medical Record Number:  [id number]   Referring MD: [name] Reason for Referral: Memory loss HPI: Initial visit 09/22/2019:[name] is a 72 year old pleasant African-American lady seen today for initial office consultation visit for memory loss.  History is obtained from the patient and her granddaughter [name] who is accompanying her.  I personally reviewed referral notes, electronic medical records and pertinent imaging films in PACS.  She has past medical history of diabetes, hypertension, hyperlipidemia, sleep apnea and arthritis.  She has been having memory difficulties for over a year.  She forgets her appointments as well as her medications.  She often gets disoriented.  She has good days and bad days.  She lives alone but her granddaughter keeps a close eye on her and helps her.  She is off later needed more help.  She has not been driving.  She can cook somewhat but grandchildren have been helping.  She needs supervision for a bath.  She has not had any hallucinations, delusions, violence or unsafe behavior.  She does get irritable and upset easily.  She denies any headaches, gait difficulties, double vision, focal extremity weakness numbness.  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  Loop recorder insertion has not shown paroxysmal A. fib yet.  She remains on aspirin for stroke prevention which is tolerating well without bruising or bleeding.  Her blood pressures well controlled today it is 136/73.  She cannot tell me when last lipid profile was checked or what her last hemoglobin A1c was.  There is no family Struve Alzheimer's or dementia. Update 11/29/2020: She returns for follow-up after last  visit 2 months ago.  She continues to have memory difficulties but she states this may be getting better.  She has been perspiring and doing memory challenging activities.  She continues to live alone.  Her grandson helps her and checks on her daily.  She is still able to do most actives of daily living for herself.  She denies any delusions, hallucinations, agitation or unsafe behavior.  She had lab work done at last visit on 09/17/2020 which showed normal vitamin B12, TSH.  RPR was negative.  Homocystine was elevated at 20.9.  I had advised her to start taking folic acid but she has not done that yet.  LDL cholesterol was 58 mg percent and hemoglobin A1c was 6.9.  EEG done on 10/16/2020 showed mild generalized slowing which is nonspecific.  She has not been tried yet on medication like Aricept or Namenda but is willing to try. Update 08/20/2021 : Patient is referred back today by her nurse practitioner [name] for evaluation for bilateral foot and right hand paresthesias.  Patient states this has been going on for several years.  She does have a history of a lumbar spinal stenosis and had a previous MRI done few years ago which had shown mild spinal stenosis.  However she denies any significant back pain, radicular pain, symptoms of neurogenic claudication.  She has had no recent back injury or fall.  She complains of mostly tingling and numbness in her feet bilaterally right more than left and occasionally in the right hand as well.  No specific triggers.  She has been started  on gabapentin 303 times daily which she seems to be tolerating well but feels is not particularly helping.  She denies any difficulty with walking balance falls or injuries.  She does have diabetes but feels it is under good control and last hemoglobin A1c checked in January this year was satisfactory as per the patient but I do not have the results to share.  Patient was started by me on Aricept at last visit for cognitive impairment and seems to  be doing well on that.  She likes it as not having any side effects and she feels her memory is much improved and she is more independent in activities of daily living and can manage more by herself.  She still lives alone.  She denies any delusion, hallucinations, unsafe behavior.  She denies any nausea, vomiting, dizziness.  She denies any recurrent symptoms of strokes or TIAs.  She remains on aspirin which is tolerating well without bleeding or bruising.  She states her blood pressures under good control today it is 122/71.  She is tolerating her statin as well as diabetes medicines well. Update 09/29/2022 : She returns for follow-up after last visit a year ago.  Patient continues to have significant paresthesia of the [place] still bothersome.  She is on Topamax 100 mg twice daily which is tolerating well without any alterations in taste, weight loss or GI side effects.  She denies any significant trouble with gait balance is likely.  She continues to have mild short-term memory difficulties and remains on Aricept 10 mg daily which is tolerating well with no side effects.  Due to memory difficulties more or less unchanged.  He is tolerating aspirin well without bleeding or bruising.  She has had no recurrent stroke or TIA symptoms.  She states her blood pressure control is good today it is actually quite low at 80/45.  She states that her blood sugars  control is also quite good.  Mini-Mental status testing was not done today but she scored 3/3 on recall, 3/4 on clock drawing able to name 4 legs ROS:   14 system review of systems is positive for memory loss, forgetfulness, disorientation, numbness, tingling and all other systems negative PMH:  Past Medical History:  Diagnosis Date   Arthritis    Diabetes mellitus without complication (HCC)    GERD (gastroesophageal reflux disease)    H/O hiatal hernia    Headache(784.0)    Hyperlipidemia    Hypertension    Neuromuscular disorder (HCC)     neuropathy feet    Sleep apnea    recently diagnosed- wears cpap   Stroke (HCC) 06/2019    Social History:  Social History   Socioeconomic History   Marital status: Divorced    Spouse name: Not on file   Number of children: 3   Years of education: Not on file   Highest education level: Not on file  Occupational History   Occupation: retired    Associate Professor: [employer]  Tobacco Use   Smoking status: Never   Smokeless tobacco: Never  Vaping Use   Vaping Use: Never used  Substance and Sexual Activity   Alcohol use: Yes    Alcohol/week: 0.0 standard drinks of alcohol    Comment: rarely   Drug use: No   Sexual activity: Not Currently  Other Topics Concern   Not on file  Social History Narrative   Lives alone   Right Handed   Drinks caffeine very seldomly   Social Determinants  of Health   Financial Resource Strain: Not on file  Food Insecurity: Not on file  Transportation Needs: Not on file  Physical Activity: Not on file  Stress: Not on file  Social Connections: Not on file  Intimate Partner Violence: Not on file    Medications:   Current Outpatient Medications on File Prior to Visit  Medication Sig Dispense Refill   ACCU-CHEK AVIVA PLUS test strip USE BID FOR GLUCOSE TESTING  11   albuterol (VENTOLIN HFA) 108 (90 Base) MCG/ACT inhaler 1 puff inhaled 1-4 times daily PRN for 30 days     amLODipine (NORVASC) 10 MG tablet Take 10 mg by mouth daily.     aspirin 81 MG chewable tablet Chew 1 tablet (81 mg total) by mouth daily. 30 tablet 0   atorvastatin (LIPITOR) 20 MG tablet Take 20 mg by mouth daily.     cyclobenzaprine (FLEXERIL) 5 MG tablet Take 5 mg by mouth at bedtime.      diclofenac sodium (VOLTAREN) 1 % GEL Apply 2 g topically daily as needed (pain).      folic acid (FOLVITE) 1 MG tablet Take 1 tablet (1 mg total) by mouth daily. 90 tablet 1   gabapentin (NEURONTIN) 300 MG capsule Take 300 mg by mouth 3 (three) times daily.     losartan (COZAAR) 25 MG tablet  Take 1 tablet (25 mg total) by mouth daily. 30 tablet 1   meclizine (ANTIVERT) 25 MG tablet Take 1 tablet (25 mg total) by mouth 3 (three) times daily as needed for dizziness. 30 tablet 0   omeprazole (PRILOSEC) 20 MG capsule Take 20 mg by mouth daily.      tiZANidine (ZANAFLEX) 4 MG tablet Take 4 mg by mouth 2 (two) times daily.     topiramate (TOPAMAX) 100 MG tablet Take 1 tablet (100 mg total) by mouth 2 (two) times daily. 60 tablet 5   allopurinol (ZYLOPRIM) 100 MG tablet Take 1 tablet (100 mg total) by mouth daily. 30 tablet 0   donepezil (ARICEPT) 10 MG tablet TAKE 1 TABLET BY MOUTH AT BEDTIME (Patient not taking: Reported on 08/01/2022) 30 tablet 5   feeding supplement, GLUCERNA SHAKE, (GLUCERNA SHAKE) LIQD Take 237 mLs by mouth 3 (three) times daily between meals. (Patient not taking: Reported on 08/01/2022) 21330 mL 1   ferrous sulfate 325 (65 FE) MG tablet Take 1 tablet (325 mg total) by mouth daily with breakfast. 30 tablet 0   glimepiride (AMARYL) 4 MG tablet Take 4 mg by mouth daily with breakfast.  (Patient not taking: Reported on 08/01/2022)     No current facility-administered medications on file prior to visit.    Allergies:  No Known Allergies  Physical Exam General: Pleasant middle-aged African-American lady, seated, in no evident distress Head: head normocephalic and atraumatic.   Neck: supple with no carotid or supraclavicular bruits Cardiovascular: regular rate and rhythm, no murmurs Musculoskeletal: no deformity Skin:  no rash/petichiae Vascular:  Normal pulses all extremities  Neurologic Exam Mental Status: Awake and fully alert. Oriented to place and time. Recent and remote memory diminished. Attention span, concentration and fund of knowledge appropriate. Mood and affect appropriate.  Mini-Mental status exam not done today.  (Last visit score 25/30 .)  Intact recall 3/3.Marland Kitchen  Able to name only 9 animals which can walk on 4 legs.  Clock drawing 3/4.   Cranial Nerves:  Fundoscopic exam  not done. Pupils equal, briskly reactive to light. Extraocular movements full without nystagmus. Visual fields full  to confrontation. Hearing intact. Facial sensation intact. Face, tongue, palate moves normally and symmetrically.  Motor: Normal bulk and tone. Normal strength in all tested extremity muscles. Sensory.:  Hyperesthesia to touch and pinprick from ankle down bilaterally.  Position sense is normal.  Impaired vibration over toes bilaterally.  Negative Romberg sign Coordination: Rapid alternating movements normal in all extremities. Finger-to-nose and heel-to-shin performed accurately bilaterally. Gait and Station: Arises from chair without difficulty. Stance is normal. Gait demonstrates normal stride length and balance . Able to heel, toe and tandem walk without difficulty.  Reflexes: 1+ and symmetric. Toes downgoing.        08/20/2021    8:21 AM 11/29/2020   11:47 AM 09/17/2020   10:47 AM  MMSE - Mini Mental State Exam  Orientation to time Orientation to Place Registration Attention/ Calculation Recall Language- name 2 objects Language- repeat 1 0 0  Language- follow 3 step command Language- read & follow direction Write a sentence Copy design 0 1 1  Total score ASSESSMENT: 72 year old lady with subacute progressive memory and cognitive worsening over the last 1 year likely due to mild cognitive impairment.  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  Vascular risk factors of diabetes, hyperlipidemia, hypertension, mild hyper homocystinemia and obesity.  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       PLAN: I had a long discussion with the patient regarding her new complaints of bilateral foot and right hand  paresthesias which likely are due to small fiber neuropathy from her diabetes.  I recommend she increase the dose of Topamax to 150 mg twice daily to help with paresthesias.  Continue gabapentin  in the current dose of 300 mg thrice daily.  Strict control of diabetes with hemoglobin A1c goal below 6.5%.  Continue Aricept and the current dose of 10 mg daily for her memory loss and cognitive impairment which appears stable.  She was also encouraged to increase participation in cognitively challenging activities like solving crossword puzzles, playing bridge and sudoku.  Continue aspirin for stroke prevention with aggressive risk factor modification with strict control of hypertension with blood pressure goal below 130/90, lipids with LDL cholesterol goal below 70 mg percent and diabetes with hemoglobin A1c goal below 6.5% return for follow-up in the future in 6 months with nurse practitioner or call earlier if necessaryshe will return for follow-up in the future in 3 months or call earlier if necessary.Greater than 50% time during this 35-minute  visit was spent in counseling and coordination of care about her paresthesias and likely underlying diabetic neuropathy and lumbar spinal stenosis memory loss and mild cognitive impairment and answering questions. Delia Heady, MD Note: This document was prepared with digital dictation and possible smart phrase technology. Any transcriptional errors that result from this process are unintentional.

## 2022-09-29 NOTE — Patient Instructions (Signed)
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  I recommend she increase the dose of Topamax to 150 mg twice daily to help with paresthesias.  Continue gabapentin  in the current dose of 300 mg thrice daily.  Strict control of diabetes with hemoglobin A1c goal below 6.5%.  Continue Aricept and the current dose of 10 mg daily for her memory loss and cognitive impairment which appears stable.  She was also encouraged to increase participation in cognitively challenging activities like solving crossword puzzles, playing bridge and sudoku.  Continue aspirin for stroke prevention with aggressive risk factor modification with strict control of hypertension with blood pressure goal below 130/90, lipids with LDL cholesterol goal below 70 mg percent and diabetes with hemoglobin A1c goal below 6.5% return for follow-up in the future in 6 months with nurse practitioner or call earlier if necessary

## 2022-10-08 ENCOUNTER — Emergency Department (HOSPITAL_COMMUNITY)
Admission: EM | Admit: 2022-10-08 | Discharge: 2022-10-08 | Disposition: A | Discharge status: Home / Self Care | Payer: 59 | Attending: Emergency Medicine | Admitting: Emergency Medicine

## 2022-10-08 ENCOUNTER — Other Ambulatory Visit: Payer: Self-pay

## 2022-10-08 ENCOUNTER — Emergency Department (HOSPITAL_COMMUNITY): Payer: 59

## 2022-10-08 ENCOUNTER — Encounter (HOSPITAL_COMMUNITY): Payer: Self-pay

## 2022-10-08 DIAGNOSIS — E119 Type 2 diabetes mellitus without complications: Secondary | ICD-10-CM | POA: Diagnosis not present

## 2022-10-08 DIAGNOSIS — Z79899 Other long term (current) drug therapy: Secondary | ICD-10-CM | POA: Diagnosis not present

## 2022-10-08 DIAGNOSIS — R001 Bradycardia, unspecified: Secondary | ICD-10-CM | POA: Diagnosis not present

## 2022-10-08 DIAGNOSIS — Z8673 Personal history of transient ischemic attack (TIA), and cerebral infarction without residual deficits: Secondary | ICD-10-CM | POA: Diagnosis not present

## 2022-10-08 DIAGNOSIS — T50905A Adverse effect of unspecified drugs, medicaments and biological substances, initial encounter: Secondary | ICD-10-CM | POA: Diagnosis not present

## 2022-10-08 DIAGNOSIS — R4182 Altered mental status, unspecified: Secondary | ICD-10-CM | POA: Diagnosis present

## 2022-10-08 DIAGNOSIS — I10 Essential (primary) hypertension: Secondary | ICD-10-CM | POA: Diagnosis not present

## 2022-10-08 LAB — CBC WITH DIFFERENTIAL/PLATELET
Abs Immature Granulocytes: 0.02 10*3/uL (ref 0.00–0.07)
Basophils Absolute: 0 10*3/uL (ref 0.0–0.1)
Basophils Relative: 1 %
Eosinophils Absolute: 0.3 10*3/uL (ref 0.0–0.5)
Eosinophils Relative: 8 %
HCT: 35.1 % — ABNORMAL LOW (ref 36.0–46.0)
Hemoglobin: 10.9 g/dL — ABNORMAL LOW (ref 12.0–15.0)
Immature Granulocytes: 1 %
Lymphocytes Relative: 30 %
Lymphs Abs: 0.9 10*3/uL (ref 0.7–4.0)
MCH: 30.2 pg (ref 26.0–34.0)
MCHC: 31.1 g/dL (ref 30.0–36.0)
MCV: 97.2 fL (ref 80.0–100.0)
Monocytes Absolute: 0.3 10*3/uL (ref 0.1–1.0)
Monocytes Relative: 11 %
Neutro Abs: 1.5 10*3/uL — ABNORMAL LOW (ref 1.7–7.7)
Neutrophils Relative %: 49 %
Platelets: 72 10*3/uL — ABNORMAL LOW (ref 150–400)
RBC: 3.61 MIL/uL — ABNORMAL LOW (ref 3.87–5.11)
RDW: 15.1 % (ref 11.5–15.5)
WBC: 3.1 10*3/uL — ABNORMAL LOW (ref 4.0–10.5)
nRBC: 0 % (ref 0.0–0.2)

## 2022-10-08 LAB — COMPREHENSIVE METABOLIC PANEL
ALT: 24 U/L (ref 0–44)
AST: 22 U/L (ref 15–41)
Albumin: 2.8 g/dL — ABNORMAL LOW (ref 3.5–5.0)
Alkaline Phosphatase: 49 U/L (ref 38–126)
Anion gap: 9 (ref 5–15)
BUN: 28 mg/dL — ABNORMAL HIGH (ref 8–23)
CO2: 19 mmol/L — ABNORMAL LOW (ref 22–32)
Calcium: 8.9 mg/dL (ref 8.9–10.3)
Chloride: 109 mmol/L (ref 98–111)
Creatinine, Ser: 1.48 mg/dL — ABNORMAL HIGH (ref 0.44–1.00)
GFR, Estimated: 38 mL/min — ABNORMAL LOW (ref >60)
Glucose, Bld: 284 mg/dL — ABNORMAL HIGH (ref 70–99)
Potassium: 4.4 mmol/L (ref 3.5–5.1)
Sodium: 137 mmol/L (ref 135–145)
Total Bilirubin: 1 mg/dL (ref 0.3–1.2)
Total Protein: 5.8 g/dL — ABNORMAL LOW (ref 6.5–8.1)

## 2022-10-08 LAB — TROPONIN I (HIGH SENSITIVITY)
Troponin I (High Sensitivity): 6 ng/L (ref <18)
Troponin I (High Sensitivity): 7 ng/L (ref <18)

## 2022-10-08 LAB — APTT: aPTT: 31 seconds (ref 24–36)

## 2022-10-08 LAB — PROTIME-INR
INR: 1.2 (ref 0.8–1.2)
Prothrombin Time: 14.6 seconds (ref 11.4–15.2)

## 2022-10-08 LAB — LACTIC ACID, PLASMA: Lactic Acid, Venous: 1.9 mmol/L (ref 0.5–1.9)

## 2022-10-08 LAB — ETHANOL: Alcohol, Ethyl (B): <10 mg/dL (ref <10)

## 2022-10-08 MED ORDER — SODIUM CHLORIDE 0.9 % IV BOLUS (SEPSIS)
1000.0000 mL | Freq: Once | INTRAVENOUS | Status: AC
  Administered 2022-10-08: 1000 mL via INTRAVENOUS

## 2022-10-08 MED ORDER — SODIUM CHLORIDE 0.9 % IV SOLN
1000.0000 mL | INTRAVENOUS | Status: DC
  Administered 2022-10-08: 1000 mL via INTRAVENOUS

## 2022-10-08 MED ORDER — ATROPINE SULFATE 1 MG/10ML IJ SOSY
0.5000 mg | PREFILLED_SYRINGE | Freq: Once | INTRAMUSCULAR | Status: AC
  Administered 2022-10-08: 0.5 mg via INTRAVENOUS
  Filled 2022-10-08: qty 10

## 2022-10-08 MED ORDER — NALOXONE HCL 2 MG/2ML IJ SOSY
1.0000 mg | PREFILLED_SYRINGE | Freq: Once | INTRAMUSCULAR | Status: AC
  Administered 2022-10-08: 1 mg via INTRAVENOUS
  Filled 2022-10-08: qty 2

## 2022-10-08 NOTE — ED Provider Notes (Signed)
[place] EMERGENCY DEPARTMENT AT [facility] Provider Note   CSN: [id number] Arrival date & time: 10/08/22  1625     History  Chief Complaint  Patient presents with   Hypotension    [name] is a 72 y.o. female.  HPI   Patient has a history of hypertension diabetes, reflux, stroke, idiopathic hypotension, cervical radiculopathy, chronic low back pain, hypertension who presents to the ED for evaluation of altered mental status and hypotension.  Per family members patient took her Percocet and hypertension medication.  She had gradual decline in her mental status following that.  EMS was called and they noted the patient was alert but her blood pressure was low in the 60s over 40s.  She was given IV fluids and Narcan with some improvement in her mental status.  Patient initially told me that she did not take her Percocet today however family members at the bedside said that she had told them earlier that she did.  Patient denies feeling sick prior to taking her medications.  She is not having any chest pain.  She is not having any shortness of breath.  No abdominal pain.  No blood in her stool.  No vomiting or diarrhea.  No fevers  Home Medications Prior to Admission medications   Medication Sig Start Date End Date Taking? Authorizing Provider  ACCU-CHEK AVIVA PLUS test strip USE BID FOR GLUCOSE TESTING 12/19/14   [provider]  albuterol (VENTOLIN HFA) 108 (90 Base) MCG/ACT inhaler 1 puff inhaled 1-4 times daily PRN for 30 days 02/17/17   [provider]  allopurinol (ZYLOPRIM) 100 MG tablet Take 1 tablet (100 mg total) by mouth daily. 07/12/19 06/28/21  Sherryll Burger, Pratik D, DO  aspirin 81 MG chewable tablet Chew 1 tablet (81 mg total) by mouth daily. 09/20/19   Simmons-Robinson, Makiera, MD  atorvastatin (LIPITOR) 20 MG tablet Take 20 mg by mouth daily. 05/03/19   [provider]  cyclobenzaprine (FLEXERIL) 5 MG tablet Take 5 mg by mouth at  bedtime.  01/17/19   [provider]  diclofenac sodium (VOLTAREN) 1 % GEL Apply 2 g topically daily as needed (pain).  01/17/19   [provider]  donepezil (ARICEPT) 10 MG tablet TAKE 1 TABLET BY MOUTH AT BEDTIME Patient not taking: Reported on 08/01/2022 07/22/22   Micki Riley, MD  feeding supplement, GLUCERNA SHAKE, (GLUCERNA SHAKE) LIQD Take 237 mLs by mouth 3 (three) times daily between meals. Patient not taking: Reported on 08/01/2022 09/19/19   Simmons-Robinson, Tawanna Cooler, MD  ferrous sulfate 325 (65 FE) MG tablet Take 1 tablet (325 mg total) by mouth daily with breakfast. 07/12/19 06/28/21  Sherryll Burger, Pratik D, DO  folic acid (FOLVITE) 1 MG tablet Take 1 tablet (1 mg total) by mouth daily. 11/29/20   Micki Riley, MD  gabapentin (NEURONTIN) 300 MG capsule Take 300 mg by mouth 3 (three) times daily.    [provider]  glimepiride (AMARYL) 4 MG tablet Take 4 mg by mouth daily with breakfast.  Patient not taking: Reported on 08/01/2022 11/23/18   [provider]  losartan (COZAAR) 25 MG tablet Take 1 tablet (25 mg total) by mouth daily. 10/23/15   Vassie Loll, MD  meclizine (ANTIVERT) 25 MG tablet Take 1 tablet (25 mg total) by mouth 3 (three) times daily as needed for dizziness. 09/28/21   Melene Plan, DO  omeprazole (PRILOSEC) 20 MG capsule Take 20 mg by mouth daily.  10/05/14   [provider]  tiZANidine (ZANAFLEX) 4 MG tablet Take 4 mg by mouth 2 (two) times daily. 09/16/19   [provider]  topiramate (TOPAMAX) 100 MG tablet Take 1.5 tablets (150 mg total) by mouth 2 (two) times daily. 09/29/22   Micki Riley, MD      Allergies    Patient has no known allergies.    Review of Systems   Review of Systems  Physical Exam Updated Vital Signs BP (!) 150/69 (BP Location: Right Arm)   Pulse (!) 47   Temp (!) 97.5 F (36.4 C) (Oral)   Resp 16   Ht 1.626 m ( )   Wt 72.6 kg   SpO2 100%   BMI 27.47 kg/m  Physical Exam Vitals and nursing  note reviewed.  Constitutional:      Appearance: She is well-developed.     Comments: Awake and alert, answering questions slowly although speech does seem somewhat slow questionably mildly slurred  HENT:     Head: Normocephalic and atraumatic.     Right Ear: External ear normal.     Left Ear: External ear normal.  Eyes:     General: No scleral icterus.       Right eye: No discharge.        Left eye: No discharge.     Conjunctiva/sclera: Conjunctivae normal.  Neck:     Trachea: No tracheal deviation.  Cardiovascular:     Rate and Rhythm: Normal rate and regular rhythm.  Pulmonary:     Effort: Pulmonary effort is normal. No respiratory distress.     Breath sounds: Normal breath sounds. No stridor. No wheezing or rales.  Abdominal:     General: Bowel sounds are normal. There is no distension.     Palpations: Abdomen is soft.     Tenderness: There is no abdominal tenderness. There is no guarding or rebound.  Musculoskeletal:        General: No tenderness or deformity.     Cervical back: Neck supple.  Skin:    General: Skin is warm and dry.     Findings: No rash.  Neurological:     General: No focal deficit present.     Mental Status: She is alert and oriented to person, place, and time.     Cranial Nerves: No cranial nerve deficit, dysarthria or facial asymmetry.     Sensory: No sensory deficit.     Motor: No weakness, abnormal muscle tone or seizure activity.     Coordination: Coordination normal.  Psychiatric:        Mood and Affect: Mood normal.     ED Results / Procedures / Treatments   Labs (all labs ordered are listed, but only abnormal results are displayed) Labs Reviewed  COMPREHENSIVE METABOLIC PANEL - Abnormal; Notable for the following components:      Result Value   CO2 19 (*)    Glucose, Bld 284 (*)    BUN 28 (*)    Creatinine, Ser 1.48 (*)    Total Protein 5.8 (*)    Albumin 2.8 (*)    GFR, Estimated 38 (*)    All other components within normal limits   CBC WITH DIFFERENTIAL/PLATELET - Abnormal; Notable for the following components:   WBC 3.1 (*)    RBC 3.61 (*)    Hemoglobin 10.9 (*)    HCT 35.1 (*)    Platelets 72 (*)    Neutro Abs 1.5 (*)    All other components within normal limits  CULTURE, BLOOD (ROUTINE X 2)  CULTURE, BLOOD (ROUTINE X 2)  LACTIC ACID, PLASMA  PROTIME-INR  APTT  ETHANOL  URINALYSIS, W/ REFLEX TO CULTURE (INFECTION SUSPECTED)  TROPONIN I (HIGH SENSITIVITY)  TROPONIN I (HIGH SENSITIVITY)    EKG EKG Interpretation  Date/Time:  Wednesday October 08 2022 16:35:19 EDT Ventricular Rate:  49 PR Interval:  241 QRS Duration: 92 QT Interval:  498 QTC Calculation: 450 R Axis:   37 Text Interpretation: Sinus bradycardia Prolonged PR interval Since last tracing rate slower Confirmed by Linwood Dibbles [phone]) on 10/08/2022 4:49:25 PM  Radiology DG Chest Port 1 View  Result Date: 10/08/2022 CLINICAL DATA:  Questionable sepsis EXAM: PORTABLE CHEST 1 VIEW COMPARISON:  08/23/2022 FINDINGS: Cardiomegaly. Implantable loop recorder. Both lungs are clear. The visualized skeletal structures are unremarkable. IMPRESSION: Cardiomegaly without acute abnormality of the lungs in AP portable projection. Electronically Signed   By: Jearld Lesch M.D.   On: 10/08/2022 17:03    Procedures Procedures    Medications Ordered in ED Medications  sodium chloride 0.9 % bolus 1,000 mL (0 mLs Intravenous Stopped 10/08/22 1738)    Followed by  0.9 %  sodium chloride infusion (1,000 mLs Intravenous Bolus 10/08/22 1707)  naloxone (NARCAN) injection 1 mg (1 mg Intravenous Given 10/08/22 1706)  atropine 1 MG/10ML injection 0.5 mg (0.5 mg Intravenous Given 10/08/22 1748)    ED Course/ Medical Decision Making/ A&P Clinical Course as of 10/08/22 2111  Wed Oct 08, 2022  1817 Blood pressure has improved.  Patient did become more alert after an additional dose of Narcan [JK]  1856 Lactic acid level normal. [JK]  1856 Comprehensive metabolic  panel(!) Metabolic panel shows stable renal insufficiency.  Troponin normal. [JK]  1856 CBC with Differential(!) Hemoglobin stable [JK]  1916 Chest x-ray without acute abnormalities [JK]  2104 Blood pressure has remained stable.  132/66.  Heart rate has improved, remains bradycardic [JK]  2106  low 50s but sinus bradycardia [JK]    Clinical Course User Index [JK] Linwood Dibbles, MD                             Medical Decision Making Frontal diagnosis includes but not limited to, sepsis, cardiac dysrhythmia, medication adverse effect  Problems Addressed: Adverse effect of drug, initial encounter: acute illness or injury that poses a threat to life or bodily functions Bradycardia: acute illness or injury that poses a threat to life or bodily functions  Amount and/or Complexity of Data Reviewed Labs: ordered. Decision-making details documented in ED Course. Radiology: ordered and independent interpretation performed. ECG/medicine tests: ordered.  Risk Prescription drug management.   Patient presented to the ED for evaluation of decreased mental status after taking Percocet and her blood pressure medications.  Patient has had similar episodes in the past.  In the ED patient's speech did appear somewhat slow initially.  Her symptoms were consistent with opiate use.  Patient was given additional dose of Narcan with significant improvement.  Patient was also noted to be bradycardic and hypotensive.  She was given a dose of atropine.  Patient blood pressure improved.  She was monitored in the ED for several hours and had no recurrent episodes of hypotension.  She did remain bradycardiac but continued with a sinus bradycardia. Patient does not have any signs of acute infection.  No severe electrolyte abnormalities.  X-ray does not show pneumonia or pulmonary edema.  Discussed overnight stay in the hospital versus close  outpatient follow-up.  Patient states she is feeling better and comfortable with  going home.  I will have the patient stop her blood pressure medications.  Also told her to discontinue taking the Percocet.  With her bradycardia she might benefit from outpatient cardiac monitoring.  I will refer her to a cardiologist.       Final Clinical Impression(s) / ED Diagnoses Final diagnoses:  Adverse effect of drug, initial encounter  Bradycardia    Rx / DC Orders ED Discharge Orders          Ordered    Ambulatory referral to Cardiology       Comments: If you have not heard from the Cardiology office within the next 72 hours please call [phone].   10/08/22 2108              Linwood Dibbles, MD 10/08/22 2111

## 2022-10-08 NOTE — Discharge Instructions (Addendum)
Stop your blood pressure medications because of your low blood pressure.  Stop taking the Percocet because that also may have contributed to your symptoms.  Follow-up with your primary doctor and a cardiologist to be rechecked.  I have placed a referral to a cardiologist.  Return to the ED for any recurrent symptoms

## 2022-10-08 NOTE — ED Triage Notes (Signed)
Called out for lethargy pt child stated she took her percocet and HTN medication and then progressively had decrease in mental status per ems sounded as if she is taking too much of a dose of percocet as reported this has happened before.  Alert to verbal stimuli at first ems reports rr 12 and pupils pinpoint with BP 60/40  Currently has had 1100 NS and 1 mg narcan IV and mentation is more alert knows place name and DOB and unable to tell you the year today.

## 2022-10-09 LAB — CULTURE, BLOOD (ROUTINE X 2): Special Requests: ADEQUATE

## 2022-10-10 LAB — CULTURE, BLOOD (ROUTINE X 2)

## 2022-10-11 ENCOUNTER — Emergency Department (HOSPITAL_COMMUNITY)
Admission: EM | Admit: 2022-10-11 | Discharge: 2022-10-11 | Disposition: A | Discharge status: Home / Self Care | Payer: 59 | Attending: Emergency Medicine | Admitting: Emergency Medicine

## 2022-10-11 ENCOUNTER — Emergency Department (HOSPITAL_COMMUNITY): Payer: 59

## 2022-10-11 ENCOUNTER — Other Ambulatory Visit: Payer: Self-pay

## 2022-10-11 DIAGNOSIS — R001 Bradycardia, unspecified: Secondary | ICD-10-CM | POA: Diagnosis not present

## 2022-10-11 DIAGNOSIS — R739 Hyperglycemia, unspecified: Secondary | ICD-10-CM | POA: Insufficient documentation

## 2022-10-11 DIAGNOSIS — R4182 Altered mental status, unspecified: Secondary | ICD-10-CM | POA: Insufficient documentation

## 2022-10-11 LAB — RAPID URINE DRUG SCREEN, HOSP PERFORMED
Amphetamines: NOT DETECTED
Barbiturates: NOT DETECTED
Benzodiazepines: NOT DETECTED
Cocaine: NOT DETECTED
Opiates: NOT DETECTED
Tetrahydrocannabinol: NOT DETECTED

## 2022-10-11 LAB — CBC WITH DIFFERENTIAL/PLATELET
Abs Immature Granulocytes: 0.01 10*3/uL (ref 0.00–0.07)
Basophils Absolute: 0 10*3/uL (ref 0.0–0.1)
Basophils Relative: 1 %
Eosinophils Absolute: 0.2 10*3/uL (ref 0.0–0.5)
Eosinophils Relative: 5 %
HCT: 32.2 % — ABNORMAL LOW (ref 36.0–46.0)
Hemoglobin: 10.1 g/dL — ABNORMAL LOW (ref 12.0–15.0)
Immature Granulocytes: 0 %
Lymphocytes Relative: 31 %
Lymphs Abs: 1 10*3/uL (ref 0.7–4.0)
MCH: 30.2 pg (ref 26.0–34.0)
MCHC: 31.4 g/dL (ref 30.0–36.0)
MCV: 96.4 fL (ref 80.0–100.0)
Monocytes Absolute: 0.4 10*3/uL (ref 0.1–1.0)
Monocytes Relative: 13 %
Neutro Abs: 1.7 10*3/uL (ref 1.7–7.7)
Neutrophils Relative %: 50 %
Platelets: 62 10*3/uL — ABNORMAL LOW (ref 150–400)
RBC: 3.34 MIL/uL — ABNORMAL LOW (ref 3.87–5.11)
RDW: 15 % (ref 11.5–15.5)
WBC: 3.3 10*3/uL — ABNORMAL LOW (ref 4.0–10.5)
nRBC: 0 % (ref 0.0–0.2)

## 2022-10-11 LAB — I-STAT VENOUS BLOOD GAS, ED
Acid-base deficit: 12 mmol/L — ABNORMAL HIGH (ref 0.0–2.0)
Bicarbonate: 15.4 mmol/L — ABNORMAL LOW (ref 20.0–28.0)
Calcium, Ion: 1.29 mmol/L (ref 1.15–1.40)
HCT: 32 % — ABNORMAL LOW (ref 36.0–46.0)
Hemoglobin: 10.9 g/dL — ABNORMAL LOW (ref 12.0–15.0)
O2 Saturation: 59 %
Potassium: 4.3 mmol/L (ref 3.5–5.1)
Sodium: 138 mmol/L (ref 135–145)
TCO2: 17 mmol/L — ABNORMAL LOW (ref 22–32)
pCO2, Ven: 38.4 mmHg — ABNORMAL LOW (ref 44–60)
pH, Ven: 7.21 — ABNORMAL LOW (ref 7.25–7.43)
pO2, Ven: 37 mmHg (ref 32–45)

## 2022-10-11 LAB — COMPREHENSIVE METABOLIC PANEL
ALT: 26 U/L (ref 0–44)
AST: 30 U/L (ref 15–41)
Albumin: 2.5 g/dL — ABNORMAL LOW (ref 3.5–5.0)
Alkaline Phosphatase: 51 U/L (ref 38–126)
Anion gap: 6 (ref 5–15)
BUN: 21 mg/dL (ref 8–23)
CO2: 17 mmol/L — ABNORMAL LOW (ref 22–32)
Calcium: 8.2 mg/dL — ABNORMAL LOW (ref 8.9–10.3)
Chloride: 112 mmol/L — ABNORMAL HIGH (ref 98–111)
Creatinine, Ser: 1.48 mg/dL — ABNORMAL HIGH (ref 0.44–1.00)
GFR, Estimated: 38 mL/min — ABNORMAL LOW (ref >60)
Glucose, Bld: 433 mg/dL — ABNORMAL HIGH (ref 70–99)
Potassium: 4.2 mmol/L (ref 3.5–5.1)
Sodium: 135 mmol/L (ref 135–145)
Total Bilirubin: 0.5 mg/dL (ref 0.3–1.2)
Total Protein: 5.1 g/dL — ABNORMAL LOW (ref 6.5–8.1)

## 2022-10-11 LAB — URINALYSIS, ROUTINE W REFLEX MICROSCOPIC
Bilirubin Urine: NEGATIVE
Glucose, UA: >=500 mg/dL — AB
Ketones, ur: NEGATIVE mg/dL
Nitrite: NEGATIVE
Protein, ur: NEGATIVE mg/dL
Specific Gravity, Urine: 1.021 (ref 1.005–1.030)
WBC, UA: >50 WBC/hpf (ref 0–5)
pH: 5 (ref 5.0–8.0)

## 2022-10-11 LAB — TROPONIN I (HIGH SENSITIVITY): Troponin I (High Sensitivity): 7 ng/L (ref <18)

## 2022-10-11 LAB — CBG MONITORING, ED: Glucose-Capillary: 403 mg/dL — ABNORMAL HIGH (ref 70–99)

## 2022-10-11 MED ORDER — LACTATED RINGERS IV BOLUS
1000.0000 mL | Freq: Once | INTRAVENOUS | Status: AC
  Administered 2022-10-11: 1000 mL via INTRAVENOUS

## 2022-10-11 MED ORDER — NALOXONE HCL 0.4 MG/ML IJ SOLN: 0.2000 mg | Freq: Once | INTRAMUSCULAR | Status: DC

## 2022-10-11 MED ORDER — NALOXONE HCL 0.4 MG/ML IJ SOLN
0.4000 mg | Freq: Once | INTRAMUSCULAR | Status: AC
  Administered 2022-10-11: 0.4 mg via INTRAVENOUS
  Filled 2022-10-11: qty 1

## 2022-10-11 NOTE — ED Triage Notes (Signed)
Pt BIB EMS due to hypotension and bradycardia. Pt was at home and was having lethargy and slurred speeech, family called EMS, EMS states HR was 40, BP 60/30. Pt received 1mg  of atropine and 500cc of NS. BS 450.

## 2022-10-11 NOTE — ED Notes (Signed)
Pt eating and given a drink.

## 2022-10-11 NOTE — ED Provider Notes (Signed)
[place] EMERGENCY DEPARTMENT AT [facility] Provider Note   CSN: [id number] Arrival date & time:        History Chief Complaint  Patient presents with   Bradycardia   Hypotension    HPI [name] is a 72 y.o. female presenting for chief complaint of weakness and AMS and slurred speech. BIBA from home Lethargic, confused CBG 450 with EMS Patient denying symptoms to EMS Initial BP 60/30 with HR 40s 1st degree HB on EMS EKG S/p 500cc IVF and 1mg  atropine with BP improvement without HR change.  Per chart review, patient had a very similar presentation 2 days ago that happened after taking her medication, she was given Narcan IV fluid and return to mental status baseline was then stable for outpatient care and management.  Initially patient was too confused to provide history but after 4 hours in the emergency room she was reinterviewed and states that she felt fine this morning did take her Zanaflex and medications that were in her pillbox and approximate 2 hours later became confused. She now feels back to baseline during this evaluation. Patient's recorded medical, surgical, social, medication list and allergies were reviewed in the Snapshot window as part of the initial history.   Review of Systems   Review of Systems  Constitutional:  Negative for chills and fever.  HENT:  Negative for ear pain and sore throat.   Eyes:  Negative for pain and visual disturbance.  Respiratory:  Negative for cough and shortness of breath.   Cardiovascular:  Negative for chest pain and palpitations.  Gastrointestinal:  Negative for abdominal pain and vomiting.  Genitourinary:  Negative for dysuria and hematuria.  Musculoskeletal:  Negative for arthralgias and back pain.  Skin:  Negative for color change and rash.  Neurological:  Negative for seizures and syncope.  Psychiatric/Behavioral:  Positive for confusion.   All other systems reviewed and are  negative.   Physical Exam Updated Vital Signs BP (!) 133/96   Pulse (!) 49   Temp (!) 97.1 F (36.2 C) (Axillary)   Resp 19   Ht 5\' 4"  (1.626 m)   SpO2 100%   BMI 27.47 kg/m  Physical Exam Vitals and nursing note reviewed.  Constitutional:      General: She is not in acute distress.    Appearance: She is well-developed.  HENT:     Head: Normocephalic and atraumatic.  Eyes:     Conjunctiva/sclera: Conjunctivae normal.  Cardiovascular:     Rate and Rhythm: Normal rate and regular rhythm.     Heart sounds: No murmur heard. Pulmonary:     Effort: Pulmonary effort is normal. No respiratory distress.     Breath sounds: Normal breath sounds.  Abdominal:     General: There is no distension.     Palpations: Abdomen is soft.     Tenderness: There is no abdominal tenderness. There is no right CVA tenderness or left CVA tenderness.  Musculoskeletal:        General: No swelling or tenderness. Normal range of motion.     Cervical back: Neck supple.  Skin:    General: Skin is warm and dry.  Neurological:     General: No focal deficit present.     Mental Status: She is alert and oriented to person, place, and time. Mental status is at baseline.     Cranial Nerves: No cranial nerve deficit.     Comments: Pupils 1 mm bilaterally  ED Course/ Medical Decision Making/ A&P Clinical Course as of 10/11/22 2139  Sat Oct 11, 2022  2103 Creatinine(!): 1.48 Same as prior [CC]  2103 WBC(!): 3.3 Stable [CC]    Clinical Course User Index [CC] Glyn Ade, MD    Procedures .Critical Care  Performed by: Glyn Ade, MD Authorized by: Glyn Ade, MD   Critical care provider statement:    Critical care time (minutes):  30   Critical care was necessary to treat or prevent imminent or life-threatening deterioration of the following conditions:  CNS failure or compromise and toxidrome (medication overdose)   Critical care was time spent personally by me on the  following activities:  Development of treatment plan with patient or surrogate, discussions with consultants, evaluation of patient's response to treatment, examination of patient, ordering and review of laboratory studies, ordering and review of radiographic studies, ordering and performing treatments and interventions, pulse oximetry, re-evaluation of patient's condition and review of old charts    Medications Ordered in ED Medications  lactated ringers bolus 1,000 mL (0 mLs Intravenous Stopped 10/11/22 2047)  naloxone (NARCAN) injection 0.4 mg (0.4 mg Intravenous Given 10/11/22 1624)   Medical Decision Making:   Donika Butner is a 72 y.o. female who presented to the ED today with altered mental status detailed above.    Handoff received from EMS.  Complete initial physical exam performed, notably the patient  was with pinpoint pupils.  0.4 mg Narcan was administered with appropriate mental status resolution to baseline.    Reviewed and confirmed nursing documentation for past medical history, family history, social history.    Initial Assessment:   With the patient's presentation of altered mental status, most likely diagnosis is delerium 2/2 medication overdose versus infectious etiology (UTI/CAP/URI) vs metabolic abnormality (Na/K/Mg/Ca) vs nonspecific etiology. Other diagnoses were considered including (but not limited to) CVA, ICH, intracranial mass, critical dehydration, heptatic dysfunction, uremia, hypercarbia, intoxication, endrocrine abnormality, toxidrome. These are considered less likely due to history of present illness and physical exam findings.   This is most consistent with an acute life/limb threatening illness complicated by underlying chronic conditions.  Initial Plan:  Screening labs including CBC and Metabolic panel to evaluate for infectious or metabolic etiology of disease.  Urinalysis with reflex culture ordered to evaluate for UTI or relevant  urologic/nephrologic pathology.  CXR to evaluate for structural/infectious intrathoracic pathology.  Blood gas EKG to evaluate for cardiac pathology Objective evaluation as below reviewed   Initial Study Results:   Laboratory  Multiple abnormalities appreciated including hyperglycemia, initial pH of 7.2 and CO2 of 17 but no anion gap on exam.  Kidney function at baseline  EKG EKG was reviewed independently. Rate, rhythm, axis, intervals all examined and without medically relevant abnormality. ST segments without concerns for elevations.    Radiology:  All images reviewed independently. Agree with radiology report at this time.   DG Chest Portable 1 View  Result Date: 10/11/2022 CLINICAL DATA:  Altered mental status. EXAM: PORTABLE CHEST 1 VIEW COMPARISON:  October 08, 2022 FINDINGS: Cardiomediastinal silhouette is stable for portable technique. Mediastinal contours appear intact. There is no evidence of focal airspace consolidation, pleural effusion or pneumothorax. Low lung volumes. Osseous structures are without acute abnormality. Soft tissues are grossly normal. IMPRESSION: No active disease. Electronically Signed   By: Ted Mcalpine M.D.   On: 10/11/2022 16:29   DG Chest Port 1 View  Result Date: 10/08/2022 CLINICAL DATA:  Questionable sepsis EXAM: PORTABLE CHEST 1 VIEW COMPARISON:  08/23/2022  FINDINGS: Cardiomegaly. Implantable loop recorder. Both lungs are clear. The visualized skeletal structures are unremarkable. IMPRESSION: Cardiomegaly without acute abnormality of the lungs in AP portable projection. Electronically Signed   By: Jearld Lesch M.D.   On: 10/08/2022 17:03   CUP PACEART REMOTE DEVICE CHECK  Result Date: 09/17/2022 ILR summary report received. Battery status OK. Normal device function. No new symptom, tachy, brady, or pause episodes. No new AF episodes. Monthly summary reports and ROV/PRN LA, CVRS   Final Assessment and Plan:   I was called back to bedside as  patient is requesting discharge.  She has been ambulatory as tolerated p.o. intake and feels grossly significantly improved after IV fluids and Narcan.  Blood pressure is now normal.  Patient states that she took all the medications out of her pillbox this morning she does not know what was in their and thinks it is likely the same medications that caused her presentation yesterday. Given symptomatic treatment after Narcan, she is likely still has narcotic medication inside of her pillbox. Given complete resolution of symptoms, resolution of altered mental status and restoration of normal mental status as well as patient request for discharge I believe patient stable for outpatient care management without acute intervention.  Some mild bradycardia is residual with heart rates in the low 50s to upper 40s but patient is otherwise asymptomatic and has a longstanding history of similar bradycardia. I recommended she follow-up with cardiology as prescribed by prior ER provider and follow-up with PCP on Monday due to repeat episodes of likely polypharmacy.  I have been made medication recommendations for discontinuation of all sedating medications and medications that meet beers criteria. Disposition:  I have considered need for hospitalization, however, considering all of the above, I believe this patient is stable for discharge at this time.  Patient/family educated about specific return precautions for given chief complaint and symptoms.  Patient/family educated about follow-up with PCP.     Patient/family expressed understanding of return precautions and need for follow-up. Patient spoken to regarding all imaging and laboratory results and appropriate follow up for these results. All education provided in verbal form with additional information in written form. Time was allowed for answering of patient questions. Patient discharged.    Emergency Department Medication Summary:   Medications  lactated  ringers bolus 1,000 mL (0 mLs Intravenous Stopped 10/11/22 2047)  naloxone [facility]) injection 0.4 mg (0.4 mg Intravenous Given 10/11/22 1624)         Clinical Impression:  1. Bradycardia   2. Altered mental status, unspecified altered mental status type      Discharge   Final Clinical Impression(s) / ED Diagnoses Final diagnoses:  Bradycardia  Altered mental status, unspecified altered mental status type    Rx / DC Orders ED Discharge Orders     None         Glyn Ade, MD 10/11/22 2139

## 2022-10-11 NOTE — Discharge Instructions (Addendum)
Please discontinue your Flexeril your meclizine and your for Zanaflex as these are all sedating medications that are possibly causing your episodes of altered mental status. It is also critical that you do not take any narcotic pain medications until you are seen by your primary care provider If you have any other symptoms you should return for further care and management.

## 2022-10-13 LAB — CULTURE, BLOOD (ROUTINE X 2)
Culture: NO GROWTH
Culture: NO GROWTH

## 2022-10-20 ENCOUNTER — Ambulatory Visit (INDEPENDENT_AMBULATORY_CARE_PROVIDER_SITE_OTHER): Payer: 59

## 2022-10-20 DIAGNOSIS — I639 Cerebral infarction, unspecified: Secondary | ICD-10-CM | POA: Diagnosis not present

## 2022-10-20 LAB — CUP PACEART REMOTE DEVICE CHECK
Date Time Interrogation Session: 20240506002149
Implantable Pulse Generator Implant Date: 20210512

## 2022-10-27 NOTE — Progress Notes (Signed)
Carelink Summary Report / Loop Recorder 

## 2022-11-18 NOTE — Progress Notes (Signed)
Carelink Summary Report / Loop Recorder 

## 2022-11-24 ENCOUNTER — Ambulatory Visit (INDEPENDENT_AMBULATORY_CARE_PROVIDER_SITE_OTHER): Payer: 59

## 2022-11-24 DIAGNOSIS — I639 Cerebral infarction, unspecified: Secondary | ICD-10-CM | POA: Diagnosis not present

## 2022-11-24 LAB — CUP PACEART REMOTE DEVICE CHECK
Date Time Interrogation Session: 20240609230132
Implantable Pulse Generator Implant Date: 20210512

## 2022-12-16 NOTE — Progress Notes (Signed)
Carelink Summary Report / Loop Recorder 

## 2022-12-29 ENCOUNTER — Ambulatory Visit: Payer: 59

## 2022-12-29 DIAGNOSIS — I639 Cerebral infarction, unspecified: Secondary | ICD-10-CM

## 2022-12-30 LAB — CUP PACEART REMOTE DEVICE CHECK
Date Time Interrogation Session: 20240712230411
Implantable Pulse Generator Implant Date: 20210512

## 2023-01-04 ENCOUNTER — Emergency Department (HOSPITAL_COMMUNITY): Payer: 59

## 2023-01-04 ENCOUNTER — Other Ambulatory Visit: Payer: Self-pay

## 2023-01-04 ENCOUNTER — Emergency Department (HOSPITAL_COMMUNITY)
Admission: EM | Admit: 2023-01-04 | Discharge: 2023-01-04 | Disposition: A | Discharge status: Home / Self Care | Payer: 59 | Attending: Emergency Medicine | Admitting: Emergency Medicine

## 2023-01-04 DIAGNOSIS — N390 Urinary tract infection, site not specified: Secondary | ICD-10-CM

## 2023-01-04 DIAGNOSIS — E162 Hypoglycemia, unspecified: Secondary | ICD-10-CM | POA: Diagnosis present

## 2023-01-04 DIAGNOSIS — Z79899 Other long term (current) drug therapy: Secondary | ICD-10-CM | POA: Insufficient documentation

## 2023-01-04 DIAGNOSIS — Z7984 Long term (current) use of oral hypoglycemic drugs: Secondary | ICD-10-CM | POA: Diagnosis not present

## 2023-01-04 DIAGNOSIS — Z7982 Long term (current) use of aspirin: Secondary | ICD-10-CM | POA: Insufficient documentation

## 2023-01-04 DIAGNOSIS — I10 Essential (primary) hypertension: Secondary | ICD-10-CM | POA: Insufficient documentation

## 2023-01-04 DIAGNOSIS — E11649 Type 2 diabetes mellitus with hypoglycemia without coma: Secondary | ICD-10-CM | POA: Diagnosis not present

## 2023-01-04 LAB — URINALYSIS, ROUTINE W REFLEX MICROSCOPIC
Bilirubin Urine: NEGATIVE
Glucose, UA: NEGATIVE mg/dL
Hgb urine dipstick: NEGATIVE
Ketones, ur: NEGATIVE mg/dL
Nitrite: NEGATIVE
Protein, ur: NEGATIVE mg/dL
Specific Gravity, Urine: 1.014 (ref 1.005–1.030)
pH: 6 (ref 5.0–8.0)

## 2023-01-04 LAB — CBC WITH DIFFERENTIAL/PLATELET
Abs Immature Granulocytes: 0 10*3/uL (ref 0.00–0.07)
Basophils Absolute: 0 10*3/uL (ref 0.0–0.1)
Basophils Relative: 1 %
Eosinophils Absolute: 0.2 10*3/uL (ref 0.0–0.5)
Eosinophils Relative: 4 %
HCT: 41.8 % (ref 36.0–46.0)
Hemoglobin: 13.1 g/dL (ref 12.0–15.0)
Immature Granulocytes: 0 %
Lymphocytes Relative: 30 %
Lymphs Abs: 1.1 10*3/uL (ref 0.7–4.0)
MCH: 30.8 pg (ref 26.0–34.0)
MCHC: 31.3 g/dL (ref 30.0–36.0)
MCV: 98.1 fL (ref 80.0–100.0)
Monocytes Absolute: 0.4 10*3/uL (ref 0.1–1.0)
Monocytes Relative: 10 %
Neutro Abs: 1.9 10*3/uL (ref 1.7–7.7)
Neutrophils Relative %: 55 %
Platelets: 126 10*3/uL — ABNORMAL LOW (ref 150–400)
RBC: 4.26 MIL/uL (ref 3.87–5.11)
RDW: 13.1 % (ref 11.5–15.5)
WBC: 3.5 10*3/uL — ABNORMAL LOW (ref 4.0–10.5)
nRBC: 0 % (ref 0.0–0.2)

## 2023-01-04 LAB — CBG MONITORING, ED
Glucose-Capillary: 45 mg/dL — ABNORMAL LOW (ref 70–99)
Glucose-Capillary: 84 mg/dL (ref 70–99)
Glucose-Capillary: 111 mg/dL — ABNORMAL HIGH (ref 70–99)
Glucose-Capillary: 78 mg/dL (ref 70–99)

## 2023-01-04 LAB — COMPREHENSIVE METABOLIC PANEL
ALT: 20 U/L (ref 0–44)
AST: 14 U/L — ABNORMAL LOW (ref 15–41)
Albumin: 3.4 g/dL — ABNORMAL LOW (ref 3.5–5.0)
Alkaline Phosphatase: 64 U/L (ref 38–126)
Anion gap: 7 (ref 5–15)
BUN: 26 mg/dL — ABNORMAL HIGH (ref 8–23)
CO2: 22 mmol/L (ref 22–32)
Calcium: 9 mg/dL (ref 8.9–10.3)
Chloride: 107 mmol/L (ref 98–111)
Creatinine, Ser: 1.13 mg/dL — ABNORMAL HIGH (ref 0.44–1.00)
GFR, Estimated: 53 mL/min — ABNORMAL LOW (ref >60)
Glucose, Bld: 190 mg/dL — ABNORMAL HIGH (ref 70–99)
Potassium: 4.5 mmol/L (ref 3.5–5.1)
Sodium: 136 mmol/L (ref 135–145)
Total Bilirubin: 0.8 mg/dL (ref 0.3–1.2)
Total Protein: 6.9 g/dL (ref 6.5–8.1)

## 2023-01-04 MED ORDER — CEPHALEXIN 500 MG PO CAPS: 500.0000 mg | ORAL_CAPSULE | Freq: Four times a day (QID) | ORAL | 0 refills | Status: DC

## 2023-01-04 MED ORDER — CEPHALEXIN 500 MG PO CAPS
500.0000 mg | ORAL_CAPSULE | Freq: Once | ORAL | Status: AC
  Administered 2023-01-04: 500 mg via ORAL
  Filled 2023-01-04: qty 1

## 2023-01-04 NOTE — ED Provider Notes (Signed)
[place] EMERGENCY DEPARTMENT AT [facility] Provider Note   CSN: [id number] Arrival date & time: 01/04/23  1120     History  Chief Complaint  Patient presents with   Hypoglycemia    Katherine Tout is a 72 y.o. female.  Patient is a 72 year old female with a past medical history of diabetes on glimepiride, hypertension and GERD presenting to the emergency department for hypoglycemia.  Per EMS, they were called this morning for altered mental status and the patient was repetitively asking her son for her name.  On their arrival her her blood glucose was 38.  They did give her glucose and route within the movement of her sugar.  On arrival here her blood sugar again dropped to the 40s and she was given juice and started on D10 drip.  The patient states that she last took her diabetes medication yesterday morning and did not have any of her medications this morning.  She states that she did eat normally and felt like her normal self yesterday.  She denies any recent fevers, nausea, vomiting, diarrhea or constipation, chest or abdominal pain, dysuria or hematuria.  She denies any recent medication changes.  She states that she has been under increased stress recently and has had hypoglycemia in the past related to stress.  The history is provided by the patient.  Hypoglycemia      Home Medications Prior to Admission medications   Medication Sig Start Date End Date Taking? Authorizing Provider  cephALEXin (KEFLEX) 500 MG capsule Take 1 capsule (500 mg total) by mouth 4 (four) times daily. 01/04/23  Yes Theresia Lo, Turkey K, DO  ACCU-CHEK AVIVA PLUS test strip USE BID FOR GLUCOSE TESTING 12/19/14   [provider]  albuterol (VENTOLIN HFA) 108 (90 Base) MCG/ACT inhaler 1 puff inhaled 1-4 times daily PRN for 30 days 02/17/17   [provider]  allopurinol (ZYLOPRIM) 100 MG tablet Take 1 tablet (100 mg total) by mouth daily. 07/12/19 06/28/21  Sherryll Burger, Pratik D,  DO  aspirin 81 MG chewable tablet Chew 1 tablet (81 mg total) by mouth daily. 09/20/19   Simmons-Robinson, Makiera, MD  atorvastatin (LIPITOR) 20 MG tablet Take 20 mg by mouth daily. 05/03/19   [provider]  diclofenac sodium (VOLTAREN) 1 % GEL Apply 2 g topically daily as needed (pain).  01/17/19   [provider]  donepezil (ARICEPT) 10 MG tablet TAKE 1 TABLET BY MOUTH AT BEDTIME Patient not taking: Reported on 08/01/2022 07/22/22   Micki Riley, MD  feeding supplement, GLUCERNA SHAKE, (GLUCERNA SHAKE) LIQD Take 237 mLs by mouth 3 (three) times daily between meals. Patient not taking: Reported on 08/01/2022 09/19/19   Simmons-Robinson, Tawanna Cooler, MD  ferrous sulfate 325 (65 FE) MG tablet Take 1 tablet (325 mg total) by mouth daily with breakfast. 07/12/19 06/28/21  Sherryll Burger, Pratik D, DO  folic acid (FOLVITE) 1 MG tablet Take 1 tablet (1 mg total) by mouth daily. 11/29/20   Micki Riley, MD  gabapentin (NEURONTIN) 300 MG capsule Take 300 mg by mouth 3 (three) times daily.    [provider]  glimepiride (AMARYL) 4 MG tablet Take 4 mg by mouth daily with breakfast.  Patient not taking: Reported on 08/01/2022 11/23/18   [provider]  losartan (COZAAR) 25 MG tablet Take 1 tablet (25 mg total) by mouth daily. 10/23/15   Vassie Loll, MD  omeprazole (PRILOSEC) 20 MG capsule Take 20 mg by mouth daily.  10/05/14   [provider]  topiramate (TOPAMAX) 100 MG tablet Take 1.5 tablets (150 mg total) by mouth 2 (two) times daily. 09/29/22   Micki Riley, MD      Allergies    Patient has no known allergies.    Review of Systems   Review of Systems  Physical Exam Updated Vital Signs BP (!) 168/81   Pulse 77   Temp 98.1 F (36.7 C) (Oral)   Resp 20   Ht 5\' 4"  (1.626 m)   Wt 72.6 kg   SpO2 100%   BMI 27.47 kg/m  Physical Exam Vitals and nursing note reviewed.  Constitutional:      General: She is not in acute distress.    Appearance: Normal appearance.   HENT:     Head: Normocephalic and atraumatic.     Nose: Nose normal.     Mouth/Throat:     Mouth: Mucous membranes are moist.     Pharynx: Oropharynx is clear.  Eyes:     Extraocular Movements: Extraocular movements intact.     Conjunctiva/sclera: Conjunctivae normal.  Cardiovascular:     Rate and Rhythm: Normal rate and regular rhythm.     Heart sounds: Normal heart sounds.  Pulmonary:     Effort: Pulmonary effort is normal.     Breath sounds: Normal breath sounds.  Abdominal:     General: Abdomen is flat.     Palpations: Abdomen is soft.     Tenderness: There is no abdominal tenderness.  Musculoskeletal:        General: Normal range of motion.     Cervical back: Normal range of motion.  Skin:    General: Skin is warm and dry.  Neurological:     General: No focal deficit present.     Mental Status: She is alert and oriented to person, place, and time.     Sensory: No sensory deficit.     Motor: No weakness.  Psychiatric:        Mood and Affect: Mood normal.        Behavior: Behavior normal.     ED Results / Procedures / Treatments   Labs (all labs ordered are listed, but only abnormal results are displayed) Labs Reviewed  COMPREHENSIVE METABOLIC PANEL - Abnormal; Notable for the following components:      Result Value   Glucose, Bld 190 (*)    BUN 26 (*)    Creatinine, Ser 1.13 (*)    Albumin 3.4 (*)    AST 14 (*)    GFR, Estimated 53 (*)    All other components within normal limits  CBC WITH DIFFERENTIAL/PLATELET - Abnormal; Notable for the following components:   WBC 3.5 (*)    Platelets 126 (*)    All other components within normal limits  URINALYSIS, ROUTINE W REFLEX MICROSCOPIC - Abnormal; Notable for the following components:   APPearance HAZY (*)    Leukocytes,Ua MODERATE (*)    Bacteria, UA FEW (*)    All other components within normal limits  CBG MONITORING, ED - Abnormal; Notable for the following components:   Glucose-Capillary 45 (*)    All  other components within normal limits  CBG MONITORING, ED - Abnormal; Notable for the following components:   Glucose-Capillary 111 (*)    All other components within normal limits  CBG MONITORING, ED  CBG MONITORING, ED    EKG EKG Interpretation Date/Time:  Sunday January 04 2023 12:19:08 EDT Ventricular Rate:  76 PR Interval:  188 QRS  Duration:  86 QT Interval:  395 QTC Calculation: 445 R Axis:   27  Text Interpretation: Sinus rhythm Consider left ventricular hypertrophy Anterior Q waves, possibly due to LVH No significant change since last tracing Confirmed by Elayne Snare ([id number]) on 01/04/2023 12:32:56 PM  Radiology DG Chest Port 1 View  Result Date: 01/04/2023 CLINICAL DATA:  Altered mental status. History of diabetes and hypertension. EXAM: PORTABLE CHEST 1 VIEW COMPARISON:  Radiographs 10/11/2022, 10/08/2022 and 07/14/2008. FINDINGS: 1223 hours. Improved positioning and degree of inspiration compared with the most recent prior study. The heart size and mediastinal contours are normal. The lungs are clear. There is no pleural effusion or pneumothorax. No acute osseous findings are identified. Probable loop recorder over the left chest. Telemetry leads overlie the chest as well. IMPRESSION: No evidence of acute cardiopulmonary process. Electronically Signed   By: Carey Bullocks M.D.   On: 01/04/2023 12:34    Procedures Procedures    Medications Ordered in ED Medications  cephALEXin (KEFLEX) capsule 500 mg (500 mg Oral Given 01/04/23 1347)    ED Course/ Medical Decision Making/ A&P Clinical Course as of 01/04/23 1440  Sun Jan 04, 2023  1245 Cr at baseline [VK]  1315 Moderate leuks and few bacteria in UA. Glucose normal after eating. [VK]  1437 BG has remained stable, tolerating PO eating at bedside asymptomatic. She is stable for discharge home with outpatient follow up. [VK]    Clinical Course User Index [VK] Rexford Maus, DO                              Medical Decision Making This patient presents to the ED with chief complaint(s) of hypoglycemia with pertinent past medical history of diabetes, hypertension, GERD which further complicates the presenting complaint. The complaint involves an extensive differential diagnosis and also carries with it a high risk of complications and morbidity.    The differential diagnosis includes medication side effect, dehydration, infection  Additional history obtained: Additional history obtained from EMS  Records reviewed previous admission documents  ED Course and Reassessment: On patient's arrival to the emergency department initial Accu-Chek was 45.  She was given glucose with improvement to the 80s and remains asymptomatic.  She will be given food to eat.  She will have labs, urine, EKG and chest x-ray to evaluate for possible etiology of her hypoglycemia and will have her glucose closely rechecked.  Independent labs interpretation:  The following labs were independently interpreted: hypoglycemia, UA positive for UTI  Independent visualization of imaging: - I independently visualized the following imaging with scope of interpretation limited to determining acute life threatening conditions related to emergency care: CXR, which revealed no acute disease  Consultation: - Consulted or discussed management/test interpretation w/ external professional: N/A  Consideration for admission or further workup: Patient has no emergent conditions requiring admission or further work-up at this time and is stable for discharge home with primary care follow-up  Social Determinants of health: N/A  Amount and/or Complexity of Data Reviewed Labs: ordered. Radiology: ordered.  Risk Prescription drug management.          Final Clinical Impression(s) / ED Diagnoses Final diagnoses:  Hypoglycemia  Lower urinary tract infectious disease    Rx / DC Orders ED Discharge Orders          Ordered     cephALEXin (KEFLEX) 500 MG capsule  4 times daily  01/04/23 1438              Elayne Snare K, DO 01/04/23 1440

## 2023-01-04 NOTE — ED Triage Notes (Signed)
Pt arrived via EMS from home. Family called stating Pt had altered mental status, was repeating son's name. Blood sugar checked, was 34, given glucose tablet and sugar went to 112. CBG then went to 380 and dropped again. CBG 45 on arrival, pt given OJ. Pt arrives on D10 drip. A&Ox4 on arrival.

## 2023-01-04 NOTE — Discharge Instructions (Signed)
You were seen in the emergency department for your low blood sugar.  You did have a urinary tract infection which might be causing your sugar to drop.  Do not take your diabetes medications today and you can resume it tomorrow if you are continuing to feel well.  You should check your blood sugar before meals and before bedtime today to make sure that it is staying normal and make sure that you are eating and drinking normally today.  I have given you prescription of antibiotics and you should complete these as prescribed.  You should follow-up with your primary doctor to have your symptoms rechecked and to see if you need any changes to your diabetes medication.  You should return to the emergency department if you have recurrent episodes of low blood sugar, you develop fevers despite the antibiotics, severe abdominal pain, repetitive vomiting or any other new or concerning symptoms.

## 2023-01-04 NOTE — ED Notes (Signed)
Pt given lunch tray.

## 2023-01-12 NOTE — Progress Notes (Signed)
Carelink Summary Report / Loop Recorder 

## 2023-02-02 ENCOUNTER — Ambulatory Visit (INDEPENDENT_AMBULATORY_CARE_PROVIDER_SITE_OTHER): Payer: 59

## 2023-02-02 DIAGNOSIS — I639 Cerebral infarction, unspecified: Secondary | ICD-10-CM | POA: Diagnosis not present

## 2023-02-03 LAB — CUP PACEART REMOTE DEVICE CHECK
Date Time Interrogation Session: 20240818230134
Implantable Pulse Generator Implant Date: 20210512

## 2023-02-04 ENCOUNTER — Other Ambulatory Visit: Payer: Self-pay | Admitting: Neurology

## 2023-02-07 ENCOUNTER — Encounter (HOSPITAL_COMMUNITY): Payer: Self-pay

## 2023-02-07 ENCOUNTER — Other Ambulatory Visit: Payer: Self-pay

## 2023-02-07 ENCOUNTER — Observation Stay (HOSPITAL_COMMUNITY)
Admission: EM | Admit: 2023-02-07 | Payer: 59 | Source: Home / Self Care | Attending: Emergency Medicine | Admitting: Emergency Medicine

## 2023-02-07 ENCOUNTER — Emergency Department (HOSPITAL_COMMUNITY): Payer: 59

## 2023-02-07 DIAGNOSIS — M069 Rheumatoid arthritis, unspecified: Secondary | ICD-10-CM | POA: Insufficient documentation

## 2023-02-07 DIAGNOSIS — E162 Hypoglycemia, unspecified: Principal | ICD-10-CM | POA: Diagnosis present

## 2023-02-07 DIAGNOSIS — D696 Thrombocytopenia, unspecified: Secondary | ICD-10-CM | POA: Insufficient documentation

## 2023-02-07 DIAGNOSIS — F0390 Unspecified dementia without behavioral disturbance: Secondary | ICD-10-CM | POA: Insufficient documentation

## 2023-02-07 DIAGNOSIS — K529 Noninfective gastroenteritis and colitis, unspecified: Secondary | ICD-10-CM

## 2023-02-07 DIAGNOSIS — E11649 Type 2 diabetes mellitus with hypoglycemia without coma: Principal | ICD-10-CM | POA: Insufficient documentation

## 2023-02-07 DIAGNOSIS — K219 Gastro-esophageal reflux disease without esophagitis: Secondary | ICD-10-CM | POA: Diagnosis not present

## 2023-02-07 DIAGNOSIS — I10 Essential (primary) hypertension: Secondary | ICD-10-CM | POA: Diagnosis not present

## 2023-02-07 DIAGNOSIS — Z1152 Encounter for screening for COVID-19: Secondary | ICD-10-CM | POA: Insufficient documentation

## 2023-02-07 LAB — GLUCOSE, CAPILLARY
Glucose-Capillary: 32 mg/dL — CL (ref 70–99)
Glucose-Capillary: 107 mg/dL — ABNORMAL HIGH (ref 70–99)
Glucose-Capillary: 86 mg/dL (ref 70–99)

## 2023-02-07 LAB — COMPREHENSIVE METABOLIC PANEL
ALT: 47 U/L — ABNORMAL HIGH (ref 0–44)
AST: 33 U/L (ref 15–41)
Albumin: 3.1 g/dL — ABNORMAL LOW (ref 3.5–5.0)
Alkaline Phosphatase: 74 U/L (ref 38–126)
Anion gap: 7 (ref 5–15)
BUN: 26 mg/dL — ABNORMAL HIGH (ref 8–23)
CO2: 18 mmol/L — ABNORMAL LOW (ref 22–32)
Calcium: 8.5 mg/dL — ABNORMAL LOW (ref 8.9–10.3)
Chloride: 114 mmol/L — ABNORMAL HIGH (ref 98–111)
Creatinine, Ser: 1.07 mg/dL — ABNORMAL HIGH (ref 0.44–1.00)
GFR, Estimated: 56 mL/min — ABNORMAL LOW (ref >60)
Glucose, Bld: 116 mg/dL — ABNORMAL HIGH (ref 70–99)
Potassium: 3.6 mmol/L (ref 3.5–5.1)
Sodium: 139 mmol/L (ref 135–145)
Total Bilirubin: 0.3 mg/dL (ref 0.3–1.2)
Total Protein: 6.4 g/dL — ABNORMAL LOW (ref 6.5–8.1)

## 2023-02-07 LAB — CBC WITH DIFFERENTIAL/PLATELET
Abs Immature Granulocytes: 0.01 10*3/uL (ref 0.00–0.07)
Basophils Absolute: 0 10*3/uL (ref 0.0–0.1)
Basophils Relative: 1 %
Eosinophils Absolute: 0.1 10*3/uL (ref 0.0–0.5)
Eosinophils Relative: 2 %
HCT: 37.6 % (ref 36.0–46.0)
Hemoglobin: 12.2 g/dL (ref 12.0–15.0)
Immature Granulocytes: 0 %
Lymphocytes Relative: 31 %
Lymphs Abs: 0.9 10*3/uL (ref 0.7–4.0)
MCH: 31.4 pg (ref 26.0–34.0)
MCHC: 32.4 g/dL (ref 30.0–36.0)
MCV: 96.7 fL (ref 80.0–100.0)
Monocytes Absolute: 0.4 10*3/uL (ref 0.1–1.0)
Monocytes Relative: 14 %
Neutro Abs: 1.5 10*3/uL — ABNORMAL LOW (ref 1.7–7.7)
Neutrophils Relative %: 52 %
Platelets: 80 10*3/uL — ABNORMAL LOW (ref 150–400)
RBC: 3.89 MIL/uL (ref 3.87–5.11)
RDW: 12.9 % (ref 11.5–15.5)
Smear Review: DECREASED
WBC: 2.8 10*3/uL — ABNORMAL LOW (ref 4.0–10.5)
nRBC: 0 % (ref 0.0–0.2)

## 2023-02-07 LAB — LIPASE, BLOOD: Lipase: 133 U/L — ABNORMAL HIGH (ref 11–51)

## 2023-02-07 LAB — HIV ANTIBODY (ROUTINE TESTING W REFLEX): HIV Screen 4th Generation wRfx: NONREACTIVE

## 2023-02-07 LAB — CBG MONITORING, ED
Glucose-Capillary: 106 mg/dL — ABNORMAL HIGH (ref 70–99)
Glucose-Capillary: 59 mg/dL — ABNORMAL LOW (ref 70–99)
Glucose-Capillary: 62 mg/dL — ABNORMAL LOW (ref 70–99)

## 2023-02-07 LAB — SARS CORONAVIRUS 2 BY RT PCR: SARS Coronavirus 2 by RT PCR: NEGATIVE

## 2023-02-07 MED ORDER — DEXTROSE 50 % IV SOLN: 1.0000 | INTRAVENOUS | Status: DC | PRN

## 2023-02-07 MED ORDER — DEXTROSE 5 % IV SOLN: INTRAVENOUS | Status: DC

## 2023-02-07 MED ORDER — OXYCODONE HCL 5 MG PO TABS: 5.0000 mg | ORAL_TABLET | Freq: Four times a day (QID) | ORAL | Status: DC | PRN

## 2023-02-07 MED ORDER — MELOXICAM 7.5 MG PO TABS
15.0000 mg | ORAL_TABLET | Freq: Every day | ORAL | Status: DC
  Administered 2023-02-07 – 2023-02-08 (×2): 15 mg via ORAL
  Filled 2023-02-07 (×2): qty 2

## 2023-02-07 MED ORDER — DICLOFENAC SODIUM 1 % EX GEL: 2.0000 g | Freq: Every day | CUTANEOUS | Status: DC | PRN

## 2023-02-07 MED ORDER — DEXTROSE 50 % IV SOLN: 25.0000 mL | Freq: Once | INTRAVENOUS | Status: DC

## 2023-02-07 MED ORDER — GABAPENTIN 300 MG PO CAPS
300.0000 mg | ORAL_CAPSULE | Freq: Three times a day (TID) | ORAL | Status: DC
  Administered 2023-02-07 – 2023-02-08 (×3): 300 mg via ORAL
  Filled 2023-02-07 (×3): qty 1

## 2023-02-07 MED ORDER — OXYCODONE-ACETAMINOPHEN 5-325 MG PO TABS: 1.0000 | ORAL_TABLET | Freq: Four times a day (QID) | ORAL | Status: DC | PRN

## 2023-02-07 MED ORDER — PANTOPRAZOLE SODIUM 40 MG PO TBEC
40.0000 mg | DELAYED_RELEASE_TABLET | Freq: Every day | ORAL | Status: DC
  Administered 2023-02-07 – 2023-02-08 (×2): 40 mg via ORAL
  Filled 2023-02-07 (×2): qty 1

## 2023-02-07 MED ORDER — RIVAROXABAN 10 MG PO TABS
10.0000 mg | ORAL_TABLET | Freq: Every day | ORAL | Status: DC
  Administered 2023-02-07 – 2023-02-08 (×2): 10 mg via ORAL
  Filled 2023-02-07 (×2): qty 1

## 2023-02-07 MED ORDER — SODIUM CHLORIDE 0.9% FLUSH: 3.0000 mL | INTRAVENOUS | Status: DC | PRN

## 2023-02-07 MED ORDER — TOPIRAMATE 25 MG PO TABS
150.0000 mg | ORAL_TABLET | Freq: Two times a day (BID) | ORAL | Status: DC
  Administered 2023-02-07 – 2023-02-08 (×2): 150 mg via ORAL
  Filled 2023-02-07 (×2): qty 6

## 2023-02-07 MED ORDER — OXYCODONE-ACETAMINOPHEN 10-325 MG PO TABS: 1.0000 | ORAL_TABLET | Freq: Four times a day (QID) | ORAL | Status: DC | PRN

## 2023-02-07 MED ORDER — DONEPEZIL HCL 10 MG PO TABS
10.0000 mg | ORAL_TABLET | Freq: Every day | ORAL | Status: DC
  Administered 2023-02-07: 10 mg via ORAL
  Filled 2023-02-07: qty 1

## 2023-02-07 MED ORDER — DEXTROSE 50 % IV SOLN
INTRAVENOUS | Status: AC
  Administered 2023-02-07: 50 mL via INTRAVENOUS
  Filled 2023-02-07: qty 50

## 2023-02-07 MED ORDER — LACTATED RINGERS IV BOLUS
1000.0000 mL | Freq: Once | INTRAVENOUS | Status: AC
  Administered 2023-02-07: 1000 mL via INTRAVENOUS

## 2023-02-07 MED ORDER — TRAZODONE HCL 50 MG PO TABS
50.0000 mg | ORAL_TABLET | Freq: Every day | ORAL | Status: DC
  Administered 2023-02-07: 50 mg via ORAL
  Filled 2023-02-07: qty 1

## 2023-02-07 MED ORDER — ATORVASTATIN CALCIUM 10 MG PO TABS
20.0000 mg | ORAL_TABLET | Freq: Every day | ORAL | Status: DC
  Administered 2023-02-07 – 2023-02-08 (×2): 20 mg via ORAL
  Filled 2023-02-07 (×2): qty 2

## 2023-02-07 MED ORDER — ASPIRIN 81 MG PO CHEW
81.0000 mg | CHEWABLE_TABLET | Freq: Every day | ORAL | Status: DC
  Administered 2023-02-07 – 2023-02-08 (×2): 81 mg via ORAL
  Filled 2023-02-07 (×2): qty 1

## 2023-02-07 MED ORDER — ONDANSETRON HCL 4 MG/2ML IJ SOLN
4.0000 mg | Freq: Once | INTRAMUSCULAR | Status: AC
  Administered 2023-02-07: 4 mg via INTRAVENOUS
  Filled 2023-02-07: qty 2

## 2023-02-07 MED ORDER — TIZANIDINE HCL 4 MG PO TABS: 4.0000 mg | ORAL_TABLET | Freq: Two times a day (BID) | ORAL | Status: DC | PRN

## 2023-02-07 MED ORDER — SUMATRIPTAN SUCCINATE 50 MG PO TABS: 50.0000 mg | ORAL_TABLET | Freq: Every day | ORAL | Status: DC | PRN

## 2023-02-07 MED ORDER — LOSARTAN POTASSIUM 50 MG PO TABS
25.0000 mg | ORAL_TABLET | Freq: Every day | ORAL | Status: DC
  Administered 2023-02-07: 25 mg via ORAL
  Filled 2023-02-07: qty 1

## 2023-02-07 MED ORDER — SODIUM CHLORIDE 0.9% FLUSH
3.0000 mL | Freq: Two times a day (BID) | INTRAVENOUS | Status: DC
  Administered 2023-02-07 (×2): 3 mL via INTRAVENOUS

## 2023-02-07 MED ORDER — SODIUM CHLORIDE 0.9 % IV SOLN: 250.0000 mL | INTRAVENOUS | Status: DC | PRN

## 2023-02-07 NOTE — H&P (Cosign Needed Addendum)
Date: 02/07/2023               Patient Name:  [name] MRN: [id number]  DOB: 09-24-1950 Age / Sex: 72 y.o., female   PCP: Loura Back, NP         Medical Service: Internal Medicine Teaching Service         Attending Physician: Dr. Gust Rung, DO      First Contact: Dr. Laretta Bolster, MD Pager [phone]    Second Contact: Dr. Olegario Messier, MD Pager [phone]         After Hours (After 5p/  First Contact Pager: [phone]  weekends / holidays): Second Contact Pager: [phone]   SUBJECTIVE   Chief Complaint: Altered Mental Status and Low Blood Sugar  History of Present Illness:   Patient is a 72 year old female with a significant past medical history of T2DM on glimepiride and Mounjaro, Dementia, Arthritis, HTN, HLD, GERD, and chronic back pain. She presented to the ED via EMS for Altered mental status and hypoglycemia. Patient states for about 4 days she has had diarrhea from a stomach bug, denied any nausea of vomiting. She also states she has not had much of an appetite recently.  The patient endorsed that she woke up to EMS around her after her grandson checked on her via cameras and found her sleeping oddly. Sugar ar that time was in the 30s. She states she gets low sugars like this previously and usually will drink orange juice, peanut butter, and honey. She was confused around the events of the episode but denies any sweating, palpitations, vision changes.  She was eating at time of exam and was back to baseline and denies any abdominal pain, nausea, vomiting, or dizziness.  Patient states last hospital admission was about 1 month ago and for the same thing, she endorses a history of low sugars.  ED Course: Patient was evaluated in the Emergency Department and surgars were frequently monitored. General labs including CBC with diff, CMP, lipase, UA, EKG, and IV fluids were started. An x ray of the patients chest was obtained as she endorsed a cough.The patients home  meds were ordered and IM was consulted to monitor glucose after the patient had an incident of a low glucose.   Meds:  Albuterol inhaler PRN Aspirin 81mg   Buprenorphine patches - as needed last time two weeks Donepezil 10mg  daily Ferrous sulfate 325 once a week Gabapentin 300mg  TID Glimepiride 4mg  Daily Losartan 25mg   Mounjaro 5mg   Omeprazole  20mg  daily Pantoprazole  Sumatriptan 50mg  PRN Tizanidine 4mg  PRN Topamax 100mg  PRN Trazodone 50mg  PRN  Past Medical History  Past Surgical History:  Procedure Laterality Date   ABDOMINAL HYSTERECTOMY     partial   BACK SURGERY     BUBBLE STUDY  07/11/2019   Procedure: BUBBLE STUDY;  Surgeon: Orpah Cobb, MD;  Location: [facility] ENDOSCOPY;  Service: Cardiovascular;;   CHOLECYSTECTOMY     COLONOSCOPY     age 85- doesnt know where or who    implantable loop recorder placement  10/26/2019   [employer] Reveal [place] model [id number] ([id number]) implantable loop recorder implanted by Dr Johney Frame for cryptogenic stroke   left shoulder     x 2 surgeries   right shoulder     x2   SHOULDER OPEN ROTATOR CUFF REPAIR Right 08/25/2012   Procedure: ROTATOR CUFF REPAIR SHOULDER OPEN WITH ACROMIOPLASTY;  Surgeon: Jacki Cones, MD;  Location: [facility] ORS;  Service: Orthopedics;  Laterality: Right;  TEE WITHOUT CARDIOVERSION N/A 07/11/2019   Procedure: TRANSESOPHAGEAL ECHOCARDIOGRAM (TEE);  Surgeon: Orpah Cobb, MD;  Location: [facility] ENDOSCOPY;  Service: Cardiovascular;  Laterality: N/A;   TONSILLECTOMY      Social:  Lives With: Lives by herself, and grandson lives in complex, 5 great grandkids  Occupation: not asked Support: Grandson lives nearby Level of Function: Independent with ADL PCP:Kim Cyndie Chime  Substances: Never smoked or use illicit drugs  Family History:  Family History  Problem Relation Age of Onset   Colon cancer Father    Colon cancer Paternal Grandfather    Colon cancer Maternal Grandfather    Bone cancer Maternal Uncle    Colon polyps Neg  Hx    Esophageal cancer Neg Hx    Rectal cancer Neg Hx    Stomach cancer Neg Hx      Allergies: Allergies as of 02/07/2023   (No Known Allergies)  Patient Denies any allergies to Medications.  Review of Systems: A complete ROS was negative except as per HPI.  Review of Systems  Constitutional:  Positive for malaise/fatigue and weight loss. Negative for diaphoresis.  Eyes:  Negative for blurred vision and double vision.  Respiratory:  Positive for cough. Negative for sputum production, shortness of breath and wheezing.   Cardiovascular:  Negative for chest pain, palpitations and leg swelling.  Gastrointestinal:  Positive for diarrhea. Negative for abdominal pain, constipation, nausea and vomiting.  Genitourinary:  Negative for dysuria, flank pain, frequency and urgency.  Musculoskeletal:  Positive for back pain and joint pain.  Neurological:  Negative for seizures and headaches.  Psychiatric/Behavioral:  Positive for memory loss. Negative for substance abuse.      OBJECTIVE:   Physical Exam: Blood pressure (!) 147/64, pulse 62, temperature 98.2 F (36.8 C), temperature source Oral, resp. rate 11, height 5\' 3"  (1.6 m), weight 65.8 kg, SpO2 100%.  Constitutional: well-appearing female, sitting up in bed eating food, in no acute distress HENT: normocephalic atraumatic, mucous membranes moist Eyes: conjunctiva non-erythematous Neck: supple Cardiovascular: regular rate and rhythm, no m/r/g Pulmonary/Chest: normal work of breathing on room air, lungs clear to auscultation bilaterally Abdominal: soft, non-tender, non-distended MSK: normal bulk and tone Neurological: alert & oriented to person time and place, 4/5 strength in bilateral upper and lower extremities, normal gait Skin: warm and dry Psych: Pleasant, Appropriate mood and affect   Labs: CBC    Component Value Date/Time   WBC 3.5 (L) 01/04/2023 1203   RBC 4.26 01/04/2023 1203   HGB 13.1 01/04/2023 1203   HCT 41.8  01/04/2023 1203   PLT 126 (L) 01/04/2023 1203   MCV 98.1 01/04/2023 1203   MCH 30.8 01/04/2023 1203   MCHC 31.3 01/04/2023 1203   RDW 13.1 01/04/2023 1203   LYMPHSABS 1.1 01/04/2023 1203   MONOABS 0.4 01/04/2023 1203   EOSABS 0.2 01/04/2023 1203   BASOSABS 0.0 01/04/2023 1203     CMP     Component Value Date/Time   NA 139 02/07/2023 1121   K 3.6 02/07/2023 1121   CL 114 (H) 02/07/2023 1121   CO2 18 (L) 02/07/2023 1121   GLUCOSE 116 (H) 02/07/2023 1121   BUN 26 (H) 02/07/2023 1121   CREATININE 1.07 (H) 02/07/2023 1121   CALCIUM 8.5 (L) 02/07/2023 1121   PROT 6.4 (L) 02/07/2023 1121   ALBUMIN 3.1 (L) 02/07/2023 1121   AST 33 02/07/2023 1121   ALT 47 (H) 02/07/2023 1121   ALKPHOS 74 02/07/2023 1121   BILITOT 0.3 02/07/2023 1121  GFRNONAA 56 (L) 02/07/2023 1121   GFRAA 39 (L) 09/19/2019 0430   Lab Results  Component Value Date   LIPASE 133 (H) 02/07/2023  CBG (last 3)  Recent Labs    02/07/23 1050 02/07/23 1223 02/07/23 1342  GLUCAP 106* 59* 62*     Imaging: DG Chest Port 1 View  Result Date: 02/07/2023 CLINICAL DATA:  Cough EXAM: PORTABLE CHEST 1 VIEW COMPARISON:  01/04/2023 FINDINGS: Mild cardiomegaly. Implantable loop recorder. Both lungs are clear. The visualized skeletal structures are unremarkable. IMPRESSION: Mild cardiomegaly without acute abnormality of the lungs in AP portable projection. Electronically Signed   By: Jearld Lesch M.D.   On: 02/07/2023 12:11     EKG: personally reviewed my interpretation is regular rate of 60 bpm, regular sinus rhythm, normal QRS axis, PR interval 190, QRS duration 93, QT interval 437.   EKG Interpretation Date/Time:  Saturday February 07 2023 11:02:23 EDT Ventricular Rate:  60 PR Interval:  190 QRS Duration:  93 QT Interval:  437 QTC Calculation: 437 R Axis:   32  Text Interpretation: Sinus rhythm No significant change since last tracing Confirmed by Elayne Snare ([id number]) on 02/07/2023 11:25:16 AM   ASSESSMENT &  PLAN:    Assessment & Plan by Problem: Principal Problem:   Hypoglycemia   [name] is a 72 y.o. person living with a history of Diabetes with frequent hypoglycemia, Dementia, Arthritis, HTN, HLD, and GERD, who presented with Hypoglycemia and confusion and admitted for diabetes management/sugar control on hospital day 0  # Diabetes # Hypoglycemia  -Initiate regular blood glucose monitoring/CBG - Holding home diabetes med of Amaryl as it is likely contributing to the patients hypoglycemia episodes - Ua to rule out urinary tract infection f /u results  - f/u cbc   Chronic Problems:  # GERD - Continue with home medications protonix  # Arthritis  - Continue with mobic 15mg   # Dementia - Continue with home med donepezil  # HLD - Continue home Lipitor    # Thrombocytopenia  - Chronic, at 80 currently.  Diet: Normal VTE: DOAC IVF: NS, Code: Full  Prior to Admission Living Arrangement: Home Anticipated Discharge Location: Home Barriers to Discharge: Pending further workup and evaluation of recurrent hypoglycemia  Dispo: Admit patient to Inpatient with expected length of stay greater than 2 midnights.  Signed: Martie Round, Medical Student  02/07/2023, 1:30 PM   Attestation for Student Documentation:  I personally was present and re-performed the history, physical exam and medical decision-making activities of this service and have verified that the service and findings are accurately documented in the student's note.  Olegario Messier, MD 02/07/2023, 2:31 PM

## 2023-02-07 NOTE — ED Triage Notes (Signed)
Per EMS and pt report, pt's son took her blood sugar this morning and it was 38. Pt was disoriented and not answering questions. Pt was given OJ and it brought it up to 49. EMS gave her D10 and brought it up to 120. Pt has been sick for a week with vomiting, diarrhea, runny nose, and chills. Pt is A&O x4 during triage.

## 2023-02-07 NOTE — ED Provider Notes (Signed)
[facility] AT [facility] Provider Note   CSN: [id number] Arrival date & time: 02/07/23  1036     History  Chief Complaint  Patient presents with  . Hypoglycemia  . Altered Mental Status    [name] is a 72 y.o. female.  Patient is a 72 year old female with a past medical history of diabetes and hypertension presenting to the emergency department with hypoglycemia.  The patient states for the last 3 days she has had a viral GI bug with nausea, diarrhea and decreased appetite.  She reports mildly runny nose.  She states that she has been taking her glimepiride as prescribed and has been checking her blood sugars regularly.  She states that it was around 200 before going to bed last night.  She states that she woke up this morning to the medics being at her house and told her that her blood sugar was very low.  She was given orange juice and D10 and route by EMS.  She denies any recent fever or abdominal pain, dysuria or hematuria.  She states that she has had a mild cough but denies significant shortness of breath.  The history is provided by the patient.  Hypoglycemia Associated symptoms: altered mental status   Altered Mental Status      Home Medications Prior to Admission medications   Medication Sig Start Date End Date Taking? Authorizing Provider  ACCU-CHEK AVIVA PLUS test strip USE BID FOR GLUCOSE TESTING 12/19/14   [provider]  albuterol (VENTOLIN HFA) 108 (90 Base) MCG/ACT inhaler 1 puff inhaled 1-4 times daily PRN for 30 days 02/17/17   [provider]  allopurinol (ZYLOPRIM) 100 MG tablet Take 1 tablet (100 mg total) by mouth daily. 07/12/19 06/28/21  Sherryll Burger, Pratik D, DO  aspirin 81 MG chewable tablet Chew 1 tablet (81 mg total) by mouth daily. 09/20/19   Simmons-Robinson, Makiera, MD  atorvastatin (LIPITOR) 20 MG tablet Take 20 mg by mouth daily. 05/03/19   [provider]  buprenorphine (BUTRANS) 5  MCG/HR PTWK Place 1 patch onto the skin once a week. 01/30/23   [provider]  cephALEXin (KEFLEX) 500 MG capsule Take 1 capsule (500 mg total) by mouth 4 (four) times daily. 01/04/23   Elayne Snare K, DO  cetirizine (ZYRTEC) 10 MG tablet Take 10 mg by mouth daily.    [provider]  clopidogrel (PLAVIX) 75 MG tablet Take 75 mg by mouth daily.    [provider]  diclofenac sodium (VOLTAREN) 1 % GEL Apply 2 g topically daily as needed (pain).  01/17/19   [provider]  donepezil (ARICEPT) 10 MG tablet TAKE 1 TABLET BY MOUTH AT BEDTIME 02/04/23   Micki Riley, MD  feeding supplement, GLUCERNA SHAKE, (GLUCERNA SHAKE) LIQD Take 237 mLs by mouth 3 (three) times daily between meals. Patient not taking: Reported on 08/01/2022 09/19/19   Simmons-Robinson, Tawanna Cooler, MD  ferrous sulfate 325 (65 FE) MG tablet Take 1 tablet (325 mg total) by mouth daily with breakfast. 07/12/19 06/28/21  Sherryll Burger, Pratik D, DO  folic acid (FOLVITE) 1 MG tablet Take 1 tablet (1 mg total) by mouth daily. 11/29/20   Micki Riley, MD  gabapentin (NEURONTIN) 300 MG capsule Take 300 mg by mouth 3 (three) times daily.    [provider]  glimepiride (AMARYL) 4 MG tablet Take 4 mg by mouth daily with breakfast.  Patient not taking: Reported on 08/01/2022 11/23/18   [provider]  LINZESS 145 MCG CAPS capsule Take 145 mcg by mouth daily. 02/04/23   [provider]  losartan (COZAAR) 25 MG tablet Take 1 tablet (25 mg total) by mouth daily. 10/23/15   Vassie Loll, MD  meloxicam (MOBIC) 15 MG tablet Take 15 mg by mouth daily. 02/04/23   [provider]  MOUNJARO 5 MG/0.5ML Pen Inject 5 mg into the skin once a week. 12/31/22   [provider]  omeprazole (PRILOSEC) 20 MG capsule Take 20 mg by mouth daily.  10/05/14   [provider]  oxyCODONE-acetaminophen (PERCOCET) 10-325 MG tablet Take 1 tablet by mouth 4 (four) times daily as needed.    [provider]  pantoprazole (PROTONIX) 20 MG tablet Take 20 mg by mouth daily. 02/04/23   [provider]  prednisoLONE acetate (PRED FORTE) 1 % ophthalmic suspension SMARTSIG:In Eye(s) 08/19/22   [provider]  SUMAtriptan (IMITREX) 50 MG tablet Take 50 mg by mouth daily as needed for migraine or headache. 11/17/22   [provider]  tiZANidine (ZANAFLEX) 4 MG tablet Take 4 mg by mouth 2 (two) times daily as needed.    [provider]  topiramate (TOPAMAX) 100 MG tablet Take 1.5 tablets (150 mg total) by mouth 2 (two) times daily. 09/29/22   Micki Riley, MD  traZODone (DESYREL) 50 MG tablet Take 50 mg by mouth at bedtime.    [provider]  Vitamin D, Ergocalciferol, (DRISDOL) 1.25 MG (50000 UNIT) CAPS capsule Take 50,000 Units by mouth every other day. 01/12/23   [provider]      Allergies    Patient has no known allergies.    Review of Systems   Review of Systems  Physical Exam Updated Vital Signs BP (!) 91/28   Pulse (!) 59   Temp 98.2 F (36.8 C) (Oral)   Resp 16   Ht 5\' 3"  (1.6 m)   Wt 65.8 kg   SpO2 100%   BMI 25.69 kg/m  Physical Exam Vitals and nursing note reviewed.  Constitutional:      General: She is not in acute distress.    Appearance: Normal appearance.  HENT:     Head: Normocephalic and atraumatic.     Nose: Nose normal.     Mouth/Throat:     Mouth: Mucous membranes are moist.     Pharynx: Oropharynx is clear.  Eyes:     Extraocular Movements: Extraocular movements intact.     Conjunctiva/sclera: Conjunctivae normal.  Cardiovascular:     Rate and Rhythm: Normal rate and regular rhythm.     Heart sounds: Normal heart sounds.  Pulmonary:     Effort: Pulmonary effort is normal.     Breath sounds: Normal breath sounds.  Abdominal:     General: Abdomen is flat.     Palpations: Abdomen is soft.     Tenderness: There is no abdominal tenderness.  Musculoskeletal:        General: Normal range of  motion.     Cervical back: Normal range of motion.  Skin:    General: Skin is warm and dry.  Neurological:     General: No focal deficit present.     Mental Status: She is alert and oriented to person, place, and time.  Psychiatric:        Mood and Affect: Mood normal.        Behavior: Behavior normal.     ED Results / Procedures / Treatments   Labs (all labs  ordered are listed, but only abnormal results are displayed) Labs Reviewed  COMPREHENSIVE METABOLIC PANEL - Abnormal; Notable for the following components:      Result Value   Chloride 114 (*)    CO2 18 (*)    Glucose, Bld 116 (*)    BUN 26 (*)    Creatinine, Ser 1.07 (*)    Calcium 8.5 (*)    Total Protein 6.4 (*)    Albumin 3.1 (*)    ALT 47 (*)    GFR, Estimated 56 (*)    All other components within normal limits  LIPASE, BLOOD - Abnormal; Notable for the following components:   Lipase 133 (*)    All other components within normal limits  CBG MONITORING, ED - Abnormal; Notable for the following components:   Glucose-Capillary 106 (*)    All other components within normal limits  CBG MONITORING, ED - Abnormal; Notable for the following components:   Glucose-Capillary 59 (*)    All other components within normal limits  SARS CORONAVIRUS 2 BY RT PCR  CBC WITH DIFFERENTIAL/PLATELET  URINALYSIS, ROUTINE W REFLEX MICROSCOPIC  CBC WITH DIFFERENTIAL/PLATELET  CBG MONITORING, ED  CBG MONITORING, ED    EKG EKG Interpretation Date/Time:  Saturday February 07 2023 11:02:23 EDT Ventricular Rate:  60 PR Interval:  190 QRS Duration:  93 QT Interval:  437 QTC Calculation: 437 R Axis:   32  Text Interpretation: Sinus rhythm No significant change since last tracing Confirmed by Elayne Snare ([id number]) on 02/07/2023 11:25:16 AM  Radiology DG Chest Port 1 View  Result Date: 02/07/2023 CLINICAL DATA:  Cough EXAM: PORTABLE CHEST 1 VIEW COMPARISON:  01/04/2023 FINDINGS: Mild cardiomegaly. Implantable loop recorder. Both  lungs are clear. The visualized skeletal structures are unremarkable. IMPRESSION: Mild cardiomegaly without acute abnormality of the lungs in AP portable projection. Electronically Signed   By: Jearld Lesch M.D.   On: 02/07/2023 12:11    Procedures .Critical Care  Performed by: Rexford Maus, DO Authorized by: Rexford Maus, DO   Critical care provider statement:    Critical care time (minutes):  35   Critical care time was exclusive of:  Separately billable procedures and treating other patients   Critical care was necessary to treat or prevent imminent or life-threatening deterioration of the following conditions:  Endocrine crisis   Critical care was time spent personally by me on the following activities:  Development of treatment plan with patient or surrogate, discussions with consultants, evaluation of patient's response to treatment, obtaining history from patient or surrogate, ordering and performing treatments and interventions, ordering and review of laboratory studies, ordering and review of radiographic studies, pulse oximetry, re-evaluation of patient's condition and review of old charts   I assumed direction of critical care for this patient from another provider in my specialty: no     Care discussed with: admitting provider       Medications Ordered in ED Medications  sodium chloride flush (NS) 0.9 % injection 3 mL (3 mLs Intravenous Given 02/07/23 1116)  sodium chloride flush (NS) 0.9 % injection 3 mL (has no administration in time range)  0.9 %  sodium chloride infusion (has no administration in time range)  ondansetron (ZOFRAN) injection 4 mg (4 mg Intravenous Given 02/07/23 1232)  lactated ringers bolus 1,000 mL (1,000 mLs Intravenous New Bag/Given 02/07/23 1232)    ED Course/ Medical Decision Making/ A&P Clinical Course as of 02/07/23 1256  Sat Feb 07, 2023  1211 Mildly elevated lipase,  below limit for pancreatitis. Low bicarb likely related to  dehydration from diarrheal illness and will be given IVF. [VK]  1226 Repeat glucose 59. Given juice and meal tray.  [VK]  1252 Due to recent GI illness and decrease oral intake recommended admission for observation and symptomatic management. [VK]  1255 Patient signed out to internal medicine teaching team. [VK]    Clinical Course User Index [VK] Rexford Maus, DO                                 Medical Decision Making This patient presents to the ED with chief complaint(s) of hypoglycemia with pertinent past medical history of diabetes, hypertension which further complicates the presenting complaint. The complaint involves an extensive differential diagnosis and also carries with it a high risk of complications and morbidity.    The differential diagnosis includes hypoglycemia likely from taking her medications and not eating regularly, dehydration, electrolyte abnormality, gastroenteritis, hepatitis, pancreatitis, anemia, other infection  Additional history obtained: Additional history obtained from EMS  Records reviewed outpatient neurology records - mild cognitive impairment  ED Course and Reassessment: On patient's arrival she is hemodynamically stable in no acute distress at her neurologic baseline.  Accu-Chek on arrival here was 106.  The patient's EKG on arrival showed normal sinus rhythm without acute ischemic changes.  She will have labs performed to evaluate for infection, dehydration or electrolyte derangement as cause of her symptoms and she will continue to have every hour Accu-Cheks to closely assess for recurrent episodes of hypoglycemia.  Independent labs interpretation:  The following labs were independently interpreted: hypoglycemia, mildly elevated lipase, low bicarb likely in the setting of dehydration  Independent visualization of imaging: - I independently visualized the following imaging with scope of interpretation limited to determining acute life  threatening conditions related to emergency care: CXR, which revealed no acute disease  Consultation: - Consulted or discussed management/test interpretation w/ external professional: hospitalist  Consideration for admission or further workup: patient requires admission for hypoglycemia in the setting of GI illness Social Determinants of health: N/A    Amount and/or Complexity of Data Reviewed Labs: ordered. Radiology: ordered.  Risk Prescription drug management. Decision regarding hospitalization.          Final Clinical Impression(s) / ED Diagnoses Final diagnoses:  Hypoglycemia  Gastroenteritis    Rx / DC Orders ED Discharge Orders     None         Rexford Maus, DO 02/07/23 1252

## 2023-02-07 NOTE — Hospital Course (Addendum)
   Diabetes # Hypoglycemia  Patient presenting with 2 to 3 days of GI illness, and altered mental status.  She was hypoglycemic, BG 39. She was treated with IV fluids, D50. We also held  Amaryl as it is likely contributing to the patients hypoglycemia episodes.  Labs including chest x-ray, and UA are all normal.  Morning cortisol decreased. Suspect hypoglycemia is multifactorial, from decreased intake from the GI illness and Amaryl which is known to cause hypoglycemic episodes.  On the day of discharge blood glucose stable. -Follow-up with PCP.    Chronic Problems:   # GERD -Continued home meds.   # Arthritis  -Continued mobic 15mg    # Dementia -Continue donepezil   # HLD -Continue with Lipitor

## 2023-02-07 NOTE — ED Notes (Signed)
ED TO INPATIENT HANDOFF REPORT  ED Nurse Name and Phone #: Vernona Rieger [phone]  S Name/Age/Gender [name] 72 y.o. female Room/Bed: 027C/027C  Code Status   Code Status: Full Code  Home/SNF/Other Home Patient oriented to: self, place, time, and situation Is this baseline? Yes   Triage Complete: Triage complete  Chief Complaint Hypoglycemia [E16.2]  Triage Note Per EMS and pt report, pt's son took her blood sugar this morning and it was 38. Pt was disoriented and not answering questions. Pt was given OJ and it brought it up to 49. EMS gave her D10 and brought it up to 120. Pt has been sick for a week with vomiting, diarrhea, runny nose, and chills. Pt is A&O x4 during triage.    Allergies No Known Allergies  Level of Care/Admitting Diagnosis ED Disposition     ED Disposition  Admit   Condition  --   Comment  Hospital Area: [facility] [[id number]]  Level of Care: Med-Surg [16]  May place patient in observation at [facility] or [facility] if equivalent level of care is available:: No  Covid Evaluation: Asymptomatic - no recent exposure (last 10 days) testing not required  Diagnosis: Hypoglycemia [119147]  Admitting Physician: Gust Rung [[id number]]  Attending Physician: Gust Rung [[id number]]          B Medical/Surgery History Past Medical History:  Diagnosis Date   Arthritis    Diabetes mellitus without complication (HCC)    GERD (gastroesophageal reflux disease)    H/O hiatal hernia    Headache(784.0)    Hyperlipidemia    Hypertension    Neuromuscular disorder (HCC)    neuropathy feet    Sleep apnea    recently diagnosed- wears cpap   Stroke (HCC) 06/2019   Past Surgical History:  Procedure Laterality Date   ABDOMINAL HYSTERECTOMY     partial   BACK SURGERY     BUBBLE STUDY  07/11/2019   Procedure: BUBBLE STUDY;  Surgeon: Orpah Cobb, MD;  Location: [facility] ENDOSCOPY;  Service: Cardiovascular;;   CHOLECYSTECTOMY      COLONOSCOPY     age 65- doesnt know where or who    implantable loop recorder placement  10/26/2019   [employer] Reveal [place] model [id number] ([id number]) implantable loop recorder implanted by Dr Johney Frame for cryptogenic stroke   left shoulder     x 2 surgeries   right shoulder     x2   SHOULDER OPEN ROTATOR CUFF REPAIR Right 08/25/2012   Procedure: ROTATOR CUFF REPAIR SHOULDER OPEN WITH ACROMIOPLASTY;  Surgeon: Jacki Cones, MD;  Location: [facility] ORS;  Service: Orthopedics;  Laterality: Right;   TEE WITHOUT CARDIOVERSION N/A 07/11/2019   Procedure: TRANSESOPHAGEAL ECHOCARDIOGRAM (TEE);  Surgeon: Orpah Cobb, MD;  Location: [facility] ENDOSCOPY;  Service: Cardiovascular;  Laterality: N/A;   TONSILLECTOMY       A IV Location/Drains/Wounds Patient Lines/Drains/Airways Status     Active Line/Drains/Airways     Name Placement date Placement time Site Days   Peripheral IV 02/07/23 18 G Anterior;Left;Proximal Forearm 02/07/23  1045  Forearm  less than 1            Intake/Output Last 24 hours  Intake/Output Summary (Last 24 hours) at 02/07/2023 1326 Last data filed at 02/07/2023 1045 Gross per 24 hour  Intake 500 ml  Output --  Net 500 ml    Labs/Imaging Results for orders placed or performed during the hospital encounter of 02/07/23 (from the past 48 hour(s))  CBG monitoring, ED     Status: Abnormal   Collection Time: 02/07/23 10:50 AM  Result Value Ref Range   Glucose-Capillary 106 (H) 70 - 99 mg/dL    Comment: Glucose reference range applies only to samples taken after fasting for at least 8 hours.  SARS Coronavirus 2 by RT PCR (hospital order, performed in [facility] hospital lab) *cepheid single result test* Anterior Nasal Swab     Status: None   Collection Time: 02/07/23 11:15 AM   Specimen: Anterior Nasal Swab  Result Value Ref Range   SARS Coronavirus 2 by RT PCR NEGATIVE NEGATIVE    Comment: Performed at [facility], [address], [place], [place] [address]   Comprehensive metabolic panel     Status: Abnormal   Collection Time: 02/07/23 11:21 AM  Result Value Ref Range   Sodium 139 135 - 145 mmol/L   Potassium 3.6 3.5 - 5.1 mmol/L   Chloride 114 (H) 98 - 111 mmol/L   CO2 18 (L) 22 - 32 mmol/L   Glucose, Bld 116 (H) 70 - 99 mg/dL    Comment: Glucose reference range applies only to samples taken after fasting for at least 8 hours.   BUN 26 (H) 8 - 23 mg/dL   Creatinine, Ser 1.91 (H) 0.44 - 1.00 mg/dL   Calcium 8.5 (L) 8.9 - 10.3 mg/dL   Total Protein 6.4 (L) 6.5 - 8.1 g/dL   Albumin 3.1 (L) 3.5 - 5.0 g/dL   AST 33 15 - 41 U/L   ALT 47 (H) 0 - 44 U/L   Alkaline Phosphatase 74 38 - 126 U/L   Total Bilirubin 0.3 0.3 - 1.2 mg/dL   GFR, Estimated 56 (L) >60 mL/min    Comment: (NOTE) Calculated using the CKD-EPI Creatinine Equation (2021)    Anion gap 7 5 - 15    Comment: Performed at [facility], [address] [place], [place] [address]  Lipase, blood     Status: Abnormal   Collection Time: 02/07/23 11:21 AM  Result Value Ref Range   Lipase 133 (H) 11 - 51 U/L    Comment: Performed at [facility], [address], [place], [place] [address]  CBG monitoring, ED (now and then every hour for 3 hours)     Status: Abnormal   Collection Time: 02/07/23 12:23 PM  Result Value Ref Range   Glucose-Capillary 59 (L) 70 - 99 mg/dL    Comment: Glucose reference range applies only to samples taken after fasting for at least 8 hours.   DG Chest Port 1 View  Result Date: 02/07/2023 CLINICAL DATA:  Cough EXAM: PORTABLE CHEST 1 VIEW COMPARISON:  01/04/2023 FINDINGS: Mild cardiomegaly. Implantable loop recorder. Both lungs are clear. The visualized skeletal structures are unremarkable. IMPRESSION: Mild cardiomegaly without acute abnormality of the lungs in AP portable projection. Electronically Signed   By: Jearld Lesch M.D.   On: 02/07/2023 12:11    Pending Labs Unresulted Labs (From admission, onward)     Start     Ordered   02/08/23  0500  Basic metabolic panel  Tomorrow morning,   R        02/07/23 1318   02/08/23 0500  CBC  Tomorrow morning,   R        02/07/23 1318   02/07/23 1317  HIV Antibody (routine testing w rflx)  (HIV Antibody (Routine testing w reflex) panel)  Once,   R  02/07/23 1318   02/07/23 1220  CBC with Differential/Platelet  Once,   STAT        02/07/23 1220   02/07/23 1107  CBC with Differential  Once,   STAT        02/07/23 1107   02/07/23 1107  Urinalysis, Routine w reflex microscopic -Urine, Clean Catch  Once,   URGENT       Question:  Specimen Source  Answer:  Urine, Clean Catch   02/07/23 1107            Vitals/Pain Today's Vitals   02/07/23 1130 02/07/23 1245 02/07/23 1300 02/07/23 1315  BP: (!) 91/28 (!) 139/109 139/78 (!) 147/64  Pulse: (!) 59 (!) 138 95 62  Resp: 16 15 19 11   Temp:      TempSrc:      SpO2: 100% 100% (!) 61% 100%  Weight:      Height:      PainSc:        Isolation Precautions Airborne and Contact precautions  Medications Medications  sodium chloride flush (NS) 0.9 % injection 3 mL (3 mLs Intravenous Given 02/07/23 1116)  sodium chloride flush (NS) 0.9 % injection 3 mL (has no administration in time range)  0.9 %  sodium chloride infusion (has no administration in time range)  rivaroxaban (XARELTO) tablet 10 mg (has no administration in time range)  ondansetron (ZOFRAN) injection 4 mg (4 mg Intravenous Given 02/07/23 1232)  lactated ringers bolus 1,000 mL (1,000 mLs Intravenous New Bag/Given 02/07/23 1232)    Mobility walks with person assist     Focused Assessments Hypoglycemia and unresponsiveness   R Recommendations: See Admitting Provider Note  Report given to:   Additional Notes:

## 2023-02-08 DIAGNOSIS — E08649 Diabetes mellitus due to underlying condition with hypoglycemia without coma: Secondary | ICD-10-CM | POA: Diagnosis not present

## 2023-02-08 DIAGNOSIS — Z7984 Long term (current) use of oral hypoglycemic drugs: Secondary | ICD-10-CM

## 2023-02-08 DIAGNOSIS — E11649 Type 2 diabetes mellitus with hypoglycemia without coma: Secondary | ICD-10-CM | POA: Diagnosis not present

## 2023-02-08 DIAGNOSIS — K529 Noninfective gastroenteritis and colitis, unspecified: Secondary | ICD-10-CM

## 2023-02-08 LAB — CBC
HCT: 34.3 % — ABNORMAL LOW (ref 36.0–46.0)
Hemoglobin: 11.1 g/dL — ABNORMAL LOW (ref 12.0–15.0)
MCH: 30.2 pg (ref 26.0–34.0)
MCHC: 32.4 g/dL (ref 30.0–36.0)
MCV: 93.2 fL (ref 80.0–100.0)
Platelets: 71 10*3/uL — ABNORMAL LOW (ref 150–400)
RBC: 3.68 MIL/uL — ABNORMAL LOW (ref 3.87–5.11)
RDW: 12.7 % (ref 11.5–15.5)
WBC: 3.9 10*3/uL — ABNORMAL LOW (ref 4.0–10.5)
nRBC: 0 % (ref 0.0–0.2)

## 2023-02-08 LAB — GLUCOSE, CAPILLARY
Glucose-Capillary: 115 mg/dL — ABNORMAL HIGH (ref 70–99)
Glucose-Capillary: 116 mg/dL — ABNORMAL HIGH (ref 70–99)
Glucose-Capillary: 118 mg/dL — ABNORMAL HIGH (ref 70–99)

## 2023-02-08 LAB — BASIC METABOLIC PANEL
Anion gap: 5 (ref 5–15)
BUN: 23 mg/dL (ref 8–23)
CO2: 18 mmol/L — ABNORMAL LOW (ref 22–32)
Calcium: 8 mg/dL — ABNORMAL LOW (ref 8.9–10.3)
Chloride: 112 mmol/L — ABNORMAL HIGH (ref 98–111)
Creatinine, Ser: 1.12 mg/dL — ABNORMAL HIGH (ref 0.44–1.00)
GFR, Estimated: 53 mL/min — ABNORMAL LOW (ref >60)
Glucose, Bld: 107 mg/dL — ABNORMAL HIGH (ref 70–99)
Potassium: 4.2 mmol/L (ref 3.5–5.1)
Sodium: 135 mmol/L (ref 135–145)

## 2023-02-08 LAB — CORTISOL-AM, BLOOD: Cortisol - AM: 5.4 ug/dL — ABNORMAL LOW (ref 6.7–22.6)

## 2023-02-08 MED ORDER — SUMATRIPTAN SUCCINATE 50 MG PO TABS: 50.0000 mg | ORAL_TABLET | Freq: Every day | ORAL | 0 refills | Status: AC | PRN

## 2023-02-08 NOTE — Progress Notes (Signed)
Patient discharged.  Removed PIV.  Reviewed discharge instructions, medications and follow up appts with patient.  Answered questions.  Gave patient copy of discharge instructions.  There were no new prescriptions.    No additional questions or concerns at this time.    Wheeled patient down to car.

## 2023-02-08 NOTE — Discharge Summary (Addendum)
Name: [name] MRN: [id number] DOB: 1950-07-08 72 y.o. PCP: Loura Back, NP  Date of Admission: 02/07/2023 10:36 AM Date of Discharge: 02/08/2023 Attending Physician: Dr. Cleda Daub  Discharge Diagnosis: Principal Problem:   Hypoglycemia  Acute metabolic encephalopathy due to hypoglycemia  Discharge Medications: Allergies as of 02/08/2023   No Known Allergies      Medication List     STOP taking these medications    glimepiride 4 MG tablet Commonly known as: AMARYL       TAKE these medications    Accu-Chek Aviva Plus test strip Generic drug: glucose blood USE BID FOR GLUCOSE TESTING   allopurinol 100 MG tablet Commonly known as: ZYLOPRIM Take 1 tablet (100 mg total) by mouth daily. What changed:  when to take this reasons to take this   aspirin 81 MG chewable tablet Chew 1 tablet (81 mg total) by mouth daily.   atorvastatin 20 MG tablet Commonly known as: LIPITOR Take 20 mg by mouth daily.   buprenorphine 5 MCG/HR Ptwk Commonly known as: BUTRANS Place 1 patch onto the skin once a week.   clopidogrel 75 MG tablet Commonly known as: PLAVIX Take 75 mg by mouth daily.   diclofenac sodium 1 % Gel Commonly known as: VOLTAREN Apply 2 g topically daily as needed (pain).   donepezil 10 MG tablet Commonly known as: ARICEPT TAKE 1 TABLET BY MOUTH AT BEDTIME What changed:  how much to take when to take this   feeding supplement (GLUCERNA SHAKE) Liqd Take 237 mLs by mouth 3 (three) times daily between meals.   ferrous sulfate 325 (65 FE) MG tablet Take 1 tablet (325 mg total) by mouth daily with breakfast.   folic acid 1 MG tablet Commonly known as: FOLVITE Take 1 tablet (1 mg total) by mouth daily.   gabapentin 300 MG capsule Commonly known as: NEURONTIN Take 300 mg by mouth 3 (three) times daily.   losartan 25 MG tablet Commonly known as: Cozaar Take 1 tablet (25 mg total) by mouth daily.   meloxicam 15 MG tablet Commonly  known as: MOBIC Take 15 mg by mouth daily as needed for pain.   Mounjaro 5 MG/0.5ML Pen Generic drug: tirzepatide Inject 5 mg into the skin once a week. Thursdays   oxyCODONE-acetaminophen 10-325 MG tablet Commonly known as: PERCOCET Take 1 tablet by mouth 4 (four) times daily as needed.   pantoprazole 20 MG tablet Commonly known as: PROTONIX Take 20 mg by mouth daily.   SUMAtriptan 50 MG tablet Commonly known as: IMITREX Take 1 tablet (50 mg total) by mouth daily as needed for migraine or headache.   tiZANidine 4 MG tablet Commonly known as: ZANAFLEX Take 4 mg by mouth 2 (two) times daily as needed for muscle spasms.   topiramate 100 MG tablet Commonly known as: TOPAMAX Take 1.5 tablets (150 mg total) by mouth 2 (two) times daily. What changed:  when to take this reasons to take this   traZODone 50 MG tablet Commonly known as: DESYREL Take 50 mg by mouth at bedtime as needed for sleep.   Ventolin HFA 108 (90 Base) MCG/ACT inhaler Generic drug: albuterol 1 puff inhaled 1-4 times daily PRN for 30 days   Vitamin D (Ergocalciferol) 1.25 MG (50000 UNIT) Caps capsule Commonly known as: DRISDOL Take 50,000 Units by mouth every 7 (seven) days. Saturdays       Disposition and follow-up:   [name] was discharged from [facility] in [place]  condition.  At the hospital follow up visit please address:  1.  Follow-up:  a.  Hypoglycemia -We held Amaryl.     2.  Labs / imaging needed at time of follow-up: [facility]  Hospital Course by problem list:   Diabetes # Hypoglycemia  Patient presenting with 2 to 3 days of GI illness, and altered mental status.  She was hypoglycemic, BG 39. She was treated with IV fluids, D50. We also held  Amaryl as it is likely contributing to the patients hypoglycemia episodes.  Labs including chest x-ray, and UA are all normal.  Morning cortisol decreased (indeterminate). Suspect hypoglycemia is multifactorial, from  decreased intake from the GI illness and Amaryl which is known to cause hypoglycemic episodes.  On the day of discharge blood glucose stable. -Follow-up with PCP.   Chronic Problems:   # GERD -Continued home meds.   # Arthritis  -Continued mobic 15mg    # Dementia -Continue donepezil   # HLD -Continue with Lipitor      Discharge Subjective: Patient evaluated at bedside this AM. She is able to tolerate diet, without nausea or vomiting.  Discharge Exam:   BP (!) 100/57 (BP Location: Left Arm)   Pulse 70   Temp 98.6 F (37 C) (Oral)   Resp 20   Ht 5\' 3"  (1.6 m)   Wt 65.8 kg   SpO2 100%   BMI 25.69 kg/m   Constitutional: Pleasant, well-appearing, not in acute distress.   Neck: supple Cardiovascular: regular rate and rhythm, no m/r/g Pulmonary/Chest: normal work of breathing on room air, lungs clear to auscultation bilaterally Abdominal: soft, non-tender, non-distended MSK: normal bulk and tone Skin: warm and dry Psych: Affect and mood appropriate.  Pertinent Labs, Studies, and Procedures:     Latest Ref Rng & Units 02/08/2023    7:15 AM 02/07/2023    2:31 PM 01/04/2023   12:03 PM  CBC  WBC 4.0 - 10.5 K/uL 3.9  2.8  3.5   Hemoglobin 12.0 - 15.0 g/dL 86.5  78.4  69.6   Hematocrit 36.0 - 46.0 % 34.3  37.6  41.8   Platelets 150 - 400 K/uL 71  80  126        Latest Ref Rng & Units 02/08/2023    7:15 AM 02/07/2023   11:21 AM 01/04/2023   12:03 PM  CMP  Glucose 70 - 99 mg/dL 295  284  132   BUN 8 - 23 mg/dL 23  26  26    Creatinine 0.44 - 1.00 mg/dL 4.40  1.02  7.25   Sodium 135 - 145 mmol/L 135  139  136   Potassium 3.5 - 5.1 mmol/L 4.2  3.6  4.5   Chloride 98 - 111 mmol/L 112  114  107   CO2 22 - 32 mmol/L 18  18  22    Calcium 8.9 - 10.3 mg/dL 8.0  8.5  9.0   Total Protein 6.5 - 8.1 g/dL  6.4  6.9   Total Bilirubin 0.3 - 1.2 mg/dL  0.3  0.8   Alkaline Phos 38 - 126 U/L  74  64   AST 15 - 41 U/L  33  14   ALT 0 - 44 U/L  47  20     DG Chest Port 1  View  Result Date: 02/07/2023 CLINICAL DATA:  Cough EXAM: PORTABLE CHEST 1 VIEW COMPARISON:  01/04/2023 FINDINGS: Mild cardiomegaly. Implantable loop recorder. Both lungs are clear. The visualized skeletal structures are unremarkable. IMPRESSION:  Mild cardiomegaly without acute abnormality of the lungs in AP portable projection. Electronically Signed   By: Jearld Lesch M.D.   On: 02/07/2023 12:11     Discharge Instructions: Discharge Instructions     Diet - low sodium heart healthy   Complete by: As directed    Diet - low sodium heart healthy   Complete by: As directed    Increase activity slowly   Complete by: As directed    Increase activity slowly   Complete by: As directed       Signed: Laretta Bolster, MD 02/08/2023, 10:01 AM   Pager: [phone]

## 2023-02-08 NOTE — Discharge Instructions (Addendum)
It was a pleasure taking care of you.  You were admitted due to having low blood glucose.  -Please continue holding Amaryl until you see your PCP.  -Continue Mounjaro injections weekly.  Please return to the hospital if you experience tremors, confusion or drowsiness.

## 2023-02-10 LAB — C-PEPTIDE: C-Peptide: 8.4 ng/mL — ABNORMAL HIGH (ref 1.1–4.4)

## 2023-02-12 NOTE — Progress Notes (Signed)
Carelink Summary Report / Loop Recorder 

## 2023-02-18 ENCOUNTER — Telehealth: Payer: Self-pay

## 2023-02-18 NOTE — Telephone Encounter (Addendum)
ILR alert for RRT reached 9/2 - route to triage LA, CVRS   LM to return call to discuss device at RRT.    Marked patient inactive in [facility] Cancelled Remote appts. Removed from [facility] Ordered return kit in [place].

## 2023-02-19 NOTE — Telephone Encounter (Signed)
Pt returned call and advised she would like to have device explanted. Advised pt I will forward to scheduling and someone will call to f/u.

## 2023-02-19 NOTE — Telephone Encounter (Signed)
Spoke to patients grandson Dorene Sorrow per [employer]. Advised ILR battery is no longer working and patient can leave device in or have removed. Advised if patient wants device explanted to call DC back. Voiced understanding.

## 2023-03-09 ENCOUNTER — Ambulatory Visit: Payer: 59

## 2023-04-13 ENCOUNTER — Ambulatory Visit: Payer: 59 | Admitting: Adult Health

## 2023-04-13 ENCOUNTER — Encounter: Payer: Self-pay | Admitting: Adult Health

## 2023-04-13 ENCOUNTER — Ambulatory Visit: Payer: 59

## 2023-04-13 NOTE — Progress Notes (Deleted)
[facility] [address] [place] [name] [address] [phone]       OFFICE FOLLOW UP VISIT NOTE  [name] Date of Birth:  03-24-1951 Medical Record Number:  [id number]   Referring MD: Fatima Sanger Reason for Referral: Memory loss HPI: Initial visit 09/22/2019:[name] is a 72 year old pleasant African-American lady seen today for initial office consultation visit for memory loss.  History is obtained from the patient and her granddaughter [name] who is accompanying her.  I personally reviewed referral notes, electronic medical records and pertinent imaging films in PACS.  She has past medical history of diabetes, hypertension, hyperlipidemia, sleep apnea and arthritis.  She has been having memory difficulties for over a year.  She forgets her appointments as well as her medications.  She often gets disoriented.  She has good days and bad days.  She lives alone but her granddaughter keeps a close eye on her and helps her.  She is off later needed more help.  She has not been driving.  She can cook somewhat but grandchildren have been helping.  She needs supervision for a bath.  She has not had any hallucinations, delusions, violence or unsafe behavior.  She does get irritable and upset easily.  She denies any headaches, gait difficulties, double vision, focal extremity weakness numbness.  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  Loop recorder insertion has not shown paroxysmal A. fib yet.  She remains on aspirin for stroke prevention which is tolerating well without bruising or bleeding.  Her blood pressures well controlled today it is 136/73.  She cannot tell me when last lipid profile was checked or what her last hemoglobin A1c was.  There is no family Struve Alzheimer's or dementia. Update 11/29/2020: She returns for follow-up after last  visit 2 months ago.  She continues to have memory difficulties but she states this may be getting better.  She has been perspiring and doing memory challenging activities.  She continues to live alone.  Her grandson helps her and checks on her daily.  She is still able to do most actives of daily living for herself.  She denies any delusions, hallucinations, agitation or unsafe behavior.  She had lab work done at last visit on 09/17/2020 which showed normal vitamin B12, TSH.  RPR was negative.  Homocystine was elevated at 20.9.  I had advised her to start taking folic acid but she has not done that yet.  LDL cholesterol was 58 mg percent and hemoglobin A1c was 6.9.  EEG done on 10/16/2020 showed mild generalized slowing which is nonspecific.  She has not been tried yet on medication like Aricept or Namenda but is willing to try. Update 08/20/2021 : Patient is referred back today by her nurse practitioner [name] for evaluation for bilateral foot and right hand paresthesias.  Patient states this has been going on for several years.  She does have a history of a lumbar spinal stenosis and had a previous MRI done few years ago which had shown mild spinal stenosis.  However she denies any significant back pain, radicular pain, symptoms of neurogenic claudication.  She has had no recent back injury or fall.  She complains of mostly tingling and numbness in her feet bilaterally right more than left and occasionally in the right hand as well.  No specific triggers.  She has been started  on gabapentin 303 times daily which she seems to be tolerating well but feels is not particularly helping.  She denies any difficulty with walking balance falls or injuries.  She does have diabetes but feels it is under good control and last hemoglobin A1c checked in January this year was satisfactory as per the patient but I do not have the results to share.  Patient was started by me on Aricept at last visit for cognitive impairment and seems to  be doing well on that.  She likes it as not having any side effects and she feels her memory is much improved and she is more independent in activities of daily living and can manage more by herself.  She still lives alone.  She denies any delusion, hallucinations, unsafe behavior.  She denies any nausea, vomiting, dizziness.  She denies any recurrent symptoms of strokes or TIAs.  She remains on aspirin which is tolerating well without bleeding or bruising.  She states her blood pressures under good control today it is 122/71.  She is tolerating her statin as well as diabetes medicines well. Update 09/29/2022 : She returns for follow-up after last visit a year ago.  Patient continues to have significant paresthesia of the [place] still bothersome.  She is on Topamax 100 mg twice daily which is tolerating well without any alterations in taste, weight loss or GI side effects.  She denies any significant trouble with gait balance is likely.  She continues to have mild short-term memory difficulties and remains on Aricept 10 mg daily which is tolerating well with no side effects.  Due to memory difficulties more or less unchanged.  He is tolerating aspirin well without bleeding or bruising.  She has had no recurrent stroke or TIA symptoms.  She states her blood pressure control is good today it is actually quite low at 80/45.  She states that her blood sugars  control is also quite good.  Mini-Mental status testing was not done today but she scored 3/3 on recall, 3/4 on clock drawing able to name 4 legs   Update 04/13/2023 [name]: Patient returns for follow-up visit.  Prior visit 6 months ago [name].         ROS:   14 system review of systems is positive for memory loss, forgetfulness, disorientation, numbness, tingling and all other systems negative PMH:  Past Medical History:  Diagnosis Date   Arthritis    Diabetes mellitus without complication (HCC)    GERD (gastroesophageal reflux disease)    H/O  hiatal hernia    Headache(784.0)    Hyperlipidemia    Hypertension    Neuromuscular disorder (HCC)    neuropathy feet    Sleep apnea    recently diagnosed- wears cpap   Stroke (HCC) 06/2019    Social History:  Social History   Socioeconomic History   Marital status: Divorced    Spouse name: Not on file   Number of children: 3   Years of education: Not on file   Highest education level: Not on file  Occupational History   Occupation: retired    Associate Professor: [employer]  Tobacco Use   Smoking status: Never   Smokeless tobacco: Never  Vaping Use   Vaping status: Never Used  Substance and Sexual Activity   Alcohol use: Yes    Alcohol/week: 0.0 standard drinks of alcohol    Comment: rarely   Drug use: No   Sexual activity: Not Currently  Other Topics Concern  Not on file  Social History Narrative   Lives alone   Right Handed   Drinks caffeine very seldomly   Social Determinants of Health   Financial Resource Strain: Not on file  Food Insecurity: No Food Insecurity (02/07/2023)   Hunger Vital Sign    Worried About Running Out of Food in the Last Year: Never true    Ran Out of Food in the Last Year: Never true  Transportation Needs: No Transportation Needs (02/07/2023)   PRAPARE - Administrator, Civil Service (Medical): No    Lack of Transportation (Non-Medical): No  Physical Activity: Not on file  Stress: Not on file  Social Connections: Unknown (03/25/2023)   [facility]   Social Network    Social Network: Not on file  Intimate Partner Violence: Unknown (03/25/2023)   Received from [facility]   HITS    Physically Hurt: Not on file    Insult or Talk Down To: Not on file    Threaten Physical Harm: Not on file    Scream or Curse: Not on file    Medications:   Current Outpatient Medications on File Prior to Visit  Medication Sig Dispense Refill   ACCU-CHEK AVIVA PLUS test strip USE BID FOR GLUCOSE TESTING  11   albuterol  (VENTOLIN HFA) 108 (90 Base) MCG/ACT inhaler 1 puff inhaled 1-4 times daily PRN for 30 days     allopurinol (ZYLOPRIM) 100 MG tablet Take 1 tablet (100 mg total) by mouth daily. (Patient taking differently: Take 100 mg by mouth daily as needed.) 30 tablet 0   aspirin 81 MG chewable tablet Chew 1 tablet (81 mg total) by mouth daily. 30 tablet 0   atorvastatin (LIPITOR) 20 MG tablet Take 20 mg by mouth daily.     buprenorphine (BUTRANS) 5 MCG/HR PTWK Place 1 patch onto the skin once a week.     clopidogrel (PLAVIX) 75 MG tablet Take 75 mg by mouth daily.     diclofenac sodium (VOLTAREN) 1 % GEL Apply 2 g topically daily as needed (pain).      donepezil (ARICEPT) 10 MG tablet TAKE 1 TABLET BY MOUTH AT BEDTIME (Patient taking differently: Take 5 mg by mouth 2 (two) times daily.) 30 tablet 5   feeding supplement, GLUCERNA SHAKE, (GLUCERNA SHAKE) LIQD Take 237 mLs by mouth 3 (three) times daily between meals. 21330 mL 1   ferrous sulfate 325 (65 FE) MG tablet Take 1 tablet (325 mg total) by mouth daily with breakfast. 30 tablet 0   folic acid (FOLVITE) 1 MG tablet Take 1 tablet (1 mg total) by mouth daily. 90 tablet 1   gabapentin (NEURONTIN) 300 MG capsule Take 300 mg by mouth 3 (three) times daily.     losartan (COZAAR) 25 MG tablet Take 1 tablet (25 mg total) by mouth daily. 30 tablet 1   meloxicam (MOBIC) 15 MG tablet Take 15 mg by mouth daily as needed for pain.     MOUNJARO 5 MG/0.5ML Pen Inject 5 mg into the skin once a week. Thursdays     oxyCODONE-acetaminophen (PERCOCET) 10-325 MG tablet Take 1 tablet by mouth 4 (four) times daily as needed.     pantoprazole (PROTONIX) 20 MG tablet Take 20 mg by mouth daily.     SUMAtriptan (IMITREX) 50 MG tablet Take 1 tablet (50 mg total) by mouth daily as needed for migraine or headache. 10 tablet 0   tiZANidine (ZANAFLEX) 4 MG tablet Take 4 mg  by mouth 2 (two) times daily as needed for muscle spasms.     topiramate (TOPAMAX) 100 MG tablet Take 1.5 tablets  (150 mg total) by mouth 2 (two) times daily. (Patient taking differently: Take 150 mg by mouth daily as needed (headache).) 90 tablet 11   traZODone (DESYREL) 50 MG tablet Take 50 mg by mouth at bedtime as needed for sleep.     Vitamin D, Ergocalciferol, (DRISDOL) 1.25 MG (50000 UNIT) CAPS capsule Take 50,000 Units by mouth every 7 (seven) days. Saturdays     No current facility-administered medications on file prior to visit.    Allergies:  No Known Allergies  Physical Exam General: Pleasant middle-aged African-American lady, seated, in no evident distress Head: head normocephalic and atraumatic.   Neck: supple with no carotid or supraclavicular bruits Cardiovascular: regular rate and rhythm, no murmurs Musculoskeletal: no deformity Skin:  no rash/petichiae Vascular:  Normal pulses all extremities  Neurologic Exam Mental Status: Awake and fully alert. Oriented to place and time. Recent and remote memory diminished. Attention span, concentration and fund of knowledge appropriate. Mood and affect appropriate.  Mini-Mental status exam not done today.  (Last visit score 25/30 .)  Intact recall 3/3.Marland Kitchen  Able to name only 9 animals which can walk on 4 legs.  Clock drawing 3/4.   Cranial Nerves: Fundoscopic exam  not done. Pupils equal, briskly reactive to light. Extraocular movements full without nystagmus. Visual fields full to confrontation. Hearing intact. Facial sensation intact. Face, tongue, palate moves normally and symmetrically.  Motor: Normal bulk and tone. Normal strength in all tested extremity muscles. Sensory.:  Hyperesthesia to touch and pinprick from ankle down bilaterally.  Position sense is normal.  Impaired vibration over toes bilaterally.  Negative Romberg sign Coordination: Rapid alternating movements normal in all extremities. Finger-to-nose and heel-to-shin performed accurately bilaterally. Gait and Station: Arises from chair without difficulty. Stance is normal. Gait  demonstrates normal stride length and balance . Able to heel, toe and tandem walk without difficulty.  Reflexes: 1+ and symmetric. Toes downgoing.        08/20/2021    8:21 AM 11/29/2020   11:47 AM 09/17/2020   10:47 AM  MMSE - Mini Mental State Exam  Orientation to time 5 4 5   Orientation to Place 5 4 5   Registration 3 3 3   Attention/ Calculation 1 1 2   Recall 3 2 3   Language- name 2 objects 2 2 2   Language- repeat 1 0 0  Language- follow 3 step command 3 2 3   Language- read & follow direction 1 1 1   Write a sentence 1 1 1   Copy design 0 1 1  Total score 25 21 26       ASSESSMENT/PLAN: 72 year old lady with subacute progressive memory and cognitive worsening over the last 1 year likely due to mild cognitive impairment.  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  Vascular risk factors of diabetes, hyperlipidemia, hypertension, mild hyper homocystinemia and obesity.  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  Discussed importance of managing stroke risk factors  History of stroke Continue aspirin and atorvastatin for secondary stroke prevention measures managed/prescribed by PCP Continue close PCP follow-up for aggressive stroke risk factor management including BP goal<130/90, HLD with LDL goal<70 and DM with A1c.<7      PLAN: I had a long discussion with the patient  regarding her new complaints of bilateral foot and right hand paresthesias which likely are due to small fiber neuropathy from her diabetes.  I recommend she increase the dose of Topamax to 150 mg twice daily to help with paresthesias.  Continue gabapentin  in the current dose of 300 mg thrice daily.  Strict control of diabetes with hemoglobin A1c goal below 6.5%.  Continue Aricept and the current dose of 10 mg daily for her memory loss and cognitive impairment which appears stable.  She was also encouraged to increase participation in cognitively challenging activities like solving crossword puzzles, playing bridge and sudoku.  Continue aspirin for stroke prevention with aggressive risk factor modification with strict control of hypertension with blood pressure goal below 130/90, lipids with LDL cholesterol goal below 70 mg percent and diabetes with hemoglobin A1c goal below 6.5% return for follow-up in the future in 6 months with nurse practitioner or call earlier if necessaryshe will return for follow-up in the future in 3 months or call earlier if necessary.   I spent *** minutes of face-to-face and non-face-to-face time with patient.  This included previsit chart review, lab review, study review, order entry, electronic health record documentation, patient education and discussion regarding above diagnoses and treatment plan and answered all other questions to patient's satisfaction  Ihor Austin, [facility]  [facility] [address] [place], [place] [address]  Phone [phone] Fax [phone] Note: This document was prepared with digital dictation and possible smart phrase technology. Any transcriptional errors that result from this process are unintentional.

## 2023-05-18 ENCOUNTER — Ambulatory Visit: Payer: 59

## 2023-06-05 ENCOUNTER — Encounter: Payer: Self-pay | Admitting: Pulmonary Disease

## 2023-06-08 ENCOUNTER — Ambulatory Visit: Payer: 59 | Admitting: Pulmonary Disease

## 2023-06-15 NOTE — Progress Notes (Deleted)
  Electrophysiology Office Follow up Visit Note:    Date:  06/15/2023   ID:  [name], DOB January 03, 1951, MRN [id number]  PCP:  Leontine Cramp, NP  [facility] Cardiologist:  None  [facility] Electrophysiologist:  Whitney ONEIDA HOLTS, MD    Interval History:     [name] is a 72 y.o. female who presents for a follow up visit.   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  She was previously followed by Dr. Kelsie.  Her loop recorder is now at [facility] and she wishes to have the device removed.        Past medical, surgical, social and family history were reviewed.  ROS:   Please see the history of present illness.    All other systems reviewed and are negative.  EKGs/Labs/Other Studies Reviewed:    The following studies were reviewed today:          Physical Exam:    VS:  There were no vitals taken for this visit.    Wt Readings from Last 3 Encounters:  02/07/23 145 lb (65.8 kg)  01/04/23 160 lb 0.9 oz (72.6 kg)  10/08/22 160 lb 0.9 oz (72.6 kg)     GEN: no distress CARD: RRR, No MRG RESP: No IWOB. CTAB.      ASSESSMENT:    No diagnosis found. PLAN:    In order of problems listed above:  #Cryptogenic stroke #Loop recorder at [facility] The patient presents for loop recorder explant.  I discussed the procedure in detail including the risks and she wished to proceed.   Signed, Whitney Holts, MD, [facility], [facility] 06/15/2023 9:06 PM    Electrophysiology [facility]  -------------------  SURGEON:  Whitney Holts, MD    PREPROCEDURE DIAGNOSIS: Crypto genic stroke    POSTPROCEDURE DIAGNOSIS: Cryptogenic stroke     PROCEDURES:   1. Implantable loop recorder explantation    INTRODUCTION:  [name] is a 71 y.o. female with a history of cryptogenic stroke who presents today for implantable loop explantation.  The patient previously had a loop recorder implanted.   The device has reached end of service time.  The patient therefore presents today for implantable loop explantation.     DESCRIPTION OF PROCEDURE:  Informed written consent was obtained.  A preprocedure timeout was performed today.  The patient required no sedation for the procedure today.   The patients left chest was therefore prepped and draped in the usual sterile fashion.  The skin overlying the ILR monitor was infiltrated with lidocaine  for local analgesia.  A 0.5-cm incision was made over the site.  The previously implanted ILR was exposed and removed using a combination of sharp and blunt dissection.  Steri- Strips and a sterile dressing were then applied. EBL<43ml.  There were no early apparent complications.     CONCLUSIONS:   1. Successful explantation of an implantable loop recorder   2. No early apparent complications.        Whitney Holts, MD 06/15/2023 9:07 PM

## 2023-06-16 ENCOUNTER — Ambulatory Visit: Payer: 59 | Attending: Cardiology | Admitting: Cardiology

## 2023-06-16 DIAGNOSIS — I639 Cerebral infarction, unspecified: Secondary | ICD-10-CM

## 2023-06-16 DIAGNOSIS — Z4509 Encounter for adjustment and management of other cardiac device: Secondary | ICD-10-CM

## 2023-06-18 ENCOUNTER — Encounter: Payer: Self-pay | Admitting: Cardiology

## 2023-06-22 ENCOUNTER — Ambulatory Visit: Payer: 59

## 2023-07-02 ENCOUNTER — Encounter (HOSPITAL_COMMUNITY): Payer: Self-pay

## 2023-07-02 ENCOUNTER — Emergency Department (HOSPITAL_COMMUNITY)
Admission: EM | Admit: 2023-07-02 | Discharge: 2023-07-02 | Disposition: A | Discharge status: Home / Self Care | Payer: 59 | Attending: Emergency Medicine | Admitting: Emergency Medicine

## 2023-07-02 ENCOUNTER — Other Ambulatory Visit: Payer: Self-pay

## 2023-07-02 ENCOUNTER — Emergency Department (HOSPITAL_COMMUNITY): Payer: 59

## 2023-07-02 DIAGNOSIS — E119 Type 2 diabetes mellitus without complications: Secondary | ICD-10-CM | POA: Diagnosis not present

## 2023-07-02 DIAGNOSIS — S0232XA Fracture of orbital floor, left side, initial encounter for closed fracture: Secondary | ICD-10-CM | POA: Insufficient documentation

## 2023-07-02 DIAGNOSIS — S0502XA Injury of conjunctiva and corneal abrasion without foreign body, left eye, initial encounter: Secondary | ICD-10-CM | POA: Diagnosis not present

## 2023-07-02 DIAGNOSIS — I10 Essential (primary) hypertension: Secondary | ICD-10-CM | POA: Insufficient documentation

## 2023-07-02 DIAGNOSIS — W01198A Fall on same level from slipping, tripping and stumbling with subsequent striking against other object, initial encounter: Secondary | ICD-10-CM | POA: Diagnosis not present

## 2023-07-02 DIAGNOSIS — S058X2A Other injuries of left eye and orbit, initial encounter: Secondary | ICD-10-CM

## 2023-07-02 DIAGNOSIS — S0993XA Unspecified injury of face, initial encounter: Secondary | ICD-10-CM | POA: Diagnosis present

## 2023-07-02 DIAGNOSIS — S01112A Laceration without foreign body of left eyelid and periocular area, initial encounter: Secondary | ICD-10-CM | POA: Diagnosis not present

## 2023-07-02 DIAGNOSIS — Z7901 Long term (current) use of anticoagulants: Secondary | ICD-10-CM | POA: Diagnosis not present

## 2023-07-02 DIAGNOSIS — Z23 Encounter for immunization: Secondary | ICD-10-CM | POA: Diagnosis not present

## 2023-07-02 DIAGNOSIS — Z7982 Long term (current) use of aspirin: Secondary | ICD-10-CM | POA: Insufficient documentation

## 2023-07-02 DIAGNOSIS — W19XXXA Unspecified fall, initial encounter: Secondary | ICD-10-CM

## 2023-07-02 MED ORDER — TETANUS-DIPHTH-ACELL PERTUSSIS 5-2.5-18.5 LF-MCG/0.5 IM SUSY
0.5000 mL | PREFILLED_SYRINGE | Freq: Once | INTRAMUSCULAR | Status: AC
Start: 2023-07-02 — End: 2023-07-02
  Administered 2023-07-02: 0.5 mL via INTRAMUSCULAR
  Filled 2023-07-02: qty 0.5

## 2023-07-02 MED ORDER — ACETAMINOPHEN 500 MG PO TABS
1000.0000 mg | ORAL_TABLET | Freq: Once | ORAL | Status: AC
  Administered 2023-07-02: 1000 mg via ORAL
  Filled 2023-07-02: qty 2

## 2023-07-02 MED ORDER — BACITRACIN ZINC 500 UNIT/GM EX OINT
TOPICAL_OINTMENT | CUTANEOUS | Status: AC
  Filled 2023-07-02: qty 1.8

## 2023-07-02 MED ORDER — LIDOCAINE HCL (PF) 1 % IJ SOLN
10.0000 mL | Freq: Once | INTRAMUSCULAR | Status: DC
  Filled 2023-07-02: qty 30

## 2023-07-02 NOTE — Discharge Instructions (Addendum)
The CT scan showed that you have a fracture of the bottom part of your left eye socket.  This should heal with time.  DO NOT BLOW YOUR NOSE as this can cause further complications.   You must follow-up with one the ophthalmology offices listed below.  Please call as soon as possible to schedule an appointment.  Please follow-up with them within the next week.  The abrasion below your eye was covered with antibiotic ointment.  Please continue to keep it clean with soap and water at home.  Continue to apply antibiotic ointment to the area and cover with sterile gauze or Band-Aid until fully healed.  Your cut today above your eye was covered with skin glue to help it heal.  Please follow the instructions below:  Do not scratch, rub, or pick at the adhesive. Leave tissue adhesive in place. It will come off naturally after 7-10 days. Do not place tape over the adhesive. The adhesive could come off the wound when you pull the tape off. Do not take baths, swim, or use a hot tubs. You can shower after the first 24 hours. Cover the dressing with a watertight covering when you take a shower. Do not use any soaps, petroleum jelly products, or ointments on the wound. Certain ointments can weaken the adhesive.   Return the ER for any pus draining from the wound, increased redness around the wound, changes in vision, double vision any other new or concerning symptoms.

## 2023-07-02 NOTE — ED Triage Notes (Signed)
Patient brought in by EMS after falling and hitting the concrete. Reports bending over and fell, landing on her face. Denies any LOC, not on blood thinners. Laceration to the upper left eyebrow and cheek. No other complaints.

## 2023-07-02 NOTE — ED Provider Notes (Signed)
[place] EMERGENCY DEPARTMENT AT [facility] Provider Note   CSN: [id number] Arrival date & time: 07/02/23  1316     History  Chief Complaint  Patient presents with   [name] is a 73 y.o. female with history of type 2 diabetes, hypertension, presents with concern for hitting her left eye earlier in a fall.  States she was bending over to pick up a package and lost her balance, and then hit the concrete.  Denies any chest pain or dizziness before the fall.  Denies any loss of consciousness.  Patient is not on any blood thinners.  Denies any changes in vision or pain with eye movement.   Fall       Home Medications Prior to Admission medications   Medication Sig Start Date End Date Taking? Authorizing Provider  ACCU-CHEK AVIVA PLUS test strip USE BID FOR GLUCOSE TESTING 12/19/14   [provider]  albuterol (VENTOLIN HFA) 108 (90 Base) MCG/ACT inhaler 1 puff inhaled 1-4 times daily PRN for 30 days 02/17/17   [provider]  allopurinol (ZYLOPRIM) 100 MG tablet Take 1 tablet (100 mg total) by mouth daily. Patient taking differently: Take 100 mg by mouth daily as needed. 07/12/19 02/07/23  Sherryll Burger, Pratik D, DO  aspirin 81 MG chewable tablet Chew 1 tablet (81 mg total) by mouth daily. 09/20/19   Simmons-Robinson, Makiera, MD  atorvastatin (LIPITOR) 20 MG tablet Take 20 mg by mouth daily. 05/03/19   [provider]  buprenorphine (BUTRANS) 5 MCG/HR PTWK Place 1 patch onto the skin once a week. 01/30/23   [provider]  clopidogrel (PLAVIX) 75 MG tablet Take 75 mg by mouth daily.    [provider]  diclofenac sodium (VOLTAREN) 1 % GEL Apply 2 g topically daily as needed (pain).  01/17/19   [provider]  donepezil (ARICEPT) 10 MG tablet TAKE 1 TABLET BY MOUTH AT BEDTIME Patient taking differently: Take 5 mg by mouth 2 (two) times daily. 02/04/23   Micki Riley, MD  feeding supplement, GLUCERNA SHAKE,  (GLUCERNA SHAKE) LIQD Take 237 mLs by mouth 3 (three) times daily between meals. 09/19/19   Simmons-Robinson, Tawanna Cooler, MD  ferrous sulfate 325 (65 FE) MG tablet Take 1 tablet (325 mg total) by mouth daily with breakfast. 07/12/19 02/07/23  Sherryll Burger, Pratik D, DO  folic acid (FOLVITE) 1 MG tablet Take 1 tablet (1 mg total) by mouth daily. 11/29/20   Micki Riley, MD  gabapentin (NEURONTIN) 300 MG capsule Take 300 mg by mouth 3 (three) times daily.    [provider]  losartan (COZAAR) 25 MG tablet Take 1 tablet (25 mg total) by mouth daily. 10/23/15   Vassie Loll, MD  meloxicam (MOBIC) 15 MG tablet Take 15 mg by mouth daily as needed for pain. 02/04/23   [provider]  MOUNJARO 5 MG/0.5ML Pen Inject 5 mg into the skin once a week. Thursdays 12/31/22   [provider]  oxyCODONE-acetaminophen (PERCOCET) 10-325 MG tablet Take 1 tablet by mouth 4 (four) times daily as needed.    [provider]  pantoprazole (PROTONIX) 20 MG tablet Take 20 mg by mouth daily. 02/04/23   [provider]  SUMAtriptan (IMITREX) 50 MG tablet Take 1 tablet (50 mg total) by mouth daily as needed for migraine or headache. 02/08/23   Laretta Bolster, MD  tiZANidine (ZANAFLEX) 4 MG tablet Take 4 mg by mouth 2 (two) times daily as needed for  muscle spasms.    [provider]  topiramate (TOPAMAX) 100 MG tablet Take 1.5 tablets (150 mg total) by mouth 2 (two) times daily. Patient taking differently: Take 150 mg by mouth daily as needed (headache). 09/29/22   Micki Riley, MD  traZODone (DESYREL) 50 MG tablet Take 50 mg by mouth at bedtime as needed for sleep.    [provider]  Vitamin D, Ergocalciferol, (DRISDOL) 1.25 MG (50000 UNIT) CAPS capsule Take 50,000 Units by mouth every 7 (seven) days. Saturdays 01/12/23   [provider]      Allergies    Patient has no known allergies.    Review of Systems   Review of Systems  Skin:  Positive for wound.     Physical Exam Updated Vital Signs BP (!) 168/90   Pulse 75   Temp 97.8 F (36.6 C)   Resp 18   Ht 5\' 3"  (1.6 m)   Wt 65.8 kg   SpO2 100%   BMI 25.70 kg/m  Physical Exam Vitals and nursing note reviewed.  Constitutional:      Appearance: Normal appearance.  HENT:     Head: Atraumatic.     Comments: No tenderness to palpation of the skull diffusely, no step-offs    Mouth/Throat:     Comments: No missing or loose dentition Eyes:     Comments: Pupils equal and reactive bilaterally Extraocular movements intact bilaterally, no pain with EOM  Patient denies any changes in vision  Neck:     Comments: No tenderness palpation of the cervical spine Able to rotate neck which right 45 degrees without difficulty Pulmonary:     Effort: Pulmonary effort is normal.  Skin:    Comments: Edema of the left upper and lower eyelid diffusely, unable to open eyelid completely due to swelling  Skin tear inferolateral to the left eye  2cm linear abrasion under left eyebrow  Neurological:     General: No focal deficit present.     Mental Status: She is alert.  Psychiatric:        Mood and Affect: Mood normal.        Behavior: Behavior normal.       ED Results / Procedures / Treatments   Labs (all labs ordered are listed, but only abnormal results are displayed) Labs Reviewed - No data to display  EKG None  Radiology No results found.  Procedures .Laceration Repair  Date/Time: 07/02/2023 7:33 PM  Performed by: Arabella Merles, PA-C Authorized by: Arabella Merles, PA-C   Consent:    Consent obtained:  Verbal   Consent given by:  Patient   Risks, benefits, and alternatives were discussed: yes     Risks discussed:  Infection, pain and poor cosmetic result   Alternatives discussed:  No treatment Universal protocol:    Patient identity confirmed:  Verbally with patient Anesthesia:    Anesthesia method:  None Laceration details:    Location: Left upper eyelid.    Length (cm):  2 Pre-procedure details:    Preparation:  Imaging obtained to evaluate for foreign bodies Exploration:    Imaging outcome: foreign body not noted   Treatment:    Area cleansed with:  Soap and water   Irrigation solution:  Sterile water   Debridement:  None Skin repair:    Repair method:  Tissue adhesive (Dermabond) Repair type:    Repair type:  Simple Post-procedure details:    Dressing:  Open (no dressing)   Procedure completion:  Tolerated well,  no immediate complications     Medications Ordered in ED Medications  lidocaine (PF) (XYLOCAINE) 1 % injection 10 mL (10 mLs Infiltration Not Given 07/02/23 1808)  acetaminophen (TYLENOL) tablet 1,000 mg (1,000 mg Oral Given 07/02/23 1630)  Tdap (BOOSTRIX) injection 0.5 mL (0.5 mLs Intramuscular Given 07/02/23 1631)  bacitracin 500 UNIT/GM ointment (  Given by Other 07/02/23 1807)    ED Course/ Medical Decision Making/ A&P                                 Medical Decision Making Amount and/or Complexity of Data Reviewed Radiology: ordered.  Risk OTC drugs. Prescription drug management.     Differential diagnosis includes but is not limited to laceration, abrasion, orbital trauma, fracture, intracranial hemorrhage  ED Course:  Patient well-appearing, no acute distress.  Vital signs stable aside from an elevated blood pressure 168/98.  Story consistent with mechanical fall, low concern for any other etiology to fall at this time.  Given the head trauma, CT head, cervical spine, maxillofacial was obtained. I independently reviewed and interpreted the images which showed a mildly displaced orbital floor fracture of the left orbit.  No signs of entrapment.  Patient with intact extraocular movements.  I discussed this case with Dr. Freida Busman, we will have patient follow-up with ophthalmology within the next week. Patient had a skin tear underneath her eye.  This was covered with bacitracin and covered with sterile gauze.  Had  an abrasion to the left upper eyelid.  This did not warrant suture placement.  This was covered with Dermabond.   Patient tetanus was updated.   Given Tylenol for pain.  Patient stable appropriate for discharge home at this time    Impression: Left minimally displaced orbital floor fracture Skin tear under left eye Abrasion to the left upper eyelid  Disposition:  The patient was discharged home with instructions to follow-up with ophthalmology within the next week.  Their contact information was provided.  Patient was instructed to not blow her nose.  Tylenol and ibuprofen as needed for pain.  Keep the skin tears clean dry with soap and water, and apply antibiotic ointment to the area until fully healed.  Was instructed to allow Dermabond to fall off on its own and to not get the Dermabond wet. Return precautions given.            Final Clinical Impression(s) / ED Diagnoses Final diagnoses:  Closed fracture of left orbital floor, initial encounter [facility])  Fall, initial encounter  Abrasion of left eye, initial encounter    Rx / DC Orders ED Discharge Orders     None         Arabella Merles, Cordelia Poche 07/02/23 1934    Lorre Nick, MD 07/03/23 1810

## 2023-07-27 ENCOUNTER — Ambulatory Visit: Payer: 59

## 2023-07-28 ENCOUNTER — Telehealth: Payer: Self-pay | Admitting: *Deleted

## 2023-07-28 NOTE — Telephone Encounter (Signed)
Called and spoke with patient, she cancelled her appointment for 07/29/23, I rescheduled her PFT for 4/4 at 1 pm, advised to arrive by 12:45 pm for check in and her f/u with Dr. Wynona Neat on 09/23/23 at 9:45 am, advised to arrive by 9:30 am for check in.  Mailed appointment sheet with PFT instructions after verifying mailing address.

## 2023-07-29 ENCOUNTER — Ambulatory Visit: Payer: 59 | Admitting: Pulmonary Disease

## 2023-08-31 ENCOUNTER — Ambulatory Visit: Payer: 59

## 2023-09-01 ENCOUNTER — Other Ambulatory Visit: Payer: Self-pay | Admitting: Neurology

## 2023-09-02 NOTE — Telephone Encounter (Signed)
 Last seen on 09/29/22 per note " Return in about 6 months (around 03/31/2023). " No follow up scheduled

## 2023-09-23 ENCOUNTER — Ambulatory Visit: Payer: 59 | Admitting: Pulmonary Disease

## 2023-09-28 ENCOUNTER — Other Ambulatory Visit (HOSPITAL_BASED_OUTPATIENT_CLINIC_OR_DEPARTMENT_OTHER): Payer: Self-pay

## 2023-09-28 DIAGNOSIS — G4733 Obstructive sleep apnea (adult) (pediatric): Secondary | ICD-10-CM

## 2023-09-28 DIAGNOSIS — R0602 Shortness of breath: Secondary | ICD-10-CM

## 2023-09-29 ENCOUNTER — Encounter (HOSPITAL_BASED_OUTPATIENT_CLINIC_OR_DEPARTMENT_OTHER)

## 2023-10-05 ENCOUNTER — Ambulatory Visit: Payer: 59

## 2023-11-19 ENCOUNTER — Encounter: Payer: Self-pay | Admitting: Pulmonary Disease

## 2023-11-19 ENCOUNTER — Ambulatory Visit: Admitting: Pulmonary Disease

## 2024-01-12 ENCOUNTER — Ambulatory Visit: Admitting: Adult Health

## 2024-01-22 ENCOUNTER — Emergency Department (HOSPITAL_COMMUNITY)
Admission: EM | Admit: 2024-01-22 | Discharge: 2024-01-23 | Disposition: A | Discharge status: Home / Self Care | Attending: Emergency Medicine | Admitting: Emergency Medicine

## 2024-01-22 ENCOUNTER — Other Ambulatory Visit: Payer: Self-pay

## 2024-01-22 DIAGNOSIS — I10 Essential (primary) hypertension: Secondary | ICD-10-CM | POA: Insufficient documentation

## 2024-01-22 DIAGNOSIS — M25561 Pain in right knee: Secondary | ICD-10-CM | POA: Diagnosis not present

## 2024-01-22 DIAGNOSIS — W1830XA Fall on same level, unspecified, initial encounter: Secondary | ICD-10-CM | POA: Diagnosis not present

## 2024-01-22 DIAGNOSIS — Z7982 Long term (current) use of aspirin: Secondary | ICD-10-CM | POA: Insufficient documentation

## 2024-01-22 DIAGNOSIS — Z79899 Other long term (current) drug therapy: Secondary | ICD-10-CM | POA: Diagnosis not present

## 2024-01-22 DIAGNOSIS — D72829 Elevated white blood cell count, unspecified: Secondary | ICD-10-CM | POA: Diagnosis not present

## 2024-01-22 DIAGNOSIS — M545 Low back pain, unspecified: Secondary | ICD-10-CM | POA: Diagnosis not present

## 2024-01-22 DIAGNOSIS — Y92009 Unspecified place in unspecified non-institutional (private) residence as the place of occurrence of the external cause: Secondary | ICD-10-CM | POA: Insufficient documentation

## 2024-01-22 DIAGNOSIS — R531 Weakness: Secondary | ICD-10-CM

## 2024-01-22 DIAGNOSIS — E119 Type 2 diabetes mellitus without complications: Secondary | ICD-10-CM | POA: Insufficient documentation

## 2024-01-22 DIAGNOSIS — Z7902 Long term (current) use of antithrombotics/antiplatelets: Secondary | ICD-10-CM | POA: Insufficient documentation

## 2024-01-22 LAB — COMPREHENSIVE METABOLIC PANEL WITH GFR
ALT: 43 U/L (ref 0–44)
AST: 30 U/L (ref 15–41)
Albumin: 3.3 g/dL — ABNORMAL LOW (ref 3.5–5.0)
Alkaline Phosphatase: 86 U/L (ref 38–126)
Anion gap: 13 (ref 5–15)
BUN: 32 mg/dL — ABNORMAL HIGH (ref 8–23)
CO2: 20 mmol/L — ABNORMAL LOW (ref 22–32)
Calcium: 9.6 mg/dL (ref 8.9–10.3)
Chloride: 102 mmol/L (ref 98–111)
Creatinine, Ser: 1.5 mg/dL — ABNORMAL HIGH (ref 0.44–1.00)
GFR, Estimated: 37 mL/min — ABNORMAL LOW (ref >60)
Glucose, Bld: 248 mg/dL — ABNORMAL HIGH (ref 70–99)
Potassium: 4.3 mmol/L (ref 3.5–5.1)
Sodium: 135 mmol/L (ref 135–145)
Total Bilirubin: 0.7 mg/dL (ref 0.0–1.2)
Total Protein: 6.8 g/dL (ref 6.5–8.1)

## 2024-01-22 LAB — CBC WITH DIFFERENTIAL/PLATELET
Abs Immature Granulocytes: 0.04 K/uL (ref 0.00–0.07)
Basophils Absolute: 0 K/uL (ref 0.0–0.1)
Basophils Relative: 0 %
Eosinophils Absolute: 0.1 K/uL (ref 0.0–0.5)
Eosinophils Relative: 1 %
HCT: 42.9 % (ref 36.0–46.0)
Hemoglobin: 13.9 g/dL (ref 12.0–15.0)
Immature Granulocytes: 0 %
Lymphocytes Relative: 7 %
Lymphs Abs: 0.9 K/uL (ref 0.7–4.0)
MCH: 30.4 pg (ref 26.0–34.0)
MCHC: 32.4 g/dL (ref 30.0–36.0)
MCV: 93.9 fL (ref 80.0–100.0)
Monocytes Absolute: 0.9 K/uL (ref 0.1–1.0)
Monocytes Relative: 7 %
Neutro Abs: 10.6 K/uL — ABNORMAL HIGH (ref 1.7–7.7)
Neutrophils Relative %: 85 %
Platelets: 118 K/uL — ABNORMAL LOW (ref 150–400)
RBC: 4.57 MIL/uL (ref 3.87–5.11)
RDW: 13 % (ref 11.5–15.5)
WBC: 12.5 K/uL — ABNORMAL HIGH (ref 4.0–10.5)
nRBC: 0 % (ref 0.0–0.2)

## 2024-01-22 LAB — TROPONIN I (HIGH SENSITIVITY)
Troponin I (High Sensitivity): 4 ng/L (ref <18)
Troponin I (High Sensitivity): 5 ng/L (ref <18)

## 2024-01-22 MED ORDER — ACETAMINOPHEN 325 MG PO TABS
ORAL_TABLET | ORAL | Status: AC
  Filled 2024-01-22: qty 2

## 2024-01-22 MED ORDER — ACETAMINOPHEN 325 MG PO TABS
650.0000 mg | ORAL_TABLET | Freq: Four times a day (QID) | ORAL | Status: AC | PRN
  Administered 2024-01-22: 650 mg via ORAL

## 2024-01-22 NOTE — ED Notes (Signed)
 Please call grandson Christopher when pt gets a room [phone]

## 2024-01-22 NOTE — ED Provider Triage Note (Signed)
 Emergency Medicine Provider Triage Evaluation Note  [name] , a 73 y.o. female  was evaluated in triage.  Pt complains of weakness and dizziness for the last 2 days.  Review of Systems  Positive: Fall Negative: Chest pain  Physical Exam  BP 124/72 (BP Location: Right Arm)   Pulse 83   Temp 97.6 F (36.4 C)   Resp 16   Ht 5' 3 (1.6 m)   Wt 66.7 kg   SpO2 97%   BMI 26.04 kg/m  Gen:   Awake, no distress   Resp:  Normal effort  MSK:   Moves extremities without difficulty  Other:    Medical Decision Making  Medically screening exam initiated at 5:26 PM.  Appropriate orders placed.  Oralia Criger was informed that the remainder of the evaluation will be completed by another provider, this initial triage assessment does not replace that evaluation, and the importance of remaining in the ED until their evaluation is complete.     Towana Ozell BROCKS, MD 01/23/24 [phone]

## 2024-01-22 NOTE — ED Triage Notes (Addendum)
 Weakness x 3 days. Feeling generally unwell. Had some feelings today of intermittent off balance/dizziness. Also with 1 week of constipation. Had a mechanical fall (no dizziness) but was trying to get in bed. No new pain from the fall but does have chronic back pain (9/10).

## 2024-01-23 ENCOUNTER — Emergency Department (HOSPITAL_COMMUNITY)

## 2024-01-23 LAB — URINALYSIS, ROUTINE W REFLEX MICROSCOPIC
Bilirubin Urine: NEGATIVE
Glucose, UA: NEGATIVE mg/dL
Hgb urine dipstick: NEGATIVE
Ketones, ur: NEGATIVE mg/dL
Nitrite: NEGATIVE
Protein, ur: 30 mg/dL — AB
Specific Gravity, Urine: 1.023 (ref 1.005–1.030)
pH: 5 (ref 5.0–8.0)

## 2024-01-23 NOTE — ED Provider Notes (Signed)
  EMERGENCY DEPARTMENT AT [facility] Provider Note   CSN: [id number] Arrival date & time: 01/22/24  1709     Patient presents with: Weakness   [name] is a 73 y.o. female.   The history is provided by the patient and medical records.  Weakness Associated symptoms: arthralgias    63 old female with history of hypertension, diabetes with peripheral neuropathy, GERD, chronic back pain, sleep apnea, presenting to the ED after a fall.  Patient reports she has been feeling a little generally weak for about 3 days now.  She was trying to get on her bed today and sustained a fall, fell forward onto the floor.  She denies any head injury or loss of consciousness.  States she does have chronic back and bilateral knee pain, right worse than left, seems a little worse after the fall.  She denies any numbness or weakness of the legs.  No bowel or bladder incontinence.  States she has been having a lot of balance issues recently.  She denies any dizziness but feels like she is off balance as she is bearing most of her weight on her left side due to issues with the right knee.  She denies any room spinning sensations, headaches, confusions, blurred vision, etc.  She denies abdominal pain.  No fever/chills.  No nausea/vomiting.  Eating/drinking normally.  Prior to Admission medications   Medication Sig Start Date End Date Taking? Authorizing Provider  ACCU-CHEK AVIVA PLUS test strip USE BID FOR GLUCOSE TESTING 12/19/14   [provider]  albuterol  (VENTOLIN  HFA) 108 (90 Base) MCG/ACT inhaler 1 puff inhaled 1-4 times daily PRN for 30 days 02/17/17   [provider]  allopurinol  (ZYLOPRIM ) 100 MG tablet Take 1 tablet (100 mg total) by mouth daily. Patient taking differently: Take 100 mg by mouth daily as needed. 07/12/19 02/07/23  Maree, Pratik D, DO  aspirin  81 MG chewable tablet Chew 1 tablet (81 mg total) by mouth daily. 09/20/19   Simmons-Robinson, Makiera, MD   atorvastatin  (LIPITOR) 20 MG tablet Take 20 mg by mouth daily. 05/03/19   [provider]  buprenorphine (BUTRANS) 5 MCG/HR PTWK Place 1 patch onto the skin once a week. 01/30/23   [provider]  clopidogrel  (PLAVIX ) 75 MG tablet Take 75 mg by mouth daily.    [provider]  diclofenac  sodium (VOLTAREN ) 1 % GEL Apply 2 g topically daily as needed (pain).  01/17/19   [provider]  donepezil  (ARICEPT ) 10 MG tablet TAKE 1 TABLET BY MOUTH AT BEDTIME Patient taking differently: Take 5 mg by mouth 2 (two) times daily. 02/04/23   Sethi, Pramod S, MD  feeding supplement, GLUCERNA SHAKE, (GLUCERNA SHAKE) LIQD Take 237 mLs by mouth 3 (three) times daily between meals. 09/19/19   Simmons-Robinson, Rockie, MD  ferrous sulfate  325 (65 FE) MG tablet Take 1 tablet (325 mg total) by mouth daily with breakfast. 07/12/19 02/07/23  Maree, Pratik D, DO  folic acid  (FOLVITE ) 1 MG tablet Take 1 tablet (1 mg total) by mouth daily. 11/29/20   Rosemarie Eather RAMAN, MD  gabapentin  (NEURONTIN ) 300 MG capsule Take 300 mg by mouth 3 (three) times daily.    [provider]  losartan  (COZAAR ) 25 MG tablet Take 1 tablet (25 mg total) by mouth daily. 10/23/15   Ricky Fines, MD  meloxicam  (MOBIC ) 15 MG tablet Take 15 mg by mouth daily as needed for pain. 02/04/23   [provider]  [facility] 5  MG/0.5ML Pen Inject 5 mg into the skin once a week. Thursdays 12/31/22   [provider]  oxyCODONE -acetaminophen  (PERCOCET) 10-325 MG tablet Take 1 tablet by mouth 4 (four) times daily as needed.    [provider]  pantoprazole  (PROTONIX ) 20 MG tablet Take 20 mg by mouth daily. 02/04/23   [provider]  SUMAtriptan  (IMITREX ) 50 MG tablet Take 1 tablet (50 mg total) by mouth daily as needed for migraine or headache. 02/08/23   Celestina Czar, MD  tiZANidine  (ZANAFLEX ) 4 MG tablet Take 4 mg by mouth 2 (two) times daily as needed for muscle spasms.    [provider]  topiramate  (TOPAMAX ) 100 MG tablet TAKE 1 AND 1/2 TABLETS BY MOUTH 2 TIMES DAILY 09/02/23   Rosemarie Eather RAMAN, MD  traZODone  (DESYREL ) 50 MG tablet Take 50 mg by mouth at bedtime as needed for sleep.    [provider]  Vitamin D, Ergocalciferol, (DRISDOL) 1.25 MG (50000 UNIT) CAPS capsule Take 50,000 Units by mouth every 7 (seven) days. Saturdays 01/12/23   [provider]    Allergies: Patient has no known allergies.    Review of Systems  Constitutional:        Fall  Musculoskeletal:  Positive for arthralgias and back pain.  Neurological:  Positive for weakness.  All other systems reviewed and are negative.   Updated Vital Signs BP 117/62   Pulse 71   Temp 98.3 F (36.8 C) (Oral)   Resp 15   Ht 5' 3 (1.6 m)   Wt 66.7 kg   SpO2 98%   BMI 26.04 kg/m   Physical Exam Vitals and nursing note reviewed.  Constitutional:      Appearance: She is well-developed.  HENT:     Head: Normocephalic and atraumatic.     Comments: No visible head trauma Eyes:     Conjunctiva/sclera: Conjunctivae normal.     Pupils: Pupils are equal, round, and reactive to light.  Cardiovascular:     Rate and Rhythm: Normal rate and regular rhythm.     Heart sounds: Normal heart sounds.  Pulmonary:     Effort: Pulmonary effort is normal.     Breath sounds: Normal breath sounds.  Abdominal:     General: Bowel sounds are normal.     Palpations: Abdomen is soft.  Musculoskeletal:        General: Normal range of motion.     Cervical back: Normal range of motion.     Comments: Right knee appears arthritic, there is no bony deformity or significant effusion, some mild crepitus with ROM No tenderness along C/T spine, lumbar spine with some mild tenderness along L3-L5 area, there is no step-off or deformity  Skin:    General: Skin is warm and dry.  Neurological:     Mental Status: She is alert and oriented to person, place, and time.     Comments: AAOx3, answering questions and  following commands appropriately, moving extremities without focal deficit, ambulatory with antalgic gait favoring right leg     (all labs ordered are listed, but only abnormal results are displayed) Labs Reviewed  COMPREHENSIVE METABOLIC PANEL WITH GFR - Abnormal; Notable for the following components:      Result Value   CO2 20 (*)    Glucose, Bld 248 (*)    BUN 32 (*)    Creatinine, Ser 1.50 (*)    Albumin 3.3 (*)    GFR, Estimated 37 (*)    All other  components within normal limits  CBC WITH DIFFERENTIAL/PLATELET - Abnormal; Notable for the following components:   WBC 12.5 (*)    Platelets 118 (*)    Neutro Abs 10.6 (*)    All other components within normal limits  URINALYSIS, ROUTINE W REFLEX MICROSCOPIC - Abnormal; Notable for the following components:   Protein, ur 30 (*)    Leukocytes,Ua TRACE (*)    Bacteria, UA RARE (*)    All other components within normal limits  TROPONIN I (HIGH SENSITIVITY)  TROPONIN I (HIGH SENSITIVITY)    EKG: EKG Interpretation Date/Time:  Friday January 22 2024 17:20:21 EDT Ventricular Rate:  84 PR Interval:  178 QRS Duration:  96 QT Interval:  390 QTC Calculation: 460 R Axis:   -7  Text Interpretation: Normal sinus rhythm Moderate voltage criteria for LVH, may be normal variant ( R in aVL , Cornell product ) Cannot rule out Anterior infarct , age undetermined Abnormal ECG When compared with ECG of 07-Feb-2023 11:02, No significant change since last tracing Confirmed by Towana Sharper [phone]) on 01/22/2024 5:25:51 PM  Radiology: CT Lumbar Spine Wo Contrast Result Date: 01/23/2024 CLINICAL DATA:  Initial evaluation for acute trauma, fall, radiculopathy. EXAM: CT LUMBAR SPINE WITHOUT CONTRAST TECHNIQUE: Multidetector CT imaging of the lumbar spine was performed without intravenous contrast administration. Multiplanar CT image reconstructions were also generated. RADIATION DOSE REDUCTION: This exam was performed according to the departmental  dose-optimization program which includes automated exposure control, adjustment of the mA and/or kV according to patient size and/or use of iterative reconstruction technique. COMPARISON:  Prior radiograph from 05/22/2022 FINDINGS: Segmentation: Standard. Lowest well-formed disc space labeled the L5-S1 level. Alignment: Physiologic with preservation of the normal lumbar lordosis. No listhesis. Vertebrae: Vertebral body height maintained without acute or chronic fracture. Visualized sacrum and pelvis intact. No worrisome osseous lesions. Prior PLIF at L4-5 and L5-S1. No hardware complication. Paraspinal and other soft tissues: Paraspinous soft tissues demonstrate no acute finding. Mild aortic atherosclerosis. Prior cholecystectomy. Suspected morphologic changes of hepatic cirrhosis about the visualized liver. Scattered venous collaterals noted within the visualized upper abdomen, suggesting portal hypertension. Disc levels: L1-2: Minimal annular disc bulge. Mild bilateral facet hypertrophy. No spinal stenosis. Foramina remain patent. L2-3: Diffuse disc bulge. Superimposed right foraminal to extraforaminal disc protrusion (series 5, image 44). Moderate facet and ligament flavum hypertrophy. Resultant mild spinal stenosis with moderate right greater than left lateral recess narrowing. Moderate right worse than left L2 foraminal stenosis. L3-4: Mild disc bulge. Moderate bilateral facet arthrosis. Resultant mild canal with bilateral subarticular stenosis. Foramina remain patent. L4-5: Prior PLIF. No residual spinal stenosis. Foramina appear patent. L5-S1: Prior PLIF. No significant residual spinal stenosis. Foramina appear patent. IMPRESSION: 1. No acute osseous injury within the lumbar spine. 2. Prior PLIF at L4-5 and L5-S1 without residual or recurrent spinal stenosis. 3. Degenerative spondylosis at L2-3 and L3-4 with resultant mild to moderate spinal stenosis as above. Moderate bilateral L2 foraminal narrowing. 4.  Probable morphologic changes of hepatic cirrhosis about the visualized liver. Scattered venous collaterals within the visualized upper abdomen suggestive of portal hypertension. 5.  Aortic Atherosclerosis (ICD10-I70.0). Electronically Signed   By: Morene Hoard M.D.   On: 01/23/2024 02:39   CT Head Wo Contrast Result Date: 01/23/2024 CLINICAL DATA:  Syncope/presyncope, cerebrovascular cause suspected, dizziness, fall EXAM: CT HEAD WITHOUT CONTRAST TECHNIQUE: Contiguous axial images were obtained from the base of the skull through the vertex without intravenous contrast. RADIATION DOSE REDUCTION: This exam was performed according to the departmental  dose-optimization program which includes automated exposure control, adjustment of the mA and/or kV according to patient size and/or use of iterative reconstruction technique. COMPARISON:  None Available. FINDINGS: Brain: No evidence of acute infarction, hemorrhage, hydrocephalus, extra-axial collection or mass lesion/mass effect. Vascular: No hyperdense vessel or unexpected calcification. Skull: Normal. Negative for fracture or focal lesion. Sinuses/Orbits: No acute finding. Other: Mastoid air cells and middle ear cavities are clear. IMPRESSION: 1. No acute intracranial abnormality. Electronically Signed   By: Dorethia Molt M.D.   On: 01/23/2024 01:55   DG Knee Complete 4 Views Right Result Date: 01/23/2024 CLINICAL DATA:  Knee pain and weakness.  Mechanical fall recently. EXAM: RIGHT KNEE - COMPLETE 4+ VIEW COMPARISON:  Study of 09/20/2019. FINDINGS: Four views. There is no evidence of fracture, dislocation or other focal bone lesions. There is a small suprapatellar bursal effusion. There is moderate enthesopathic spurring of the anterior patella particularly at the quadriceps insertion. There is near complete medial femoral tibial joint space loss, mild narrowing in the patellofemoral and lateral compartments. There are tricompartmental marginal osteophytes  greatest medially. No loose body is seen. Superficial soft tissues are unremarkable. No radiopaque foreign body. IMPRESSION: 1. No evidence of fracture or dislocation. 2. Small suprapatellar bursal effusion. 3. Tricompartmental degenerative arthrosis, greatest in the medial femoral tibial compartment. Electronically Signed   By: Francis Quam M.D.   On: 01/23/2024 01:46     Procedures   Medications Ordered in the ED  acetaminophen  (TYLENOL ) tablet 650 mg (650 mg Oral Given 01/22/24 2237)    Clinical Course as of 01/23/24 0545  Sat Jan 23, 2024  3460 73 year old female with weakness. Multiple other complaints.  Waiting on urine.  Grossly leukocytosis [CC]    Clinical Course User Index [CC] Jerral Meth, MD                                 Medical Decision Making Amount and/or Complexity of Data Reviewed Radiology: ordered and independent interpretation performed. ECG/medicine tests: ordered and independent interpretation performed.  Risk OTC drugs.   73 year old female presenting to the ED with generalized weakness.  Sustained a fall today now having some increased right knee and low back pain.  Denies head injury or loss of consciousness.  Feel like she has trouble with her balance due to chronic issues related to her knee and back which cause her to walk more so on left side.  She denies dizziness or room spinning sensation.  No chest pain or shortness of breath.  No abdominal pain.  He is afebrile and nontoxic in appearance here.  She is hemodynamically stable.  She does have arthritic appearance to the right knee but I do not appreciate any bony deformities or significant swelling.  She is able to range the knee, some crepitus.  No acute deformities noted of the spine but does have some tenderness along L3-L5 range.  She is not have any neurologic deficits here.    Labs were obtained from triage--does have leukocytosis but no fever or other infectious symptoms.  No significant  electrolyte derangement.  SrCr just slightly above baseline.  Trop negative x2.   EKG NSR.  Will add on CT head, lumbar spine and knee films.  Awaiting UA as well.  Imaging reviewed-- no acute findings, no traumatic injuries. UA with rare bacteria, trace leuks.  Do not feel this is indicative of acute infection.  No findings on exam today  to warrant admission.  Recommend that she follow-up closely with PCP.  Can return here for new concerns.    Final diagnoses:  Generalized weakness  Fall in home, initial encounter    ED Discharge Orders     None          Jarold Olam CHRISTELLA DEVONNA 01/23/24 [id number]    Jerral Meth, MD 01/23/24 2255

## 2024-01-23 NOTE — Discharge Instructions (Signed)
 Work-up today was reassuring.  Imaging did now show any acute findings, you do have some arthritis in your back and knee. Recommend Tylenol  as needed for pain. Please follow-up with your primary care doctor.   Return here for new concerns.

## 2024-01-23 NOTE — ED Notes (Signed)
 Pt attempted to use bedpan for urine sample, unsuccessful at this time.

## 2024-02-01 ENCOUNTER — Other Ambulatory Visit: Payer: Self-pay | Admitting: Neurology

## 2024-03-11 ENCOUNTER — Ambulatory Visit: Admitting: Family Medicine

## 2024-04-18 ENCOUNTER — Other Ambulatory Visit: Payer: Self-pay | Admitting: Neurology

## 2024-04-18 NOTE — Telephone Encounter (Signed)
 Last seen on 09/2022 No 6 month follow up scheduled

## 2024-04-27 ENCOUNTER — Ambulatory Visit: Admitting: Family Medicine

## 2024-05-03 NOTE — Progress Notes (Unsigned)
 [facility] [address] [place] [address] Phone: [phone] Subjective:   [name], am serving as a scribe for Dr. Arthea Claudene.  I'm seeing this patient by the request  of:  Pcp, No  CC: Bilateral thigh pain  YEP:Dlagzrupcz  [name] is a 73 y.o. female coming in with complaint of B thigh burning that radiates to her feet. Patient states that she had back surgery in 2010. Patient has burning in both quads, L>R. Pain is constant and is intense. Pain in L glute as well. Uses Tylenol  for pain relief.   Chronic B knee pain. Has had cortisone and gel injections which gave her temporary relief.    Reviewing patient's notes in August did have a fall with generalized weakness and had to go to the emergency room.  Needs repeat labs to further evaluate kidney function as well as blood sugar. 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 Past Medical History:  Diagnosis Date   Arthritis    Diabetes mellitus without complication (HCC)    GERD (gastroesophageal reflux disease)    H/O hiatal hernia    Headache(784.0)    Hyperlipidemia    Hypertension    Neuromuscular disorder (HCC)    neuropathy feet    Sleep apnea    recently diagnosed- wears cpap   Stroke (HCC) 06/2019   Past Surgical History:  Procedure Laterality Date   ABDOMINAL HYSTERECTOMY     partial   BACK SURGERY     BUBBLE STUDY  07/11/2019   Procedure: BUBBLE STUDY;  Surgeon: Claudene Pacific, MD;  Location: [facility] ENDOSCOPY;  Service: Cardiovascular;;   CHOLECYSTECTOMY     COLONOSCOPY     age 82- doesnt know where or who    implantable loop recorder placement  10/26/2019   Medtronic Reveal [place] model [id number] ([id number]) implantable loop recorder implanted by Dr Kelsie for cryptogenic stroke   left shoulder     x 2 surgeries   right shoulder     x2   SHOULDER OPEN ROTATOR CUFF  REPAIR Right 08/25/2012   Procedure: ROTATOR CUFF REPAIR SHOULDER OPEN WITH ACROMIOPLASTY;  Surgeon: Tanda DELENA Heading, MD;  Location: [facility] ORS;  Service: Orthopedics;  Laterality: Right;   TEE WITHOUT CARDIOVERSION N/A 07/11/2019   Procedure: TRANSESOPHAGEAL ECHOCARDIOGRAM (TEE);  Surgeon: Claudene Pacific, MD;  Location: [facility] ENDOSCOPY;  Service: Cardiovascular;  Laterality: N/A;   TONSILLECTOMY     Social History   Socioeconomic History   Marital status: Divorced    Spouse name: Not on file   Number of children: 3   Years of education: Not on file   Highest education level: Not on file  Occupational History   Occupation: retired    Associate Professor: [employer]  Tobacco Use   Smoking status: Never   Smokeless tobacco: Never  Vaping Use   Vaping status: Never Used  Substance and Sexual Activity   Alcohol  use: Yes    Alcohol /week: 0.0 standard drinks of alcohol     Comment: rarely   Drug use: No   Sexual activity: Not Currently  Other Topics Concern   Not on file  Social History Narrative   Lives alone   Right Handed   Drinks caffeine very seldomly   Social Drivers of Health   Financial Resource Strain: Not on file  Food Insecurity: No Food Insecurity (02/07/2023)   Hunger Vital Sign  Worried About Programme Researcher, Broadcasting/film/video in the Last Year: Never true    Ran Out of Food in the Last Year: Never true  Transportation Needs: No Transportation Needs (02/07/2023)   PRAPARE - Administrator, Civil Service (Medical): No    Lack of Transportation (Non-Medical): No  Physical Activity: Not on file  Stress: Not on file  Social Connections: Not on file   No Known Allergies Family History  Problem Relation Age of Onset   Colon cancer Father    Colon cancer Paternal Grandfather    Colon cancer Maternal Grandfather    Bone cancer Maternal Uncle    Colon polyps Neg Hx    Esophageal cancer Neg Hx    Rectal cancer Neg Hx    Stomach cancer Neg Hx     Current Outpatient Medications  (Endocrine & Metabolic):    MOUNJARO 5 MG/0.5ML Pen, Inject 5 mg into the skin once a week. Thursdays  Current Outpatient Medications (Cardiovascular):    atorvastatin  (LIPITOR) 20 MG tablet, Take 20 mg by mouth daily.   losartan  (COZAAR ) 25 MG tablet, Take 1 tablet (25 mg total) by mouth daily.  Current Outpatient Medications (Respiratory):    albuterol  (VENTOLIN  HFA) 108 (90 Base) MCG/ACT inhaler, 1 puff inhaled 1-4 times daily PRN for 30 days  Current Outpatient Medications (Analgesics):    allopurinol  (ZYLOPRIM ) 100 MG tablet, Take 1 tablet (100 mg total) by mouth daily. (Patient taking differently: Take 100 mg by mouth daily as needed.)   aspirin  81 MG chewable tablet, Chew 1 tablet (81 mg total) by mouth daily.   buprenorphine (BUTRANS) 5 MCG/HR PTWK, Place 1 patch onto the skin once a week.   meloxicam  (MOBIC ) 15 MG tablet, Take 15 mg by mouth daily as needed for pain.   oxyCODONE -acetaminophen  (PERCOCET) 10-325 MG tablet, Take 1 tablet by mouth 4 (four) times daily as needed.   SUMAtriptan  (IMITREX ) 50 MG tablet, Take 1 tablet (50 mg total) by mouth daily as needed for migraine or headache.  Current Outpatient Medications (Hematological):    clopidogrel  (PLAVIX ) 75 MG tablet, Take 75 mg by mouth daily.   ferrous sulfate  325 (65 FE) MG tablet, Take 1 tablet (325 mg total) by mouth daily with breakfast.   folic acid  (FOLVITE ) 1 MG tablet, Take 1 tablet (1 mg total) by mouth daily.  Current Outpatient Medications (Other):    ACCU-CHEK AVIVA PLUS test strip, USE BID FOR GLUCOSE TESTING   diclofenac  sodium (VOLTAREN ) 1 % GEL, Apply 2 g topically daily as needed (pain).    donepezil  (ARICEPT ) 10 MG tablet, TAKE 1 TABLET BY MOUTH AT BEDTIME (Patient taking differently: Take 5 mg by mouth 2 (two) times daily.)   feeding supplement, GLUCERNA SHAKE, (GLUCERNA SHAKE) LIQD, Take 237 mLs by mouth 3 (three) times daily between meals.   gabapentin  (NEURONTIN ) 300 MG capsule, Take 300 mg by mouth  3 (three) times daily.   pantoprazole  (PROTONIX ) 20 MG tablet, Take 20 mg by mouth daily.   tiZANidine  (ZANAFLEX ) 4 MG tablet, Take 4 mg by mouth 2 (two) times daily as needed for muscle spasms.   topiramate  (TOPAMAX ) 100 MG tablet, TAKE 1 AND 1/2 TABLETS BY MOUTH 2 TIMES DAILY   traZODone  (DESYREL ) 50 MG tablet, Take 50 mg by mouth at bedtime as needed for sleep.   Vitamin D, Ergocalciferol, (DRISDOL) 1.25 MG (50000 UNIT) CAPS capsule, Take 50,000 Units by mouth every 7 (seven) days. Saturdays   Reviewed prior external information including  notes and imaging from  primary care provider As well as notes that were available from care everywhere and other healthcare systems.  Past medical history, social, surgical and family history all reviewed in electronic medical record.  No pertanent information unless stated regarding to the chief complaint.   Review of Systems:  No headache, visual changes, nausea, vomiting, diarrhea, constipation, dizziness, abdominal pain, skin rash, fevers, chills, night sweats, weight loss, swollen lymph nodes, body aches, joint swelling, chest pain, shortness of breath, mood changes. POSITIVE muscle aches  Objective  Blood pressure 118/80, pulse 87, height 5' 3 (1.6 m), weight 156 lb (70.8 kg), SpO2 98%.   General: No apparent distress alert and oriented x3 mood and affect normal, dressed appropriately.  HEENT: Pupils equal, extraocular movements intact  Respiratory: Patient's speak in full sentences and does not appear short of breath  Cardiovascular: No lower extremity edema, non tender, no erythema   Back exam shows significant loss of lordosis noted.  Some tenderness to palpation diffusely.  Some atrophy noted of the gluteal musculature bilaterally.  Leg exam shows he is ambulatory.  Abnormal thigh to calf ratio noted.  Knee exam shows significant arthritic changes noted.  Instability with valgus and varus force of the knees bilaterally and some limited  range of motion.  After informed written and verbal consent, patient was seated on exam table. Right knee was prepped with alcohol  swab and utilizing anterolateral approach, patient's right knee space was injected with 4:1  marcaine  0.5%: Kenalog  40mg /dL. Patient tolerated the procedure well without immediate complications.  After informed written and verbal consent, patient was seated on exam table. Left knee was prepped with alcohol  swab and utilizing anterolateral approach, patient's left knee space was injected with 4:1  marcaine  0.5%: Kenalog  40mg /dL. Patient tolerated the procedure well without immediate complications.   Impression and Recommendations:    The above documentation has been reviewed and is accurate and complete Bryer Cozzolino M Shriya Aker, DO

## 2024-05-04 ENCOUNTER — Ambulatory Visit (INDEPENDENT_AMBULATORY_CARE_PROVIDER_SITE_OTHER): Admitting: Family Medicine

## 2024-05-04 ENCOUNTER — Encounter: Payer: Self-pay | Admitting: Family Medicine

## 2024-05-04 ENCOUNTER — Ambulatory Visit (INDEPENDENT_AMBULATORY_CARE_PROVIDER_SITE_OTHER)

## 2024-05-04 VITALS — BP 118/80 | HR 87 | Ht 63.0 in | Wt 156.0 lb

## 2024-05-04 DIAGNOSIS — M25562 Pain in left knee: Secondary | ICD-10-CM

## 2024-05-04 DIAGNOSIS — M5416 Radiculopathy, lumbar region: Secondary | ICD-10-CM | POA: Diagnosis not present

## 2024-05-04 DIAGNOSIS — N183 Chronic kidney disease, stage 3 unspecified: Secondary | ICD-10-CM

## 2024-05-04 DIAGNOSIS — R5383 Other fatigue: Secondary | ICD-10-CM | POA: Diagnosis not present

## 2024-05-04 DIAGNOSIS — M170 Bilateral primary osteoarthritis of knee: Secondary | ICD-10-CM | POA: Diagnosis not present

## 2024-05-04 DIAGNOSIS — N179 Acute kidney failure, unspecified: Secondary | ICD-10-CM

## 2024-05-04 LAB — CBC WITH DIFFERENTIAL/PLATELET
Basophils Absolute: 0 K/uL (ref 0.0–0.1)
Basophils Relative: 1 % (ref 0.0–3.0)
Eosinophils Absolute: 0.4 K/uL (ref 0.0–0.7)
Eosinophils Relative: 8.1 % — ABNORMAL HIGH (ref 0.0–5.0)
HCT: 42.5 % (ref 36.0–46.0)
Hemoglobin: 14.3 g/dL (ref 12.0–15.0)
Lymphocytes Relative: 37.4 % (ref 12.0–46.0)
Lymphs Abs: 1.7 K/uL (ref 0.7–4.0)
MCHC: 33.7 g/dL (ref 30.0–36.0)
MCV: 91.2 fl (ref 78.0–100.0)
Monocytes Absolute: 0.4 K/uL (ref 0.1–1.0)
Monocytes Relative: 9.6 % (ref 3.0–12.0)
Neutro Abs: 2 K/uL (ref 1.4–7.7)
Neutrophils Relative %: 43.9 % (ref 43.0–77.0)
Platelets: 145 K/uL — ABNORMAL LOW (ref 150.0–400.0)
RBC: 4.66 Mil/uL (ref 3.87–5.11)
RDW: 13.6 % (ref 11.5–15.5)
WBC: 4.5 K/uL (ref 4.0–10.5)

## 2024-05-04 LAB — COMPREHENSIVE METABOLIC PANEL WITH GFR
ALT: 34 U/L (ref 0–35)
AST: 20 U/L (ref 0–37)
Albumin: 3.8 g/dL (ref 3.5–5.2)
Alkaline Phosphatase: 67 U/L (ref 39–117)
BUN: 26 mg/dL — ABNORMAL HIGH (ref 6–23)
CO2: 28 meq/L (ref 19–32)
Calcium: 10.5 mg/dL (ref 8.4–10.5)
Chloride: 105 meq/L (ref 96–112)
Creatinine, Ser: 1.39 mg/dL — ABNORMAL HIGH (ref 0.40–1.20)
GFR: 38.27 mL/min — ABNORMAL LOW (ref >60.00)
Glucose, Bld: 122 mg/dL — ABNORMAL HIGH (ref 70–99)
Potassium: 4.4 meq/L (ref 3.5–5.1)
Sodium: 140 meq/L (ref 135–145)
Total Bilirubin: 0.7 mg/dL (ref 0.2–1.2)
Total Protein: 7.2 g/dL (ref 6.0–8.3)

## 2024-05-04 LAB — HEMOGLOBIN A1C: Hgb A1c MFr Bld: 5.9 % (ref 4.6–6.5)

## 2024-05-04 NOTE — Patient Instructions (Addendum)
 Injections for knees Epidural L3/4 OA braces Xray today Lab today See me again in 10 weeks

## 2024-05-04 NOTE — Assessment & Plan Note (Signed)
 Bilateral injections given today and tolerated the procedure well, discussed icing regimen and home exercises, discussed which activities to do and which ones to avoid.  Discussed patient is a candidate for viscosupplementation and we will try to get approval.  Patient is ambulatory with an abnormal thigh to calf ratio as well as instability of the knees bilaterally with valgus and varus force and do feel OA stability braces would be beneficial for this individual to keep her active.  Will see if we can get her fitted in the near future.  Follow-up again in 2 to 3 months

## 2024-05-04 NOTE — Assessment & Plan Note (Signed)
 Repeat labs to further evaluate patient's chronic kidney disease with patient not following up with her PCP.

## 2024-05-05 ENCOUNTER — Ambulatory Visit: Payer: Self-pay | Admitting: Family Medicine

## 2024-05-05 ENCOUNTER — Emergency Department (HOSPITAL_COMMUNITY)

## 2024-05-05 ENCOUNTER — Inpatient Hospital Stay (HOSPITAL_COMMUNITY)
Admission: EM | Admit: 2024-05-05 | Discharge: 2024-05-07 | DRG: 689 | Disposition: A | Discharge status: Home / Self Care | Attending: Internal Medicine | Admitting: Internal Medicine

## 2024-05-05 DIAGNOSIS — Z808 Family history of malignant neoplasm of other organs or systems: Secondary | ICD-10-CM

## 2024-05-05 DIAGNOSIS — Z886 Allergy status to analgesic agent status: Secondary | ICD-10-CM

## 2024-05-05 DIAGNOSIS — I2510 Atherosclerotic heart disease of native coronary artery without angina pectoris: Secondary | ICD-10-CM | POA: Diagnosis present

## 2024-05-05 DIAGNOSIS — D693 Immune thrombocytopenic purpura: Secondary | ICD-10-CM | POA: Diagnosis present

## 2024-05-05 DIAGNOSIS — I959 Hypotension, unspecified: Secondary | ICD-10-CM | POA: Diagnosis present

## 2024-05-05 DIAGNOSIS — Z7902 Long term (current) use of antithrombotics/antiplatelets: Secondary | ICD-10-CM

## 2024-05-05 DIAGNOSIS — Z8673 Personal history of transient ischemic attack (TIA), and cerebral infarction without residual deficits: Secondary | ICD-10-CM

## 2024-05-05 DIAGNOSIS — K219 Gastro-esophageal reflux disease without esophagitis: Secondary | ICD-10-CM | POA: Diagnosis present

## 2024-05-05 DIAGNOSIS — D61818 Other pancytopenia: Principal | ICD-10-CM

## 2024-05-05 DIAGNOSIS — G8929 Other chronic pain: Secondary | ICD-10-CM | POA: Diagnosis present

## 2024-05-05 DIAGNOSIS — E1121 Type 2 diabetes mellitus with diabetic nephropathy: Secondary | ICD-10-CM | POA: Diagnosis present

## 2024-05-05 DIAGNOSIS — I10 Essential (primary) hypertension: Secondary | ICD-10-CM | POA: Diagnosis present

## 2024-05-05 DIAGNOSIS — Z7984 Long term (current) use of oral hypoglycemic drugs: Secondary | ICD-10-CM

## 2024-05-05 DIAGNOSIS — Z79899 Other long term (current) drug therapy: Secondary | ICD-10-CM

## 2024-05-05 DIAGNOSIS — G4700 Insomnia, unspecified: Secondary | ICD-10-CM | POA: Diagnosis present

## 2024-05-05 DIAGNOSIS — Z800 Family history of malignant neoplasm of digestive organs: Secondary | ICD-10-CM

## 2024-05-05 DIAGNOSIS — N189 Chronic kidney disease, unspecified: Secondary | ICD-10-CM | POA: Diagnosis present

## 2024-05-05 DIAGNOSIS — R079 Chest pain, unspecified: Secondary | ICD-10-CM | POA: Diagnosis present

## 2024-05-05 DIAGNOSIS — G9341 Metabolic encephalopathy: Secondary | ICD-10-CM | POA: Diagnosis present

## 2024-05-05 DIAGNOSIS — Z7982 Long term (current) use of aspirin: Secondary | ICD-10-CM

## 2024-05-05 DIAGNOSIS — N179 Acute kidney failure, unspecified: Secondary | ICD-10-CM | POA: Diagnosis present

## 2024-05-05 DIAGNOSIS — E1140 Type 2 diabetes mellitus with diabetic neuropathy, unspecified: Secondary | ICD-10-CM | POA: Diagnosis present

## 2024-05-05 DIAGNOSIS — Z7985 Long-term (current) use of injectable non-insulin antidiabetic drugs: Secondary | ICD-10-CM

## 2024-05-05 DIAGNOSIS — E785 Hyperlipidemia, unspecified: Secondary | ICD-10-CM | POA: Diagnosis present

## 2024-05-05 DIAGNOSIS — F03918 Unspecified dementia, unspecified severity, with other behavioral disturbance: Secondary | ICD-10-CM | POA: Diagnosis present

## 2024-05-05 DIAGNOSIS — N3000 Acute cystitis without hematuria: Secondary | ICD-10-CM | POA: Diagnosis not present

## 2024-05-05 DIAGNOSIS — T50905A Adverse effect of unspecified drugs, medicaments and biological substances, initial encounter: Secondary | ICD-10-CM

## 2024-05-05 LAB — CBG MONITORING, ED: Glucose-Capillary: 138 mg/dL — ABNORMAL HIGH (ref 70–99)

## 2024-05-05 MED ORDER — NALOXONE HCL 0.4 MG/ML IJ SOLN
INTRAMUSCULAR | Status: AC
  Filled 2024-05-05: qty 1

## 2024-05-05 MED ORDER — SODIUM CHLORIDE 0.9 % IV SOLN: INTRAVENOUS | Status: AC

## 2024-05-05 MED ORDER — NALOXONE HCL 2 MG/2ML IJ SOSY
PREFILLED_SYRINGE | INTRAMUSCULAR | Status: AC
  Filled 2024-05-05: qty 2

## 2024-05-05 MED ORDER — NALOXONE HCL 2 MG/2ML IJ SOSY
2.0000 mg | PREFILLED_SYRINGE | Freq: Once | INTRAMUSCULAR | Status: AC
  Administered 2024-05-05: 2 mg via INTRAVENOUS

## 2024-05-05 MED ORDER — SODIUM CHLORIDE 0.9 % IV BOLUS
1000.0000 mL | Freq: Once | INTRAVENOUS | Status: AC
  Administered 2024-05-05: 1000 mL via INTRAVENOUS

## 2024-05-06 ENCOUNTER — Other Ambulatory Visit: Payer: Self-pay

## 2024-05-06 ENCOUNTER — Emergency Department (HOSPITAL_COMMUNITY)

## 2024-05-06 DIAGNOSIS — I2510 Atherosclerotic heart disease of native coronary artery without angina pectoris: Secondary | ICD-10-CM | POA: Diagnosis present

## 2024-05-06 DIAGNOSIS — N179 Acute kidney failure, unspecified: Secondary | ICD-10-CM

## 2024-05-06 DIAGNOSIS — Z7985 Long-term (current) use of injectable non-insulin antidiabetic drugs: Secondary | ICD-10-CM | POA: Diagnosis not present

## 2024-05-06 DIAGNOSIS — I10 Essential (primary) hypertension: Secondary | ICD-10-CM

## 2024-05-06 DIAGNOSIS — B999 Unspecified infectious disease: Secondary | ICD-10-CM | POA: Diagnosis not present

## 2024-05-06 DIAGNOSIS — G9349 Other encephalopathy: Secondary | ICD-10-CM

## 2024-05-06 DIAGNOSIS — R079 Chest pain, unspecified: Secondary | ICD-10-CM | POA: Diagnosis not present

## 2024-05-06 DIAGNOSIS — E1121 Type 2 diabetes mellitus with diabetic nephropathy: Secondary | ICD-10-CM | POA: Diagnosis present

## 2024-05-06 DIAGNOSIS — Z7982 Long term (current) use of aspirin: Secondary | ICD-10-CM | POA: Diagnosis not present

## 2024-05-06 DIAGNOSIS — D693 Immune thrombocytopenic purpura: Secondary | ICD-10-CM | POA: Diagnosis present

## 2024-05-06 DIAGNOSIS — G9341 Metabolic encephalopathy: Secondary | ICD-10-CM | POA: Diagnosis present

## 2024-05-06 DIAGNOSIS — Z800 Family history of malignant neoplasm of digestive organs: Secondary | ICD-10-CM | POA: Diagnosis not present

## 2024-05-06 DIAGNOSIS — Z7902 Long term (current) use of antithrombotics/antiplatelets: Secondary | ICD-10-CM | POA: Diagnosis not present

## 2024-05-06 DIAGNOSIS — Z7984 Long term (current) use of oral hypoglycemic drugs: Secondary | ICD-10-CM | POA: Diagnosis not present

## 2024-05-06 DIAGNOSIS — K219 Gastro-esophageal reflux disease without esophagitis: Secondary | ICD-10-CM

## 2024-05-06 DIAGNOSIS — G8929 Other chronic pain: Secondary | ICD-10-CM | POA: Diagnosis present

## 2024-05-06 DIAGNOSIS — N3000 Acute cystitis without hematuria: Secondary | ICD-10-CM | POA: Diagnosis present

## 2024-05-06 DIAGNOSIS — Z79899 Other long term (current) drug therapy: Secondary | ICD-10-CM | POA: Diagnosis not present

## 2024-05-06 DIAGNOSIS — G4700 Insomnia, unspecified: Secondary | ICD-10-CM | POA: Diagnosis present

## 2024-05-06 DIAGNOSIS — Z886 Allergy status to analgesic agent status: Secondary | ICD-10-CM | POA: Diagnosis not present

## 2024-05-06 DIAGNOSIS — E1143 Type 2 diabetes mellitus with diabetic autonomic (poly)neuropathy: Secondary | ICD-10-CM

## 2024-05-06 DIAGNOSIS — E785 Hyperlipidemia, unspecified: Secondary | ICD-10-CM | POA: Diagnosis present

## 2024-05-06 DIAGNOSIS — Z8673 Personal history of transient ischemic attack (TIA), and cerebral infarction without residual deficits: Secondary | ICD-10-CM | POA: Diagnosis not present

## 2024-05-06 DIAGNOSIS — Z808 Family history of malignant neoplasm of other organs or systems: Secondary | ICD-10-CM | POA: Diagnosis not present

## 2024-05-06 DIAGNOSIS — E1140 Type 2 diabetes mellitus with diabetic neuropathy, unspecified: Secondary | ICD-10-CM | POA: Diagnosis present

## 2024-05-06 DIAGNOSIS — N189 Chronic kidney disease, unspecified: Secondary | ICD-10-CM | POA: Diagnosis present

## 2024-05-06 DIAGNOSIS — F03918 Unspecified dementia, unspecified severity, with other behavioral disturbance: Secondary | ICD-10-CM | POA: Diagnosis present

## 2024-05-06 DIAGNOSIS — I959 Hypotension, unspecified: Secondary | ICD-10-CM | POA: Diagnosis present

## 2024-05-06 LAB — CBC WITH DIFFERENTIAL/PLATELET
Abs Immature Granulocytes: 0.01 K/uL (ref 0.00–0.07)
Basophils Absolute: 0 K/uL (ref 0.0–0.1)
Basophils Relative: 1 %
Eosinophils Absolute: 0.2 K/uL (ref 0.0–0.5)
Eosinophils Relative: 6 %
HCT: 34.6 % — ABNORMAL LOW (ref 36.0–46.0)
Hemoglobin: 11.1 g/dL — ABNORMAL LOW (ref 12.0–15.0)
Immature Granulocytes: 0 %
Lymphocytes Relative: 41 %
Lymphs Abs: 1.6 K/uL (ref 0.7–4.0)
MCH: 30.7 pg (ref 26.0–34.0)
MCHC: 32.1 g/dL (ref 30.0–36.0)
MCV: 95.8 fL (ref 80.0–100.0)
Monocytes Absolute: 0.5 K/uL (ref 0.1–1.0)
Monocytes Relative: 13 %
Neutro Abs: 1.5 K/uL — ABNORMAL LOW (ref 1.7–7.7)
Neutrophils Relative %: 39 %
Platelets: 85 K/uL — ABNORMAL LOW (ref 150–400)
RBC: 3.61 MIL/uL — ABNORMAL LOW (ref 3.87–5.11)
RDW: 13.1 % (ref 11.5–15.5)
Smear Review: NORMAL
WBC: 3.9 K/uL — ABNORMAL LOW (ref 4.0–10.5)
nRBC: 0 % (ref 0.0–0.2)

## 2024-05-06 LAB — ETHANOL: Alcohol, Ethyl (B): <15 mg/dL (ref <15)

## 2024-05-06 LAB — URINE DRUG SCREEN
Amphetamines: NEGATIVE
Barbiturates: NEGATIVE
Benzodiazepines: NEGATIVE
Cocaine: NEGATIVE
Fentanyl: NEGATIVE
Methadone Scn, Ur: NEGATIVE
Opiates: NEGATIVE
Tetrahydrocannabinol: NEGATIVE

## 2024-05-06 LAB — COMPREHENSIVE METABOLIC PANEL WITH GFR
ALT: 31 U/L (ref 0–44)
AST: 20 U/L (ref 15–41)
Albumin: 3.1 g/dL — ABNORMAL LOW (ref 3.5–5.0)
Alkaline Phosphatase: 61 U/L (ref 38–126)
Anion gap: 8 (ref 5–15)
BUN: 28 mg/dL — ABNORMAL HIGH (ref 8–23)
CO2: 23 mmol/L (ref 22–32)
Calcium: 9.1 mg/dL (ref 8.9–10.3)
Chloride: 110 mmol/L (ref 98–111)
Creatinine, Ser: 1.2 mg/dL — ABNORMAL HIGH (ref 0.44–1.00)
GFR, Estimated: 48 mL/min — ABNORMAL LOW (ref >60)
Glucose, Bld: 141 mg/dL — ABNORMAL HIGH (ref 70–99)
Potassium: 3.5 mmol/L (ref 3.5–5.1)
Sodium: 141 mmol/L (ref 135–145)
Total Bilirubin: 0.4 mg/dL (ref 0.0–1.2)
Total Protein: 5.2 g/dL — ABNORMAL LOW (ref 6.5–8.1)

## 2024-05-06 LAB — URINALYSIS, ROUTINE W REFLEX MICROSCOPIC
Bilirubin Urine: NEGATIVE
Glucose, UA: NEGATIVE mg/dL
Hgb urine dipstick: NEGATIVE
Ketones, ur: NEGATIVE mg/dL
Leukocytes,Ua: NEGATIVE
Nitrite: NEGATIVE
Protein, ur: 30 mg/dL — AB
Specific Gravity, Urine: 1.017 (ref 1.005–1.030)
pH: 7 (ref 5.0–8.0)

## 2024-05-06 LAB — SALICYLATE LEVEL: Salicylate Lvl: <7 mg/dL — ABNORMAL LOW (ref 7.0–30.0)

## 2024-05-06 LAB — TROPONIN T, HIGH SENSITIVITY
Troponin T High Sensitivity: 36 ng/L — ABNORMAL HIGH (ref 0–19)
Troponin T High Sensitivity: 35 ng/L — ABNORMAL HIGH (ref 0–19)

## 2024-05-06 LAB — ACETAMINOPHEN LEVEL: Acetaminophen (Tylenol), Serum: 14 ug/mL (ref 10–30)

## 2024-05-06 LAB — GLUCOSE, CAPILLARY: Glucose-Capillary: 78 mg/dL (ref 70–99)

## 2024-05-06 LAB — D-DIMER, QUANTITATIVE: D-Dimer, Quant: 7.32 ug{FEU}/mL — ABNORMAL HIGH (ref 0.00–0.50)

## 2024-05-06 MED ORDER — ONDANSETRON HCL 4 MG/2ML IJ SOLN: 4.0000 mg | Freq: Four times a day (QID) | INTRAMUSCULAR | Status: DC | PRN

## 2024-05-06 MED ORDER — ENOXAPARIN SODIUM 40 MG/0.4ML IJ SOSY
40.0000 mg | PREFILLED_SYRINGE | INTRAMUSCULAR | Status: DC
  Filled 2024-05-06: qty 0.4

## 2024-05-06 MED ORDER — SODIUM CHLORIDE 0.9 % IV SOLN: INTRAVENOUS | Status: AC

## 2024-05-06 MED ORDER — PANTOPRAZOLE SODIUM 40 MG PO TBEC
40.0000 mg | DELAYED_RELEASE_TABLET | Freq: Every day | ORAL | Status: DC
  Administered 2024-05-06 – 2024-05-07 (×2): 40 mg via ORAL
  Filled 2024-05-06 (×2): qty 1

## 2024-05-06 MED ORDER — LIDOCAINE VISCOUS HCL 2 % MT SOLN
15.0000 mL | Freq: Once | OROMUCOSAL | Status: AC
  Administered 2024-05-06: 15 mL via ORAL
  Filled 2024-05-06: qty 15

## 2024-05-06 MED ORDER — ACETAMINOPHEN 325 MG PO TABS
650.0000 mg | ORAL_TABLET | Freq: Four times a day (QID) | ORAL | Status: DC | PRN
Start: 2024-05-06 — End: 2024-05-07
  Administered 2024-05-06: 650 mg via ORAL
  Filled 2024-05-06 (×2): qty 2

## 2024-05-06 MED ORDER — INSULIN ASPART 100 UNIT/ML IJ SOLN
0.0000 [IU] | Freq: Three times a day (TID) | INTRAMUSCULAR | Status: DC
  Filled 2024-05-06: qty 2

## 2024-05-06 MED ORDER — TRAZODONE HCL 50 MG PO TABS
50.0000 mg | ORAL_TABLET | Freq: Every evening | ORAL | Status: DC | PRN
  Administered 2024-05-06: 50 mg via ORAL
  Filled 2024-05-06: qty 1

## 2024-05-06 MED ORDER — LORATADINE 10 MG PO TABS
10.0000 mg | ORAL_TABLET | Freq: Every day | ORAL | Status: DC
  Administered 2024-05-06 – 2024-05-07 (×2): 10 mg via ORAL
  Filled 2024-05-06 (×2): qty 1

## 2024-05-06 MED ORDER — ONDANSETRON HCL 4 MG PO TABS: 4.0000 mg | ORAL_TABLET | Freq: Four times a day (QID) | ORAL | Status: DC | PRN

## 2024-05-06 MED ORDER — LIDOCAINE VISCOUS HCL 2 % MT SOLN
15.0000 mL | Freq: Four times a day (QID) | OROMUCOSAL | Status: DC | PRN
  Administered 2024-05-06: 15 mL via ORAL
  Filled 2024-05-06 (×2): qty 15

## 2024-05-06 MED ORDER — ASPIRIN 81 MG PO CHEW
81.0000 mg | CHEWABLE_TABLET | Freq: Every day | ORAL | Status: DC
  Administered 2024-05-06 – 2024-05-07 (×2): 81 mg via ORAL
  Filled 2024-05-06 (×2): qty 1

## 2024-05-06 MED ORDER — LINACLOTIDE 145 MCG PO CAPS
145.0000 ug | ORAL_CAPSULE | Freq: Every day | ORAL | Status: DC
  Administered 2024-05-06 – 2024-05-07 (×2): 145 ug via ORAL
  Filled 2024-05-06 (×2): qty 1

## 2024-05-06 MED ORDER — FERROUS SULFATE 325 (65 FE) MG PO TABS
325.0000 mg | ORAL_TABLET | Freq: Every day | ORAL | Status: DC
  Administered 2024-05-07: 325 mg via ORAL
  Filled 2024-05-06: qty 1

## 2024-05-06 MED ORDER — ALUM & MAG HYDROXIDE-SIMETH 200-200-20 MG/5ML PO SUSP
30.0000 mL | Freq: Once | ORAL | Status: AC
  Administered 2024-05-06: 30 mL via ORAL
  Filled 2024-05-06: qty 30

## 2024-05-06 MED ORDER — DONEPEZIL HCL 10 MG PO TABS
10.0000 mg | ORAL_TABLET | Freq: Every day | ORAL | Status: DC
  Administered 2024-05-06: 10 mg via ORAL
  Filled 2024-05-06: qty 1

## 2024-05-06 MED ORDER — SODIUM CHLORIDE 0.9 % IV SOLN
1.0000 g | Freq: Once | INTRAVENOUS | Status: AC
  Administered 2024-05-06: 1 g via INTRAVENOUS
  Filled 2024-05-06: qty 10

## 2024-05-06 MED ORDER — PANTOPRAZOLE SODIUM 20 MG PO TBEC: 20.0000 mg | DELAYED_RELEASE_TABLET | Freq: Every day | ORAL | Status: DC

## 2024-05-06 MED ORDER — FOLIC ACID 1 MG PO TABS
1.0000 mg | ORAL_TABLET | Freq: Every day | ORAL | Status: DC
  Administered 2024-05-06 – 2024-05-07 (×2): 1 mg via ORAL
  Filled 2024-05-06 (×2): qty 1

## 2024-05-06 MED ORDER — CLOPIDOGREL BISULFATE 75 MG PO TABS
75.0000 mg | ORAL_TABLET | Freq: Every day | ORAL | Status: DC
  Administered 2024-05-06 – 2024-05-07 (×2): 75 mg via ORAL
  Filled 2024-05-06 (×2): qty 1

## 2024-05-06 MED ORDER — ALUM & MAG HYDROXIDE-SIMETH 200-200-20 MG/5ML PO SUSP
30.0000 mL | Freq: Four times a day (QID) | ORAL | Status: DC | PRN
  Administered 2024-05-06: 30 mL via ORAL
  Filled 2024-05-06: qty 30

## 2024-05-06 MED ORDER — ACETAMINOPHEN 650 MG RE SUPP: 650.0000 mg | Freq: Four times a day (QID) | RECTAL | Status: DC | PRN

## 2024-05-06 MED ORDER — SODIUM CHLORIDE 0.9 % IV SOLN
1.0000 g | INTRAVENOUS | Status: DC
  Administered 2024-05-07: 1 g via INTRAVENOUS
  Filled 2024-05-06: qty 10

## 2024-05-06 NOTE — H&P (Signed)
 History and Physical    [name] FMW:[id number] DOB: September 13, 1950 DOA: 05/05/2024  PCP: Pcp, No   Chief Complaint: Altered mental status  HPI: [name] is a 73 y.o. female with medical history significant of diabetes, hypertension, hyperlipidemia, prior stroke who presents emergency department due to altered mental status.  Patient was reportedly acting altered and confused and maybe had taken extra muscle relaxants.  She was brought to the ER where she was found to be afebrile and hemodynamically stable.  Labs were obtained on presentation which showed creatinine 1.2 at baseline, WBC 3.9, hemoglobin 11.1, platelets 85, UDS negative, urinalysis concerning for infection.  Patient underwent CT head which showed no acute findings.  Chest x-ray showed no evidence of infiltrates.  Due to persistent altered mental status patient was given Narcan  with some improvement.  No family was available to provide any collateral information regarding her presentation. On evaluation patient resting comfortable. Arousable but unable to provide hx.    Review of Systems: Review of Systems  Constitutional: Negative.   HENT: Negative.    Respiratory: Negative.    Cardiovascular: Negative.   Gastrointestinal: Negative.   Genitourinary: Negative.   Musculoskeletal: Negative.   Skin: Negative.   Neurological: Negative.   Endo/Heme/Allergies: Negative.   All other systems reviewed and are negative.    As per HPI otherwise 10 point review of systems negative.   No Known Allergies  Past Medical History:  Diagnosis Date   Arthritis    Diabetes mellitus without complication (HCC)    GERD (gastroesophageal reflux disease)    H/O hiatal hernia    Headache(784.0)    Hyperlipidemia    Hypertension    Neuromuscular disorder (HCC)    neuropathy feet    Sleep apnea    recently diagnosed- wears cpap   Stroke (HCC) 06/2019    Past Surgical History:  Procedure Laterality Date    ABDOMINAL HYSTERECTOMY     partial   BACK SURGERY     BUBBLE STUDY  07/11/2019   Procedure: BUBBLE STUDY;  Surgeon: Claudene Pacific, MD;  Location: [facility] ENDOSCOPY;  Service: Cardiovascular;;   CHOLECYSTECTOMY     COLONOSCOPY     age 44- doesnt know where or who    implantable loop recorder placement  10/26/2019   [employer]onic Reveal [place] model [id number] ([id number]) implantable loop recorder implanted by Dr Kelsie for cryptogenic stroke   left shoulder     x 2 surgeries   right shoulder     x2   SHOULDER OPEN ROTATOR CUFF REPAIR Right 08/25/2012   Procedure: ROTATOR CUFF REPAIR SHOULDER OPEN WITH ACROMIOPLASTY;  Surgeon: Tanda DELENA Heading, MD;  Location: [facility] ORS;  Service: Orthopedics;  Laterality: Right;   TEE WITHOUT CARDIOVERSION N/A 07/11/2019   Procedure: TRANSESOPHAGEAL ECHOCARDIOGRAM (TEE);  Surgeon: Claudene Pacific, MD;  Location: [facility] END[facility]COPY;  Service: Cardiovascular;  Laterality: N/A;   TONSILLECTOMY       reports that she has never smoked. She has never used smokeless tobacco. She reports current alcohol  use. She reports that she does not use drugs.  Family History  Problem Relation Age of Onset   Colon cancer Father    Colon cancer Paternal Grandfather    Colon cancer Maternal Grandfather    Bone cancer Maternal Uncle    Colon polyps Neg Hx    Esophageal cancer Neg Hx    Rectal cancer Neg Hx    Stomach cancer Neg Hx     Prior to Admission medications   Medication  Sig Start Date End Date Taking? Authorizing Provider  ACCU-CHEK AVIVA PLUS test strip USE BID FOR GLUCOSE TESTING 12/19/14   [provider]  albuterol  (VENTOLIN  HFA) 108 (90 Base) MCG/ACT inhaler 1 puff inhaled 1-4 times daily PRN for 30 days 02/17/17   [provider]  allopurinol  (ZYLOPRIM ) 100 MG tablet Take 1 tablet (100 mg total) by mouth daily. Patient taking differently: Take 100 mg by mouth daily as needed. 07/12/19 05/04/24  Maree, Pratik D, DO  aspirin  81 MG chewable tablet Chew 1 tablet (81 mg  total) by mouth daily. 09/20/19   Simmons-Robinson, Makiera, MD  atorvastatin  (LIPITOR) 20 MG tablet Take 20 mg by mouth daily. 05/03/19   [provider]  buprenorphine (BUTRANS) 5 MCG/HR PTWK Place 1 patch onto the skin once a week. 01/30/23   [provider]  clopidogrel  (PLAVIX ) 75 MG tablet Take 75 mg by mouth daily.    [provider]  diclofenac  sodium (VOLTAREN ) 1 % GEL Apply 2 g topically daily as needed (pain).  01/17/19   [provider]  donepezil  (ARICEPT ) 10 MG tablet TAKE 1 TABLET BY MOUTH AT BEDTIME Patient taking differently: Take 5 mg by mouth 2 (two) times daily. 02/04/23   Sethi, Pramod S, MD  feeding supplement, GLUCERNA SHAKE, (GLUCERNA SHAKE) LIQD Take 237 mLs by mouth 3 (three) times daily between meals. 09/19/19   Simmons-Robinson, Rockie, MD  ferrous sulfate  325 (65 FE) MG tablet Take 1 tablet (325 mg total) by mouth daily with breakfast. 07/12/19 05/04/24  Maree, Pratik D, DO  folic acid  (FOLVITE ) 1 MG tablet Take 1 tablet (1 mg total) by mouth daily. 11/29/20   Rosemarie Eather RAMAN, MD  gabapentin  (NEURONTIN ) 300 MG capsule Take 300 mg by mouth 3 (three) times daily.    [provider]  losartan  (COZAAR ) 25 MG tablet Take 1 tablet (25 mg total) by mouth daily. 10/23/15   Ricky Fines, MD  meloxicam  (MOBIC ) 15 MG tablet Take 15 mg by mouth daily as needed for pain. 02/04/23   [provider]  MOUNJARO 5 MG/0.5ML Pen Inject 5 mg into the skin once a week. Thursdays 12/31/22   [provider]  oxyCODONE -acetaminophen  (PERCOCET) 10-325 MG tablet Take 1 tablet by mouth 4 (four) times daily as needed.    [provider]  pantoprazole  (PROTONIX ) 20 MG tablet Take 20 mg by mouth daily. 02/04/23   [provider]  SUMAtriptan  (IMITREX ) 50 MG tablet Take 1 tablet (50 mg total) by mouth daily as needed for migraine or headache. 02/08/23   Celestina Czar, MD  tiZANidine  (ZANAFLEX ) 4 MG tablet Take 4 mg by mouth 2 (two)  times daily as needed for muscle spasms.    [provider]  topiramate  (TOPAMAX ) 100 MG tablet TAKE 1 AND 1/2 TABLETS BY MOUTH 2 TIMES DAILY 09/02/23   Rosemarie Eather RAMAN, MD  traZODone  (DESYREL ) 50 MG tablet Take 50 mg by mouth at bedtime as needed for sleep.    [provider]  Vitamin D, Ergocalciferol, (DRISDOL) 1.25 MG (50000 UNIT) CAPS capsule Take 50,000 Units by mouth every 7 (seven) days. Saturdays 01/12/23   [provider]    Physical Exam: Vitals:   05/05/24 2339 05/06/24 0030 05/06/24 0202 05/06/24 0300  BP: (!) 83/60 95/62 91/65  126/70  Pulse: (!) 56 (!) 57 (!) 58 62  Resp: 14 14 17 19   Temp: (!) 97.5 F (36.4 C)     SpO2: 100% 100% 97% 98%   Physical  Exam HENT:     Head: Normocephalic.     Nose: Nose normal.     Mouth/Throat:     Mouth: Mucous membranes are moist.  Eyes:     Conjunctiva/sclera: Conjunctivae normal.     Pupils: Pupils are equal, round, and reactive to light.  Cardiovascular:     Rate and Rhythm: Normal rate and regular rhythm.     Pulses: Normal pulses.     Heart sounds: Normal heart sounds.  Pulmonary:     Effort: Pulmonary effort is normal.     Breath sounds: Normal breath sounds.  Abdominal:     General: Abdomen is flat. Bowel sounds are normal.  Musculoskeletal:        General: Normal range of motion.     Cervical back: Normal range of motion.  Skin:    General: Skin is warm and dry.     Capillary Refill: Capillary refill takes less than 2 seconds.  Neurological:     Mental Status: She is alert. She is disoriented.     Cranial Nerves: No cranial nerve deficit.  Psychiatric:        Mood and Affect: Mood normal.        Labs on Admission: I have personally reviewed the patients's labs and imaging studies.  Assessment/Plan Principal Problem:   Infectious encephalopathy   # Acute infectious encephalopathy most likely secondary to urinary tract infection versus toxic encephalopathy secondary to muscle  relaxants versus opioid - Patient developed altered mental status and reported to take an extra muscle relaxants - Given Narcan  in the emergency department with improvement in mental status - Med rec included buprenorphine  Plan: Treat with ceftriaxone  Hold opioids Hold muscle relaxants  # CAD-continue aspirin .  I was unable to salivate her med rec and unclear if she is still taking Plavix   # Cognitive [place]-continue donepezil  # Insomnia-new as needed trazodone   # Chronic pain-hold gabapentin   # Hypertension-hold losartan  until med rec is completed   Admission status: Inpatient Telemetry  Certification: The appropriate patient status for this patient is INPATIENT. Inpatient status is judged to be reasonable and necessary in order to provide the required intensity of service to ensure the patient's safety. The patient's presenting symptoms, physical exam findings, and initial radiographic and laboratory data in the context of their chronic comorbidities is felt to place them at high risk for further clinical deterioration. Furthermore, it is not anticipated that the patient will be medically stable for discharge from the hospital within 2 midnights of admission.   * I certify that at the point of admission it is my clinical judgment that the patient will require inpatient hospital care spanning beyond 2 midnights from the point of admission due to high intensity of service, high risk for further deterioration and high frequency of surveillance required.DEWAINE Lamar Dess MD [facility] If 7PM-7AM, please contact night-coverage [email or web address]  05/06/2024, 5:38 AM

## 2024-05-06 NOTE — Evaluation (Signed)
 Occupational Therapy Evaluation Patient Details Name: [name] MRN: [id number] DOB: [birth year] Today's Date: 05/06/2024   History of Present Illness   73 yo female presents to therapy following arrival to ED on 05/06/2024 due to AMS. Pt found to have hypotension following medication mismanagement of mm relaxant and infectious encephalopathy presumably due to UTI. Imaging negative for acute findings. Pt PMH includes but is not limited to: DM II, GERD, arthritis, HA, HLD, HTN, neuropathy, OSA on CPAP, CVA, back, shoulder and cardiac surgeries.     Clinical Impressions Pt admitted with above. Prior to admission, pt lives alone in an apartment with grandson checking in daily. Pt able to perform ADLs mod independent, uses RW or SPC intermittently, and family provides assist for transportation / meals. Pt states she uses blister pack for med management and takes meds 2x daily. Pt reports chest pain (RN aware) and mild SOB with mobility with O2 and HR WNL on RA. Pt required CGA for mobility efforts in hallway using RW, no overt LOB and CGA for safety. Pt presents with decreased activity tolerance, anticipate close to baseline for ADL performance. OT will follow to prevent deconditioning and to further assess functional cognition including med mgmt. Pt would benefit from skilled OT services to address noted impairments and functional limitations (see below for any additional details) in order to maximize safety and independence while minimizing falls risk and caregiver burden. Do not anticipate the need for follow up OT services upon acute hospital DC.   Orthostatic VS for the past 24 hrs (Last 3 readings):  BP- Lying Pulse- Lying BP- Sitting Pulse- Sitting BP- Standing at 0 minutes Pulse- Standing at 0 minutes BP- Standing at 3 minutes Pulse- Standing at 3 minutes  05/06/24 1600 150/74 79 163/83 79 146/82 82 155/81 84      If plan is discharge home, recommend the following:   A little  help with walking and/or transfers;Assist for transportation     Functional Status Assessment   Patient has had a recent decline in their functional status and demonstrates the ability to make significant improvements in function in a reasonable and predictable amount of time.     Equipment Recommendations   None recommended by OT      Precautions/Restrictions   Precautions Precautions: Fall (asymtomatic for orthostatics) Restrictions Weight Bearing Restrictions Per Provider Order: No     Mobility Bed Mobility Overal bed mobility: Modified Independent                  Transfers Overall transfer level: Needs assistance Equipment used: None Transfers: Sit to/from Stand Sit to Stand: Contact guard assist           General transfer comment: min cues no overt LOB wide BOS with initial standing      Balance Overall balance assessment: No apparent balance deficits (not formally assessed)                                         ADL either performed or assessed with clinical judgement   ADL Overall ADL's : Needs assistance/impaired Eating/Feeding: Sitting;Independent   Grooming: Standing;Sitting;Supervision/safety   Upper Body Bathing: Sitting;Supervision/ safety   Lower Body Bathing: Sit to/from stand;Sitting/lateral leans;Supervison/ safety   Upper Body Dressing : Sitting;Supervision/safety   Lower Body Dressing: Sitting/lateral leans;Sit to/from stand;Supervision/safety   Toilet Transfer: Supervision/safety;Rolling walker (2 wheels);Ambulation   Toileting- Clothing Manipulation and  Hygiene: Supervision/safety;Sit to/from stand       Functional mobility during ADLs: Supervision/safety;Contact guard assist;Rolling walker (2 wheels);Cueing for sequencing;Cueing for safety General ADL Comments: anticipate pt close to baseline ADL performance      Pertinent Vitals/Pain Pain Assessment Pain Assessment: Faces Faces Pain Scale:  Hurts a little bit Pain Location: chest pain (nurse aware) Pain Descriptors / Indicators: Constant, Discomfort, Dull, Aching Pain Intervention(s): Limited activity within patient's tolerance, Monitored during session     Extremity/Trunk Assessment Upper Extremity Assessment Upper Extremity Assessment: Overall WFL for tasks assessed   Lower Extremity Assessment Lower Extremity Assessment: Overall WFL for tasks assessed   Cervical / Trunk Assessment Cervical / Trunk Assessment: Normal   Communication Communication Communication: No apparent difficulties   Cognition Arousal: Alert Behavior During Therapy: WFL for tasks assessed/performed Cognition: No apparent impairments                               Following commands: Intact       Cueing  General Comments      RN notified of chest pain (aware). Orthostatics assessent.           Home Living Family/patient expects to be discharged to:: Private residence Living Arrangements: Alone Available Help at Discharge: Available PRN/intermittently;Family (grandson checks in daily (lives in building next door)) Type of Home: Apartment Home Access: Level entry     Home Layout: One level     Bathroom Shower/Tub: Chief Strategy Officer: Handicapped height     Home Equipment: Cane - single point;Grab bars - toilet;Grab bars - tub/shower;Rollator (4 wheels)          Prior Functioning/Environment Prior Level of Function : Independent/Modified Independent             Mobility Comments: uses SPC  or rollator intermittently in community, and occassionaly in apt, no falls hx ADLs Comments: independent. daughter and grandson assist with IADLs (transportation, meals, etc)    OT Problem List: Impaired balance (sitting and/or standing);Decreased activity tolerance   OT Treatment/Interventions: Self-care/ADL training;Therapeutic activities;Patient/family education;Balance training      OT  Goals(Current goals can be found in the care plan section)   Acute Rehab OT Goals OT Goal Formulation: With patient Time For Goal Achievement: 05/20/24 Potential to Achieve Goals: Good   OT Frequency:  Min 1X/week    Co-evaluation PT/OT/SLP Co-Evaluation/Treatment: Yes Reason for Co-Treatment: Necessary to address cognition/behavior during functional activity;For patient/therapist safety;Complexity of the patient's impairments (multi-system involvement);To address functional/ADL transfers PT goals addressed during session: Mobility/safety with mobility;Balance;Proper use of DME;Strengthening/ROM OT goals addressed during session: ADL's and self-care;Proper use of Adaptive equipment and DME;Strengthening/ROM      AM-PAC OT 6 Clicks Daily Activity     Outcome Measure Help from another person eating meals?: None Help from another person taking care of personal grooming?: None Help from another person toileting, which includes using toliet, bedpan, or urinal?: A Little Help from another person bathing (including washing, rinsing, drying)?: A Little Help from another person to put on and taking off regular upper body clothing?: A Little Help from another person to put on and taking off regular lower body clothing?: A Little 6 Click Score: 20   End of Session Equipment Utilized During Treatment: Gait belt;Rolling walker (2 wheels) Nurse Communication: Mobility status (chest pain)  Activity Tolerance: Patient tolerated treatment well Patient left: in chair;with call bell/phone within reach;with chair alarm set  OT Visit  Diagnosis: Other abnormalities of gait and mobility (R26.89)                Time: 8440-8379 OT Time Calculation (min): 21 min Charges:  OT General Charges $OT Visit: 1 Visit OT Evaluation $OT Eval Low Complexity: 1 Low  Philamena Kramar L. Elaynah [place], OTR/L  05/06/24, 5:45 PM

## 2024-05-06 NOTE — Progress Notes (Signed)
 PROGRESS NOTE    [name]  FMW:[id number] DOB: 02-Feb-1951 DOA: 05/05/2024 PCP: Pcp, No    Chief Complaint  Patient presents with   Hypotension    Brief Narrative:  Patient 73 year old female history of diabetes, hypertension, hyperlipidemia, prior CVA presented to the ED with altered mental status.  It was noted that patient may have taken an extra dose of her muscle relaxants.  Patient also with some complaints of urinary frequency and urgency.  CT head done with no acute abnormalities.  Chest x-ray negative for any acute infiltrates.  Urinalysis nitrite negative, leukocytes negative.  Patient received a dose of Narcan  in the ED with some improvement.  Patient placed empirically on IV antibiotics due to concerns for possible UTI.   Assessment & Plan:   Principal Problem:   Acute metabolic encephalopathy Active Problems:   Benign essential HTN   GERD (gastroesophageal reflux disease)   Diabetes mellitus with nephropathy (HCC)   Chest pain   Diabetic neuropathy (HCC)   Chronic idiopathic thrombocytopenia (HCC)   AKI (acute kidney injury)  #1 acute metabolic encephalopathy -Patient presented with altered mental status per family, concern patient may have taken extra dose of her muscle relaxants which patient endorses. - Patient also noted with some complaints of urinary frequency and urgency. - Patient noted to have received a dose of Narcan  in the ED. - Urinalysis cloudy, nitrite negative, leukocytes negative, many bacteria, 0-5 WBCs. - Urine cultures pending. - Chest x-ray unremarkable. - Patient with clinical improvement, alert and oriented x 4. - Continue empiric IV antibiotics pending urine culture results.  2.  Epigastric discomfort/?  Chest pain - Patient with complaints of epigastric discomfort described more as a burning sensation however kept telling nurses that she is having some chest pain with deep inspiration. - GI cocktail ordered earlier this  morning with some clinical improvement however later on this afternoon patient with complaints of chest pain again. - Check troponins, EKG, D-dimer. - If D-dimer is elevated we will get a CT angiogram chest to rule out PE. -Increase PPI to 40 mg daily. - GI cocktail as needed. - Nitroglycerin as needed  3.  Chronic thrombocytopenia -Patient with no overt bleeding. - Stable.  4.  CAD -Patient with some complaints of epigastric pain/chest discomfort. - Check EKG, cycle cardiac enzymes, check a D-dimer. - GI cocktail as needed. - See #2. - Continue aspirin . - Resume home regimen Plavix .  5.  Cognitive impairment -Continue home regimen Aricept .  6.  Insomnia -Continue as needed trazodone .  7.  Chronic pain -Resume home regimen gabapentin .  8.  Hypertension - BP noted to be soft on admission however improving. - Continue to hold antihypertensive medications.  9.  GERD -Increase PPI to 40 mg daily. - GI cocktail as needed.  10.  Diabetes mellitus type 2 - Hemoglobin A1c 5.9. - Patient noted to be on glipizide prior to admission which is currently on hold. - CBG 138 on admission. - SSI.   DVT prophylaxis: SCDs Code Status: Full Family Communication: Updated patient.  No family at bedside. Disposition: Likely home when clinically improved.  Status is: Inpatient Remains inpatient appropriate because: Severity of illness   Consultants:  None  Procedures:  CT head 05/06/2024 Chest x-ray 05/06/2024   Antimicrobials:  Anti-infectives (From admission, onward)    Start     Dose/Rate Route Frequency Ordered Stop   05/07/24 0500  cefTRIAXone  (ROCEPHIN ) 1 g in sodium chloride  0.9 % 100 mL IVPB  1 g 200 mL/hr over 30 Minutes Intravenous Every 24 hours 05/06/24 0538     05/06/24 0530  cefTRIAXone  (ROCEPHIN ) 1 g in sodium chloride  0.9 % 100 mL IVPB        1 g 200 mL/hr over 30 Minutes Intravenous  Once 05/06/24 0523 05/06/24 9391         Subjective: Patient  sitting up on gurney and oriented to self place and time.  Denies any substernal chest pain, no shortness of breath.  Some complaints of midsternal epigastric burning/discomfort.  States feels like her reflux.  Patient denies any fever or chills.  Patient states may have taken the extra dose of muscle relaxant.  Patient does endorse urinary urgency and frequency.  Denies any dysuria.  Objective: Vitals:   05/06/24 1103 05/06/24 1259 05/06/24 1455 05/06/24 1623  BP:  133/79 (!) 148/74   Pulse:  69 85   Resp:  16    Temp: (!) 97.5 F (36.4 C)  98.2 F (36.8 C)   TempSrc: Oral  Oral   SpO2:  100% 100%   Weight:    72 kg  Height:    5' 3 (1.6 m)    Intake/Output Summary (Last 24 hours) at 05/06/2024 1719 Last data filed at 05/06/2024 0608 Gross per 24 hour  Intake 100 ml  Output --  Net 100 ml   Filed Weights   05/06/24 1623  Weight: 72 kg    Examination:  General exam: Appears calm and comfortable  Respiratory system: Clear to auscultation. Respiratory effort normal. Cardiovascular system: S1 & S2 heard, RRR. No JVD, murmurs, rubs, gallops or clicks. No pedal edema. Gastrointestinal system: Abdomen is nondistended, soft and nontender. No organomegaly or masses felt. Normal bowel sounds heard. Central nervous system: Alert and oriented. No focal neurological deficits. Extremities: Symmetric 5 x 5 power. Skin: No rashes, lesions or ulcers Psychiatry: Judgement and insight appear normal. Mood & affect appropriate.     Data Reviewed: I have personally reviewed following labs and imaging studies  CBC: Recent Labs  Lab 05/04/24 1055 05/06/24 0013  WBC 4.5 3.9*  NEUTROABS 2.0 1.5*  HGB 14.3 11.1*  HCT 42.5 34.6*  MCV 91.2 95.8  PLT 145.0* 85*    Basic Metabolic Panel: Recent Labs  Lab 05/04/24 1055 05/06/24 0013  NA 140 141  K 4.4 3.5  CL 105 110  CO2 28 23  GLUCOSE 122* 141*  BUN 26* 28*  CREATININE 1.39* 1.20*  CALCIUM  10.5 9.1    GFR: Estimated  Creatinine Clearance: 40.9 mL/min (A) (by C-G formula based on SCr of 1.2 mg/dL (H)).  Liver Function Tests: Recent Labs  Lab 05/04/24 1055 05/06/24 0013  AST 20 20  ALT 34 31  ALKPHOS 67 61  BILITOT 0.7 0.4  PROT 7.2 5.2*  ALBUMIN 3.8 3.1*    CBG: Recent Labs  Lab 05/05/24 2344  GLUCAP 138*     No results found for this or any previous visit (from the past 240 hours).       Radiology Studies: CT Head Wo Contrast Result Date: 05/06/2024 EXAM: CT HEAD WITHOUT CONTRAST 05/06/2024 02:36:36 AM TECHNIQUE: CT of the head was performed without the administration of intravenous contrast. Automated exposure control, iterative reconstruction, and/or weight based adjustment of the mA/kV was utilized to reduce the radiation dose to as low as reasonably achievable. COMPARISON: CT head 01/23/2024 CLINICAL HISTORY: Polytrauma, blunt FINDINGS: BRAIN AND VENTRICLES: No acute hemorrhage. No evidence of acute infarct. No hydrocephalus. No extra-axial  collection. No mass effect or midline shift. ORBITS: No acute abnormality. SINUSES: No acute abnormality. SOFT TISSUES AND SKULL: No acute soft tissue abnormality. No skull fracture. IMPRESSION: No acute intracranial abnormality. Electronically signed by: Gilmore Molt MD 05/06/2024 02:52 AM EST RP Workstation: [id number]   DG Chest Portable 1 View Result Date: 05/06/2024 EXAM: 1 VIEW(S) XRAY OF THE CHEST 05/06/2024 12:22:00 AM COMPARISON: 02/07/2023 CLINICAL HISTORY: painetc FINDINGS: LUNGS AND PLEURA: Low lung volumes. No focal pulmonary opacity. No pleural effusion. No pneumothorax. HEART AND MEDIASTINUM: Cardiac loop recorder noted. No acute abnormality of the cardiac and mediastinal silhouettes. BONES AND SOFT TISSUES: No acute osseous abnormality. IMPRESSION: 1. No acute findings. 2. Low lung volumes. Electronically signed by: Dorethia Molt MD 05/06/2024 12:25 AM EST RP Workstation: [id number]        Scheduled Meds:  aspirin   81 mg Oral  Daily   clopidogrel   75 mg Oral Daily   donepezil   10 mg Oral QHS   [START ON 05/07/2024] ferrous sulfate   325 mg Oral Q breakfast   folic acid   1 mg Oral Daily   linaclotide   145 mcg Oral Daily   loratadine   10 mg Oral Daily   pantoprazole   40 mg Oral Daily   Continuous Infusions:  sodium chloride  125 mL/hr at 05/06/24 1520   [START ON 05/07/2024] cefTRIAXone  (ROCEPHIN )  IV       LOS: 0 days    Time spent: 40 minutes    Toribio Hummer, MD [facility]   To contact the attending provider between 7A-7P or the covering provider during after hours 7P-7A, please log into the web site [email or web address] and access using universal [place] password for that web site. If you do not have the password, please call the hospital operator.  05/06/2024, 5:19 PM

## 2024-05-06 NOTE — Plan of Care (Signed)
   Problem: Education: Goal: Knowledge of General Education information will improve Description Including pain rating scale, medication(s)/side effects and non-pharmacologic comfort measures Outcome: Progressing

## 2024-05-06 NOTE — ED Provider Notes (Signed)
 [place] EMERGENCY DEPARTMENT AT [facility] Provider Note   CSN: [id number] Arrival date & time: 05/05/24  2332     Patient presents with: Hypotension   [name] is a 73 y.o. female.   The history is provided by the EMS personnel. The history is limited by the condition of the patient (dementia).  Altered Mental Status Presenting symptoms: confusion and partial responsiveness   Severity:  Moderate Most recent episode:  Today Episode history:  Single Timing:  Constant Progression:  Unchanged Context: not homeless and not recent infection   Associated symptoms: no rash   Patient with dementia BIB EMS for AMS and hypotension after reportedly taking an unknown muscle relaxant.     Past Medical History:  Diagnosis Date   Arthritis    Diabetes mellitus without complication (HCC)    GERD (gastroesophageal reflux disease)    H/O hiatal hernia    Headache(784.0)    Hyperlipidemia    Hypertension    Neuromuscular disorder (HCC)    neuropathy feet    Sleep apnea    recently diagnosed- wears cpap   Stroke (HCC) 06/2019     Prior to Admission medications   Medication Sig Start Date End Date Taking? Authorizing Provider  ACCU-CHEK AVIVA PLUS test strip USE BID FOR GLUCOSE TESTING 12/19/14   [provider]  albuterol  (VENTOLIN  HFA) 108 (90 Base) MCG/ACT inhaler 1 puff inhaled 1-4 times daily PRN for 30 days 02/17/17   [provider]  allopurinol  (ZYLOPRIM ) 100 MG tablet Take 1 tablet (100 mg total) by mouth daily. Patient taking differently: Take 100 mg by mouth daily as needed. 07/12/19 05/04/24  Maree, Pratik D, DO  aspirin  81 MG chewable tablet Chew 1 tablet (81 mg total) by mouth daily. 09/20/19   Simmons-Robinson, Makiera, MD  atorvastatin  (LIPITOR) 20 MG tablet Take 20 mg by mouth daily. 05/03/19   [provider]  buprenorphine (BUTRANS) 5 MCG/HR PTWK Place 1 patch onto the skin once a week. 01/30/23   [provider]  clopidogrel  (PLAVIX ) 75 MG tablet Take 75 mg by mouth daily.    [provider]  diclofenac  sodium (VOLTAREN ) 1 % GEL Apply 2 g topically daily as needed (pain).  01/17/19   [provider]  donepezil  (ARICEPT ) 10 MG tablet TAKE 1 TABLET BY MOUTH AT BEDTIME Patient taking differently: Take 5 mg by mouth 2 (two) times daily. 02/04/23   Sethi, Pramod S, MD  feeding supplement, GLUCERNA SHAKE, (GLUCERNA SHAKE) LIQD Take 237 mLs by mouth 3 (three) times daily between meals. 09/19/19   Simmons-Robinson, Rockie, MD  ferrous sulfate  325 (65 FE) MG tablet Take 1 tablet (325 mg total) by mouth daily with breakfast. 07/12/19 05/04/24  Maree, Pratik D, DO  folic acid  (FOLVITE ) 1 MG tablet Take 1 tablet (1 mg total) by mouth daily. 11/29/20   Rosemarie Eather RAMAN, MD  gabapentin  (NEURONTIN ) 300 MG capsule Take 300 mg by mouth 3 (three) times daily.    [provider]  losartan  (COZAAR ) 25 MG tablet Take 1 tablet (25 mg total) by mouth daily. 10/23/15   Ricky Fines, MD  meloxicam  (MOBIC ) 15 MG tablet Take 15 mg by mouth daily as needed for pain. 02/04/23   [provider]  MOUNJARO 5 MG/0.5ML Pen Inject 5 mg into the skin once a week. Thursdays 12/31/22   [provider]  oxyCODONE -acetaminophen  (PERCOCET) 10-325 MG tablet Take 1 tablet by mouth 4 (four) times daily as needed.  [provider]  pantoprazole  (PROTONIX ) 20 MG tablet Take 20 mg by mouth daily. 02/04/23   [provider]  SUMAtriptan  (IMITREX ) 50 MG tablet Take 1 tablet (50 mg total) by mouth daily as needed for migraine or headache. 02/08/23   Celestina Czar, MD  tiZANidine  (ZANAFLEX ) 4 MG tablet Take 4 mg by mouth 2 (two) times daily as needed for muscle spasms.    [provider]  topiramate  (TOPAMAX ) 100 MG tablet TAKE 1 AND 1/2 TABLETS BY MOUTH 2 TIMES DAILY 09/02/23   Sethi, Pramod S, MD  traZODone  (DESYREL ) 50 MG tablet Take 50 mg by mouth at bedtime as needed for sleep.     [provider]  Vitamin D, Ergocalciferol, (DRISDOL) 1.25 MG (50000 UNIT) CAPS capsule Take 50,000 Units by mouth every 7 (seven) days. Saturdays 01/12/23   [provider]    Allergies: Patient has no known allergies.    Review of Systems  Unable to perform ROS: Mental status change  Skin:  Negative for rash.  Psychiatric/Behavioral:  Positive for confusion.     Updated Vital Signs BP 126/70   Pulse 62   Temp (!) 97.5 F (36.4 C)   Resp 19   SpO2 98%   Physical Exam Vitals and nursing note reviewed.  Constitutional:      General: She is not in acute distress.    Appearance: Normal appearance. She is well-developed.     Comments: Somnolent but arouses, pinpoint pupils   HENT:     Head: Normocephalic and atraumatic.     Nose: Nose normal.     Mouth/Throat:     Mouth: Mucous membranes are moist.     Pharynx: Oropharynx is clear.  Eyes:     Pupils: Pupils are equal, round, and reactive to light.     Comments: Pinpoint pupils prior to narcan  with response post narcan    Cardiovascular:     Rate and Rhythm: Normal rate and regular rhythm.     Pulses: Normal pulses.     Heart sounds: Normal heart sounds.  Pulmonary:     Effort: Pulmonary effort is normal. No respiratory distress.     Breath sounds: Normal breath sounds.  Abdominal:     General: Bowel sounds are normal. There is no distension.     Palpations: Abdomen is soft.     Tenderness: There is no abdominal tenderness. There is no guarding or rebound.  Musculoskeletal:        General: Normal range of motion.     Cervical back: Normal range of motion and neck supple.  Skin:    General: Skin is dry.     Capillary Refill: Capillary refill takes less than 2 seconds.     Findings: No erythema or rash.  Neurological:     General: No focal deficit present.     Deep Tendon Reflexes: Reflexes normal.  Psychiatric:        Mood and Affect: Mood normal.     (all labs ordered are listed, but only  abnormal results are displayed) Results for orders placed or performed during the hospital encounter of 05/05/24  CBG monitoring, ED   Collection Time: 05/05/24 11:44 PM  Result Value Ref Range   Glucose-Capillary 138 (H) 70 - 99 mg/dL  Comprehensive metabolic panel   Collection Time: 05/06/24 12:13 AM  Result Value Ref Range   Sodium 141 135 - 145 mmol/L   Potassium 3.5 3.5 - 5.1 mmol/L   Chloride 110 98 - 111  mmol/L   CO2 23 22 - 32 mmol/L   Glucose, Bld 141 (H) 70 - 99 mg/dL   BUN 28 (H) 8 - 23 mg/dL   Creatinine, Ser 8.79 (H) 0.44 - 1.00 mg/dL   Calcium  9.1 8.9 - 10.3 mg/dL   Total Protein 5.2 (L) 6.5 - 8.1 g/dL   Albumin 3.1 (L) 3.5 - 5.0 g/dL   AST 20 15 - 41 U/L   ALT 31 0 - 44 U/L   Alkaline Phosphatase 61 38 - 126 U/L   Total Bilirubin 0.4 0.0 - 1.2 mg/dL   GFR, Estimated 48 (L) >60 mL/min   Anion gap 8 5 - 15  Salicylate level   Collection Time: 05/06/24 12:13 AM  Result Value Ref Range   Salicylate Lvl <7.0 (L) 7.0 - 30.0 mg/dL  Acetaminophen  level   Collection Time: 05/06/24 12:13 AM  Result Value Ref Range   Acetaminophen  (Tylenol ), Serum 14 10 - 30 ug/mL  Ethanol   Collection Time: 05/06/24 12:13 AM  Result Value Ref Range   Alcohol , Ethyl (B) <15 <15 mg/dL  CBC WITH DIFFERENTIAL   Collection Time: 05/06/24 12:13 AM  Result Value Ref Range   WBC 3.9 (L) 4.0 - 10.5 K/uL   RBC 3.61 (L) 3.87 - 5.11 MIL/uL   Hemoglobin 11.1 (L) 12.0 - 15.0 g/dL   HCT 65.3 (L) 63.9 - 53.9 %   MCV 95.8 80.0 - 100.0 fL   MCH 30.7 26.0 - 34.0 pg   MCHC 32.1 30.0 - 36.0 g/dL   RDW 86.8 88.4 - 84.4 %   Platelets 85 (L) 150 - 400 K/uL   nRBC 0.0 0.0 - 0.2 %   Neutrophils Relative % 39 %   Neutro Abs 1.5 (L) 1.7 - 7.7 K/uL   Lymphocytes Relative 41 %   Lymphs Abs 1.6 0.7 - 4.0 K/uL   Monocytes Relative 13 %   Monocytes Absolute 0.5 0.1 - 1.0 K/uL   Eosinophils Relative 6 %   Eosinophils Absolute 0.2 0.0 - 0.5 K/uL   Basophils Relative 1 %   Basophils Absolute 0.0 0.0 - 0.1  K/uL   RBC Morphology MORPHOLOGY UNREMARKABLE    Smear Review Normal platelet morphology    Immature Granulocytes 0 %   Abs Immature Granulocytes 0.01 0.00 - 0.07 K/uL   Reactive, Benign Lymphocytes PRESENT   Urine rapid drug screen (hosp performed)   Collection Time: 05/06/24 12:35 AM  Result Value Ref Range   Opiates NEGATIVE NEGATIVE   Cocaine NEGATIVE NEGATIVE   Benzodiazepines NEGATIVE NEGATIVE   Amphetamines NEGATIVE NEGATIVE   Tetrahydrocannabinol NEGATIVE NEGATIVE   Barbiturates NEGATIVE NEGATIVE   Methadone Scn, Ur NEGATIVE NEGATIVE   Fentanyl  NEGATIVE NEGATIVE  Urinalysis, Routine w reflex microscopic -Urine, Catheterized   Collection Time: 05/06/24 12:35 AM  Result Value Ref Range   Color, Urine YELLOW YELLOW   APPearance CLOUDY (A) CLEAR   Specific Gravity, Urine 1.017 1.005 - 1.030   pH 7.0 5.0 - 8.0   Glucose, UA NEGATIVE NEGATIVE mg/dL   Hgb urine dipstick NEGATIVE NEGATIVE   Bilirubin Urine NEGATIVE NEGATIVE   Ketones, ur NEGATIVE NEGATIVE mg/dL   Protein, ur 30 (A) NEGATIVE mg/dL   Nitrite NEGATIVE NEGATIVE   Leukocytes,Ua NEGATIVE NEGATIVE   RBC / HPF 0-5 0 - 5 RBC/hpf   WBC, UA 0-5 0 - 5 WBC/hpf   Bacteria, UA MANY (A) NONE SEEN   Squamous Epithelial / HPF 0-5 0 - 5 /HPF   Mucus PRESENT  Hyaline Casts, UA PRESENT    Amorphous Crystal PRESENT    Ca Oxalate Crys, UA PRESENT    CT Head Wo Contrast Result Date: 05/06/2024 EXAM: CT HEAD WITHOUT CONTRAST 05/06/2024 02:36:36 AM TECHNIQUE: CT of the head was performed without the administration of intravenous contrast. Automated exposure control, iterative reconstruction, and/or weight based adjustment of the mA/kV was utilized to reduce the radiation dose to as low as reasonably achievable. COMPARISON: CT head 01/23/2024 CLINICAL HISTORY: Polytrauma, blunt FINDINGS: BRAIN AND VENTRICLES: No acute hemorrhage. No evidence of acute infarct. No hydrocephalus. No extra-axial collection. No mass effect or midline  shift. ORBITS: No acute abnormality. SINUSES: No acute abnormality. SOFT TISSUES AND SKULL: No acute soft tissue abnormality. No skull fracture. IMPRESSION: No acute intracranial abnormality. Electronically signed by: Gilmore Molt MD 05/06/2024 02:52 AM EST RP Workstation: [id number]   DG Knee AP/LAT W/Sunrise Left Result Date: 05/06/2024 EXAM: 3 VIEW(S) XRAY OF THE L KNEE 05/04/2024 10:55:38 AM COMPARISON: None available. CLINICAL HISTORY: L knee pain FINDINGS: BONES AND JOINTS: No acute fracture. No focal osseous lesion. No joint dislocation. No significant joint effusion. Marginal osteophytes. Mild medial compartment joint space narrowing. Patellofemoral joint space narrowing. Small quadriceps tendon enthesophytes. SOFT TISSUES: The soft tissues are unremarkable. IMPRESSION: 1. Mild medial compartment and patellofemoral joint space narrowing with marginal osteophytes, consistent with mild bicompartmental osteoarthritis. 2. Small quadriceps tendon enthesophytes. Electronically signed by: Dorethia Molt MD 05/06/2024 12:26 AM EST RP Workstation: [id number]   DG Chest Portable 1 View Result Date: 05/06/2024 EXAM: 1 VIEW(S) XRAY OF THE CHEST 05/06/2024 12:22:00 AM COMPARISON: 02/07/2023 CLINICAL HISTORY: painetc FINDINGS: LUNGS AND PLEURA: Low lung volumes. No focal pulmonary opacity. No pleural effusion. No pneumothorax. HEART AND MEDIASTINUM: Cardiac loop recorder noted. No acute abnormality of the cardiac and mediastinal silhouettes. BONES AND SOFT TISSUES: No acute osseous abnormality. IMPRESSION: 1. No acute findings. 2. Low lung volumes. Electronically signed by: Dorethia Molt MD 05/06/2024 12:25 AM EST RP Workstation: [id number]    EKG: EKG Interpretation Date/Time:  Thursday May 05 2024 23:44:55 EST Ventricular Rate:  58 PR Interval:  232 QRS Duration:  87 QT Interval:  472 QTC Calculation: 464 R Axis:   27  Text Interpretation: Sinus rhythm Prolonged PR interval Confirmed by  Jakirah Zaun ([id number]) on 05/05/2024 11:47:41 PM  Radiology: CT Head Wo Contrast Result Date: 05/06/2024 EXAM: CT HEAD WITHOUT CONTRAST 05/06/2024 02:36:36 AM TECHNIQUE: CT of the head was performed without the administration of intravenous contrast. Automated exposure control, iterative reconstruction, and/or weight based adjustment of the mA/kV was utilized to reduce the radiation dose to as low as reasonably achievable. COMPARISON: CT head 01/23/2024 CLINICAL HISTORY: Polytrauma, blunt FINDINGS: BRAIN AND VENTRICLES: No acute hemorrhage. No evidence of acute infarct. No hydrocephalus. No extra-axial collection. No mass effect or midline shift. ORBITS: No acute abnormality. SINUSES: No acute abnormality. SOFT TISSUES AND SKULL: No acute soft tissue abnormality. No skull fracture. IMPRESSION: No acute intracranial abnormality. Electronically signed by: Gilmore Molt MD 05/06/2024 02:52 AM EST RP Workstation: [id number]   DG Knee AP/LAT W/Sunrise Left Result Date: 05/06/2024 EXAM: 3 VIEW(S) XRAY OF THE L KNEE 05/04/2024 10:55:38 AM COMPARISON: None available. CLINICAL HISTORY: L knee pain FINDINGS: BONES AND JOINTS: No acute fracture. No focal osseous lesion. No joint dislocation. No significant joint effusion. Marginal osteophytes. Mild medial compartment joint space narrowing. Patellofemoral joint space narrowing. Small quadriceps tendon enthesophytes. SOFT TISSUES: The soft tissues are unremarkable. IMPRESSION: 1. Mild medial compartment and patellofemoral joint space narrowing with marginal osteophytes,  consistent with mild bicompartmental osteoarthritis. 2. Small quadriceps tendon enthesophytes. Electronically signed by: Dorethia Molt MD 05/06/2024 12:26 AM EST RP Workstation: [id number]   DG Chest Portable 1 View Result Date: 05/06/2024 EXAM: 1 VIEW(S) XRAY OF THE CHEST 05/06/2024 12:22:00 AM COMPARISON: 02/07/2023 CLINICAL HISTORY: painetc FINDINGS: LUNGS AND PLEURA: Low lung volumes. No focal  pulmonary opacity. No pleural effusion. No pneumothorax. HEART AND MEDIASTINUM: Cardiac loop recorder noted. No acute abnormality of the cardiac and mediastinal silhouettes. BONES AND SOFT TISSUES: No acute osseous abnormality. IMPRESSION: 1. No acute findings. 2. Low lung volumes. Electronically signed by: Dorethia Molt MD 05/06/2024 12:25 AM EST RP Workstation: [id number]     Procedures   Medications Ordered in the ED  sodium chloride  0.9 % bolus 1,000 mL (0 mLs Intravenous Stopped 05/06/24 0312)    And  0.9 %  sodium chloride  infusion ( Intravenous New Bag/Given 05/06/24 0059)  naloxone  (NARCAN ) 0.4 MG/ML injection (  Not Given 05/05/24 2351)  naloxone  (NARCAN ) 2 MG/2ML injection (  Not Given 05/06/24 0043)  cefTRIAXone  (ROCEPHIN ) 1 g in sodium chloride  0.9 % 100 mL IVPB (1 g Intravenous New Bag/Given 05/06/24 0538)  aspirin  chewable tablet 81 mg (has no administration in time range)  donepezil  (ARICEPT ) tablet 10 mg (has no administration in time range)  traZODone  (DESYREL ) tablet 50 mg (has no administration in time range)  enoxaparin  (LOVENOX ) injection 40 mg (has no administration in time range)  acetaminophen  (TYLENOL ) tablet 650 mg (has no administration in time range)    Or  acetaminophen  (TYLENOL ) suppository 650 mg (has no administration in time range)  ondansetron  (ZOFRAN ) tablet 4 mg (has no administration in time range)    Or  ondansetron  (ZOFRAN ) injection 4 mg (has no administration in time range)  cefTRIAXone  (ROCEPHIN ) 1 g in sodium chloride  0.9 % 100 mL IVPB (has no administration in time range)  naloxone  (NARCAN ) injection 2 mg (2 mg Intravenous Given 05/05/24 2344)                                    Medical Decision Making Patient BIB for hypotension post muscle relaxant   Amount and/or Complexity of Data Reviewed Independent Historian: EMS    Details: See above  External Data Reviewed: notes.    Details: Previous notes reviewed  Labs: ordered.    Details:  Uti on urine. Normal sodium 141, potassium 3.5, slight creatinine 1.2 .UDS is negative. Negative ETOH.  Negative tylenol  and salicylate  Radiology: ordered and independent interpretation performed.    Details: No acute finding on CT  ECG/medicine tests: ordered and independent interpretation performed. Decision-making details documented in ED Course.  Risk Prescription drug management. Decision regarding hospitalization. Risk Details: Patient with multiple issues.  BP and alertness improved with narcan .  Given IVF in the ED.  Will admit      Final diagnoses:  Pancytopenia (HCC)  AKI (acute kidney injury)  Hypotension, unspecified hypotension type  Adverse effect of drug, initial encounter  Acute cystitis without hematuria   The patient appears reasonably stabilized for admission considering the current resources, flow, and capabilities available in the ED at this time, and I doubt any other [facility] requiring further screening and/or treatment in the ED prior to admission.  ED Discharge Orders     None          Lashann Hagg, MD 05/06/24 [phone]

## 2024-05-06 NOTE — Evaluation (Signed)
 Physical Therapy Evaluation Patient Details Name: [name] MRN: [id number] DOB: [birth year] Today's Date: 05/06/2024  History of Present Illness  73 yo female presents to therapy following arrival to ED on 05/06/2024 due to AMS. Pt found to have hypotension following medication mismanagement of mm relaxant and  infectious encephalopathy presumably due to UTI. Imaging negative for acute findings. Pt PMH includes but is not limited to: DM II, GERD, arthritis, HA, HLD, HTN, neuropathy, OSA on CPAP, CVA, back, shoulder and cardiac surgeries.  Clinical Impression      Pt admitted with above diagnosis.  Pt currently with functional limitations due to the deficits listed below (see PT Problem List). Pt in bed, alert and agreeable to therapy. Pt reports mild angina and SOB with gait tasks. Pt is mod I for supine to sit, CGA for sit to stand  from EOB no AD, pt denies dizziness, however positive findings for orthostatic hypotension please see flow sheet, gait tasks with CGA progressing to close S with RW, no overt LOB mild gait abnormalities and decreased cadence for 180 feet. Pt left seated in recliner, all needs in place with nurse aware of pt reported angina.  Pt will benefit from acute skilled PT to increase their independence and safety with mobility to allow discharge.       If plan is discharge home, recommend the following: A little help with walking and/or transfers;A lot of help with bathing/dressing/bathroom;Assistance with cooking/housework;Assist for transportation   Can travel by private vehicle        Equipment Recommendations None recommended by PT  Recommendations for Other Services       Functional Status Assessment Patient has had a recent decline in their functional status and demonstrates the ability to make significant improvements in function in a reasonable and predictable amount of time.     Precautions / Restrictions Precautions Precautions: Fall  (asymtomatic for orthostatics) Restrictions Weight Bearing Restrictions Per Provider Order: No      Mobility  Bed Mobility Overal bed mobility: Modified Independent                  Transfers Overall transfer level: Needs assistance Equipment used: None Transfers: Sit to/from Stand Sit to Stand: Contact guard assist           General transfer comment: min cues no overt LOB wide BOS with initial standing    Ambulation/Gait Ambulation/Gait assistance: Contact guard assist, Supervision Gait Distance (Feet): 180 Feet Assistive device: Rolling walker (2 wheels) Gait Pattern/deviations: Step-through pattern Gait velocity: decreased     General Gait Details: good reciprocal pattern, LE passing in stance phase, appropriate foot clearance and no overt LOB, c/o angina and SOB O2 saturation 96-98% on RA and HR 83-85  Stairs            Wheelchair Mobility     Tilt Bed    Modified Rankin (Stroke Patients Only)       Balance Overall balance assessment: No apparent balance deficits (not formally assessed)                                           Pertinent Vitals/Pain Pain Assessment Pain Assessment: Faces Faces Pain Scale: Hurts a little bit Pain Location: chest pain (nurse aware) Pain Descriptors / Indicators: Constant, Discomfort, Dull, Aching Pain Intervention(s): Limited activity within patient's tolerance, Monitored during session    Home Living  Family/patient expects to be discharged to:: Private residence Living Arrangements: Alone Available Help at Discharge: Available PRN/intermittently;Family (grandson checks in daily (lives in building next door)) Type of Home: Apartment Home Access: Level entry       Home Layout: One level Home Equipment: Cane - single point;Grab bars - toilet;Grab bars - tub/shower;Rollator (4 wheels)      Prior Function Prior Level of Function : Independent/Modified Independent              Mobility Comments: uses SPC  or rollator intermittently in community, and occassionaly in apt, no falls hx ADLs Comments: independent. daughter and grandson assist with IADLs (transportation, meals, etc)     Extremity/Trunk Assessment        Lower Extremity Assessment Lower Extremity Assessment: Overall WFL for tasks assessed    Cervical / Trunk Assessment Cervical / Trunk Assessment: Normal  Communication   Communication Communication: No apparent difficulties    Cognition Arousal: Alert Behavior During Therapy: WFL for tasks assessed/performed   PT - Cognitive impairments: No apparent impairments                         Following commands: Intact       Cueing       General Comments      Exercises     Assessment/Plan    PT Assessment Patient needs continued PT services  PT Problem List Decreased activity tolerance;Decreased balance;Decreased mobility;Decreased coordination;Cardiopulmonary status limiting activity       PT Treatment Interventions DME instruction;Gait training;Functional mobility training;Therapeutic activities;Therapeutic exercise;Balance training;Neuromuscular re-education;Patient/family education    PT Goals (Current goals can be found in the Care Plan section)  Acute Rehab PT Goals Patient Stated Goal: to go home PT Goal Formulation: With patient Time For Goal Achievement: 05/20/24 Potential to Achieve Goals: Good    Frequency Min 3X/week     Co-evaluation PT/OT/SLP Co-Evaluation/Treatment: Yes Reason for Co-Treatment: Necessary to address cognition/behavior during functional activity;For patient/therapist safety;Complexity of the patient's impairments (multi-system involvement);To address functional/ADL transfers PT goals addressed during session: Mobility/safety with mobility;Balance;Proper use of DME;Strengthening/ROM OT goals addressed during session: ADL's and self-care;Proper use of Adaptive equipment and  DME;Strengthening/ROM       AM-PAC PT 6 Clicks Mobility  Outcome Measure Help needed turning from your back to your side while in a flat bed without using bedrails?: None Help needed moving from lying on your back to sitting on the side of a flat bed without using bedrails?: None Help needed moving to and from a bed to a chair (including a wheelchair)?: A Little Help needed standing up from a chair using your arms (e.g., wheelchair or bedside chair)?: A Little Help needed to walk in hospital room?: A Little Help needed climbing 3-5 steps with a railing? : Total 6 Click Score: 18    End of Session Equipment Utilized During Treatment: Gait belt Activity Tolerance: Patient tolerated treatment well Patient left: in chair;with call bell/phone within reach Nurse Communication: Mobility status PT Visit Diagnosis: Other abnormalities of gait and mobility (R26.89);Difficulty in walking, not elsewhere classified (R26.2)    Time: 8441-8378 PT Time Calculation (min) (ACUTE ONLY): 23 min   Charges:   PT Evaluation $PT Eval Low Complexity: 1 Low   PT General Charges $$ ACUTE PT VISIT: 1 Visit         [place], PT Acute Rehab   [place] [name] 05/06/2024, 4:46 PM

## 2024-05-07 ENCOUNTER — Inpatient Hospital Stay (HOSPITAL_COMMUNITY)

## 2024-05-07 DIAGNOSIS — R079 Chest pain, unspecified: Secondary | ICD-10-CM | POA: Diagnosis not present

## 2024-05-07 DIAGNOSIS — K219 Gastro-esophageal reflux disease without esophagitis: Secondary | ICD-10-CM | POA: Diagnosis not present

## 2024-05-07 DIAGNOSIS — D693 Immune thrombocytopenic purpura: Secondary | ICD-10-CM | POA: Diagnosis not present

## 2024-05-07 DIAGNOSIS — N3000 Acute cystitis without hematuria: Secondary | ICD-10-CM

## 2024-05-07 DIAGNOSIS — G9341 Metabolic encephalopathy: Secondary | ICD-10-CM | POA: Diagnosis not present

## 2024-05-07 LAB — BASIC METABOLIC PANEL WITH GFR
Anion gap: 8 (ref 5–15)
BUN: 17 mg/dL (ref 8–23)
CO2: 20 mmol/L — ABNORMAL LOW (ref 22–32)
Calcium: 8.5 mg/dL — ABNORMAL LOW (ref 8.9–10.3)
Chloride: 110 mmol/L (ref 98–111)
Creatinine, Ser: 0.93 mg/dL (ref 0.44–1.00)
GFR, Estimated: >60 mL/min (ref >60)
Glucose, Bld: 115 mg/dL — ABNORMAL HIGH (ref 70–99)
Potassium: 4.1 mmol/L (ref 3.5–5.1)
Sodium: 138 mmol/L (ref 135–145)

## 2024-05-07 LAB — CBC
HCT: 39.4 % (ref 36.0–46.0)
Hemoglobin: 12.6 g/dL (ref 12.0–15.0)
MCH: 29.6 pg (ref 26.0–34.0)
MCHC: 32 g/dL (ref 30.0–36.0)
MCV: 92.7 fL (ref 80.0–100.0)
Platelets: 77 K/uL — ABNORMAL LOW (ref 150–400)
RBC: 4.25 MIL/uL (ref 3.87–5.11)
RDW: 12.9 % (ref 11.5–15.5)
WBC: 3.8 K/uL — ABNORMAL LOW (ref 4.0–10.5)
nRBC: 0 % (ref 0.0–0.2)

## 2024-05-07 LAB — GLUCOSE, CAPILLARY
Glucose-Capillary: 109 mg/dL — ABNORMAL HIGH (ref 70–99)
Glucose-Capillary: 159 mg/dL — ABNORMAL HIGH (ref 70–99)
Glucose-Capillary: 105 mg/dL — ABNORMAL HIGH (ref 70–99)

## 2024-05-07 LAB — PROTIME-INR
INR: 1.1 (ref 0.8–1.2)
Prothrombin Time: 14.6 s (ref 11.4–15.2)

## 2024-05-07 MED ORDER — ACETAMINOPHEN 325 MG PO TABS: 650.0000 mg | ORAL_TABLET | Freq: Four times a day (QID) | ORAL | Status: AC | PRN

## 2024-05-07 MED ORDER — PANTOPRAZOLE SODIUM 40 MG PO TBEC: 40.0000 mg | DELAYED_RELEASE_TABLET | Freq: Every day | ORAL | 0 refills | Status: AC

## 2024-05-07 MED ORDER — ALLOPURINOL 100 MG PO TABS: 100.0000 mg | ORAL_TABLET | Freq: Every day | ORAL | 0 refills | Status: AC

## 2024-05-07 MED ORDER — IOHEXOL 350 MG/ML SOLN
75.0000 mL | Freq: Once | INTRAVENOUS | Status: AC | PRN
  Administered 2024-05-07: 75 mL via INTRAVENOUS

## 2024-05-07 MED ORDER — SODIUM CHLORIDE 0.9 % IV SOLN
1.0000 g | INTRAVENOUS | Status: AC
  Administered 2024-05-07: 1 g via INTRAVENOUS
  Filled 2024-05-07: qty 10

## 2024-05-07 NOTE — Discharge Summary (Signed)
 Physician Discharge Summary  Pamalee Marcoe FMW:[id number] DOB: 1950/07/28 DOA: 05/05/2024  PCP: Pcp, No  Admit date: 05/05/2024 Discharge date: 05/07/2024  Time spent: 60 minutes  Recommendations for Outpatient Follow-up:  Follow-up with PCP in in 1 to 2 weeks.  On follow-up patient's GERD will need to be followed up upon, patient will need CBC, basic metabolic profile done to follow-up on electrolytes and renal function.   Discharge Diagnoses:  Principal Problem:   Acute metabolic encephalopathy Active Problems:   Benign essential HTN   GERD (gastroesophageal reflux disease)   Diabetes mellitus with nephropathy (HCC)   Chest pain   Diabetic neuropathy (HCC)   Chronic idiopathic thrombocytopenia (HCC)   AKI (acute kidney injury)   Acute cystitis without hematuria   Discharge Condition: Stable and improved.  Diet recommendation: Regular  Filed Weights   05/06/24 1623  Weight: 72 kg    History of present illness:  [name] is a 73 y.o. female with medical history significant of diabetes, hypertension, hyperlipidemia, prior stroke who presents emergency department due to altered mental status.  Patient was reportedly acting altered and confused and maybe had taken extra muscle relaxants.  She was brought to the ER where she was found to be afebrile and hemodynamically stable.  Labs were obtained on presentation which showed creatinine 1.2 at baseline, WBC 3.9, hemoglobin 11.1, platelets 85, UDS negative, urinalysis concerning for infection.  Patient underwent CT head which showed no acute findings.  Chest x-ray showed no evidence of infiltrates.  Due to persistent altered mental status patient was given Narcan  with some improvement.  No family was available to provide any collateral information regarding her presentation. On evaluation patient resting comfortable. Arousable but unable to provide hx.   Hospital Course:  #1 acute metabolic encephalopathy/??   UTI -Patient presented with altered mental status per family, concern patient may have taken extra dose of her muscle relaxants which patient endorsed. - Patient also noted with some complaints of urinary frequency and urgency. - Patient noted to have received a dose of Narcan  in the ED. - Urinalysis cloudy, nitrite negative, leukocytes negative, many bacteria, 0-5 WBCs. - Urine cultures with > 100,000 colonies of Staphylococcus epidermis.  - Chest x-ray unremarkable. - Patient with clinical improvement, alert and oriented x 4 and was back to baseline by day of discharge - Patient received 3-day course of empiric IV Rocephin . - Outpatient follow-up with PCP.   2.  Epigastric discomfort likely secondary to GERD/?  Chest pain - Patient with complaints of epigastric discomfort described more as a burning sensation however kept telling nurses that she is having some chest pain with deep inspiration. - GI cocktail ordered with clinical improvement. -Due to complaints of chest pain cardiac enzymes were obtained which were minimally elevated and flattened. -EKG done with no ischemic changes noted. -D-dimer done noted to be elevated. -CT angiogram chest done was negative for PE. -Patient's home regimen PPI was increased from 20 mg daily to 40 mg daily. -Patient placed on GI cocktail as needed. -Patient improved clinically and had no further epigastric discomfort by day of discharge. -Outpatient follow-up with PCP.  3.??  UTI versus bacteriuria -Patient on admission noted to have presented with acute metabolic encephalopathy and UTI noted on differential. -Urine cultures obtained and patient started empirically on IV Rocephin . -Urine cultures with > 100,000 colonies of Staphylococcus epidermidis, preliminary results. -Patient received a 3-day course of IV Rocephin  during the hospitalization. -Patient improved clinically and was back to baseline  by day of discharge. -Outpatient follow-up with  PCP.   4.  Chronic thrombocytopenia -Patient with no overt bleeding. - Outpatient follow-up.    5.  CAD -Patient with some complaints of epigastric pain/chest discomfort. - EKG done with no ischemic changes noted.   - Cardiac enzymes minimally elevated but flattened.   - D-dimer obtained was elevated.   - CT angiogram chest that was negative for PE.   - Patient maintained on GI cocktail as needed.   - Patient maintained on home regimen aspirin  and Plavix .  - See #2. -Outpatient follow-up.   6.  Cognitive impairment - Patient maintained on home regimen Aricept .   7.  Insomnia - Patient placed back on as needed trazodone .   8.  Chronic pain - Patient placed back on home regimen gabapentin .    9.  Hypertension - BP noted to be soft on admission however improved with hydration and holding antihypertensive medications.   - Outpatient follow-up with PCP.    10.  GERD - Patient's PPI was increased to 40 mg daily.   - Patient also placed on GI cocktail as needed.    11.  Diabetes mellitus type 2 - Hemoglobin A1c 5.9. - Patient noted to be on glipizide prior to admission which was held during hospitalization.   - Patient maintained on SSI during the hospitalization.     Procedures: CT head 05/06/2024 Chest x-ray 05/06/2024 CT angiogram chest 05/07/2024  Consultations: None  Discharge Exam: Vitals:   05/07/24 0607 05/07/24 1205  BP: (!) 156/85 135/76  Pulse: 74 77  Resp: 18 19  Temp: 98.2 F (36.8 C) 99.5 F (37.5 C)  SpO2: 100% 100%    General: NAD Cardiovascular: RRR no murmurs rubs or gallops.  No JVD.  No lower extremity edema. Respiratory: Clear to auscultation bilaterally.  No wheezes, no crackles, no rhonchi.  Fair air movement.  Speaking in full sentences.  Discharge Instructions   Discharge Instructions     Diet general   Complete by: As directed    Increase activity slowly   Complete by: As directed       Allergies as of 05/07/2024        Reactions   Nsaids Other (See Comments)   Contraindication due to CKD         Medication List     PAUSE taking these medications    buprenorphine 5 MCG/HR Ptwk Wait to take this until your doctor or other care provider tells you to start again. Commonly known as: BUTRANS Place 1 patch onto the skin once a week.   oxyCODONE -acetaminophen  10-325 MG tablet Wait to take this until your doctor or other care provider tells you to start again. Commonly known as: PERCOCET Take 1 tablet by mouth 4 (four) times daily as needed.   tiZANidine  4 MG tablet Wait to take this until your doctor or other care provider tells you to start again. Commonly known as: ZANAFLEX  Take 4 mg by mouth 2 (two) times daily as needed for muscle spasms.       STOP taking these medications    meloxicam  15 MG tablet Commonly known as: MOBIC        TAKE these medications    acetaminophen  325 MG tablet Commonly known as: TYLENOL  Take 2 tablets (650 mg total) by mouth every 6 (six) hours as needed for mild pain (pain score 1-3) or fever (or Fever >/= 101).   allopurinol  100 MG tablet Commonly known as: ZYLOPRIM  Take 1 tablet (  100 mg total) by mouth daily.   aspirin  81 MG chewable tablet Chew 1 tablet (81 mg total) by mouth daily.   atorvastatin  20 MG tablet Commonly known as: LIPITOR Take 20 mg by mouth daily.   cetirizine  10 MG tablet Commonly known as: ZYRTEC  Take 10 mg by mouth daily.   clopidogrel  75 MG tablet Commonly known as: PLAVIX  Take 75 mg by mouth daily.   diclofenac  sodium 1 % Gel Commonly known as: VOLTAREN  Apply 2 g topically daily as needed (pain).   donepezil  10 MG tablet Commonly known as: ARICEPT  TAKE 1 TABLET BY MOUTH AT BEDTIME What changed: how much to take   feeding supplement (GLUCERNA SHAKE) Liqd Take 237 mLs by mouth 3 (three) times daily between meals.   ferrous sulfate  325 (65 FE) MG tablet Take 1 tablet (325 mg total) by mouth daily with breakfast.    folic acid  1 MG tablet Commonly known as: FOLVITE  Take 1 tablet (1 mg total) by mouth daily.   gabapentin  300 MG capsule Commonly known as: NEURONTIN  Take 300 mg by mouth 3 (three) times daily.   glimepiride  4 MG tablet Commonly known as: AMARYL  Take 4 mg by mouth 2 (two) times daily.   Linzess  145 MCG Caps capsule Generic drug: linaclotide  Take 145 mcg by mouth daily.   losartan  25 MG tablet Commonly known as: Cozaar  Take 1 tablet (25 mg total) by mouth daily.   Mounjaro 5 MG/0.5ML Pen Generic drug: tirzepatide Inject 5 mg into the skin once a week. Thursdays   nortriptyline 25 MG capsule Commonly known as: PAMELOR Take 50 mg by mouth at bedtime.   pantoprazole  40 MG tablet Commonly known as: PROTONIX  Take 1 tablet (40 mg total) by mouth daily. What changed:  medication strength how much to take   SUMAtriptan  50 MG tablet Commonly known as: IMITREX  Take 1 tablet (50 mg total) by mouth daily as needed for migraine or headache.   topiramate  100 MG tablet Commonly known as: TOPAMAX  TAKE 1 AND 1/2 TABLETS BY MOUTH 2 TIMES DAILY   traZODone  50 MG tablet Commonly known as: DESYREL  Take 50 mg by mouth at bedtime.   Ventolin  HFA 108 (90 Base) MCG/ACT inhaler Generic drug: albuterol  1 puff inhaled 1-4 times daily PRN for 30 days   Vitamin D (Ergocalciferol) 1.25 MG (50000 UNIT) Caps capsule Commonly known as: DRISDOL Take 50,000 Units by mouth every 7 (seven) days. Saturdays       Allergies  Allergen Reactions   Nsaids Other (See Comments)    Contraindication due to CKD     Follow-up Information     PCP. Schedule an appointment as soon as possible for a visit in 2 week(s).   Why: Follow-up in 1 to 2 weeks.                 The results of significant diagnostics from this hospitalization (including imaging, microbiology, ancillary and laboratory) are listed below for reference.    Significant Diagnostic Studies: CT Angio Chest Pulmonary  Embolism (PE) W or WO Contrast Result Date: 05/07/2024 EXAM: CTA of the Chest with contrast for PE 05/07/2024 03:14:14 AM TECHNIQUE: CTA of the chest was performed after the administration of intravenous contrast. Multiplanar reformatted images are provided for review. MIP images are provided for review. Automated exposure control, iterative reconstruction, and/or weight based adjustment of the mA/kV was utilized to reduce the radiation dose to as low as reasonably achievable. COMPARISON: None available. CLINICAL HISTORY: Chest pain and shortness  of breath FINDINGS: PULMONARY ARTERIES: The pulmonary artery shows a normal branching pattern without evidence of pulmonary embolism. Main pulmonary artery is normal in caliber. MEDIASTINUM: No cardiac enlargement is seen. The aorta shows a normal enhancement pattern. No aneurysmal dilatation or dissection is noted. The esophagus, as visualized, is within normal limits. LYMPH NODES: No mediastinal, hilar or axillary lymphadenopathy. LUNGS AND PLEURA: The lungs are without acute process. No focal infiltrate or sizable effusion. No parenchymal nodules are noted. No pulmonary edema. No pneumothorax. UPPER ABDOMEN: Limited images of the upper abdomen show changes from prior cholecystectomy. SOFT TISSUES AND BONES: Bony structures are within normal limits. IMPRESSION: 1. No pulmonary embolism. Electronically signed by: Oneil Devonshire MD 05/07/2024 03:32 AM EST RP Workstation: [id number]   CT Head Wo Contrast Result Date: 05/06/2024 EXAM: CT HEAD WITHOUT CONTRAST 05/06/2024 02:36:36 AM TECHNIQUE: CT of the head was performed without the administration of intravenous contrast. Automated exposure control, iterative reconstruction, and/or weight based adjustment of the mA/kV was utilized to reduce the radiation dose to as low as reasonably achievable. COMPARISON: CT head 01/23/2024 CLINICAL HISTORY: Polytrauma, blunt FINDINGS: BRAIN AND VENTRICLES: No acute hemorrhage. No  evidence of acute infarct. No hydrocephalus. No extra-axial collection. No mass effect or midline shift. ORBITS: No acute abnormality. SINUSES: No acute abnormality. SOFT TISSUES AND SKULL: No acute soft tissue abnormality. No skull fracture. IMPRESSION: No acute intracranial abnormality. Electronically signed by: Gilmore Molt MD 05/06/2024 02:52 AM EST RP Workstation: [id number]   DG Knee AP/LAT W/Sunrise Left Result Date: 05/06/2024 EXAM: 3 VIEW(S) XRAY OF THE L KNEE 05/04/2024 10:55:38 AM COMPARISON: None available. CLINICAL HISTORY: L knee pain FINDINGS: BONES AND JOINTS: No acute fracture. No focal osseous lesion. No joint dislocation. No significant joint effusion. Marginal osteophytes. Mild medial compartment joint space narrowing. Patellofemoral joint space narrowing. Small quadriceps tendon enthesophytes. SOFT TISSUES: The soft tissues are unremarkable. IMPRESSION: 1. Mild medial compartment and patellofemoral joint space narrowing with marginal osteophytes, consistent with mild bicompartmental osteoarthritis. 2. Small quadriceps tendon enthesophytes. Electronically signed by: Dorethia Molt MD 05/06/2024 12:26 AM EST RP Workstation: [id number]   DG Chest Portable 1 View Result Date: 05/06/2024 EXAM: 1 VIEW(S) XRAY OF THE CHEST 05/06/2024 12:22:00 AM COMPARISON: 02/07/2023 CLINICAL HISTORY: painetc FINDINGS: LUNGS AND PLEURA: Low lung volumes. No focal pulmonary opacity. No pleural effusion. No pneumothorax. HEART AND MEDIASTINUM: Cardiac loop recorder noted. No acute abnormality of the cardiac and mediastinal silhouettes. BONES AND SOFT TISSUES: No acute osseous abnormality. IMPRESSION: 1. No acute findings. 2. Low lung volumes. Electronically signed by: Dorethia Molt MD 05/06/2024 12:25 AM EST RP Workstation: [id number]    Microbiology: Recent Results (from the past 240 hours)  Urine Culture     Status: Abnormal (Preliminary result)   Collection Time: 05/06/24 12:35 AM   Specimen: Urine,  Clean Catch  Result Value Ref Range Status   Specimen Description   Final    URINE, CLEAN CATCH Performed at [facility], [address], [place], [place] [address]    Special Requests   Final    Normal Performed at [facility], [address], [place], [place] [address]    Culture (A)  Final    >=100,000 COLONIES/mL STAPHYLOCOCCUS EPIDERMIDIS SUSCEPTIBILITIES TO FOLLOW Performed at [facility], [address], [place], [place] [address]    Report Status PENDING  Incomplete     Labs: Basic Metabolic Panel: Recent Labs  Lab 05/04/24 1055 05/06/24 0013 05/07/24 0641  NA 140 141 138  K 4.4 3.5 4.1  CL 105 110 110  CO2 28 23 20*  GLUCOSE 122* 141* 115*  BUN 26* 28* 17  CREATININE 1.39* 1.20* 0.93  CALCIUM  10.5 9.1 8.5*   Liver Function Tests: Recent Labs  Lab 05/04/24 1055 05/06/24 0013  AST 20 20  ALT 34 31  ALKPHOS 67 61  BILITOT 0.7 0.4  PROT 7.2 5.2*  ALBUMIN 3.8 3.1*   No results for input(s): LIPASE, AMYLASE in the last 168 hours. No results for input(s): AMMONIA in the last 168 hours. CBC: Recent Labs  Lab 05/04/24 1055 05/06/24 0013 05/07/24 0641  WBC 4.5 3.9* 3.8*  NEUTROABS 2.0 1.5*  --   HGB 14.3 11.1* 12.6  HCT 42.5 34.6* 39.4  MCV 91.2 95.8 92.7  PLT 145.0* 85* 77*   Cardiac Enzymes: No results for input(s): CKTOTAL, CKMB, CKMBINDEX, TROPONINI in the last 168 hours. BNP: BNP (last 3 results) No results for input(s): BNP in the last 8760 hours.  ProBNP (last 3 results) No results for input(s): PROBNP in the last 8760 hours.  CBG: Recent Labs  Lab 05/05/24 2344 05/06/24 2126 05/07/24 0753 05/07/24 1130  GLUCAP 138* 78 109* 159*       Signed:  Toribio Hummer MD.  [facility] 05/07/2024, 3:51 PM

## 2024-05-08 LAB — URINE CULTURE
Culture: >=100000 — AB
Special Requests: NORMAL

## 2024-05-23 NOTE — Discharge Instructions (Signed)

## 2024-05-24 ENCOUNTER — Inpatient Hospital Stay: Admission: RE | Admit: 2024-05-24 | Discharge: 2024-05-24 | Attending: Family Medicine | Admitting: Family Medicine

## 2024-05-25 ENCOUNTER — Telehealth: Payer: Self-pay | Admitting: Family Medicine

## 2024-05-25 NOTE — Telephone Encounter (Signed)
 Patient scheduled to get her braces fitted. Please verify if this is something for [employer].

## 2024-05-25 NOTE — Telephone Encounter (Signed)
 Spoke with patient, cancelled appt with Dr. Claudene and provided information to schedule with [facility]

## 2024-05-25 NOTE — Telephone Encounter (Signed)
 Does she have the information to schedule with [facility]?

## 2024-06-01 ENCOUNTER — Ambulatory Visit: Admitting: Family Medicine

## 2024-06-22 ENCOUNTER — Ambulatory Visit: Admitting: Family Medicine

## 2024-06-22 NOTE — Discharge Instructions (Signed)

## 2024-06-23 ENCOUNTER — Inpatient Hospital Stay
Admission: RE | Admit: 2024-06-23 | Discharge: 2024-06-23 | Disposition: A | Discharge status: Home / Self Care | Source: Ambulatory Visit | Attending: Family Medicine | Admitting: Family Medicine

## 2024-06-27 NOTE — Discharge Instructions (Signed)

## 2024-06-28 ENCOUNTER — Inpatient Hospital Stay
Admission: RE | Admit: 2024-06-28 | Discharge: 2024-06-28 | Disposition: A | Discharge status: Home / Self Care | Source: Ambulatory Visit | Attending: Family Medicine | Admitting: Family Medicine

## 2024-07-14 ENCOUNTER — Ambulatory Visit: Admitting: Family Medicine
